# Patient Record
Sex: Male | Born: 1960 | Race: White | Hispanic: No | Marital: Single | Smoking: Former smoker
Health system: Southern US, Community
[De-identification: ages and names within clinical notes are randomized; demographics above are authoritative.]

## PROBLEM LIST (undated history)

## (undated) DIAGNOSIS — I209 Angina pectoris, unspecified: Secondary | ICD-10-CM

## (undated) DIAGNOSIS — F3289 Other specified depressive episodes: Secondary | ICD-10-CM

## (undated) DIAGNOSIS — M66329 Spontaneous rupture of flexor tendons, unspecified upper arm: Secondary | ICD-10-CM

## (undated) DIAGNOSIS — F1020 Alcohol dependence, uncomplicated: Secondary | ICD-10-CM

## (undated) DIAGNOSIS — K219 Gastro-esophageal reflux disease without esophagitis: Secondary | ICD-10-CM

## (undated) DIAGNOSIS — R519 Headache, unspecified: Secondary | ICD-10-CM

## (undated) DIAGNOSIS — J309 Allergic rhinitis, unspecified: Secondary | ICD-10-CM

## (undated) DIAGNOSIS — N179 Acute kidney failure, unspecified: Secondary | ICD-10-CM

## (undated) DIAGNOSIS — I2510 Atherosclerotic heart disease of native coronary artery without angina pectoris: Secondary | ICD-10-CM

## (undated) DIAGNOSIS — K7010 Alcoholic hepatitis without ascites: Secondary | ICD-10-CM

## (undated) DIAGNOSIS — D649 Anemia, unspecified: Secondary | ICD-10-CM

## (undated) DIAGNOSIS — Z9861 Coronary angioplasty status: Secondary | ICD-10-CM

## (undated) DIAGNOSIS — I10 Essential (primary) hypertension: Secondary | ICD-10-CM

## (undated) DIAGNOSIS — E785 Hyperlipidemia, unspecified: Secondary | ICD-10-CM

## (undated) DIAGNOSIS — M1990 Unspecified osteoarthritis, unspecified site: Secondary | ICD-10-CM

## (undated) DIAGNOSIS — I5032 Chronic diastolic (congestive) heart failure: Secondary | ICD-10-CM

## (undated) DIAGNOSIS — F411 Generalized anxiety disorder: Secondary | ICD-10-CM

## (undated) DIAGNOSIS — F329 Major depressive disorder, single episode, unspecified: Secondary | ICD-10-CM

## (undated) DIAGNOSIS — R7302 Impaired glucose tolerance (oral): Secondary | ICD-10-CM

## (undated) DIAGNOSIS — D126 Benign neoplasm of colon, unspecified: Secondary | ICD-10-CM

## (undated) HISTORY — DX: Benign neoplasm of colon, unspecified: D12.6

## (undated) HISTORY — DX: Essential (primary) hypertension: I10

## (undated) HISTORY — DX: Spontaneous rupture of flexor tendons, unspecified upper arm: M66.329

## (undated) HISTORY — DX: Major depressive disorder, single episode, unspecified: F32.9

## (undated) HISTORY — DX: Atherosclerotic heart disease of native coronary artery without angina pectoris: I25.10

## (undated) HISTORY — PX: MANDIBLE FRACTURE SURGERY: SHX706

## (undated) HISTORY — DX: Impaired glucose tolerance (oral): R73.02

## (undated) HISTORY — DX: Hyperlipidemia, unspecified: E78.5

## (undated) HISTORY — DX: Coronary angioplasty status: Z98.61

## (undated) HISTORY — DX: Allergic rhinitis, unspecified: J30.9

## (undated) HISTORY — DX: Other specified depressive episodes: F32.89

## (undated) HISTORY — DX: Unspecified osteoarthritis, unspecified site: M19.90

## (undated) HISTORY — DX: Generalized anxiety disorder: F41.1

## (undated) HISTORY — PX: CORONARY ANGIOPLASTY: SHX604

## (undated) HISTORY — PX: FOOT SURGERY: SHX648

## (undated) HISTORY — DX: Alcohol dependence, uncomplicated: F10.20

## (undated) HISTORY — DX: Gastro-esophageal reflux disease without esophagitis: K21.9

---
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
## 1998-10-31 ENCOUNTER — Ambulatory Visit (HOSPITAL_COMMUNITY): Admission: RE | Admit: 1998-10-31 | Discharge: 1998-10-31 | Payer: Self-pay | Admitting: Gastroenterology

## 1999-12-21 ENCOUNTER — Ambulatory Visit (HOSPITAL_COMMUNITY): Admission: RE | Admit: 1999-12-21 | Discharge: 1999-12-21 | Payer: Self-pay | Admitting: Internal Medicine

## 1999-12-21 ENCOUNTER — Encounter: Payer: Self-pay | Admitting: Internal Medicine

## 2004-08-24 ENCOUNTER — Ambulatory Visit: Payer: Self-pay | Admitting: Internal Medicine

## 2004-11-18 ENCOUNTER — Ambulatory Visit: Payer: Self-pay | Admitting: Internal Medicine

## 2004-11-26 ENCOUNTER — Ambulatory Visit: Payer: Self-pay | Admitting: Internal Medicine

## 2004-11-30 ENCOUNTER — Ambulatory Visit: Payer: Self-pay | Admitting: Internal Medicine

## 2006-04-11 ENCOUNTER — Ambulatory Visit: Payer: Self-pay | Admitting: Internal Medicine

## 2006-05-11 ENCOUNTER — Ambulatory Visit: Payer: Self-pay | Admitting: Gastroenterology

## 2006-05-26 ENCOUNTER — Ambulatory Visit: Payer: Self-pay | Admitting: Internal Medicine

## 2006-06-18 DIAGNOSIS — D126 Benign neoplasm of colon, unspecified: Secondary | ICD-10-CM

## 2006-06-18 HISTORY — DX: Benign neoplasm of colon, unspecified: D12.6

## 2006-06-20 ENCOUNTER — Encounter (INDEPENDENT_AMBULATORY_CARE_PROVIDER_SITE_OTHER): Payer: Self-pay | Admitting: Specialist

## 2006-06-20 ENCOUNTER — Ambulatory Visit: Payer: Self-pay | Admitting: Gastroenterology

## 2006-07-18 ENCOUNTER — Ambulatory Visit: Payer: Self-pay | Admitting: Internal Medicine

## 2006-07-18 LAB — CONVERTED CEMR LAB
ALT: 48 units/L — ABNORMAL HIGH (ref 0–40)
AST: 41 units/L — ABNORMAL HIGH (ref 0–37)
Albumin: 3.9 g/dL (ref 3.5–5.2)
Alkaline Phosphatase: 44 units/L (ref 39–117)
Bilirubin, Direct: 0.1 mg/dL (ref 0.0–0.3)
Chol/HDL Ratio, serum: 2.6
Cholesterol: 183 mg/dL (ref 0–200)
HDL: 70 mg/dL (ref >39.0)
LDL Cholesterol: 99 mg/dL (ref 0–99)
Total Bilirubin: 0.7 mg/dL (ref 0.3–1.2)
Total CK: 99 units/L (ref 7–195)
Total Protein: 7.2 g/dL (ref 6.0–8.3)
Triglyceride fasting, serum: 70 mg/dL (ref 0–149)
VLDL: 14 mg/dL (ref 0–40)

## 2007-02-17 ENCOUNTER — Ambulatory Visit: Payer: Self-pay | Admitting: Internal Medicine

## 2007-02-17 LAB — CONVERTED CEMR LAB
ALT: 60 units/L — ABNORMAL HIGH (ref 0–53)
AST: 45 units/L — ABNORMAL HIGH (ref 0–37)
Albumin: 4.3 g/dL (ref 3.5–5.2)
Alkaline Phosphatase: 40 units/L (ref 39–117)
BUN: 68 mg/dL — ABNORMAL HIGH (ref 6–23)
Basophils Absolute: 0 10*3/uL (ref 0.0–0.1)
Basophils Relative: 0.1 % (ref 0.0–1.0)
Bilirubin, Direct: 0.1 mg/dL (ref 0.0–0.3)
CO2: 27 meq/L (ref 19–32)
Calcium: 9.7 mg/dL (ref 8.4–10.5)
Chloride: 100 meq/L (ref 96–112)
Creatinine, Ser: 1.9 mg/dL — ABNORMAL HIGH (ref 0.4–1.5)
Eosinophils Absolute: 0.1 10*3/uL (ref 0.0–0.6)
Eosinophils Relative: 0.9 % (ref 0.0–5.0)
GFR calc Af Amer: 49 mL/min
GFR calc non Af Amer: 40 mL/min
Glucose, Bld: 106 mg/dL — ABNORMAL HIGH (ref 70–99)
H Pylori IgG: NEGATIVE
HCT: 33.8 % — ABNORMAL LOW (ref 39.0–52.0)
Hemoglobin: 12 g/dL — ABNORMAL LOW (ref 13.0–17.0)
Lymphocytes Relative: 14.5 % (ref 12.0–46.0)
MCHC: 35.4 g/dL (ref 30.0–36.0)
MCV: 98.5 fL (ref 78.0–100.0)
Monocytes Absolute: 0.7 10*3/uL (ref 0.2–0.7)
Monocytes Relative: 11.3 % — ABNORMAL HIGH (ref 3.0–11.0)
Neutro Abs: 4.6 10*3/uL (ref 1.4–7.7)
Neutrophils Relative %: 73.2 % (ref 43.0–77.0)
Platelets: 195 10*3/uL (ref 150–400)
Potassium: 4.7 meq/L (ref 3.5–5.1)
RBC: 3.43 M/uL — ABNORMAL LOW (ref 4.22–5.81)
RDW: 12.6 % (ref 11.5–14.6)
Sodium: 136 meq/L (ref 135–145)
Total Bilirubin: 0.8 mg/dL (ref 0.3–1.2)
Total Protein: 7.5 g/dL (ref 6.0–8.3)
WBC: 6.3 10*3/uL (ref 4.5–10.5)

## 2007-02-20 ENCOUNTER — Encounter: Payer: Self-pay | Admitting: Internal Medicine

## 2007-02-21 DIAGNOSIS — M545 Low back pain, unspecified: Secondary | ICD-10-CM | POA: Insufficient documentation

## 2007-02-21 DIAGNOSIS — J309 Allergic rhinitis, unspecified: Secondary | ICD-10-CM | POA: Insufficient documentation

## 2007-02-21 DIAGNOSIS — K219 Gastro-esophageal reflux disease without esophagitis: Secondary | ICD-10-CM | POA: Insufficient documentation

## 2007-02-21 DIAGNOSIS — K649 Unspecified hemorrhoids: Secondary | ICD-10-CM | POA: Insufficient documentation

## 2007-02-21 DIAGNOSIS — R519 Headache, unspecified: Secondary | ICD-10-CM | POA: Insufficient documentation

## 2007-02-21 DIAGNOSIS — Z860100 Personal history of colon polyps, unspecified: Secondary | ICD-10-CM | POA: Insufficient documentation

## 2007-02-21 DIAGNOSIS — M1990 Unspecified osteoarthritis, unspecified site: Secondary | ICD-10-CM | POA: Insufficient documentation

## 2007-02-21 DIAGNOSIS — R51 Headache: Secondary | ICD-10-CM

## 2007-02-21 DIAGNOSIS — F329 Major depressive disorder, single episode, unspecified: Secondary | ICD-10-CM | POA: Insufficient documentation

## 2007-02-21 DIAGNOSIS — F1020 Alcohol dependence, uncomplicated: Secondary | ICD-10-CM

## 2007-02-21 DIAGNOSIS — E785 Hyperlipidemia, unspecified: Secondary | ICD-10-CM

## 2007-02-21 DIAGNOSIS — I10 Essential (primary) hypertension: Secondary | ICD-10-CM

## 2007-11-16 ENCOUNTER — Ambulatory Visit: Payer: Self-pay | Admitting: Internal Medicine

## 2007-11-16 LAB — CONVERTED CEMR LAB
ALT: 50 units/L (ref 0–53)
AST: 37 units/L (ref 0–37)
Albumin: 4.2 g/dL (ref 3.5–5.2)
Alkaline Phosphatase: 48 units/L (ref 39–117)
BUN: 23 mg/dL (ref 6–23)
Basophils Absolute: 0 10*3/uL (ref 0.0–0.1)
Basophils Relative: 0.6 % (ref 0.0–1.0)
Bilirubin Urine: NEGATIVE
Bilirubin, Direct: 0.1 mg/dL (ref 0.0–0.3)
CO2: 33 meq/L — ABNORMAL HIGH (ref 19–32)
Calcium: 10 mg/dL (ref 8.4–10.5)
Chloride: 98 meq/L (ref 96–112)
Cholesterol: 220 mg/dL (ref 0–200)
Creatinine, Ser: 0.9 mg/dL (ref 0.4–1.5)
Direct LDL: 145.1 mg/dL
Eosinophils Absolute: 0.1 10*3/uL (ref 0.0–0.7)
Eosinophils Relative: 1.9 % (ref 0.0–5.0)
GFR calc Af Amer: 115 mL/min
GFR calc non Af Amer: 95 mL/min
Glucose, Bld: 104 mg/dL — ABNORMAL HIGH (ref 70–99)
HCT: 35.4 % — ABNORMAL LOW (ref 39.0–52.0)
HDL: 62.4 mg/dL (ref >39.0)
Hemoglobin, Urine: NEGATIVE
Hemoglobin: 12.4 g/dL — ABNORMAL LOW (ref 13.0–17.0)
Ketones, ur: NEGATIVE mg/dL
Leukocytes, UA: NEGATIVE
Lymphocytes Relative: 20.9 % (ref 12.0–46.0)
MCHC: 34.9 g/dL (ref 30.0–36.0)
MCV: 101.3 fL — ABNORMAL HIGH (ref 78.0–100.0)
Monocytes Absolute: 0.7 10*3/uL (ref 0.1–1.0)
Monocytes Relative: 10.3 % (ref 3.0–12.0)
Neutro Abs: 4.4 10*3/uL (ref 1.4–7.7)
Neutrophils Relative %: 66.3 % (ref 43.0–77.0)
Nitrite: NEGATIVE
PSA: 1.82 ng/mL (ref 0.10–4.00)
Platelets: 179 10*3/uL (ref 150–400)
Potassium: 4.2 meq/L (ref 3.5–5.1)
RBC: 3.5 M/uL — ABNORMAL LOW (ref 4.22–5.81)
RDW: 12.6 % (ref 11.5–14.6)
Sodium: 137 meq/L (ref 135–145)
Specific Gravity, Urine: 1.025 (ref 1.000–1.03)
TSH: 0.78 microintl units/mL (ref 0.35–5.50)
Total Bilirubin: 0.7 mg/dL (ref 0.3–1.2)
Total CHOL/HDL Ratio: 3.5
Total Protein, Urine: NEGATIVE mg/dL
Total Protein: 7.5 g/dL (ref 6.0–8.3)
Triglycerides: 81 mg/dL (ref 0–149)
Urine Glucose: NEGATIVE mg/dL
Urobilinogen, UA: 0.2 (ref 0.0–1.0)
VLDL: 16 mg/dL (ref 0–40)
WBC: 6.6 10*3/uL (ref 4.5–10.5)
pH: 5.5 (ref 5.0–8.0)

## 2008-12-23 ENCOUNTER — Telehealth: Payer: Self-pay | Admitting: Internal Medicine

## 2008-12-23 ENCOUNTER — Ambulatory Visit: Payer: Self-pay | Admitting: Internal Medicine

## 2008-12-24 LAB — CONVERTED CEMR LAB
ALT: 49 units/L (ref 0–53)
AST: 46 units/L — ABNORMAL HIGH (ref 0–37)
Albumin: 4.6 g/dL (ref 3.5–5.2)
Alkaline Phosphatase: 39 units/L (ref 39–117)
BUN: 37 mg/dL — ABNORMAL HIGH (ref 6–23)
Basophils Absolute: 0 10*3/uL (ref 0.0–0.1)
Basophils Relative: 0.1 % (ref 0.0–3.0)
Bilirubin Urine: NEGATIVE
Bilirubin, Direct: 0.1 mg/dL (ref 0.0–0.3)
CO2: 26 meq/L (ref 19–32)
Calcium: 9.6 mg/dL (ref 8.4–10.5)
Chloride: 109 meq/L (ref 96–112)
Cholesterol: 194 mg/dL (ref 0–200)
Creatinine, Ser: 1 mg/dL (ref 0.4–1.5)
Eosinophils Absolute: 0.1 10*3/uL (ref 0.0–0.7)
Eosinophils Relative: 1 % (ref 0.0–5.0)
GFR calc non Af Amer: 84.03 mL/min (ref >60)
Glucose, Bld: 100 mg/dL — ABNORMAL HIGH (ref 70–99)
HCT: 37.4 % — ABNORMAL LOW (ref 39.0–52.0)
HDL: 94.3 mg/dL (ref >39.00)
Hemoglobin, Urine: NEGATIVE
Hemoglobin: 13 g/dL (ref 13.0–17.0)
Ketones, ur: NEGATIVE mg/dL
LDL Cholesterol: 93 mg/dL (ref 0–99)
Leukocytes, UA: NEGATIVE
Lymphocytes Relative: 14.3 % (ref 12.0–46.0)
Lymphs Abs: 0.9 10*3/uL (ref 0.7–4.0)
MCHC: 34.8 g/dL (ref 30.0–36.0)
MCV: 101.9 fL — ABNORMAL HIGH (ref 78.0–100.0)
Monocytes Absolute: 0.5 10*3/uL (ref 0.1–1.0)
Monocytes Relative: 7.5 % (ref 3.0–12.0)
Neutro Abs: 4.8 10*3/uL (ref 1.4–7.7)
Neutrophils Relative %: 77.1 % — ABNORMAL HIGH (ref 43.0–77.0)
Nitrite: NEGATIVE
PSA: 2.43 ng/mL (ref 0.10–4.00)
Platelets: 151 10*3/uL (ref 150.0–400.0)
Potassium: 4.3 meq/L (ref 3.5–5.1)
RBC: 3.67 M/uL — ABNORMAL LOW (ref 4.22–5.81)
RDW: 12.7 % (ref 11.5–14.6)
Sodium: 141 meq/L (ref 135–145)
Specific Gravity, Urine: >=1.03 (ref 1.000–1.030)
TSH: 0.9 microintl units/mL (ref 0.35–5.50)
Total Bilirubin: 0.9 mg/dL (ref 0.3–1.2)
Total CHOL/HDL Ratio: 2
Total Protein: 8.1 g/dL (ref 6.0–8.3)
Triglycerides: 36 mg/dL (ref 0.0–149.0)
Urine Glucose: NEGATIVE mg/dL
Urobilinogen, UA: 0.2 (ref 0.0–1.0)
VLDL: 7.2 mg/dL (ref 0.0–40.0)
WBC: 6.3 10*3/uL (ref 4.5–10.5)
pH: 5.5 (ref 5.0–8.0)

## 2009-04-24 ENCOUNTER — Ambulatory Visit: Payer: Self-pay | Admitting: Internal Medicine

## 2009-04-24 DIAGNOSIS — J069 Acute upper respiratory infection, unspecified: Secondary | ICD-10-CM | POA: Insufficient documentation

## 2009-04-24 DIAGNOSIS — T783XXA Angioneurotic edema, initial encounter: Secondary | ICD-10-CM | POA: Insufficient documentation

## 2009-04-25 ENCOUNTER — Telehealth: Payer: Self-pay | Admitting: Internal Medicine

## 2009-10-30 ENCOUNTER — Ambulatory Visit: Payer: Self-pay | Admitting: Internal Medicine

## 2009-10-30 ENCOUNTER — Encounter (INDEPENDENT_AMBULATORY_CARE_PROVIDER_SITE_OTHER): Payer: Self-pay | Admitting: *Deleted

## 2009-10-30 DIAGNOSIS — K5289 Other specified noninfective gastroenteritis and colitis: Secondary | ICD-10-CM

## 2009-10-30 DIAGNOSIS — F411 Generalized anxiety disorder: Secondary | ICD-10-CM

## 2010-02-18 ENCOUNTER — Ambulatory Visit: Payer: Self-pay | Admitting: Internal Medicine

## 2010-02-19 LAB — CONVERTED CEMR LAB
ALT: 29 units/L (ref 0–53)
AST: 31 units/L (ref 0–37)
Albumin: 4.1 g/dL (ref 3.5–5.2)
Alkaline Phosphatase: 48 units/L (ref 39–117)
Ammonia: 29 umol/L (ref 11–35)
BUN: 21 mg/dL (ref 6–23)
Basophils Absolute: 0 10*3/uL (ref 0.0–0.1)
Basophils Relative: 0.5 % (ref 0.0–3.0)
Bilirubin Urine: NEGATIVE
Bilirubin, Direct: 0.1 mg/dL (ref 0.0–0.3)
CO2: 30 meq/L (ref 19–32)
Calcium: 9.8 mg/dL (ref 8.4–10.5)
Chloride: 94 meq/L — ABNORMAL LOW (ref 96–112)
Cholesterol: 201 mg/dL — ABNORMAL HIGH (ref 0–200)
Creatinine, Ser: 1.1 mg/dL (ref 0.4–1.5)
Direct LDL: 114.3 mg/dL
Eosinophils Absolute: 0.1 10*3/uL (ref 0.0–0.7)
Eosinophils Relative: 0.9 % (ref 0.0–5.0)
GFR calc non Af Amer: 79.06 mL/min (ref >60)
Glucose, Bld: 133 mg/dL — ABNORMAL HIGH (ref 70–99)
HCT: 40.8 % (ref 39.0–52.0)
HDL: 79.9 mg/dL (ref >39.00)
Hemoglobin, Urine: NEGATIVE
Hemoglobin: 14.3 g/dL (ref 13.0–17.0)
Hgb A1c MFr Bld: 5.9 % (ref 4.6–6.5)
Leukocytes, UA: NEGATIVE
Lymphocytes Relative: 16.9 % (ref 12.0–46.0)
Lymphs Abs: 1.1 10*3/uL (ref 0.7–4.0)
MCHC: 34.9 g/dL (ref 30.0–36.0)
MCV: 99.1 fL (ref 78.0–100.0)
Monocytes Absolute: 0.8 10*3/uL (ref 0.1–1.0)
Monocytes Relative: 11.9 % (ref 3.0–12.0)
Neutro Abs: 4.6 10*3/uL (ref 1.4–7.7)
Neutrophils Relative %: 69.8 % (ref 43.0–77.0)
Nitrite: NEGATIVE
PSA: 2.18 ng/mL (ref 0.10–4.00)
Platelets: 145 10*3/uL — ABNORMAL LOW (ref 150.0–400.0)
Potassium: 3.4 meq/L — ABNORMAL LOW (ref 3.5–5.1)
RBC: 4.12 M/uL — ABNORMAL LOW (ref 4.22–5.81)
RDW: 13.4 % (ref 11.5–14.6)
Sodium: 138 meq/L (ref 135–145)
Specific Gravity, Urine: 1.02 (ref 1.000–1.030)
TSH: 0.84 microintl units/mL (ref 0.35–5.50)
Total Bilirubin: 0.7 mg/dL (ref 0.3–1.2)
Total CHOL/HDL Ratio: 3
Total Protein, Urine: NEGATIVE mg/dL
Total Protein: 7.4 g/dL (ref 6.0–8.3)
Triglycerides: 76 mg/dL (ref 0.0–149.0)
Urine Glucose: NEGATIVE mg/dL
Urobilinogen, UA: 0.2 (ref 0.0–1.0)
VLDL: 15.2 mg/dL (ref 0.0–40.0)
WBC: 6.6 10*3/uL (ref 4.5–10.5)
pH: 6.5 (ref 5.0–8.0)

## 2010-06-01 ENCOUNTER — Ambulatory Visit: Payer: Self-pay | Admitting: Internal Medicine

## 2010-06-01 DIAGNOSIS — M66329 Spontaneous rupture of flexor tendons, unspecified upper arm: Secondary | ICD-10-CM | POA: Insufficient documentation

## 2010-06-03 ENCOUNTER — Telehealth: Payer: Self-pay | Admitting: Internal Medicine

## 2010-08-18 NOTE — Assessment & Plan Note (Signed)
Summary: per d/t---fu---stc   Vital Signs:  Patient profile:   50 year old male Height:      67 inches Weight:      178 pounds BMI:     27.98 O2 Sat:      94 % on Room air Temp:     98.7 degrees F oral Pulse rate:   115 / minute BP sitting:   148 / 82  (left arm) Cuff size:   regular  Vitals Entered By: Zella Ball Ewing CMA (AAMA) (February 18, 2010 3:30 PM)  O2 Flow:  Room air  CC: Followup/RE   CC:  Followup/RE.  History of Present Illness: overall doing well, no specific complaints,  Pt denies CP, sob, doe, wheezing, orthopnea, pnd, worsening LE edema, palps, dizziness or syncope Pt denies new neuro symptoms such as headache, facial or extremity weakness  No fever, wt loss, night sweats, loss of appetite or other constitutional symptoms  Overall good compliacne, good tolerability.  Problems Prior to Update: 1)  Anxiety  (ICD-300.00) 2)  Gastroenteritis, Acute  (ICD-558.9) 3)  Angioedema  (ICD-995.1) 4)  Uri  (ICD-465.9) 5)  Preventive Health Care  (ICD-V70.0) 6)  Preventive Health Care  (ICD-V70.0) 7)  Colonic Polyps, Hx of  (ICD-V12.72) 8)  Depression  (ICD-311) 9)  Osteoarthritis  (ICD-715.90) 10)  Low Back Pain  (ICD-724.2) 11)  Headache  (ICD-784.0) 12)  Allergic Rhinitis  (ICD-477.9) 13)  Hemorrhoids  (ICD-455.6) 14)  Dependence, Alcohol Nec/nos, Unspecified  (ICD-303.90) 15)  Gerd  (ICD-530.81) 16)  Hypertension  (ICD-401.9) 17)  Hyperlipidemia  (ICD-272.4)  Medications Prior to Update: 1)  Fluoxetine Hcl 40 Mg Caps (Fluoxetine Hcl) .Marland Kitchen.. 1 By Mouth Once Daily 2)  Losartan Potassium-Hctz 100-25 Mg Tabs (Losartan Potassium-Hctz) .Marland Kitchen.. 1 By Mouth Once Daily 3)  Omeprazole 20 Mg Cpdr (Omeprazole) .Marland Kitchen.. 1 By Mouth Once Daily 4)  Simvastatin 80 Mg Tabs (Simvastatin) .... Take 1 Tablet By Mouth Once A Day 5)  Adult Aspirin Ec Low Strength 81 Mg  Tbec (Aspirin) .Marland Kitchen.. 1po Qd 6)  Promethazine Hcl 25 Mg Tabs (Promethazine Hcl) .Marland Kitchen.. 1 By Mouth Q 6 Hrs As Needed Nausea 7)   Lomotil 2.5-0.025 Mg Tabs (Diphenoxylate-Atropine) .Marland Kitchen.. 1po As Needed Loose Stool  - Max 8 Tabs Per 24 Hours  Current Medications (verified): 1)  Fluoxetine Hcl 40 Mg Caps (Fluoxetine Hcl) .Marland Kitchen.. 1 By Mouth Once Daily 2)  Losartan Potassium-Hctz 100-25 Mg Tabs (Losartan Potassium-Hctz) .Marland Kitchen.. 1 By Mouth Once Daily 3)  Omeprazole 20 Mg Cpdr (Omeprazole) .Marland Kitchen.. 1 By Mouth Once Daily 4)  Simvastatin 40 Mg Tabs (Simvastatin) .Marland Kitchen.. 1po Once Daily 5)  Adult Aspirin Ec Low Strength 81 Mg  Tbec (Aspirin) .Marland Kitchen.. 1po Qd  Allergies (verified): 1)  ! 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         all otherwise negative per pt -  except occasional left earache occasional, and loose stools intermittent every month or so; BP at work has been < 140/90  Physical Exam  Head:  normocephalic and atraumatic.   Eyes:  vision grossly intact, pupils equal, and pupils round.   Ears:  R ear normal and L ear normal.   Nose:  no external deformity and no nasal discharge.   Mouth:  no gingival abnormalities and pharynx pink and moist.   Neck:  supple and no masses.   Lungs:  normal respiratory effort and normal breath sounds.   Heart:  normal rate and regular rhythm.   Abdomen:  soft, non-tender, and normal bowel sounds.   Msk:  no joint tenderness and no joint swelling.   Extremities:  no edema, no erythema  Neurologic:  cranial nerves II-XII intact and strength normal in all extremities.   Skin:  color normal and no rashes.   Psych:  memory intact for recent and remote and normally interactive.     Impression & Recommendations:  Problem # 1:  Preventive Health Care (ICD-V70.0)  Overall doing well, age appropriate education and counseling updated and referral for appropriate preventive services done unless declined, immunizations up to date or declined, diet counseling done if overweight, urged to quit smoking if smokes , most recent labs reviewed and current ordered if appropriate, ecg reviewed or declined (interpretation per ECG scanned in the EMR if done); information regarding Medicare Prevention requirements given if appropriate; speciality referrals updated as appropriate   Orders: TLB-BMP (Basic Metabolic Panel-BMET) (80048-METABOL) TLB-CBC Platelet - w/Differential (85025-CBCD) TLB-Hepatic/Liver Function Pnl (80076-HEPATIC) TLB-TSH (Thyroid Stimulating Hormone) (84443-TSH) TLB-Lipid Panel (80061-LIPID) TLB-PSA (Prostate Specific Antigen) (84153-PSA) TLB-Udip ONLY (81003-UDIP)  Problem # 2:  HYPERTENSION  (ICD-401.9)  His updated medication list for this problem includes:    Losartan Potassium-hctz 100-25 Mg Tabs (Losartan potassium-hctz) .Marland Kitchen... 1 by mouth once daily  BP today: 148/82 Prior BP: 152/90 (10/30/2009)  Labs Reviewed: K+: 4.3 (12/23/2008) Creat: : 1.0 (12/23/2008)   Chol: 194 (12/23/2008)   HDL: 94.30 (12/23/2008)   LDL: 93 (12/23/2008)   TG: 36.0 (12/23/2008) BP overall stable overall by hx and exam, ok to continue meds/tx as is   Complete Medication List: 1)  Fluoxetine Hcl 40 Mg Caps (Fluoxetine hcl) .Marland Kitchen.. 1 by mouth once daily 2)  Losartan Potassium-hctz 100-25 Mg Tabs (Losartan potassium-hctz) .Marland Kitchen.. 1 by mouth once daily 3)  Omeprazole 20 Mg Cpdr (Omeprazole) .Marland Kitchen.. 1 by mouth once daily 4)  Simvastatin 40 Mg Tabs (Simvastatin) .Marland Kitchen.. 1po once daily 5)  Adult Aspirin Ec Low Strength 81 Mg Tbec (Aspirin) .Marland Kitchen.. 1po qd  Other Orders: TLB-Ammonia (82140-AMON)  Patient Instructions: 1)  Continue all previous medications as before this visit , except decrease the simvastatin to 40 mg per day 2)  Please go to the Lab in the basement for your blood and/or urine tests today  3)  Please schedule a follow-up appointment in 1 year. 4)  Check your Blood Pressure regularly. If it  is above 140/90: you should make an appointment. Prescriptions: SIMVASTATIN 40 MG TABS (SIMVASTATIN) 1po once daily  #90 x 3   Entered and Authorized by:   Corwin Levins MD   Signed by:   Corwin Levins MD on 02/18/2010   Method used:   Print then Give to Patient   RxID:   [id number] SIMVASTATIN 80 MG TABS (SIMVASTATIN) Take 1 tablet by mouth once a day  #90 x 3   Entered and Authorized by:   Corwin Levins MD   Signed by:   Corwin Levins MD on 02/18/2010   Method used:   Print then Give to Patient   RxID:   [id number] OMEPRAZOLE 20 MG CPDR (OMEPRAZOLE) 1 by mouth once daily  #90 x 3   Entered and Authorized by:   Corwin Levins MD   Signed by:   Corwin Levins MD on 02/18/2010   Method used:   Print  then Give to Patient   RxID:   [id number] POTASSIUM-HCTZ 100-25 MG TABS (LOSARTAN POTASSIUM-HCTZ) 1 by mouth once daily  #90 x 3   Entered and Authorized by:   Corwin Levins MD   Signed by:   Corwin Levins MD on 02/18/2010   Method used:   Print then Give to Patient   RxID:   [id number] FLUOXETINE HCL 40 MG CAPS (FLUOXETINE HCL) 1 by mouth once daily  #90 x 3   Entered and Authorized by:   Corwin Levins MD   Signed by:   Corwin Levins MD on 02/18/2010   Method used:   Print then Give to Patient   RxID:   [id number]

## 2010-08-18 NOTE — Progress Notes (Signed)
Summary: Ortho referral  Phone Note Outgoing Call   Summary of Call: Dr Jonny Ruiz called pt to inform him of appt 06/04/10 @ 9am Dr Thomasena Edis PA ([facility]) , pt said his company sent him to see Dr Ardith Dark because its workman comp. Initial call taken by: Dagoberto Reef,  June 03, 2010 1:56 PM  Follow-up for Phone Call        noted Follow-up by: Corwin Levins MD,  June 03, 2010 2:57 PM

## 2010-08-18 NOTE — Assessment & Plan Note (Signed)
Summary: LIGHTHEADED/ DIARRHEA/ FATIGUE/NWS   Vital Signs:  Patient profile:   50 year old male Height:      67 inches Weight:      178.50 pounds BMI:     28.06 O2 Sat:      94 % on Room air Temp:     98.5 degrees F oral Pulse rate:   93 / minute BP sitting:   152 / 90  (left arm) Cuff size:   regular  Vitals Entered ByZella Ball Ewing (October 30, 2009 2:34 PM)  O2 Flow:  Room air  CC: light headed, diarrhea, fatigue for 3 days/RE   CC:  light headed, diarrhea, and fatigue for 3 days/RE.  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  /Pt denies CP, sob, doe, wheezing, orthopnea, pnd, worsening LE edema, palps, dizziness or syncope  Pt denies new neuro symptoms such as headache, facial or extremity weakness .  Not dizzy or orthostatic today  Problems Prior to Update: 1)  Anxiety  (ICD-300.00) 2)  Gastroenteritis, Acute  (ICD-558.9) 3)  Angioedema  (ICD-995.1) 4)  Uri  (ICD-465.9) 5)  Preventive Health Care  (ICD-V70.0) 6)  Preventive Health Care  (ICD-V70.0) 7)  Colonic Polyps, Hx of  (ICD-V12.72) 8)  Depression  (ICD-311) 9)  Osteoarthritis  (ICD-715.90) 10)  Low Back Pain  (ICD-724.2) 11)  Headache  (ICD-784.0) 12)  Allergic Rhinitis  (ICD-477.9) 13)  Hemorrhoids  (ICD-455.6) 14)  Dependence, Alcohol Nec/nos, Unspecified  (ICD-303.90) 15)  Gerd  (ICD-530.81) 16)  Hypertension  (ICD-401.9) 17)  Hyperlipidemia  (ICD-272.4)  Medications Prior to Update: 1)  Fluoxetine Hcl 20 Mg Caps  (Fluoxetine Hcl) .... Take 1 Capsule By Mouth Once A Day 2)  Losartan Potassium-Hctz 100-25 Mg Tabs (Losartan Potassium-Hctz) .Marland Kitchen.. 1 By Mouth Once Daily 3)  Omeprazole 20 Mg Cpdr (Omeprazole) .Marland Kitchen.. 1 By Mouth Once Daily 4)  Simvastatin 80 Mg Tabs (Simvastatin) .... Take 1 Tablet By Mouth Once A Day 5)  Adult Aspirin Ec Low Strength 81 Mg  Tbec (Aspirin) .Marland Kitchen.. 1po Qd 6)  Azithromycin 250 Mg Tabs (Azithromycin) .... 2po Qd For 1 Day, Then 1po Qd For 4days, Then Stop 7)  Prednisone 10 Mg Tabs (Prednisone) .... 3po Qd For 3days, Then 2po Qd For 3days, Then 1po Qd For 3days, Then Stop  Current Medications (verified): 1)  Fluoxetine Hcl 40 Mg Caps (Fluoxetine Hcl) .Marland Kitchen.. 1 By Mouth Once Daily 2)  Losartan Potassium-Hctz 100-25 Mg Tabs (Losartan Potassium-Hctz) .Marland Kitchen.. 1 By Mouth Once Daily 3)  Omeprazole 20 Mg Cpdr (Omeprazole) .Marland Kitchen.. 1 By Mouth Once Daily 4)  Simvastatin 80 Mg Tabs (Simvastatin) .... Take 1 Tablet By Mouth Once A Day 5)  Adult Aspirin Ec Low Strength 81 Mg  Tbec (Aspirin) .Marland Kitchen.. 1po Qd 6)  Promethazine Hcl 25 Mg Tabs (Promethazine Hcl) .Marland Kitchen.. 1 By Mouth Q 6 Hrs As Needed Nausea 7)  Lomotil 2.5-0.025 Mg Tabs (Diphenoxylate-Atropine) .Marland Kitchen.. 1po As Needed Loose Stool  - Max 8 Tabs Per  24 Hours  Allergies (verified): 1)  ! 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   Eyes:  vision grossly intact, pupils equal, and pupils round.   Ears:  bilat tm's mild red, sinus nontender Nose:  nasal dischargemucosal pallor and mucosal edema.   Mouth:  pharyngeal erythema and fair dentition.   Neck:  supple and no masses.   Lungs:  normal respiratory effort and normal breath sounds.   Heart:  normal rate and regular rhythm.   Abdomen:  soft and normal bowel sounds., diffuse mild tender without guarding or rebounds Msk:  no joint tenderness and no joint swelling.   Extremities:  no edema, no erythema  Skin:  color normal and no rashes.   Psych:  not depressed appearing and moderately anxious.     Impression & Recommendations:  Problem # 1:  GASTROENTERITIS, ACUTE (ICD-558.9) mild to mod, c/w  viral illness,  for antinausea med and lomotl as needed, push fluids, tylenol as needed   Problem # 2:  ANXIETY (ICD-300.00)  His updated medication list for this problem includes:    Fluoxetine Hcl 40 Mg Caps (Fluoxetine hcl) .Marland Kitchen... 1 by mouth once daily to increase the prozac as above, f/u next visit or 4 wks if not improved, sooner if wore  Problem # 3:  HYPERTENSION (ICD-401.9)  His updated medication list for this problem includes:    Losartan Potassium-hctz 100-25 Mg Tabs (Losartan potassium-hctz) .Marland Kitchen... 1 by mouth once daily  BP today: 152/90 Prior BP: 132/68 (04/24/2009)  Labs Reviewed: K+: 4.3 (12/23/2008) Creat: : 1.0 (12/23/2008)   Chol: 194 (12/23/2008)   HDL: 94.30 (12/23/2008)   LDL: 93 (12/23/2008)   TG: 36.0 (12/23/2008) mild elev today, likely situational, ok to follow, continue same treatment   Complete Medication List: 1)  Fluoxetine Hcl 40 Mg Caps (Fluoxetine hcl) .Marland Kitchen.. 1 by mouth once daily 2)  Losartan Potassium-hctz 100-25 Mg Tabs (Losartan potassium-hctz) .Marland Kitchen.. 1 by mouth once daily 3)  Omeprazole 20 Mg Cpdr (Omeprazole) .Marland Kitchen.. 1 by mouth once daily 4)  Simvastatin 80 Mg Tabs  (Simvastatin) .... Take 1 tablet by mouth once a day 5)  Adult Aspirin Ec Low Strength 81 Mg Tbec (Aspirin) .Marland Kitchen.. 1po qd 6)  Promethazine Hcl 25 Mg Tabs (Promethazine hcl) .Marland Kitchen.. 1 by mouth q 6 hrs as needed nausea 7)  Lomotil 2.5-0.025 Mg Tabs (Diphenoxylate-atropine) .Marland Kitchen.. 1po as needed loose stool  - max 8 tabs per 24 hours  Patient Instructions: 1)  Please take all new medications as prescribed 2)  Continue all previous medications as before this visit, except increase the fluoxetine to 40 mg per day  3)  all of your medications were sent to the pharmacy except for the lomotil 4)  drink lots of fluids 5)  Please schedule a follow-up appointment in 2 months with CPX labs Prescriptions: LOMOTIL 2.5-0.025 MG  TABS (DIPHENOXYLATE-ATROPINE) 1po as needed loose stool  - max 8 tabs per 24 hours  #50 x 0   Entered and Authorized by:   Corwin Levins MD   Signed by:   Corwin Levins MD on 10/30/2009   Method used:   Print then Give to Patient   RxID:   [id number] PROMETHAZINE HCL 25 MG TABS (PROMETHAZINE HCL) 1 by mouth q 6 hrs as needed nausea  #50 x 1   Entered and Authorized by:   Corwin Levins MD   Signed by:   Corwin Levins MD on 10/30/2009   Method used:   Electronically to        [facility] [address] (retail)       [phone]       [place], [place]  [address]       Ph: [phone]       Fax: [phone]   RxID:   [id number] FLUOXETINE HCL 40 MG CAPS (FLUOXETINE HCL) 1 by mouth once daily  #90 x 3   Entered and Authorized by:   Corwin Levins MD   Signed by:   Corwin Levins MD on 10/30/2009   Method used:   Electronically to        [facility] [address] (retail)       [phone]       [place], [place]  [address]       Ph: [phone]       Fax: [phone]   RxID:   [id number]

## 2010-08-18 NOTE — Letter (Signed)
Summary: Out of Work  [facility]  [address] [place], [place] [address]   Phone: [phone]  Fax: [phone]    October 30, 2009   Employee:  THANIEL COLUCCIO    To Whom It May Concern:   For Medical reasons, please excuse the above named employee from work for the following dates:  Start:   10/28/2009  End:   10/30/2009  If you need additional information, please feel free to contact our office.         Sincerely,    Dr. Oliver Barre

## 2010-08-18 NOTE — Letter (Signed)
Summary: Out of Work  [facility]  [address] [place], [place] [address]   Phone: [phone]  Fax: [phone]    June 01, 2010   Employee:  BYAN POPLASKI    To Whom It May Concern:   For Medical reasons, please excuse the above named employee from work for the following dates:  Start:   Jun 01, 2010  End:   Jun 10, 2010 -  to return to work Jun 11, 2010  If you need additional information, please feel free to contact our office.         Sincerely,    Corwin Levins MD

## 2010-08-18 NOTE — Assessment & Plan Note (Signed)
Summary: BLACK AND BLUE HURT ARM TURNING MATTRESS-STC   Vital Signs:  Patient profile:   50 year old male Height:      67 inches Weight:      183.25 pounds BMI:     28.80 O2 Sat:      95 % on Room air Temp:     98.2 degrees F oral Pulse rate:   77 / minute BP sitting:   164 / 92  (left arm) Cuff size:   regular  Vitals Entered By: Zella Ball Ewing CMA Duncan Dull) (June 01, 2010 2:38 PM)  O2 Flow:  Room air  CC: Right arm pain/RE   CC:  Right arm pain/RE.  History of Present Illness: here with acute onset pain to the right shoudler and bicep swelling, starting with a popping noise near the shoudler on turning over a matterss at home; then shortly afterwards with persistnet mild to mod ant upper arm pain with very large bruising.  Shoulder without significant pain on ROM now but c/o some weakness, and unable to go to work since his job involves lifitng constantly, and no light duty available.  Pt denies CP, worsening sob, doe, wheezing, orthopnea, pnd, worsening LE edema, palps, dizziness or syncope  Pt denies new neuro symptoms such as headache, facial or extremity weakness  No fever, wt loss, night sweats, loss of appetite or other constitutional symptoms  Denies worsening depressive symptoms, suicidal ideation, or panic.  Overall good complaicne with meds, and good tolerance.  Preventive Screening-Counseling & Management      Drug Use:  no.    Problems Prior to Update: 1)  Biceps Tendon Rupture, Right  (ICD-727.62) 2)  Anxiety  (ICD-300.00) 3)  Gastroenteritis, Acute  (ICD-558.9) 4)  Angioedema  (ICD-995.1) 5)  Uri  (ICD-465.9) 6)  Preventive Health Care  (ICD-V70.0) 7)  Preventive Health Care  (ICD-V70.0) 8)  Colonic Polyps, Hx of  (ICD-V12.72) 9)  Depression  (ICD-311) 10)  Osteoarthritis  (ICD-715.90) 11)  Low Back Pain  (ICD-724.2) 12)  Headache  (ICD-784.0) 13)  Allergic Rhinitis  (ICD-477.9) 14)  Hemorrhoids  (ICD-455.6) 15)  Dependence, Alcohol Nec/nos, Unspecified   (ICD-303.90) 16)  Gerd  (ICD-530.81) 17)  Hypertension  (ICD-401.9) 18)  Hyperlipidemia  (ICD-272.4)  Medications Prior to Update: 1)  Fluoxetine Hcl 40 Mg Caps (Fluoxetine Hcl) .Marland Kitchen.. 1 By Mouth Once Daily 2)  Losartan Potassium-Hctz 100-25 Mg Tabs (Losartan Potassium-Hctz) .Marland Kitchen.. 1 By Mouth Once Daily 3)  Omeprazole 20 Mg Cpdr (Omeprazole) .Marland Kitchen.. 1 By Mouth Once Daily 4)  Simvastatin 40 Mg Tabs (Simvastatin) .Marland Kitchen.. 1po Once Daily 5)  Adult Aspirin Ec Low Strength 81 Mg  Tbec (Aspirin) .Marland Kitchen.. 1po Qd  Current Medications (verified): 1)  Fluoxetine Hcl 40 Mg Caps (Fluoxetine Hcl) .Marland Kitchen.. 1 By Mouth Once Daily 2)  Losartan Potassium-Hctz 100-25 Mg Tabs (Losartan Potassium-Hctz) .Marland Kitchen.. 1 By Mouth Once Daily 3)  Omeprazole 20 Mg Cpdr (Omeprazole) .Marland Kitchen.. 1 By Mouth Once Daily 4)  Simvastatin 40 Mg Tabs (Simvastatin) .Marland Kitchen.. 1po Once Daily 5)  Adult Aspirin Ec Low Strength 81 Mg  Tbec (Aspirin) .Marland Kitchen.. 1po Qd  Allergies (verified): 1)  ! 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   Head:   normocephalic and atraumatic.   Eyes:  vision grossly intact, pupils equal, and pupils round.   Ears:  R ear normal and L ear normal.   Nose:  no external deformity and no nasal discharge.   Mouth:  no gingival abnormalities and pharynx pink and moist.   Neck:  supple and no masses.   Lungs:  normal respiratory effort and normal breath sounds.   Heart:  normal rate and regular rhythm.   Msk:  right shoudler nontender with FROM; some bicep insertion site tender but has unusual right bicep area swollen adn very large echymotic area covering nearly the whole medial arm from shoulder to axilla Extremities:  no edema, no erythema    Impression & Recommendations:  Problem # 1:  BICEPS TENDON RUPTURE, RIGHT (ICD-727.62) pain tolerable per pt;  for referral to ortho for furhter eval and tx, may need MRI arm but will hold for now;  no lifting at work - gave note for off work for 10 days Orders: Investment banker, operational Referral Pharmacist, hospital)  Problem # 2:  HYPERTENSION (ICD-401.9)  His updated medication list for this problem includes:    Losartan Potassium-hctz 100-25 Mg Tabs (Losartan potassium-hctz) .Marland Kitchen... 1 by mouth once daily  BP today: 164/92 Prior BP: 148/82 (02/18/2010)  Labs Reviewed: K+: 3.4 (02/18/2010) Creat: : 1.1 (02/18/2010)   Chol: 201 (02/18/2010)   HDL: 79.90 (02/18/2010)   LDL: 93 (12/23/2008)   TG: 76.0 (02/18/2010) mild elev today, likely situational, ok to follow, continue same treatment   Problem # 3:  ANXIETY (ICD-300.00)  His updated medication list for this problem includes:    Fluoxetine Hcl 40 Mg Caps (Fluoxetine hcl) .Marland Kitchen... 1 by mouth once daily stable overall by hx and exam, ok to continue meds/tx as is   Complete Medication List: 1)  Fluoxetine Hcl 40 Mg Caps (Fluoxetine hcl) .Marland Kitchen.. 1 by mouth once daily 2)  Losartan Potassium-hctz 100-25 Mg Tabs (Losartan potassium-hctz) .Marland Kitchen.. 1 by mouth once daily 3)  Omeprazole 20 Mg Cpdr (Omeprazole) .Marland Kitchen.. 1 by mouth once  daily 4)  Simvastatin 40 Mg Tabs (Simvastatin) .Marland Kitchen.. 1po once daily 5)  Adult Aspirin Ec Low Strength 81 Mg Tbec (Aspirin) .Marland Kitchen.. 1po qd  Patient Instructions: 1)  You will be contacted about the referral(s) to: orthopedic 2)  Continue all previous medications as before this visit 3)  you are given the note for off work for 10 days 4)  Please schedule a follow-up appointment in August 2012 with CPX and labs 5)  Remember you will probably need a new out of work note per orhtopedice if you are supposed to be out of work longer than 10 days   Orders Added: 1)  Orthopedic Surgeon Referral [Ortho Surgeon] 2)  Est. Patient Level IV [16109]

## 2010-12-04 NOTE — Assessment & Plan Note (Signed)
[facility]                           GASTROENTEROLOGY OFFICE NOTE   Richard Calhoun, Richard Calhoun                        MRN:          [id number]  DATE:05/11/2006                            DOB:          [birth year]    REFERRING PHYSICIAN:  Corwin Levins, MD   REASON FOR REFERRAL:  Hematochezia and rectal pain.   HISTORY OF PRESENT ILLNESS:  [name] is a 50 year old white male referred  from the courtesy of Dr. Oliver Barre.  I have previously evaluated him and he  underwent colonoscopy in July 2001 for rectal bleeding.  Internal and  external hemorrhoids were noted and his internal hemorrhoids were injected  for destruction.  He states he has done quite well with no colorectal  complaints up until about the past 2 to 3 weeks.  He states he was on  antibiotics which led to diarrhea and then he noted small amounts of rectal  bleeding with rectal pain associated with bowel movements.  His symptoms  have since then improved.  He has had no change in stool caliber, abdominal  pain or weight loss.  He does have problems with reflux but his symptoms are  well controlled on Nexium.  He notes no dysphagia or odynophagia.  There is  no family history of colon cancer, colon polyps or inflammatory bowel  disease.   PAST MEDICAL HISTORY:  1. Hypertension.  2. Hyperlipidemia.  3. Internal and external hemorrhoids.  4. Arthritis.  5. Allergies.  6. History of recurrent lower back pain.  7. GERD.  8. History of foot surgery.  9. History of jaw surgery.   ALLERGIES:  PENICILLIN.   MEDICATIONS:  Listed in the chart and they were reviewed.   SOCIAL HISTORY:  Per the handwritten form.   REVIEW OF SYSTEMS:  Per the handwritten form.   PHYSICAL EXAM:  No acute distress. Height 5 feet 7 inches.  Weight 182  pounds.  Blood pressure is 100/54.  Pulse 100 and regular.  HEENT:  Anicteric sclerae.  Oropharynx clear.  CHEST:  Clear to auscultation bilaterally.  CARDIAC:  Regular rate and rhythm without murmur.  ABDOMEN:  Soft, nontender, nondistended.  Normoactive bowel sounds.  No  palpable organomegaly, masses or hernias.  RECTAL EXAMINATION:  Reveals external hemorrhoids, minor anal canal  tenderness and Hemoccult negative brown stool in the vault.  NEUROLOGIC:  Alert and oriented x3.  Grossly nonfocal.   ASSESSMENT AND PLAN:  Rectal bleeding with associated rectal pain.  I  suspect he has recurrent hemorrhoidal symptoms.  External hemorrhoids noted  on exam.  Rule out colorectal neoplasms, proctitis and internal hemorrhoids.  Begin Analpram 2.5% cream to use b.i.d. as needed and begin rectal care  instructions.  Risks, benefits, and alternatives to colonoscopy with  possible biopsy, possible polypectomy, and possible destruction of internal  hemorrhoids discussed with the patient and he consents to proceed.  This  will be scheduled electively.     Venita Lick. Russella Dar, MD, [facility]  Electronically Signed    MTS/MedQ  DD: 05/17/2006  DT: 05/17/2006  Job #: [id number]  cc:   Corwin Levins, MD

## 2011-01-13 ENCOUNTER — Other Ambulatory Visit: Payer: Self-pay

## 2011-01-13 MED ORDER — FLUOXETINE HCL 40 MG PO CAPS: 40.0000 mg | ORAL_CAPSULE | Freq: Every day | ORAL | Status: DC

## 2011-03-31 ENCOUNTER — Telehealth: Payer: Self-pay

## 2011-03-31 DIAGNOSIS — Z1289 Encounter for screening for malignant neoplasm of other sites: Secondary | ICD-10-CM

## 2011-03-31 DIAGNOSIS — Z0000 Encounter for general adult medical examination without abnormal findings: Secondary | ICD-10-CM

## 2011-03-31 NOTE — Telephone Encounter (Signed)
Put order in for physical labs. 

## 2011-04-26 ENCOUNTER — Other Ambulatory Visit: Payer: Self-pay | Admitting: Internal Medicine

## 2011-05-16 ENCOUNTER — Encounter: Payer: Self-pay | Admitting: Internal Medicine

## 2011-05-16 DIAGNOSIS — Z0000 Encounter for general adult medical examination without abnormal findings: Secondary | ICD-10-CM | POA: Insufficient documentation

## 2011-05-18 ENCOUNTER — Other Ambulatory Visit: Payer: Self-pay | Admitting: Internal Medicine

## 2011-05-18 ENCOUNTER — Other Ambulatory Visit (INDEPENDENT_AMBULATORY_CARE_PROVIDER_SITE_OTHER): Payer: BC Managed Care – PPO

## 2011-05-18 ENCOUNTER — Encounter: Payer: Self-pay | Admitting: Internal Medicine

## 2011-05-18 ENCOUNTER — Ambulatory Visit (INDEPENDENT_AMBULATORY_CARE_PROVIDER_SITE_OTHER): Payer: BC Managed Care – PPO | Admitting: Internal Medicine

## 2011-05-18 VITALS — BP 160/82 | HR 96 | Temp 98.2°F | Ht 67.0 in | Wt 185.5 lb

## 2011-05-18 DIAGNOSIS — Z86010 Personal history of colonic polyps: Secondary | ICD-10-CM

## 2011-05-18 DIAGNOSIS — R7302 Impaired glucose tolerance (oral): Secondary | ICD-10-CM | POA: Insufficient documentation

## 2011-05-18 DIAGNOSIS — R7309 Other abnormal glucose: Secondary | ICD-10-CM

## 2011-05-18 DIAGNOSIS — J209 Acute bronchitis, unspecified: Secondary | ICD-10-CM | POA: Insufficient documentation

## 2011-05-18 DIAGNOSIS — Z0000 Encounter for general adult medical examination without abnormal findings: Secondary | ICD-10-CM

## 2011-05-18 DIAGNOSIS — Z23 Encounter for immunization: Secondary | ICD-10-CM

## 2011-05-18 LAB — HEPATIC FUNCTION PANEL
AST: 58 U/L — ABNORMAL HIGH (ref 0–37)
Albumin: 4.3 g/dL (ref 3.5–5.2)
Alkaline Phosphatase: 73 U/L (ref 39–117)
Bilirubin, Direct: 0.1 mg/dL (ref 0.0–0.3)
Total Protein: 8.2 g/dL (ref 6.0–8.3)

## 2011-05-18 LAB — CBC WITH DIFFERENTIAL/PLATELET
Basophils Absolute: 0 10*3/uL (ref 0.0–0.1)
Eosinophils Absolute: 0 10*3/uL (ref 0.0–0.7)
HCT: 43 % (ref 39.0–52.0)
Lymphocytes Relative: 16.6 % (ref 12.0–46.0)
Lymphs Abs: 1 10*3/uL (ref 0.7–4.0)
MCHC: 34.2 g/dL (ref 30.0–36.0)
Monocytes Relative: 10.5 % (ref 3.0–12.0)
Platelets: 153 10*3/uL (ref 150.0–400.0)
RDW: 13.2 % (ref 11.5–14.6)

## 2011-05-18 LAB — BASIC METABOLIC PANEL
CO2: 27 mEq/L (ref 19–32)
Chloride: 99 mEq/L (ref 96–112)
Glucose, Bld: 100 mg/dL — ABNORMAL HIGH (ref 70–99)
Potassium: 3.2 mEq/L — ABNORMAL LOW (ref 3.5–5.1)
Sodium: 137 mEq/L (ref 135–145)

## 2011-05-18 LAB — TSH: TSH: 1.29 u[IU]/mL (ref 0.35–5.50)

## 2011-05-18 LAB — URINALYSIS, ROUTINE W REFLEX MICROSCOPIC
Hgb urine dipstick: NEGATIVE
Ketones, ur: NEGATIVE
Leukocytes, UA: NEGATIVE
Specific Gravity, Urine: 1.025 (ref 1.000–1.030)
Urine Glucose: NEGATIVE
Urobilinogen, UA: 0.2 (ref 0.0–1.0)

## 2011-05-18 LAB — LDL CHOLESTEROL, DIRECT: Direct LDL: 154.4 mg/dL

## 2011-05-18 LAB — PSA: PSA: 2.12 ng/mL (ref 0.10–4.00)

## 2011-05-18 MED ORDER — SIMVASTATIN 40 MG PO TABS: 40.0000 mg | ORAL_TABLET | Freq: Every day | ORAL | Status: DC

## 2011-05-18 MED ORDER — AZITHROMYCIN 250 MG PO TABS: ORAL_TABLET | ORAL | Status: AC

## 2011-05-18 MED ORDER — LOSARTAN POTASSIUM-HCTZ 100-25 MG PO TABS: 1.0000 | ORAL_TABLET | Freq: Every day | ORAL | Status: DC

## 2011-05-18 MED ORDER — FLUOXETINE HCL 40 MG PO CAPS: 40.0000 mg | ORAL_CAPSULE | Freq: Every day | ORAL | Status: DC

## 2011-05-18 NOTE — Assessment & Plan Note (Signed)
asympt   - for a1c today  

## 2011-05-18 NOTE — Progress Notes (Signed)
Subjective:    Patient ID: Richard Calhoun, male    DOB: 06/10/1961, 50 y.o.   MRN: [id number]  HPI  Here for wellness and f/u;  Overall doing ok;  Pt denies CP, worsening SOB, DOE, wheezing, orthopnea, PND, worsening LE edema, palpitations, dizziness or syncope, though Here with acute onset mild to mod 2-3 days ST, HA, general weakness and malaise, with prod cough greenish sputum.  Pt denies neurological change such as new Headache, facial or extremity weakness.  Pt denies polydipsia, polyuria, or low sugar symptoms. Pt states overall good compliance with treatment and medications, good tolerability, and trying to follow lower cholesterol diet.  Pt denies worsening depressive symptoms, suicidal ideation or panic. No fever, wt loss, night sweats, loss of appetite, or other constitutional symptoms.  Pt states good ability with ADL's, low fall risk, home safety reviewed and adequate, no significant changes in hearing or vision, and occasionally active with exercise.  Still with significant etoh use/dependence. Here with acute onset mild to mod 2-3 days ST, HA, general weakness and malaise, with prod cough greenish sputum. Past Medical History  Diagnosis Date  . Impaired glucose tolerance 05/18/2011  . DEPENDENCE, ALCOHOL NEC/NOS, UNSPECIFIED 02/21/2007  . HYPERLIPIDEMIA 02/21/2007  . ANXIETY 10/30/2009  . DEPRESSION 02/21/2007  . HYPERTENSION 02/21/2007  . ALLERGIC RHINITIS 02/21/2007  . GERD 02/21/2007  . OSTEOARTHRITIS 02/21/2007  . BICEPS TENDON RUPTURE, RIGHT 06/01/2010  . COLONIC POLYPS, HX OF 02/21/2007   No past surgical history on file.  reports that he has been smoking.  He does not have any smokeless tobacco history on file. His alcohol and drug histories not on file. family history is not on file. Allergies  Allergen Reactions  . Penicillins    No current outpatient prescriptions on file prior to visit.    Review of Systems Review of Systems  Constitutional: Negative for diaphoresis, activity  change, appetite change and unexpected weight change.  HENT: Negative for hearing loss, ear pain, facial swelling, mouth sores and neck stiffness.   Eyes: Negative for pain, redness and visual disturbance.  Respiratory: Negative for shortness of breath and wheezing.   Cardiovascular: Negative for chest pain and palpitations.  Gastrointestinal: Negative for diarrhea, blood in stool, abdominal distention and rectal pain.  Genitourinary: Negative for hematuria, flank pain and decreased urine volume.  Musculoskeletal: Negative for myalgias and joint swelling.  Skin: Negative for color change and wound.  Neurological: Negative for syncope and numbness.  Hematological: Negative for adenopathy.  Psychiatric/Behavioral: Negative for hallucinations, self-injury, decreased concentration and agitation.     Objective:   Physical Exam BP 160/82  Pulse 96  Temp(Src) 98.2 F (36.8 C) (Oral)  Ht 5\' 7"  (1.702 m)  Wt 185 lb 8 oz (84.142 kg)  BMI 29.05 kg/m2  SpO2 93% Physical Exam  VS noted, mild ill appearing Constitutional: Pt is oriented to person, place, and time. Appears well-developed and well-nourished.  HENT:  Head: Normocephalic and atraumatic.  Right Ear: External ear normal.  Left Ear: External ear normal.  Nose: Nose normal.  Mouth/Throat: Oropharynx is clear and moist. Bilat tm's mild erythema.  Sinus nontender.  Pharynx mild erythema  Eyes: Conjunctivae and EOM are normal. Pupils are equal, round, and reactive to light.  Neck: Normal range of motion. Neck supple. No JVD present. No tracheal deviation present.  Cardiovascular: Normal rate, regular rhythm, normal heart sounds and intact distal pulses.   Pulmonary/Chest: Effort normal and breath sounds normal.  Abdominal: Soft. Bowel sounds are normal. There  is no tenderness.  Musculoskeletal: Normal range of motion. Exhibits no edema.  Lymphadenopathy:  Has no cervical adenopathy.  Neurological: Pt is alert and oriented to person,  place, and time. Pt has normal reflexes. No cranial nerve deficit.  Skin: Skin is warm and dry. No rash noted.  Psychiatric:  Has  normal mood and affect. Behavior is normal.     Assessment & Plan:

## 2011-05-18 NOTE — Assessment & Plan Note (Signed)
Mild to mod, for antibx course,  to f/u any worsening symptoms or concerns 

## 2011-05-18 NOTE — Patient Instructions (Signed)
You had the tetanus shot today You will be contacted regarding the referral for: colonoscopy Please go to LAB in the Basement for the blood and/or urine tests to be done today Please call the phone number [phone] (the PhoneTree System) for results of testing in 2-3 days;  When calling, simply dial the number, and when prompted enter the MRN number above (the Medical Record Number) and the # key, then the message should start. Take all new medications as prescribed Continue all other medications as before Please return in 1 year for your yearly visit, or sooner if needed, with Lab testing done 3-5 days before

## 2011-05-18 NOTE — Assessment & Plan Note (Signed)
Due for colonoscopy - will arrange

## 2011-05-18 NOTE — Assessment & Plan Note (Addendum)
Overall doing well, age appropriate education and counseling updated, referrals for preventative services and immunizations addressed, dietary and smoking counseling addressed, most recent labs and ECG reviewed.  I have personally reviewed and have noted: 1) the patient's medical and social history 2) The pt's use of alcohol, tobacco, and illicit drugs 3) The patient's current medications and supplements 4) Functional ability including ADL's, fall risk, home safety risk, hearing and visual impairment 5) Diet and physical activities 6) Evidence for depression or mood disorder 7) The patient's height, weight, and BMI have been recorded in the chart I have made referrals, and provided counseling and education based on review of the above Declines flu shot, all med refills done, ECG reviewed as per emr

## 2011-05-20 ENCOUNTER — Encounter: Payer: Self-pay | Admitting: Internal Medicine

## 2011-06-07 ENCOUNTER — Encounter: Payer: Self-pay | Admitting: Internal Medicine

## 2011-07-30 ENCOUNTER — Encounter: Payer: Self-pay | Admitting: Gastroenterology

## 2012-04-18 ENCOUNTER — Encounter: Payer: Self-pay | Admitting: Gastroenterology

## 2012-05-02 ENCOUNTER — Telehealth: Payer: Self-pay | Admitting: Internal Medicine

## 2012-05-02 NOTE — Telephone Encounter (Signed)
The patient called and has scheduled a cpe for January.  He is hoping to get his medicine refilled to last until this apt.

## 2012-05-02 NOTE — Telephone Encounter (Signed)
Called left message to call back 

## 2012-05-02 NOTE — Telephone Encounter (Signed)
Called the patient left message to call back 

## 2012-05-03 ENCOUNTER — Other Ambulatory Visit: Payer: Self-pay

## 2012-05-03 MED ORDER — LOSARTAN POTASSIUM-HCTZ 100-25 MG PO TABS: 1.0000 | ORAL_TABLET | Freq: Every day | ORAL | Status: DC

## 2012-05-03 MED ORDER — OMEPRAZOLE 20 MG PO CPDR: 20.0000 mg | DELAYED_RELEASE_CAPSULE | Freq: Every day | ORAL | Status: DC

## 2012-05-03 MED ORDER — FLUOXETINE HCL 40 MG PO CAPS: 40.0000 mg | ORAL_CAPSULE | Freq: Every day | ORAL | Status: DC

## 2012-05-03 MED ORDER — SIMVASTATIN 40 MG PO TABS: 40.0000 mg | ORAL_TABLET | Freq: Every day | ORAL | Status: DC

## 2012-05-04 NOTE — Telephone Encounter (Signed)
Patient has been notified and prescriptions needed sent in.

## 2012-08-01 ENCOUNTER — Encounter: Payer: BC Managed Care – PPO | Admitting: Internal Medicine

## 2012-08-03 ENCOUNTER — Encounter: Payer: BC Managed Care – PPO | Admitting: Internal Medicine

## 2012-08-29 ENCOUNTER — Ambulatory Visit (INDEPENDENT_AMBULATORY_CARE_PROVIDER_SITE_OTHER): Payer: BC Managed Care – PPO | Admitting: Internal Medicine

## 2012-08-29 ENCOUNTER — Encounter: Payer: Self-pay | Admitting: Internal Medicine

## 2012-08-29 ENCOUNTER — Other Ambulatory Visit (INDEPENDENT_AMBULATORY_CARE_PROVIDER_SITE_OTHER): Payer: BC Managed Care – PPO

## 2012-08-29 VITALS — BP 140/84 | HR 110 | Temp 97.9°F | Ht 67.0 in | Wt 193.5 lb

## 2012-08-29 DIAGNOSIS — I10 Essential (primary) hypertension: Secondary | ICD-10-CM

## 2012-08-29 DIAGNOSIS — R7302 Impaired glucose tolerance (oral): Secondary | ICD-10-CM

## 2012-08-29 DIAGNOSIS — Z0000 Encounter for general adult medical examination without abnormal findings: Secondary | ICD-10-CM

## 2012-08-29 DIAGNOSIS — K219 Gastro-esophageal reflux disease without esophagitis: Secondary | ICD-10-CM

## 2012-08-29 DIAGNOSIS — R7309 Other abnormal glucose: Secondary | ICD-10-CM

## 2012-08-29 DIAGNOSIS — R197 Diarrhea, unspecified: Secondary | ICD-10-CM

## 2012-08-29 LAB — BASIC METABOLIC PANEL
BUN: 22 mg/dL (ref 6–23)
Calcium: 9.9 mg/dL (ref 8.4–10.5)
Creatinine, Ser: 0.8 mg/dL (ref 0.4–1.5)
GFR: 111.98 mL/min (ref >60.00)
Glucose, Bld: 91 mg/dL (ref 70–99)

## 2012-08-29 LAB — CBC WITH DIFFERENTIAL/PLATELET
Basophils Relative: 0.4 % (ref 0.0–3.0)
Eosinophils Relative: 1.5 % (ref 0.0–5.0)
HCT: 41.5 % (ref 39.0–52.0)
Hemoglobin: 14.1 g/dL (ref 13.0–17.0)
Lymphs Abs: 1 10*3/uL (ref 0.7–4.0)
Monocytes Relative: 9.8 % (ref 3.0–12.0)
Platelets: 160 10*3/uL (ref 150.0–400.0)
RBC: 4.19 Mil/uL — ABNORMAL LOW (ref 4.22–5.81)
WBC: 4.9 10*3/uL (ref 4.5–10.5)

## 2012-08-29 LAB — HEPATIC FUNCTION PANEL
AST: 35 U/L (ref 0–37)
Albumin: 4.1 g/dL (ref 3.5–5.2)
Total Bilirubin: 0.7 mg/dL (ref 0.3–1.2)

## 2012-08-29 LAB — URINALYSIS, ROUTINE W REFLEX MICROSCOPIC
Ketones, ur: NEGATIVE
Leukocytes, UA: NEGATIVE
Nitrite: NEGATIVE
Specific Gravity, Urine: 1.02 (ref 1.000–1.030)
Total Protein, Urine: NEGATIVE
pH: 6 (ref 5.0–8.0)

## 2012-08-29 LAB — HEMOGLOBIN A1C: Hgb A1c MFr Bld: 6 % (ref 4.6–6.5)

## 2012-08-29 LAB — LDL CHOLESTEROL, DIRECT: Direct LDL: 113.4 mg/dL

## 2012-08-29 LAB — TSH: TSH: 0.59 u[IU]/mL (ref 0.35–5.50)

## 2012-08-29 LAB — LIPID PANEL
Cholesterol: 207 mg/dL — ABNORMAL HIGH (ref 0–200)
HDL: 67.7 mg/dL (ref >39.00)
Triglycerides: 162 mg/dL — ABNORMAL HIGH (ref 0.0–149.0)
VLDL: 32.4 mg/dL (ref 0.0–40.0)

## 2012-08-29 LAB — PSA: PSA: 1.74 ng/mL (ref 0.10–4.00)

## 2012-08-29 MED ORDER — ASPIRIN 81 MG PO TBEC: 81.0000 mg | DELAYED_RELEASE_TABLET | Freq: Every day | ORAL | Status: DC

## 2012-08-29 MED ORDER — SIMVASTATIN 40 MG PO TABS: 40.0000 mg | ORAL_TABLET | Freq: Every day | ORAL | Status: DC

## 2012-08-29 MED ORDER — PANTOPRAZOLE SODIUM 40 MG PO TBEC: 40.0000 mg | DELAYED_RELEASE_TABLET | Freq: Every day | ORAL | Status: DC

## 2012-08-29 MED ORDER — FLUOXETINE HCL 40 MG PO CAPS: 40.0000 mg | ORAL_CAPSULE | Freq: Every day | ORAL | Status: DC

## 2012-08-29 MED ORDER — LOSARTAN POTASSIUM-HCTZ 100-25 MG PO TABS: 1.0000 | ORAL_TABLET | Freq: Every day | ORAL | Status: DC

## 2012-08-29 NOTE — Progress Notes (Signed)
Subjective:     Patient ID: Richard Calhoun, male   DOB: 1960/08/07, 52 y.o.   MRN: [id number]  HPI   Here for wellness and f/u;  Overall doing ok;  Pt denies CP, worsening SOB, DOE, wheezing, orthopnea, PND, worsening LE edema, palpitations, dizziness or syncope.  Pt denies neurological change such as new headache, facial or extremity weakness.  Pt denies polydipsia, polyuria, or low sugar symptoms. Pt states overall good compliance with treatment and medications, good tolerability, and has been trying to follow lower cholesterol diet.  Pt denies worsening depressive symptoms, suicidal ideation or panic. No fever, night sweats, wt loss, loss of appetite, or other constitutional symptoms.  Pt states good ability with ADL's, has low fall risk, home safety reviewed and adequate, no other significant changes in hearing or vision, and only occasionally active with exercise. Lost job in December, finances some tight, more stress recent, still drinking daily.  Has had 2 mo ongoing diarrhea with worsening reflux symptoms as well.  Denies worsening abd pain, dysphagia, n/v, or blood. Past Medical History  Diagnosis Date  . Impaired glucose tolerance 05/18/2011  . DEPENDENCE, ALCOHOL NEC/NOS, UNSPECIFIED 02/21/2007  . HYPERLIPIDEMIA 02/21/2007  . ANXIETY 10/30/2009  . DEPRESSION 02/21/2007  . HYPERTENSION 02/21/2007  . ALLERGIC RHINITIS 02/21/2007  . GERD 02/21/2007  . OSTEOARTHRITIS 02/21/2007  . BICEPS TENDON RUPTURE, RIGHT 06/01/2010  . COLONIC POLYPS, HX OF 02/21/2007   No past surgical history on file.  reports that he has been smoking.  He does not have any smokeless tobacco history on file. His alcohol and drug histories are not on file. family history is not on file. Allergies  Allergen Reactions  . Penicillins    No current outpatient prescriptions on file prior to visit.   No current facility-administered medications on file prior to visit.   Review of Systems Constitutional: Negative for diaphoresis,  activity change, appetite change or unexpected weight change.  HENT: Negative for hearing loss, ear pain, facial swelling, mouth sores and neck stiffness.   Eyes: Negative for pain, redness and visual disturbance.  Respiratory: Negative for shortness of breath and wheezing.   Cardiovascular: Negative for chest pain and palpitations.  Gastrointestinal: Negative for diarrhea, blood in stool, abdominal distention or other pain Genitourinary: Negative for hematuria, flank pain or change in urine volume.  Musculoskeletal: Negative for myalgias and joint swelling.  Skin: Negative for color change and wound.  Neurological: Negative for syncope and numbness. other than noted Hematological: Negative for adenopathy.  Psychiatric/Behavioral: Negative for hallucinations, self-injury, decreased concentration and agitation.      Objective:   Physical Exam BP 140/84  Pulse 110  Temp(Src) 97.9 F (36.6 C) (Oral)  Ht 5\' 7"  (1.702 m)  Wt 193 lb 8 oz (87.771 kg)  BMI 30.3 kg/m2  SpO2 93% VS noted, not ill appearing  Constitutional: Pt is oriented to person, place, and time. Appears obese, smells of alcohol, somewhat disheveled.  Head: Normocephalic and atraumatic.  Right Ear: External ear normal.  Left Ear: External ear normal.  Nose: Nose normal.  Mouth/Throat: Oropharynx is clear and moist.  Eyes: Conjunctivae and EOM are normal. Pupils are equal, round, and reactive to light.  Neck: Normal range of motion. Neck supple. No JVD present. No tracheal deviation present.  Cardiovascular: Normal rate, regular rhythm, normal heart sounds and intact distal pulses.   Pulmonary/Chest: Effort normal and breath sounds normal.  Abdominal: Soft. Bowel sounds are normal. There is no tenderness. No HSM  Musculoskeletal: Normal  range of motion. Exhibits no edema.  Lymphadenopathy:  Has no cervical adenopathy.  Neurological: Pt is alert and oriented to person, place, and time. Pt has normal reflexes. No cranial  nerve deficit.  Skin: Skin is warm and dry. No rash noted.  Psychiatric:  Has  normal mood and affect. Behavior is normal. except 1-2+ nervous    Assessment:         Plan:

## 2012-08-29 NOTE — Assessment & Plan Note (Addendum)
ECG reviewed as per emr, BP controlled at home per pt, encouraged to stop ETOH,  to f/u any worsening symptoms or concerns BP Readings from Last 3 Encounters:  08/29/12 140/84  05/18/11 160/82  06/01/10 164/92

## 2012-08-29 NOTE — Patient Instructions (Addendum)
Your EKG was OK today Please take all new medication as prescribed - the generic for protonix for the reflux Please also take Aspirin at 81 mg per day (to reduce risk of stroke and heart disease) Please continue all other medications as before, and refills have been done if requested (prozac) You will be contacted regarding the referral for: Gastroenterology (for reflux, diarrhea and screening colonoscopy Please stop smoking Please stop drinking alcohol Please continue your efforts at being more active, low cholesterol diet, and weight control. You are otherwise up to date with prevention measures today. Please go to the LAB in the Basement (turn left off the elevator) for the tests to be done today You will be contacted by phone if any changes need to be made immediately.  Otherwise, you will receive a letter about your results with an explanation. Please remember to sign up for My Chart if you have not done so, as this will be important to you in the future with finding out test results, communicating by private email, and scheduling acute appointments online when needed. Please return in 6 months, or sooner if needed

## 2012-09-01 NOTE — Assessment & Plan Note (Signed)
For protonix asd,  to f/u any worsening symptoms or concerns  

## 2012-09-01 NOTE — Assessment & Plan Note (Signed)
Unclear etiology, for GI referral per pt reqeust

## 2012-09-01 NOTE — Assessment & Plan Note (Signed)
stable overall by history and exam, recent data reviewed with pt, and pt to continue medical treatment as before,  to f/u any worsening symptoms or concerns Lab Results  Component Value Date   HGBA1C 6.0 08/29/2012

## 2012-09-01 NOTE — Assessment & Plan Note (Signed)

## 2012-09-12 ENCOUNTER — Encounter: Payer: Self-pay | Admitting: Gastroenterology

## 2012-09-27 ENCOUNTER — Encounter: Payer: Self-pay | Admitting: Gastroenterology

## 2012-09-27 ENCOUNTER — Ambulatory Visit (INDEPENDENT_AMBULATORY_CARE_PROVIDER_SITE_OTHER): Payer: BC Managed Care – PPO | Admitting: Gastroenterology

## 2012-09-27 VITALS — BP 100/70 | HR 76 | Ht 69.0 in | Wt 196.4 lb

## 2012-09-27 DIAGNOSIS — Z86010 Personal history of colonic polyps: Secondary | ICD-10-CM

## 2012-09-27 MED ORDER — PEG-KCL-NACL-NASULF-NA ASC-C 100 G PO SOLR: 1.0000 | Freq: Once | ORAL | Status: DC

## 2012-09-27 NOTE — Patient Instructions (Addendum)
You have been given a separate informational sheet regarding your tobacco use, the importance of quitting and local resources to help you quit.  You have been scheduled for a colonoscopy with propofol. Please follow written instructions given to you at your visit today.  Please pick up your prep kit at the pharmacy within the next 1-3 days. If you use inhalers (even only as needed), please bring them with you on the day of your procedure.  Thank you for choosing me and [facility].  Venita Lick. Pleas Koch., MD., [facility]

## 2012-09-27 NOTE — Progress Notes (Signed)
History of Present Illness: This is a 52 year old male that I have evaluated in the past. He has a history of adenomatous colon polyps diagnosed in December 2007 and a history of hemorrhoids which was treated with injection sclerosis. He was recommended to undergo a five-year surveillance colonoscopy in December 2012 but he did not return. He relates a change in bowel habits that began about 2 months ago with urgent, loose postprandial bowel movements. His symptoms have improved substantially to the point where he is only having an occasional loose bowel movement after meals about 2 or 3 times each week. Does not relate any recent antibiotic usage, diet or medication changes that coincide with the bowel habit change. He notes occasional scant amounts of rectal bleeding over the past few years this has not changed recently. His reflux symptoms were not well controlled on Prilosec and pantoprazole was started about 2 weeks ago which has since improved control of his reflux symptoms. He previously underwent upper endoscopy by Dr. Carman Ching in 2000 which revealed a hiatal hernia and no other abnormalities. Denies weight loss, abdominal pain, constipation, change in stool caliber, melena, nausea, vomiting, dysphagia, chest pain.  Review of Systems: Pertinent positive and negative review of systems were noted in the above HPI section. All other review of systems were otherwise negative.  Current Medications, Allergies, Past Medical History, Past Surgical History, Family History and Social History were reviewed in [employer] record.  Physical Exam: General: Well developed , well nourished, no acute distress Head: Normocephalic and atraumatic Eyes:  sclerae anicteric, EOMI Ears: Normal auditory acuity Mouth: No deformity or lesions Neck: Supple, no masses or thyromegaly Lungs: Clear throughout to auscultation Heart: Regular rate and rhythm; no murmurs, rubs or bruits Abdomen: Soft,  non tender and non distended. No masses, hepatosplenomegaly or hernias noted. Normal Bowel sounds Rectal: Deferred to colonoscopy Musculoskeletal: Symmetrical with no gross deformities  Skin: No lesions on visible extremities Pulses:  Normal pulses noted Extremities: No clubbing, cyanosis, edema or deformities noted Neurological: Alert oriented x 4, grossly nonfocal Cervical Nodes:  No significant cervical adenopathy Inguinal Nodes: No significant inguinal adenopathy Psychological:  Alert and cooperative. Normal mood and affect  Assessment and Recommendations:  1. Change in bowel habits with looser stools. Symptoms are resolving-etiology unclear. Rule out colorectal neoplasms. Schedule colonoscopy. The risks, benefits, and alternatives to colonoscopy with possible biopsy and possible polypectomy were discussed with the patient and they consent to proceed.   2. Intermittent scant hematochezia likely secondary to hemorrhoids. Symptoms substantially improved following injection sclerosis treatment 2007.   3. Personal history of adenomatous colon polyps. Colonoscopy as above.   4. GERD. Continue standard antireflux measures and pantoprazole 40 mg daily

## 2012-09-29 ENCOUNTER — Encounter: Payer: Self-pay | Admitting: Gastroenterology

## 2012-10-23 ENCOUNTER — Ambulatory Visit (INDEPENDENT_AMBULATORY_CARE_PROVIDER_SITE_OTHER): Payer: BC Managed Care – PPO | Admitting: Internal Medicine

## 2012-10-23 ENCOUNTER — Encounter: Payer: Self-pay | Admitting: Internal Medicine

## 2012-10-23 VITALS — BP 154/90 | HR 96 | Temp 98.1°F | Ht 67.0 in | Wt 193.5 lb

## 2012-10-23 DIAGNOSIS — L089 Local infection of the skin and subcutaneous tissue, unspecified: Secondary | ICD-10-CM

## 2012-10-23 DIAGNOSIS — F1020 Alcohol dependence, uncomplicated: Secondary | ICD-10-CM

## 2012-10-23 DIAGNOSIS — R7302 Impaired glucose tolerance (oral): Secondary | ICD-10-CM

## 2012-10-23 DIAGNOSIS — I10 Essential (primary) hypertension: Secondary | ICD-10-CM

## 2012-10-23 DIAGNOSIS — R7309 Other abnormal glucose: Secondary | ICD-10-CM

## 2012-10-23 DIAGNOSIS — L723 Sebaceous cyst: Secondary | ICD-10-CM

## 2012-10-23 MED ORDER — DOXYCYCLINE HYCLATE 100 MG PO TABS: 100.0000 mg | ORAL_TABLET | Freq: Two times a day (BID) | ORAL | Status: DC

## 2012-10-23 NOTE — Assessment & Plan Note (Signed)
stable overall by history and exam, recent data reviewed with pt, and pt to continue medical treatment as before,  to f/u any worsening symptoms or concerns Lab Results  Component Value Date   HGBA1C 6.0 08/29/2012

## 2012-10-23 NOTE — Assessment & Plan Note (Signed)
elev BP today likely situational though could be etoh related, declines change today in antiBP med

## 2012-10-23 NOTE — Progress Notes (Signed)
Subjective:    Patient ID: Richard Calhoun, male    DOB: [birth year], 52 y.o.   MRN: [id number]  HPI  Here with acute onset 3-4 days swollen but minimally tender area to the right occiput area, without fever, worsening redness or drainage.  No prior hx of same. No trauma, falls, still drinks daily, tends to minimize the amount.   Pt denies fever, wt loss, night sweats, loss of appetite, or other constitutional symptoms  Pt denies chest pain, increased sob or doe, wheezing, orthopnea, PND, increased LE swelling, palpitations, dizziness or syncope. Past Medical History  Diagnosis Date  . Impaired glucose tolerance   . DEPENDENCE, ALCOHOL NEC/NOS, UNSPECIFIED   . HYPERLIPIDEMIA   . ANXIETY   . DEPRESSION   . HYPERTENSION   . ALLERGIC RHINITIS   . GERD   . OSTEOARTHRITIS   . BICEPS TENDON RUPTURE, RIGHT   . Tubular adenoma of colon 06/2006   Past Surgical History  Procedure Laterality Date  . Foot surgery    . Mandible fracture surgery      reports that he has been smoking.  He has never used smokeless tobacco. He reports that  drinks alcohol. He reports that he does not use illicit drugs. family history includes Kidney disease in his sister and Stroke in his mother. Allergies  Allergen Reactions  . Penicillins    Current Outpatient Prescriptions on File Prior to Visit  Medication Sig Dispense Refill  . aspirin 81 MG EC tablet Take 1 tablet (81 mg total) by mouth daily. Swallow whole.  30 tablet  12  . FLUoxetine (PROZAC) 40 MG capsule Take 1 capsule (40 mg total) by mouth daily.  90 capsule  3  . losartan-hydrochlorothiazide (HYZAAR) 100-25 MG per tablet Take 1 tablet by mouth daily.  90 tablet  3  . pantoprazole (PROTONIX) 40 MG tablet Take 1 tablet (40 mg total) by mouth daily.  90 tablet  3  . peg 3350 powder (MOVIPREP) 100 G SOLR Take 1 kit (100 g total) by mouth once.  1 kit  0  . simvastatin (ZOCOR) 40 MG tablet Take 1 tablet (40 mg total) by mouth at bedtime.  90 tablet  3    No current facility-administered medications on file prior to visit.   Review of Systems  Constitutional: Negative for unexpected weight change, or unusual diaphoresis  HENT: Negative for tinnitus.   Eyes: Negative for photophobia and visual disturbance.  Respiratory: Negative for choking and stridor.   Gastrointestinal: Negative for vomiting and blood in stool.  Genitourinary: Negative for hematuria and decreased urine volume.  Musculoskeletal: Negative for acute joint swelling Skin: Negative for color change and wound.  Neurological: Negative for tremors and numbness other than noted  Psychiatric/Behavioral: Negative for decreased concentration or  hyperactivity.       Objective:   Physical Exam BP 154/90  Pulse 96  Temp(Src) 98.1 F (36.7 C) (Oral)  Ht 5\' 7"  (1.702 m)  Wt 193 lb 8 oz (87.771 kg)  BMI 30.3 kg/m2  SpO2 94% VS noted,  Constitutional: Pt appears well-developed and well-nourished.  HENT: Head: NCAT.  Right Ear: External ear normal.  Left Ear: External ear normal.  Eyes: Conjunctivae and EOM are normal. Pupils are equal, round, and reactive to light.  Neck: Normal range of motion. Neck supple.  Cardiovascular: Normal rate and regular rhythm.   Pulmonary/Chest: Effort normal and breath sounds normal.  Abd:  Soft, NT, non-distended, + BS Neurological: Pt is alert. Not confused  Skin: Skin is warm. No erythema. right occiput approx 1-2 cm post right ear with 1 cm area raised swelling, mild tender/red  - ? seb cyst Psychiatric: Pt behavior is normal. Thought content normal.   Assessment & Plan:

## 2012-10-23 NOTE — Patient Instructions (Signed)
Please take all new medication as prescribed - the antibiotic Please continue all other medications as before, and refills have been done if requested. If the area becomes worse in the next few days such as more red/swelling/tender, you will need general surgury consultation

## 2012-10-23 NOTE — Assessment & Plan Note (Signed)
Not clear this is actually infected as has minimal tender/erythema but will try antibx, and if no change can watch, but if worse pain/red/swelling in the next 1-2 days will need gen surgury referral

## 2012-10-23 NOTE — Assessment & Plan Note (Signed)
Encouraged to abstain

## 2012-10-31 ENCOUNTER — Encounter: Payer: BC Managed Care – PPO | Admitting: Gastroenterology

## 2013-07-24 ENCOUNTER — Telehealth: Payer: Self-pay

## 2013-08-11 ENCOUNTER — Ambulatory Visit: Payer: BC Managed Care – PPO

## 2013-08-16 ENCOUNTER — Ambulatory Visit: Payer: No Typology Code available for payment source | Attending: Internal Medicine | Admitting: Internal Medicine

## 2013-08-16 VITALS — BP 185/104 | HR 109 | Temp 98.6°F | Resp 20 | Ht 67.0 in | Wt 212.0 lb

## 2013-08-16 DIAGNOSIS — I10 Essential (primary) hypertension: Secondary | ICD-10-CM | POA: Insufficient documentation

## 2013-08-16 MED ORDER — PANTOPRAZOLE SODIUM 40 MG PO TBEC: 40.0000 mg | DELAYED_RELEASE_TABLET | Freq: Every day | ORAL | Status: DC

## 2013-08-16 MED ORDER — LOSARTAN POTASSIUM-HCTZ 100-25 MG PO TABS: 1.0000 | ORAL_TABLET | Freq: Every day | ORAL | Status: DC

## 2013-08-16 MED ORDER — CLONIDINE HCL 0.1 MG PO TABS
0.1000 mg | ORAL_TABLET | Freq: Once | ORAL | Status: AC
  Administered 2013-08-16: 0.1 mg via ORAL

## 2013-08-16 MED ORDER — DOXYCYCLINE HYCLATE 100 MG PO TABS: 100.0000 mg | ORAL_TABLET | Freq: Two times a day (BID) | ORAL | Status: DC

## 2013-08-16 MED ORDER — SIMVASTATIN 40 MG PO TABS: 40.0000 mg | ORAL_TABLET | Freq: Every day | ORAL | Status: DC

## 2013-08-16 NOTE — Progress Notes (Signed)
Pt here to establish care for uncontrolled HTn Not taking meds x 3 mnths due to job loss BP- 185/104 gave Clonidine 0.1 mg C/o intermit light head

## 2013-08-16 NOTE — Patient Instructions (Signed)
Otitis Media, Adult Otitis media is redness, soreness, and swelling (inflammation) of the middle ear. Otitis media may be caused by allergies or, most commonly, by infection. Often it occurs as a complication of the common cold. SIGNS AND SYMPTOMS Symptoms of otitis media may include:  Earache.  Fever.  Ringing in your ear.  Headache.  Leakage of fluid from the ear. DIAGNOSIS To diagnose otitis media, your health care provider will examine your ear with an otoscope. This is an instrument that allows your health care provider to see into your ear in order to examine your eardrum. Your health care provider also will ask you questions about your symptoms. TREATMENT  Typically, otitis media resolves on its own within 3 5 days. Your health care provider may prescribe medicine to ease your symptoms of pain. If otitis media does not resolve within 5 days or is recurrent, your health care provider may prescribe antibiotic medicines if he or she suspects that a bacterial infection is the cause. HOME CARE INSTRUCTIONS   Take your medicine as directed until it is gone, even if you feel better after the first few days.  Only take over-the-counter or prescription medicines for pain, discomfort, or fever as directed by your health care provider.  Follow up with your health care provider as directed. SEEK MEDICAL CARE IF:  You have otitis media only in one ear or bleeding from your nose or both.  You notice a lump on your neck.  You are not getting better in 3 5 days.  You feel worse instead of better. SEEK IMMEDIATE MEDICAL CARE IF:   You have pain that is not controlled with medicine.  You have swelling, redness, or pain around your ear or stiffness in your neck.  You notice that part of your face is paralyzed.  You notice that the bone behind your ear (mastoid) is tender when you touch it. MAKE SURE YOU:   Understand these instructions.  Will watch your condition.  Will get help  right away if you are not doing well or get worse. Document Released: 04/09/2004 Document Revised: 04/25/2013 Document Reviewed: 01/30/2013 [employer] Patient Information 2014 [employer].  

## 2013-08-17 ENCOUNTER — Ambulatory Visit: Payer: No Typology Code available for payment source | Attending: Internal Medicine

## 2013-08-17 LAB — CBC WITH DIFFERENTIAL/PLATELET
BASOS ABS: 0 10*3/uL (ref 0.0–0.1)
BASOS PCT: 0 % (ref 0–1)
EOS PCT: 2 % (ref 0–5)
Eosinophils Absolute: 0.1 10*3/uL (ref 0.0–0.7)
HCT: 40.5 % (ref 39.0–52.0)
Hemoglobin: 14.2 g/dL (ref 13.0–17.0)
LYMPHS PCT: 16 % (ref 12–46)
Lymphs Abs: 1.1 10*3/uL (ref 0.7–4.0)
MCH: 32.6 pg (ref 26.0–34.0)
MCHC: 35.1 g/dL (ref 30.0–36.0)
MCV: 92.9 fL (ref 78.0–100.0)
Monocytes Absolute: 0.6 10*3/uL (ref 0.1–1.0)
Monocytes Relative: 8 % (ref 3–12)
NEUTROS ABS: 5.1 10*3/uL (ref 1.7–7.7)
Neutrophils Relative %: 74 % (ref 43–77)
PLATELETS: 156 10*3/uL (ref 150–400)
RBC: 4.36 MIL/uL (ref 4.22–5.81)
RDW: 14.2 % (ref 11.5–15.5)
WBC: 6.9 10*3/uL (ref 4.0–10.5)

## 2013-08-17 LAB — BASIC METABOLIC PANEL
BUN: 17 mg/dL (ref 6–23)
CO2: 25 mEq/L (ref 19–32)
CREATININE: 0.82 mg/dL (ref 0.50–1.35)
Calcium: 9.2 mg/dL (ref 8.4–10.5)
Chloride: 103 mEq/L (ref 96–112)
Glucose, Bld: 91 mg/dL (ref 70–99)
POTASSIUM: 3.9 meq/L (ref 3.5–5.3)
Sodium: 138 mEq/L (ref 135–145)

## 2013-08-17 LAB — LDL CHOLESTEROL, DIRECT: Direct LDL: 165 mg/dL — ABNORMAL HIGH

## 2013-08-20 NOTE — Progress Notes (Signed)
Patient ID: Richard Calhoun, male   DOB: August 25, 1960, 53 y.o.   MRN: [id number]  CC: Followup  HPI: Patient is 53 year old male who comes to clinic for regular followup. Needs refill on Prozac. He denies any specific concerns today, no fevers and chills, no abdominal or urinary concerns. Patient also reports running out of his blood pressure medicines and cholesterol medicines and has not taken any medicine in the long time. He knows his blood pressure is high at been checking it regularly and says the numbers are usually higher than 170/100. He denies headaches, no visual changes, no chest pain or shortness of breath. Allergies  Allergen Reactions  . Penicillins    Past Medical History  Diagnosis Date  . Impaired glucose tolerance   . DEPENDENCE, ALCOHOL NEC/NOS, UNSPECIFIED   . HYPERLIPIDEMIA   . ANXIETY   . DEPRESSION   . HYPERTENSION   . ALLERGIC RHINITIS   . GERD   . OSTEOARTHRITIS   . BICEPS TENDON RUPTURE, RIGHT   . Tubular adenoma of colon 06/2006   Current Outpatient Prescriptions on File Prior to Visit  Medication Sig Dispense Refill  . aspirin 81 MG EC tablet Take 1 tablet (81 mg total) by mouth daily. Swallow whole.  30 tablet  12  . doxycycline (VIBRA-TABS) 100 MG tablet Take 1 tablet (100 mg total) by mouth 2 (two) times daily.  20 tablet  0  . FLUoxetine (PROZAC) 40 MG capsule Take 1 capsule (40 mg total) by mouth daily.  90 capsule  3  . peg 3350 powder (MOVIPREP) 100 G SOLR Take 1 kit (100 g total) by mouth once.  1 kit  0   No current facility-administered medications on file prior to visit.   Family History  Problem Relation Age of Onset  . Kidney disease Sister   . Stroke Mother    History   Social History  . Marital Status: Single    Spouse Name: N/A    Number of Children: N/A  . Years of Education: N/A   Occupational History  . unemployed    Social History Main Topics  . Smoking status: Current Every Day Smoker  . Smokeless tobacco: Never Used  .  Alcohol Use: Yes  . Drug Use: No  . Sexual Activity: Not on file   Other Topics Concern  . Not on file   Social History Narrative  . No narrative on file    Review of Systems  Constitutional: Negative for fever, chills, diaphoresis, activity change, appetite change and fatigue.  HENT: Negative for ear pain, nosebleeds, congestion, facial swelling, rhinorrhea, neck pain, neck stiffness and ear discharge.   Eyes: Negative for pain, discharge, redness, itching and visual disturbance.  Respiratory: Negative for cough, choking, chest tightness, shortness of breath, wheezing and stridor.   Cardiovascular: Negative for chest pain, palpitations and leg swelling.  Gastrointestinal: Negative for abdominal distention.  Genitourinary: Negative for dysuria, urgency, frequency, hematuria, flank pain, decreased urine volume, difficulty urinating and dyspareunia.  Musculoskeletal: Negative for back pain, joint swelling, arthralgias and gait problem.  Neurological: Negative for dizziness, tremors, seizures, syncope, facial asymmetry, speech difficulty, weakness, light-headedness, numbness and headaches.  Hematological: Negative for adenopathy. Does not bruise/bleed easily.  Psychiatric/Behavioral: Negative for hallucinations, behavioral problems, confusion, dysphoric mood, decreased concentration and agitation.    Objective:   Filed Vitals:   08/16/13 1714  BP: 185/104  Pulse: 109  Temp: 98.6 F (37 C)  Resp: 20    Physical Exam  Constitutional: Appears  well-developed and well-nourished. No distress.  CVS: RRR, S1/S2 +, no murmurs, no gallops, no carotid bruit.  Pulmonary: Effort and breath sounds normal, no stridor, rhonchi, wheezes, rales.  Abdominal: Soft. BS +,  no distension, tenderness, rebound or guarding.  Musculoskeletal: Normal range of motion. No edema and no tenderness.  Lymphadenopathy: No lymphadenopathy noted, cervical, inguinal. Neuro: Alert. Normal reflexes, muscle tone  coordination. No cranial nerve deficit. Skin: Skin is warm and dry. No rash noted. Not diaphoretic. No erythema. No pallor.  Psychiatric: Normal mood and affect. Behavior, judgment, thought content normal.   Lab Results  Component Value Date   WBC 6.9 08/16/2013   HGB 14.2 08/16/2013   HCT 40.5 08/16/2013   MCV 92.9 08/16/2013   PLT 156 08/16/2013   Lab Results  Component Value Date   CREATININE 0.82 08/16/2013   BUN 17 08/16/2013   NA 138 08/16/2013   K 3.9 08/16/2013   CL 103 08/16/2013   CO2 25 08/16/2013    Lab Results  Component Value Date   HGBA1C 6.0 08/29/2012   Lipid Panel     Component Value Date/Time   CHOL 207* 08/29/2012 1520   TRIG 162.0* 08/29/2012 1520   HDL 67.70 08/29/2012 1520   CHOLHDL 3 08/29/2012 1520   VLDL 32.4 08/29/2012 1520   LDLCALC 93 12/23/2008 1536       Assessment and plan:   Patient Active Problem List   Diagnosis Date Noted  . HYPERTENSION - uncontrolled and secondary to medical noncompliance. We discussed this in detail. I will provide refills on medications. I have advised patient to check his blood pressure regularly and to call us back if the numbers are higher than 140/90 so we can readjust medical regimen. I advised patient to come on Monday, 08/20/2013 for blood pressure check.  02/21/2007  .  hyperlipidemia  - continue statins, we'll provide prescription, check lipid panel  02/21/2007

## 2013-09-12 ENCOUNTER — Ambulatory Visit: Payer: No Typology Code available for payment source | Attending: Internal Medicine

## 2014-08-28 ENCOUNTER — Ambulatory Visit: Payer: Self-pay | Attending: Internal Medicine | Admitting: Internal Medicine

## 2014-08-28 ENCOUNTER — Encounter: Payer: Self-pay | Admitting: Internal Medicine

## 2014-08-28 VITALS — BP 144/94 | HR 105 | Temp 98.4°F | Resp 14 | Ht 67.0 in | Wt 201.0 lb

## 2014-08-28 DIAGNOSIS — Z79899 Other long term (current) drug therapy: Secondary | ICD-10-CM | POA: Insufficient documentation

## 2014-08-28 DIAGNOSIS — E785 Hyperlipidemia, unspecified: Secondary | ICD-10-CM

## 2014-08-28 DIAGNOSIS — L304 Erythema intertrigo: Secondary | ICD-10-CM

## 2014-08-28 DIAGNOSIS — J449 Chronic obstructive pulmonary disease, unspecified: Secondary | ICD-10-CM

## 2014-08-28 DIAGNOSIS — Z23 Encounter for immunization: Secondary | ICD-10-CM

## 2014-08-28 DIAGNOSIS — F329 Major depressive disorder, single episode, unspecified: Secondary | ICD-10-CM | POA: Insufficient documentation

## 2014-08-28 DIAGNOSIS — K219 Gastro-esophageal reflux disease without esophagitis: Secondary | ICD-10-CM | POA: Insufficient documentation

## 2014-08-28 DIAGNOSIS — Z720 Tobacco use: Secondary | ICD-10-CM

## 2014-08-28 DIAGNOSIS — F17200 Nicotine dependence, unspecified, uncomplicated: Secondary | ICD-10-CM

## 2014-08-28 DIAGNOSIS — I10 Essential (primary) hypertension: Secondary | ICD-10-CM

## 2014-08-28 MED ORDER — FLUTICASONE-SALMETEROL 100-50 MCG/DOSE IN AEPB: 1.0000 | INHALATION_SPRAY | Freq: Two times a day (BID) | RESPIRATORY_TRACT | Status: DC

## 2014-08-28 MED ORDER — LOSARTAN POTASSIUM-HCTZ 100-25 MG PO TABS: 1.0000 | ORAL_TABLET | Freq: Every day | ORAL | Status: DC

## 2014-08-28 MED ORDER — SIMVASTATIN 40 MG PO TABS: 40.0000 mg | ORAL_TABLET | Freq: Every day | ORAL | Status: DC

## 2014-08-28 MED ORDER — KETOCONAZOLE 2 % EX CREA: 1.0000 "application " | TOPICAL_CREAM | Freq: Two times a day (BID) | CUTANEOUS | Status: DC

## 2014-08-28 NOTE — Progress Notes (Signed)
Patient ID: Richard Calhoun, male   DOB: 02-01-1961, 54 y.o.   MRN: [id number]  CC: rash  HPI: Richard Calhoun is a 54 y.o. male here today for a follow up visit.  Patient has past medical history of HTN, HLD, and depression.  He presents today with complaints of a rash in his bilateral axilla and bilateral groin.  He has had the rash for 2 weeks. He has tried lotions, neosporin, and jock itch cream but has been unable to get rid of the rash. It has been extremely pruritic and red. He reports that he has scratched so much that his skin is raw in his groin.  He continues to take his BP and cholesterol medication daily without complications. He currently smokes 2 ppd.  He notes that he had some swelling of his face to a blood pressure medication and believes that was similar to the medication he is on now. He denies any concerns with the current BP medication.  Patient has No headache, No chest pain, No abdominal pain - No Nausea, No new weakness tingling or numbness, No Cough - SOB.  Allergies  Allergen Reactions  . Penicillins    Past Medical History  Diagnosis Date  . Impaired glucose tolerance   . DEPENDENCE, ALCOHOL NEC/NOS, UNSPECIFIED   . HYPERLIPIDEMIA   . ANXIETY   . DEPRESSION   . HYPERTENSION   . ALLERGIC RHINITIS   . GERD   . OSTEOARTHRITIS   . BICEPS TENDON RUPTURE, RIGHT   . Tubular adenoma of colon 06/2006   Current Outpatient Prescriptions on File Prior to Visit  Medication Sig Dispense Refill  . losartan-hydrochlorothiazide (HYZAAR) 100-25 MG per tablet Take 1 tablet by mouth daily. 90 tablet 3  . pantoprazole (PROTONIX) 40 MG tablet Take 1 tablet (40 mg total) by mouth daily. 90 tablet 3  . simvastatin (ZOCOR) 40 MG tablet Take 1 tablet (40 mg total) by mouth at bedtime. 90 tablet 3  . aspirin 81 MG EC tablet Take 1 tablet (81 mg total) by mouth daily. Swallow whole. (Patient not taking: Reported on 08/28/2014) 30 tablet 12  . doxycycline (VIBRA-TABS) 100 MG tablet Take 1  tablet (100 mg total) by mouth 2 (two) times daily. (Patient not taking: Reported on 08/28/2014) 20 tablet 0  . doxycycline (VIBRA-TABS) 100 MG tablet Take 1 tablet (100 mg total) by mouth 2 (two) times daily. (Patient not taking: Reported on 08/28/2014) 20 tablet 0  . FLUoxetine (PROZAC) 40 MG capsule Take 1 capsule (40 mg total) by mouth daily. 90 capsule 3  . peg 3350 powder (MOVIPREP) 100 G SOLR Take 1 kit (100 g total) by mouth once. (Patient not taking: Reported on 08/28/2014) 1 kit 0   No current facility-administered medications on file prior to visit.   Family History  Problem Relation Age of Onset  . Kidney disease Sister   . Stroke Mother    History   Social History  . Marital Status: Single    Spouse Name: N/A  . Number of Children: N/A  . Years of Education: N/A   Occupational History  . unemployed    Social History Main Topics  . Smoking status: Current Every Day Smoker  . Smokeless tobacco: Never Used  . Alcohol Use: Yes  . Drug Use: No  . Sexual Activity: Not on file   Other Topics Concern  . Not on file   Social History Narrative    Review of Systems: See HPI  Objective:  Filed Vitals:   08/28/14 1502  BP: 144/94  Pulse: 105  Temp: 98.4 F (36.9 C)  Resp: 14    Physical Exam  Constitutional: He is oriented to person, place, and time.  Cardiovascular: Normal rate, regular rhythm and normal heart sounds.   Pulmonary/Chest: Effort normal and breath sounds normal.  Abdominal: Soft.  Genitourinary: Testes normal. Cremasteric reflex is present. Right testis shows no tenderness. Left testis shows no tenderness. Penile erythema present.     Intertrigo   Musculoskeletal: He exhibits no edema or tenderness.  Lymphadenopathy: No inguinal adenopathy noted on the right or left side.  Neurological: He is alert and oriented to person, place, and time.  Skin: Rash noted.  Psychiatric: He has a normal mood and affect.     Lab Results  Component Value  Date   WBC 6.9 08/16/2013   HGB 14.2 08/16/2013   HCT 40.5 08/16/2013   MCV 92.9 08/16/2013   PLT 156 08/16/2013   Lab Results  Component Value Date   CREATININE 0.82 08/16/2013   BUN 17 08/16/2013   NA 138 08/16/2013   K 3.9 08/16/2013   CL 103 08/16/2013   CO2 25 08/16/2013    Lab Results  Component Value Date   HGBA1C 6.0 08/29/2012   Lipid Panel     Component Value Date/Time   CHOL 207* 08/29/2012 1520   TRIG 162.0* 08/29/2012 1520   TRIG 70 07/18/2006 1301   HDL 67.70 08/29/2012 1520   CHOLHDL 3 08/29/2012 1520   CHOLHDL 2.6 CALC 07/18/2006 1301   VLDL 32.4 08/29/2012 1520   LDLCALC 93 12/23/2008 1536       Assessment and plan:   Richard Calhoun was seen today for establish care.  Diagnoses and all orders for this visit:  Essential hypertension Orders: -     Refill losartan-hydrochlorothiazide (HYZAAR) 100-25 MG per tablet; Take 1 tablet by mouth daily. Will need to monitor pressure more before determining control   Chronic obstructive pulmonary disease, unspecified COPD, unspecified chronic bronchitis type Orders: -     Refill Fluticasone-Salmeterol (ADVAIR) 100-50 MCG/DOSE AEPB; Inhale 1 puff into the lungs 2 (two) times daily. Smoking status address.  HLD (hyperlipidemia) Orders: -     Refill simvastatin (ZOCOR) 40 MG tablet; Take 1 tablet (40 mg total) by mouth at bedtime.  Intertrigo Orders: -     Begin ketoconazole (NIZORAL) 2 % cream; Apply 1 application topically 2 (two) times daily. In groin and bilateral underarms Addressed ways to prevent infection  Smoker Smoking cessation discussed for 3 minutes, patient is not willing to quit at this time. Will continue to assess on each visit. Discussed increased risk for diseases such as cancer, heart disease, and stroke.   Need for prophylactic vaccination against Streptococcus pneumoniae (pneumococcus) -     Pneumococcal polysaccharide vaccine 23-valent greater than or equal to 2yo subcutaneous/IM Patient  will need influenza, out of stock today.  F/u in 3 months for HTN.       Richard Manning, NP-C [facility] [phone] 08/28/2014, 3:31 PM

## 2014-08-28 NOTE — Patient Instructions (Signed)
Chronic Obstructive Pulmonary Disease °Chronic obstructive pulmonary disease (COPD) is a common lung condition in which airflow from the lungs is limited. COPD is a general term that can be used to describe many different lung problems that limit airflow, including both chronic bronchitis and emphysema.  If you have COPD, your lung function will probably never return to normal, but there are measures you can take to improve lung function and make yourself feel better.  °CAUSES  °· Smoking (common).   °· Exposure to secondhand smoke.   °· Genetic problems. °· Chronic inflammatory lung diseases or recurrent infections. °SYMPTOMS  °· Shortness of breath, especially with physical activity.   °· Deep, persistent (chronic) cough with a large amount of thick mucus.   °· Wheezing.   °· Rapid breaths (tachypnea).   °· Gray or bluish discoloration (cyanosis) of the skin, especially in fingers, toes, or lips.   °· Fatigue.   °· Weight loss.   °· Frequent infections or episodes when breathing symptoms become much worse (exacerbations).   °· Chest tightness. °DIAGNOSIS  °Your health care provider will take a medical history and perform a physical examination to make the initial diagnosis.  Additional tests for COPD may include:  °· Lung (pulmonary) function tests. °· Chest X-ray. °· CT scan. °· Blood tests. °TREATMENT  °Treatment available to help you feel better when you have COPD includes:  °· Inhaler and nebulizer medicines. These help manage the symptoms of COPD and make your breathing more comfortable. °· Supplemental oxygen. Supplemental oxygen is only helpful if you have a low oxygen level in your blood.   °· Exercise and physical activity. These are beneficial for nearly all people with COPD. Some people may also benefit from a pulmonary rehabilitation program. °HOME CARE INSTRUCTIONS  °· Take all medicines (inhaled or pills) as directed by your health care provider. °· Avoid over-the-counter medicines or cough syrups  that dry up your airway (such as antihistamines) and slow down the elimination of secretions unless instructed otherwise by your health care provider.   °· If you are a smoker, the most important thing that you can do is stop smoking. Continuing to smoke will cause further lung damage and breathing trouble. Ask your health care provider for help with quitting smoking. He or she can direct you to community resources or hospitals that provide support. °· Avoid exposure to irritants such as smoke, chemicals, and fumes that aggravate your breathing. °· Use oxygen therapy and pulmonary rehabilitation if directed by your health care provider. If you require home oxygen therapy, ask your health care provider whether you should purchase a pulse oximeter to measure your oxygen level at home.   °· Avoid contact with individuals who have a contagious illness. °· Avoid extreme temperature and humidity changes. °· Eat healthy foods. Eating smaller, more frequent meals and resting before meals may help you maintain your strength. °· Stay active, but balance activity with periods of rest. Exercise and physical activity will help you maintain your ability to do things you want to do. °· Preventing infection and hospitalization is very important when you have COPD. Make sure to receive all the vaccines your health care provider recommends, especially the pneumococcal and influenza vaccines. Ask your health care provider whether you need a pneumonia vaccine. °· Learn and use relaxation techniques to manage stress. °· Learn and use controlled breathing techniques as directed by your health care provider. Controlled breathing techniques include:   °· Pursed lip breathing. Start by breathing in (inhaling) through your nose for 1 second. Then, purse your lips as if you were   going to whistle and breathe out (exhale) through the pursed lips for 2 seconds.   °· Diaphragmatic breathing. Start by putting one hand on your abdomen just above  your waist. Inhale slowly through your nose. The hand on your abdomen should move out. Then purse your lips and exhale slowly. You should be able to feel the hand on your abdomen moving in as you exhale.   °· Learn and use controlled coughing to clear mucus from your lungs. Controlled coughing is a series of short, progressive coughs. The steps of controlled coughing are:   °· Lean your head slightly forward.   °· Breathe in deeply using diaphragmatic breathing.   °· Try to hold your breath for 3 seconds.   °· Keep your mouth slightly open while coughing twice.   °· Spit any mucus out into a tissue.   °· Rest and repeat the steps once or twice as needed. °SEEK MEDICAL CARE IF:  °· You are coughing up more mucus than usual.   °· There is a change in the color or thickness of your mucus.   °· Your breathing is more labored than usual.   °· Your breathing is faster than usual.   °SEEK IMMEDIATE MEDICAL CARE IF:  °· You have shortness of breath while you are resting.   °· You have shortness of breath that prevents you from: °· Being able to talk.   °· Performing your usual physical activities.   °· You have chest pain lasting longer than 5 minutes.   °· Your skin color is more cyanotic than usual. °· You measure low oxygen saturations for longer than 5 minutes with a pulse oximeter. °MAKE SURE YOU:  °· Understand these instructions. °· Will watch your condition. °· Will get help right away if you are not doing well or get worse. °Document Released: 04/14/2005 Document Revised: 11/19/2013 Document Reviewed: 03/01/2013 °Exit[employer]® Patient Information ©2015 [employer]. This information is not intended to replace advice given to you by your health care provider. Make sure you discuss any questions you have with your health care provider. °Smoking Cessation °Quitting smoking is important to your health and has many advantages. However, it is not always easy to quit since nicotine is a very addictive drug. Oftentimes,  people try 3 times or more before being able to quit. This document explains the best ways for you to prepare to quit smoking. Quitting takes hard work and a lot of effort, but you can do it. °ADVANTAGES OF QUITTING SMOKING °· You will live longer, feel better, and live better. °· Your body will feel the impact of quitting smoking almost immediately. °¨ Within 20 minutes, blood pressure decreases. Your pulse returns to its normal level. °¨ After 8 hours, carbon monoxide levels in the blood return to normal. Your oxygen level increases. °¨ After 24 hours, the chance of having a heart attack starts to decrease. Your breath, hair, and body stop smelling like smoke. °¨ After 48 hours, damaged nerve endings begin to recover. Your sense of taste and smell improve. °¨ After 72 hours, the body is virtually free of nicotine. Your bronchial tubes relax and breathing becomes easier. °¨ After 2 to 12 weeks, lungs can hold more air. Exercise becomes easier and circulation improves. °· The risk of having a heart attack, stroke, cancer, or lung disease is greatly reduced. °¨ After 1 year, the risk of coronary heart disease is cut in half. °¨ After 5 years, the risk of stroke falls to the same as a nonsmoker. °¨ After 10 years, the risk of lung cancer is   cut in half and the risk of other cancers decreases significantly. °¨ After 15 years, the risk of coronary heart disease drops, usually to the level of a nonsmoker. °· If you are pregnant, quitting smoking will improve your chances of having a healthy baby. °· The people you live with, especially any children, will be healthier. °· You will have extra money to spend on things other than cigarettes. °QUESTIONS TO THINK ABOUT BEFORE ATTEMPTING TO QUIT °You may want to talk about your answers with your health care provider. °· Why do you want to quit? °· If you tried to quit in the past, what helped and what did not? °· What will be the most difficult situations for you after you  quit? How will you plan to handle them? °· Who can help you through the tough times? Your family? Friends? A health care provider? °· What pleasures do you get from smoking? What ways can you still get pleasure if you quit? °Here are some questions to ask your health care provider: °· How can you help me to be successful at quitting? °· What medicine do you think would be best for me and how should I take it? °· What should I do if I need more help? °· What is smoking withdrawal like? How can I get information on withdrawal? °GET READY °· Set a quit date. °· Change your environment by getting rid of all cigarettes, ashtrays, matches, and lighters in your home, car, or work. Do not let people smoke in your home. °· Review your past attempts to quit. Think about what worked and what did not. °GET SUPPORT AND ENCOURAGEMENT °You have a better chance of being successful if you have help. You can get support in many ways. °· Tell your family, friends, and coworkers that you are going to quit and need their support. Ask them not to smoke around you. °· Get individual, group, or telephone counseling and support. Programs are available at local hospitals and health centers. Call your local health department for information about programs in your area. °· Spiritual beliefs and practices may help some smokers quit. °· Download a "quit meter" on your computer to keep track of quit statistics, such as how long you have gone without smoking, cigarettes not smoked, and money saved. °· Get a self-help book about quitting smoking and staying off tobacco. °LEARN NEW SKILLS AND BEHAVIORS °· Distract yourself from urges to smoke. Talk to someone, go for a walk, or occupy your time with a task. °· Change your normal routine. Take a different route to work. Drink tea instead of coffee. Eat breakfast in a different place. °· Reduce your stress. Take a hot bath, exercise, or read a book. °· Plan something enjoyable to do every day. Reward  yourself for not smoking. °· Explore interactive web-based programs that specialize in helping you quit. °GET MEDICINE AND USE IT CORRECTLY °Medicines can help you stop smoking and decrease the urge to smoke. Combining medicine with the above behavioral methods and support can greatly increase your chances of successfully quitting smoking. °· Nicotine replacement therapy helps deliver nicotine to your body without the negative effects and risks of smoking. Nicotine replacement therapy includes nicotine gum, lozenges, inhalers, nasal sprays, and skin patches. Some may be available over-the-counter and others require a prescription. °· Antidepressant medicine helps people abstain from smoking, but how this works is unknown. This medicine is available by prescription. °· Nicotinic receptor partial agonist medicine simulates the effect of   nicotine in your brain. This medicine is available by prescription. °Ask your health care provider for advice about which medicines to use and how to use them based on your health history. Your health care provider will tell you what side effects to look out for if you choose to be on a medicine or therapy. Carefully read the information on the package. Do not use any other product containing nicotine while using a nicotine replacement product.  °RELAPSE OR DIFFICULT SITUATIONS °Most relapses occur within the first 3 months after quitting. Do not be discouraged if you start smoking again. Remember, most people try several times before finally quitting. You may have symptoms of withdrawal because your body is used to nicotine. You may crave cigarettes, be irritable, feel very hungry, cough often, get headaches, or have difficulty concentrating. The withdrawal symptoms are only temporary. They are strongest when you first quit, but they will go away within 10-14 days. °To reduce the chances of relapse, try to: °· Avoid drinking alcohol. Drinking lowers your chances of successfully  quitting. °· Reduce the amount of caffeine you consume. Once you quit smoking, the amount of caffeine in your body increases and can give you symptoms, such as a rapid heartbeat, sweating, and anxiety. °· Avoid smokers because they can make you want to smoke. °· Do not let weight gain distract you. Many smokers will gain weight when they quit, usually less than 10 pounds. Eat a healthy diet and stay active. You can always lose the weight gained after you quit. °· Find ways to improve your mood other than smoking. °FOR MORE INFORMATION  °www.smokefree.gov  °Document Released: 06/29/2001 Document Revised: 11/19/2013 Document Reviewed: 10/14/2011 °[employer]® Patient Information ©2015 [employer]. This information is not intended to replace advice given to you by your health care provider. Make sure you discuss any questions you have with your health care provider. ° °

## 2014-08-28 NOTE — Progress Notes (Signed)
F/U establish care. Pt has a history of HTN and hyperlipidemia. Pt is c/o a rash under his left armpit and on b/l groin.

## 2014-09-05 ENCOUNTER — Telehealth: Payer: Self-pay | Admitting: Internal Medicine

## 2014-09-05 ENCOUNTER — Telehealth: Payer: Self-pay | Admitting: *Deleted

## 2014-09-05 MED ORDER — FLUCONAZOLE 150 MG PO TABS: 150.0000 mg | ORAL_TABLET | Freq: Every day | ORAL | Status: DC

## 2014-09-05 NOTE — Telephone Encounter (Signed)
Patient called stating that the medication he was prescribed for his rash is not helping. Patient stated that he would like something else to treat. Please f/u with pt.

## 2014-09-05 NOTE — Telephone Encounter (Signed)
May send diflucan 150 mg PO tablets to daily for 4 days. 4 tablets with no refills

## 2014-09-05 NOTE — Telephone Encounter (Signed)
Pt stated rash getting worst itching and red. Pt will like to get stronger ointment or antibiotic

## 2014-09-05 NOTE — Addendum Note (Signed)
Addended by: Betti Cruz on: 09/05/2014 02:39 PM   Modules accepted: Orders

## 2014-09-05 NOTE — Telephone Encounter (Signed)
Pt aware, Rx send to [facility]

## 2014-09-11 ENCOUNTER — Telehealth: Payer: Self-pay | Admitting: General Practice

## 2014-09-11 NOTE — Telephone Encounter (Signed)
Pt advised to schedule f/u appointment with PCP

## 2014-09-11 NOTE — Telephone Encounter (Signed)
Patient was in to establish care on 2/10. Patient was seen for rash at that appt. And was prescribed cream to treat the rash. Patient states that the rash has not improved, since using the cream. Patient is requesting to speak to nurse and requesting a different cream. Please assist.

## 2014-09-12 ENCOUNTER — Encounter: Payer: Self-pay | Admitting: Internal Medicine

## 2014-09-12 ENCOUNTER — Ambulatory Visit: Payer: Self-pay | Attending: Internal Medicine | Admitting: Internal Medicine

## 2014-09-12 VITALS — BP 146/99 | HR 98 | Temp 98.3°F | Resp 18 | Ht 67.0 in | Wt 197.0 lb

## 2014-09-12 DIAGNOSIS — L304 Erythema intertrigo: Secondary | ICD-10-CM | POA: Insufficient documentation

## 2014-09-12 MED ORDER — NYSTATIN 100000 UNIT/GM EX CREA: 1.0000 "application " | TOPICAL_CREAM | Freq: Two times a day (BID) | CUTANEOUS | Status: DC

## 2014-09-12 MED ORDER — TRIAMCINOLONE ACETONIDE 0.025 % EX CREA: 1.0000 "application " | TOPICAL_CREAM | Freq: Two times a day (BID) | CUTANEOUS | Status: DC

## 2014-09-12 NOTE — Progress Notes (Signed)
   Subjective:    Patient ID: Richard Calhoun, male    DOB: 09-16-1960, 54 y.o.   MRN: [id number]  Rash This is a recurrent problem. The current episode started 1 to 4 weeks ago. The affected locations include the groin and genitalia. The rash is characterized by burning, redness, itchiness and blistering. He was exposed to nothing. Pertinent negatives include no fatigue, fever or joint pain. Past treatments include anti-itch cream (ketoconazole). The treatment provided no relief.      Review of Systems  Constitutional: Negative for fever and fatigue.  Musculoskeletal: Negative for joint pain.  Skin: Positive for rash.  All other systems reviewed and are negative.      Objective:   Physical Exam  Cardiovascular: Normal rate, normal heart sounds and intact distal pulses.   Pulmonary/Chest: Effort normal and breath sounds normal.  Skin: Rash (severe intertrigo in groin, scrotom, penis) noted.       Assessment & Plan:  Richard Calhoun was seen today for follow-up and rash.  Diagnoses and all orders for this visit:  Intertrigo Orders: -     nystatin cream (MYCOSTATIN); Apply 1 application topically 2 (two) times daily. -     triamcinolone (KENALOG) 0.025 % cream; Apply 1 application topically 2 (two) times daily. -     HIV antibody (with reflex)  Return if symptoms worsen or fail to improve.  Richard Manning, NP 10/01/2014 6:11 PM

## 2014-09-12 NOTE — Progress Notes (Signed)
Pt here with continued rash unrelieved with prescribed cream/powders States left arm pit is worse than R Slight itching noted with minimal pain No oozing noted States itching worsens at night Declined smoking cessation

## 2014-09-12 NOTE — Patient Instructions (Signed)
Intertrigo Intertrigo is a skin condition that occurs in between folds of skin in places on the body that rub together a lot and do not get much ventilation. It is caused by heat, moisture, friction, sweat retention, and lack of air circulation, which produces red, irritated patches and, sometimes, scaling or drainage. People who have diabetes, who are obese, or who have treatment with antibiotics are at increased risk for intertrigo. The most common sites for intertrigo to occur include:  The groin.  The breasts.  The armpits.  Folds of abdominal skin.  Webbed spaces between the fingers or toes. Intertrigo may be aggravated by:  Sweat.  Feces.  Yeast or bacteria that are present near skin folds.  Urine.  Vaginal discharge. HOME CARE INSTRUCTIONS  The following steps can be taken to reduce friction and keep the affected area cool and dry:  Expose skin folds to the air.  Keep deep skin folds separated with cotton or linen cloth. Avoid tight fitting clothing that could cause chafing.  Wear open-toed shoes or sandals to help reduce moisture between the toes.  Apply absorbent powders to affected areas as directed by your caregiver.  Apply over-the-counter barrier pastes, such as zinc oxide, as directed by your caregiver.  If you develop a fungal infection in the affected area, your caregiver may have you use antifungal creams. SEEK MEDICAL CARE IF:   The rash is not improving after 1 week of treatment.  The rash is getting worse (more red, more swollen, more painful, or spreading).  You have a fever or chills. MAKE SURE YOU:   Understand these instructions.  Will watch your condition.  Will get help right away if you are not doing well or get worse. Document Released: 07/05/2005 Document Revised: 09/27/2011 Document Reviewed: 12/18/2009 Exit[employer] Patient Information 2015 [employer]. This information is not intended to replace advice given to you by your health  care provider. Make sure you discuss any questions you have with your health care provider.  

## 2014-09-13 LAB — HIV ANTIBODY (ROUTINE TESTING W REFLEX): HIV: NONREACTIVE

## 2014-09-16 ENCOUNTER — Telehealth: Payer: Self-pay | Admitting: *Deleted

## 2014-09-16 NOTE — Telephone Encounter (Signed)
Left voice message to return call 

## 2014-09-16 NOTE — Telephone Encounter (Signed)
-----   Message from Lance Bosch, NP sent at 09/15/2014  7:11 PM EST ----- Negative for HIV

## 2014-09-17 ENCOUNTER — Telehealth: Payer: Self-pay | Admitting: *Deleted

## 2014-09-17 NOTE — Telephone Encounter (Signed)
Pt call back for lab results, results given

## 2014-09-23 ENCOUNTER — Other Ambulatory Visit: Payer: Self-pay | Admitting: Internal Medicine

## 2014-09-23 ENCOUNTER — Telehealth: Payer: Self-pay | Admitting: Internal Medicine

## 2014-09-23 MED ORDER — PANTOPRAZOLE SODIUM 40 MG PO TBEC: 40.0000 mg | DELAYED_RELEASE_TABLET | Freq: Every day | ORAL | Status: DC

## 2014-09-23 NOTE — Telephone Encounter (Signed)
I spoke with the pt and advised that the losartan and zocor he has refills available at the pharmacy for these. I advised I sent in the protonix today. He will come by tomorrow to get medications.  Bing, CMA

## 2014-09-23 NOTE — Telephone Encounter (Signed)
Pt is requesting a refill on the following. Please follow up with pt.   losartan-hydrochlorothiazide (HYZAAR) 100-25 MG per tablet simvastatin (ZOCOR) 40 MG tablet pantoprazole (PROTONIX) 40 MG tablet

## 2014-10-01 ENCOUNTER — Telehealth: Payer: Self-pay | Admitting: Internal Medicine

## 2014-10-01 NOTE — Telephone Encounter (Signed)
Called patient to see if the new cream has helped with his intertrigo.  Patient reports that the cream has helped some and it is no longer itchy. He notes that he continues to have redness and moisture in his groin.  I have explained to patient that if he is no better in one week of continued cream use then he can call Nurse Rachel Bo and I will give him further directions.

## 2014-10-09 ENCOUNTER — Ambulatory Visit: Payer: Self-pay

## 2015-07-24 MED FILL — ?PANTOPRAZOLE SOD DR 40MG: 40 MG | 30 days supply | Qty: 30 | Fill #8

## 2015-07-24 MED FILL — LOSARTAN-HCTZ 100-25 MG TAB: 100-25 | 30 days supply | Qty: 30 | Fill #8

## 2015-07-24 MED FILL — SIMVASTATIN 40 MG TABLET: 40 | 30 days supply | Qty: 30 | Fill #8

## 2015-08-25 MED FILL — LOSARTAN-HCTZ 100-25 MG TAB: 100-25 | 30 days supply | Qty: 30 | Fill #9

## 2015-08-25 MED FILL — SIMVASTATIN 40 MG TABLET: 40 | 30 days supply | Qty: 30 | Fill #9

## 2015-08-25 MED FILL — ?PANTOPRAZOLE SOD DR 40MG: 40 MG | 30 days supply | Qty: 30 | Fill #9

## 2015-09-15 ENCOUNTER — Telehealth: Payer: Self-pay | Admitting: Internal Medicine

## 2015-09-15 NOTE — Telephone Encounter (Signed)
Pt. Called stating that he came in a year ago for a rash and he stated he has the exact same rash. Pt. Would like to know is he can get an Rx for the  Rash. Please f/u with pt.

## 2015-09-19 ENCOUNTER — Telehealth: Payer: Self-pay

## 2015-09-19 NOTE — Telephone Encounter (Signed)
Patient called needing cream for rash. Please follow up

## 2015-09-19 NOTE — Telephone Encounter (Signed)
Returned patient phone call Patient requesting a cream for his rash Patient has not been seen in a year Are you willing to refill triamcinolone cream or does he need an appointment  So you can see the rash

## 2015-09-22 ENCOUNTER — Telehealth: Payer: Self-pay

## 2015-09-22 NOTE — Telephone Encounter (Signed)
Returned patient phone call Patient not available Message left on voice mail to return our call 

## 2015-09-22 NOTE — Telephone Encounter (Signed)
Pt. Returned call. Please f/u with pt. °

## 2015-09-22 NOTE — Telephone Encounter (Signed)
Returned patient phone call Patient is aware he will need to schedule and appointment in [facility] to get his cream refilled

## 2015-09-22 NOTE — Telephone Encounter (Signed)
Needs a appointment

## 2015-09-25 ENCOUNTER — Other Ambulatory Visit: Payer: Self-pay | Admitting: Internal Medicine

## 2015-09-26 ENCOUNTER — Telehealth: Payer: Self-pay

## 2015-09-26 DIAGNOSIS — I10 Essential (primary) hypertension: Secondary | ICD-10-CM

## 2015-09-26 MED ORDER — LOSARTAN POTASSIUM-HCTZ 100-25 MG PO TABS: 1.0000 | ORAL_TABLET | Freq: Every day | ORAL | Status: DC

## 2015-09-26 MED FILL — LOSARTAN-HCTZ 100-25 MG TAB: 100-25 | 30 days supply | Qty: 30 | Fill #0

## 2015-09-26 NOTE — Telephone Encounter (Signed)
Pt. Called requesting a med refill on losartan-hydrochlorothiazide (HYZAAR) 100-25 MG per tablet. Please f/u with pt.

## 2015-09-26 NOTE — Telephone Encounter (Signed)
Returned patient phone call Patient is requesting a refill on his blood pressure medication A one month supply sent to the pharmacy

## 2015-09-29 ENCOUNTER — Telehealth: Payer: Self-pay

## 2015-09-29 DIAGNOSIS — E785 Hyperlipidemia, unspecified: Secondary | ICD-10-CM

## 2015-09-29 MED ORDER — SIMVASTATIN 40 MG PO TABS: 40.0000 mg | ORAL_TABLET | Freq: Every day | ORAL | Status: DC

## 2015-09-29 MED ORDER — PANTOPRAZOLE SODIUM 40 MG PO TBEC: 40.0000 mg | DELAYED_RELEASE_TABLET | Freq: Every day | ORAL | Status: DC

## 2015-09-29 MED FILL — SIMVASTATIN 40 MG TABLET: 40 | 30 days supply | Qty: 30 | Fill #0

## 2015-09-29 MED FILL — ?PANTOPRAZOLE SOD DR 40MG: 40 MG | 30 days supply | Qty: 30 | Fill #0

## 2015-09-29 NOTE — Telephone Encounter (Signed)
Pt. Called requesting a med refill on the following medications:  simvastatin (ZOCOR) 40 MG tablet pantoprazole (PROTONIX) 40 MG tablet  Please f/u with pt.

## 2015-09-29 NOTE — Telephone Encounter (Signed)
Returned patient phone call Patient requesting a refill on his zocor and protonix rx sent to the pharmacy here

## 2015-10-09 ENCOUNTER — Encounter: Payer: Self-pay | Admitting: Internal Medicine

## 2015-10-09 ENCOUNTER — Ambulatory Visit: Payer: Self-pay | Attending: Internal Medicine | Admitting: Internal Medicine

## 2015-10-09 ENCOUNTER — Encounter: Payer: Self-pay | Admitting: Clinical

## 2015-10-09 VITALS — BP 147/82 | HR 98 | Temp 98.7°F | Resp 16 | Ht 67.0 in | Wt 200.8 lb

## 2015-10-09 DIAGNOSIS — L304 Erythema intertrigo: Secondary | ICD-10-CM | POA: Insufficient documentation

## 2015-10-09 DIAGNOSIS — Z760 Encounter for issue of repeat prescription: Secondary | ICD-10-CM | POA: Insufficient documentation

## 2015-10-09 DIAGNOSIS — R21 Rash and other nonspecific skin eruption: Secondary | ICD-10-CM | POA: Insufficient documentation

## 2015-10-09 DIAGNOSIS — F17210 Nicotine dependence, cigarettes, uncomplicated: Secondary | ICD-10-CM | POA: Insufficient documentation

## 2015-10-09 DIAGNOSIS — E785 Hyperlipidemia, unspecified: Secondary | ICD-10-CM | POA: Insufficient documentation

## 2015-10-09 DIAGNOSIS — Z7982 Long term (current) use of aspirin: Secondary | ICD-10-CM | POA: Insufficient documentation

## 2015-10-09 DIAGNOSIS — Z0000 Encounter for general adult medical examination without abnormal findings: Secondary | ICD-10-CM

## 2015-10-09 DIAGNOSIS — I10 Essential (primary) hypertension: Secondary | ICD-10-CM | POA: Insufficient documentation

## 2015-10-09 DIAGNOSIS — Z79899 Other long term (current) drug therapy: Secondary | ICD-10-CM | POA: Insufficient documentation

## 2015-10-09 DIAGNOSIS — F17200 Nicotine dependence, unspecified, uncomplicated: Secondary | ICD-10-CM

## 2015-10-09 DIAGNOSIS — K219 Gastro-esophageal reflux disease without esophagitis: Secondary | ICD-10-CM | POA: Insufficient documentation

## 2015-10-09 LAB — LIPID PANEL
Cholesterol: 205 mg/dL — ABNORMAL HIGH (ref 125–200)
HDL: 65 mg/dL (ref >=40)
LDL Cholesterol: 85 mg/dL (ref <130)
Total CHOL/HDL Ratio: 3.2 Ratio (ref <=5.0)
Triglycerides: 277 mg/dL — ABNORMAL HIGH (ref <150)
VLDL: 55 mg/dL — ABNORMAL HIGH (ref <30)

## 2015-10-09 LAB — BASIC METABOLIC PANEL
BUN: 26 mg/dL — ABNORMAL HIGH (ref 7–25)
CALCIUM: 9.9 mg/dL (ref 8.6–10.3)
CHLORIDE: 104 mmol/L (ref 98–110)
CO2: 22 mmol/L (ref 20–31)
Creat: 1.11 mg/dL (ref 0.70–1.33)
GLUCOSE: 113 mg/dL — AB (ref 65–99)
POTASSIUM: 3.4 mmol/L — AB (ref 3.5–5.3)
SODIUM: 140 mmol/L (ref 135–146)

## 2015-10-09 MED ORDER — TRIAMCINOLONE ACETONIDE 0.025 % EX CREA: 1.0000 "application " | TOPICAL_CREAM | Freq: Two times a day (BID) | CUTANEOUS | Status: DC

## 2015-10-09 MED ORDER — LOSARTAN POTASSIUM-HCTZ 100-25 MG PO TABS: 1.0000 | ORAL_TABLET | Freq: Every day | ORAL | Status: DC

## 2015-10-09 MED ORDER — PANTOPRAZOLE SODIUM 40 MG PO TBEC: 40.0000 mg | DELAYED_RELEASE_TABLET | Freq: Every day | ORAL | Status: DC

## 2015-10-09 MED ORDER — SIMVASTATIN 40 MG PO TABS: 40.0000 mg | ORAL_TABLET | Freq: Every day | ORAL | Status: DC

## 2015-10-09 MED ORDER — NYSTATIN 100000 UNIT/GM EX CREA: 1.0000 "application " | TOPICAL_CREAM | Freq: Two times a day (BID) | CUTANEOUS | Status: DC

## 2015-10-09 MED FILL — LOSARTAN-HCTZ 100-25 MG TAB: 100-25 | 30 days supply | Qty: 30 | Fill #0

## 2015-10-09 MED FILL — SIMVASTATIN 40 MG TABLET: 40 | 30 days supply | Qty: 30 | Fill #0

## 2015-10-09 MED FILL — NYSTATIN 100,000 UNIT/GM CR: 100000 | 14 days supply | Qty: 30 | Fill #0

## 2015-10-09 MED FILL — PANTOPRAZOLE SOD DR 40 MG T: 40 | 30 days supply | Qty: 30 | Fill #0

## 2015-10-09 MED FILL — TRIAMCINOLONE 0.025% CREAM: 0.025 | 15 days supply | Qty: 30 | Fill #0

## 2015-10-09 NOTE — Progress Notes (Signed)
Patient ID: Richard Calhoun, male   DOB: 05/17/1961, 55 y.o.   MRN: [id number] Subjective:  Richard Calhoun is a 55 y.o. male with hypertension, GERD, HLD, and tobacco use. Patient reports that he has taken his blood pressure medication today but he smoked a cigarette before he walked in the building. He has not been seen here in over one year.He denies any complaints of chest pain, headaches, edema, palpitations. He smokes 1 ppd with no plans to quit now. Patient states that he has recently seen the same rash in his groin from last year and would like a refill of his nystatin cream. He has no other complaints today.  Current Outpatient Prescriptions  Medication Sig Dispense Refill  . aspirin 81 MG EC tablet Take 1 tablet (81 mg total) by mouth daily. Swallow whole. (Patient not taking: Reported on 08/28/2014) 30 tablet 12  . losartan-hydrochlorothiazide (HYZAAR) 100-25 MG tablet Take 1 tablet by mouth daily. 30 tablet 5  . nystatin cream (MYCOSTATIN) Apply 1 application topically 2 (two) times daily. 30 g 0  . pantoprazole (PROTONIX) 40 MG tablet Take 1 tablet (40 mg total) by mouth daily. 30 tablet 5  . simvastatin (ZOCOR) 40 MG tablet Take 1 tablet (40 mg total) by mouth at bedtime. 30 tablet 5  . triamcinolone (KENALOG) 0.025 % cream Apply 1 application topically 2 (two) times daily. 30 g 0   No current facility-administered medications for this visit.  Review of Systems: Other than what is stated in HPI, all other systems are negative.   Objective:  There were no vitals taken for this visit.  Appearance alert, well appearing, and in no distress, oriented to person, place, and time and overweight. General exam BP noted to be well controlled today in office, S1, S2 normal, no gallop, no murmur, chest clear, no JVD, no HSM, no edema.  Lab review: orders written for new lab studies as appropriate; see orders.   Assessment:   Richard Calhoun was seen today for medication refill and hypertension.  Diagnoses  and all orders for this visit:  Essential hypertension -     losartan-hydrochlorothiazide (HYZAAR) 100-25 MG tablet; Take 1 tablet by mouth daily. -     Basic Metabolic Panel Patient blood pressure is stable and may continue on current medication.  Education on diet, exercise, and modifiable risk factors discussed. Will obtain appropriate labs as needed. Will follow up in 3-6 months.  Smoking cessation encouraged  HLD (hyperlipidemia) -     simvastatin (ZOCOR) 40 MG tablet; Take 1 tablet (40 mg total) by mouth at bedtime. -     Lipid panel Education provided on proper lifestyle changes in order to lower cholesterol. Patient advised to maintain healthy weight and to keep total fat intake at 25-35% of total calories and carbohydrates 50-60% of total daily calories. Explained how high cholesterol places patient at risk for heart disease. Patient placed on appropriate medication and repeat labs in 6 months   Gastroesophageal reflux disease, esophagitis presence not specified -     pantoprazole (PROTONIX) 40 MG tablet; Take 1 tablet (40 mg total) by mouth daily. Discussed diet and weight with patient relating to acid reflux.  Went over things that may exacerbate acid reflux such as tomatoes, spicy foods, coffee, carbonated beverages, chocolates, etc.  Advised patient to avoid laying down at least two hours after meals and sleep with HOB elevated.  Stable, meds refilled  Intertrigo -     nystatin cream (MYCOSTATIN); Apply 1 application topically  2 (two) times daily. Stay clean and dry.   Tobacco use disorder Smoking cessation discussed for 3 minutes, patient is not willing to quit at this time. Will continue to assess on each visit. Discussed increased risk for diseases such as cancer, heart disease, and stroke.   Healthcare maintenance -     Flu Vaccine QUAD 36+ mos PF IM (Fluarix & Fluzone Quad PF)  Return in about 3 months (around 01/09/2016) for Hypertension.  Lance Bosch,  NP 10/09/2015 5:25 PM

## 2015-10-09 NOTE — Progress Notes (Signed)
Patient's here for medication refills, and f/up HTN.  Patient states having a rash on scrotum  Patient reports not taking meds this morning

## 2015-10-09 NOTE — Progress Notes (Signed)
Depression screen [facility] 2/9 10/09/2015 10/09/2015 08/28/2014  Decreased Interest 1 1 0  Down, Depressed, Hopeless 1 0 0  PHQ - 2 Score 2 1 0  Altered sleeping 2 - -  Tired, decreased energy 1 - -  Change in appetite 0 - -  Feeling bad or failure about yourself  0 - -  Trouble concentrating 0 - -  Moving slowly or fidgety/restless 0 - -  Suicidal thoughts 0 - -  PHQ-9 Score 5 - -    GAD 7 : Generalized Anxiety Score 10/09/2015  Nervous, Anxious, on Edge 0  Control/stop worrying 0  Worry too much - different things 0  Trouble relaxing 0  Restless 0  Easily annoyed or irritable 0  Afraid - awful might happen 0  Total GAD 7 Score 0

## 2015-10-13 ENCOUNTER — Other Ambulatory Visit: Payer: Self-pay | Admitting: Internal Medicine

## 2015-10-13 DIAGNOSIS — E781 Pure hyperglyceridemia: Secondary | ICD-10-CM

## 2015-10-13 MED ORDER — OMEGA-3-ACID ETHYL ESTERS 1 G PO CAPS: 2.0000 g | ORAL_CAPSULE | Freq: Two times a day (BID) | ORAL | Status: DC

## 2015-10-14 ENCOUNTER — Telehealth: Payer: Self-pay

## 2015-10-14 NOTE — Telephone Encounter (Signed)
-----   Message from Lance Bosch, NP sent at 10/13/2015  8:49 PM EDT ----- I have added Lovaza to the patients medications to help lower his triglycerides. Please go over things that increase cholesterol levels such as breads pasta, rice, butters, fried foods, etc.Excess alcohol may also increase triglycerides

## 2015-10-14 NOTE — Telephone Encounter (Signed)
Spoke with patient and he is aware of his lab results 

## 2015-12-01 MED FILL — LOSARTAN-HCTZ 100-25 MG TAB: 100-25 | 30 days supply | Qty: 30 | Fill #1

## 2015-12-01 MED FILL — PANTOPRAZOLE SOD DR 40 MG T: 40 | 30 days supply | Qty: 30 | Fill #1

## 2015-12-01 MED FILL — SIMVASTATIN 40 MG TABLET: 40 | 30 days supply | Qty: 30 | Fill #1

## 2015-12-31 MED FILL — LOSARTAN-HCTZ 100-25 MG TAB: 100-25 | 30 days supply | Qty: 30 | Fill #2

## 2015-12-31 MED FILL — SIMVASTATIN 40 MG TABLET: 40 | 30 days supply | Qty: 30 | Fill #2

## 2015-12-31 MED FILL — ?PANTOPRAZOLE SOD DR 40MG: 40 MG | 30 days supply | Qty: 30 | Fill #2

## 2016-01-23 MED FILL — SIMVASTATIN 40 MG TABLET: 40 | 30 days supply | Qty: 30 | Fill #3

## 2016-01-23 MED FILL — ?PANTOPRAZOLE SOD DR 40MG: 40 MG | 30 days supply | Qty: 30 | Fill #3

## 2016-01-23 MED FILL — LOSARTAN-HCTZ 100-25 MG TAB: 100-25 | 30 days supply | Qty: 30 | Fill #3

## 2016-02-25 MED FILL — SIMVASTATIN 40 MG TABLET: 40 | 30 days supply | Qty: 30 | Fill #4

## 2016-02-25 MED FILL — ?PANTOPRAZOLE SOD DR 40MG: 40 MG | 30 days supply | Qty: 30 | Fill #4

## 2016-02-25 MED FILL — LOSARTAN-HCTZ 100-25 MG TAB: 100-25 | 30 days supply | Qty: 30 | Fill #4

## 2016-03-29 MED FILL — SIMVASTATIN 40 MG TABLET: 40 | 30 days supply | Qty: 30 | Fill #5

## 2016-03-29 MED FILL — ?LOSARTAN-HCTZ 100-25 MG TA: 100MG-25MG | 30 days supply | Qty: 30 | Fill #5

## 2016-03-29 MED FILL — ?PANTOPRAZOLE SOD DR 40MG: 40 MG | 30 days supply | Qty: 30 | Fill #5

## 2016-04-30 ENCOUNTER — Other Ambulatory Visit: Payer: Self-pay | Admitting: Internal Medicine

## 2016-04-30 DIAGNOSIS — K219 Gastro-esophageal reflux disease without esophagitis: Secondary | ICD-10-CM

## 2016-04-30 DIAGNOSIS — E785 Hyperlipidemia, unspecified: Secondary | ICD-10-CM

## 2016-05-05 ENCOUNTER — Other Ambulatory Visit: Payer: Self-pay | Admitting: Internal Medicine

## 2016-05-05 DIAGNOSIS — I10 Essential (primary) hypertension: Secondary | ICD-10-CM

## 2016-05-05 MED FILL — LOSARTAN-HCTZ 100-25 MG TAB: 100-25 | 30 days supply | Qty: 30 | Fill #0

## 2016-05-11 MED FILL — SIMVASTATIN 40 MG TABLET: 40 | 30 days supply | Qty: 30 | Fill #0

## 2016-05-11 MED FILL — PANTOPRAZOLE SOD DR 40 MG T: 40 | 30 days supply | Qty: 30 | Fill #0

## 2016-06-28 ENCOUNTER — Other Ambulatory Visit: Payer: Self-pay | Admitting: Internal Medicine

## 2016-06-28 DIAGNOSIS — E785 Hyperlipidemia, unspecified: Secondary | ICD-10-CM

## 2016-06-28 DIAGNOSIS — K219 Gastro-esophageal reflux disease without esophagitis: Secondary | ICD-10-CM

## 2016-06-28 DIAGNOSIS — I10 Essential (primary) hypertension: Secondary | ICD-10-CM

## 2016-07-02 ENCOUNTER — Telehealth: Payer: Self-pay | Admitting: Internal Medicine

## 2016-07-02 ENCOUNTER — Other Ambulatory Visit: Payer: Self-pay | Admitting: Internal Medicine

## 2016-07-02 DIAGNOSIS — I10 Essential (primary) hypertension: Secondary | ICD-10-CM

## 2016-07-02 DIAGNOSIS — E785 Hyperlipidemia, unspecified: Secondary | ICD-10-CM

## 2016-07-02 DIAGNOSIS — K219 Gastro-esophageal reflux disease without esophagitis: Secondary | ICD-10-CM

## 2016-07-02 NOTE — Telephone Encounter (Signed)
Patient called the office to request medication refills for simvastatin (ZOCOR) 40 MG tablet, pantoprazole (PROTONIX) 40 MG tablet and losartan-hydrochlorothiazide (HYZAAR) 100-25 MG tablet. Pt is aware that he has been here in a while but he stated that he has been going out of town every other month to help his father who is sick. He tried to schedule an appt but there aren't any appt available. Please call rx to our pharmacy.  Thank you.

## 2016-07-05 ENCOUNTER — Other Ambulatory Visit: Payer: Self-pay | Admitting: Internal Medicine

## 2016-07-05 MED FILL — LOSARTAN-HCTZ 100-25 MG TAB: 100-25 | 30 days supply | Qty: 30 | Fill #0

## 2016-07-05 MED FILL — ?PANTOPRAZOLE SOD DR 40MG: 40 MG | 30 days supply | Qty: 30 | Fill #0

## 2016-07-05 MED FILL — SIMVASTATIN 40 MG TABLET: 40 | 30 days supply | Qty: 30 | Fill #0

## 2016-07-05 NOTE — Telephone Encounter (Signed)
I called patient to inform him that he was scheduled an appointment this Thursday Pt does not want to come in due to not having insurance and due to being in the process of collecting documents to apply for financial assistance  Pt was informed that no payment would be due up front at the visit and the office visit bill could potentially be back tracked and covered if approved for 100% financial assistance   Pt is still uneasy about seeing a doctor without coverage due to being in collections from other hospital bills  Pt is asking for a 30 day supply of BP medications to last him until he can apply for financial assistance and then schedule an OV

## 2016-07-05 NOTE — Telephone Encounter (Signed)
30 days medications have been approved.

## 2016-07-05 NOTE — Telephone Encounter (Signed)
Please schedule this patient for appointment on Thursday at 3 pm with me

## 2016-07-08 ENCOUNTER — Ambulatory Visit: Payer: Self-pay | Admitting: Family Medicine

## 2016-08-18 ENCOUNTER — Encounter: Payer: Self-pay | Admitting: Family Medicine

## 2016-08-18 ENCOUNTER — Other Ambulatory Visit: Payer: Self-pay | Admitting: Internal Medicine

## 2016-08-18 ENCOUNTER — Ambulatory Visit: Payer: Self-pay | Attending: Family Medicine | Admitting: Family Medicine

## 2016-08-18 VITALS — BP 107/70 | HR 119 | Temp 98.1°F | Resp 18 | Ht 67.0 in | Wt 196.4 lb

## 2016-08-18 DIAGNOSIS — R0602 Shortness of breath: Secondary | ICD-10-CM | POA: Insufficient documentation

## 2016-08-18 DIAGNOSIS — Z79899 Other long term (current) drug therapy: Secondary | ICD-10-CM | POA: Insufficient documentation

## 2016-08-18 DIAGNOSIS — K439 Ventral hernia without obstruction or gangrene: Secondary | ICD-10-CM | POA: Insufficient documentation

## 2016-08-18 DIAGNOSIS — R05 Cough: Secondary | ICD-10-CM

## 2016-08-18 DIAGNOSIS — F17200 Nicotine dependence, unspecified, uncomplicated: Secondary | ICD-10-CM | POA: Insufficient documentation

## 2016-08-18 DIAGNOSIS — Z8639 Personal history of other endocrine, nutritional and metabolic disease: Secondary | ICD-10-CM

## 2016-08-18 DIAGNOSIS — I10 Essential (primary) hypertension: Secondary | ICD-10-CM | POA: Insufficient documentation

## 2016-08-18 DIAGNOSIS — Z7982 Long term (current) use of aspirin: Secondary | ICD-10-CM | POA: Insufficient documentation

## 2016-08-18 DIAGNOSIS — R053 Chronic cough: Secondary | ICD-10-CM

## 2016-08-18 DIAGNOSIS — E785 Hyperlipidemia, unspecified: Secondary | ICD-10-CM

## 2016-08-18 DIAGNOSIS — J069 Acute upper respiratory infection, unspecified: Secondary | ICD-10-CM | POA: Insufficient documentation

## 2016-08-18 DIAGNOSIS — K219 Gastro-esophageal reflux disease without esophagitis: Secondary | ICD-10-CM | POA: Insufficient documentation

## 2016-08-18 LAB — CBC WITH DIFFERENTIAL/PLATELET
BASOS ABS: 0 {cells}/uL (ref 0–200)
Basophils Relative: 0 %
EOS ABS: 59 {cells}/uL (ref 15–500)
Eosinophils Relative: 1 %
HEMATOCRIT: 36.6 % — AB (ref 38.5–50.0)
HEMOGLOBIN: 13 g/dL — AB (ref 13.2–17.1)
Lymphocytes Relative: 11 %
Lymphs Abs: 649 cells/uL — ABNORMAL LOW (ref 850–3900)
MCH: 35.4 pg — AB (ref 27.0–33.0)
MCHC: 35.5 g/dL (ref 32.0–36.0)
MCV: 99.7 fL (ref 80.0–100.0)
MONO ABS: 590 {cells}/uL (ref 200–950)
MONOS PCT: 10 %
MPV: 10.6 fL (ref 7.5–12.5)
NEUTROS ABS: 4602 {cells}/uL (ref 1500–7800)
Neutrophils Relative %: 78 %
PLATELETS: 144 10*3/uL (ref 140–400)
RBC: 3.67 MIL/uL — ABNORMAL LOW (ref 4.20–5.80)
RDW: 13 % (ref 11.0–15.0)
WBC: 5.9 10*3/uL (ref 3.8–10.8)

## 2016-08-18 MED ORDER — PANTOPRAZOLE SODIUM 40 MG PO TBEC: 40.0000 mg | DELAYED_RELEASE_TABLET | Freq: Every day | ORAL | 1 refills | Status: DC

## 2016-08-18 MED ORDER — LOSARTAN POTASSIUM-HCTZ 100-25 MG PO TABS: 1.0000 | ORAL_TABLET | Freq: Every day | ORAL | 2 refills | Status: DC

## 2016-08-18 MED ORDER — GUAIFENESIN-DM 100-10 MG/5ML PO SYRP: 10.0000 mL | ORAL_SOLUTION | ORAL | 0 refills | Status: DC | PRN

## 2016-08-18 MED ORDER — BENZONATATE 100 MG PO CAPS: 100.0000 mg | ORAL_CAPSULE | Freq: Two times a day (BID) | ORAL | 0 refills | Status: DC | PRN

## 2016-08-18 MED ORDER — ALBUTEROL SULFATE HFA 108 (90 BASE) MCG/ACT IN AERS: 2.0000 | INHALATION_SPRAY | Freq: Four times a day (QID) | RESPIRATORY_TRACT | 0 refills | Status: DC | PRN

## 2016-08-18 MED FILL — BENZONATATE 100 MG CAPSULE: 100 | 15 days supply | Qty: 30 | Fill #0

## 2016-08-18 MED FILL — LOSARTAN-HCTZ 100-25 MG TAB: 100-25 | 30 days supply | Qty: 30 | Fill #0

## 2016-08-18 MED FILL — ?PANTOPRAZOLE SOD DR 40MG: 40 MG | 30 days supply | Qty: 30 | Fill #0

## 2016-08-18 MED FILL — ROBAFEN-DM SYRUP: 100-10 | 6 days supply | Qty: 236 | Fill #0

## 2016-08-18 NOTE — Progress Notes (Signed)
Subjective:  Patient ID: Richard Calhoun, male    DOB: 1961/05/05  Age: 56 y.o. MRN: [id number]  CC: Establish Care and Cough   HPI Richard Calhoun presents for c/o congested cough for 2 weeks. Associated symptoms include rhinorrhea, sore throat.  He states "I feel like I have been gasping for air the last couple days". He denies any history of allergies. He is a everyday smoker and reports a 40 year smoking history. He reports being around his girlfriend who has similar symptoms who was recently diagnosed with a respiratory illness. He is also requesting medication refills.    Outpatient Medications Prior to Visit  Medication Sig Dispense Refill  . omega-3 acid ethyl esters (LOVAZA) 1 g capsule Take 2 capsules (2 g total) by mouth 2 (two) times daily. For cholesterol 120 capsule 3  . losartan-hydrochlorothiazide (HYZAAR) 100-25 MG tablet TAKE 1 TABLET BY MOUTH DAILY. 30 tablet 0  . aspirin 81 MG EC tablet Take 1 tablet (81 mg total) by mouth daily. Swallow whole. (Patient not taking: Reported on 08/28/2014) 30 tablet 12  . nystatin cream (MYCOSTATIN) Apply 1 application topically 2 (two) times daily. (Patient not taking: Reported on 08/18/2016) 30 g 0  . triamcinolone (KENALOG) 0.025 % cream Apply 1 application topically 2 (two) times daily. 30 g 0  . pantoprazole (PROTONIX) 40 MG tablet TAKE 1 TABLET BY MOUTH DAILY. 30 tablet 0  . simvastatin (ZOCOR) 40 MG tablet TAKE 1 TABLET BY MOUTH AT BEDTIME. 30 tablet 0   No facility-administered medications prior to visit.     ROS Review of Systems  Constitutional: Negative.   HENT: Positive for congestion, rhinorrhea and sore throat.   Eyes: Negative.   Respiratory: Positive for shortness of breath.   Cardiovascular: Negative.   Gastrointestinal:       Reports history of hernia.   Objective:  BP 107/70 (BP Location: Left Arm, Patient Position: Sitting, Cuff Size: Normal)   Pulse (!) 119   Temp 98.1 F (36.7 C) (Oral)   Resp 18   Ht 5\' 7"   (1.702 m)   Wt 196 lb 6.4 oz (89.1 kg)   SpO2 (!) 88%   BMI 30.76 kg/m   BP/Weight 08/18/2016 10/09/2015 [id number]  Systolic BP [id number]  Diastolic BP 70 82 99  Wt. (Lbs) 196.4 200.8 197  BMI 30.76 31.44 30.85     Physical Exam  Constitutional: He appears well-developed and well-nourished.  HENT:  Head: Normocephalic.  Right Ear: External ear normal.  Left Ear: External ear normal.  Nose: Rhinorrhea present.  Mouth/Throat: Oropharynx is clear and moist. No oropharyngeal exudate.  Cardiovascular: Normal rate, regular rhythm, normal heart sounds and intact distal pulses.   Pulmonary/Chest: Effort normal and breath sounds normal.  congested cough  Abdominal: Soft. Bowel sounds are normal. He exhibits mass (ventral hernia ).  Nursing note and vitals reviewed.   Assessment & Plan:   Problem List Items Addressed This Visit      Digestive   GERD   Relevant Medications   pantoprazole (PROTONIX) 40 MG tablet    Other Visit Diagnoses    Upper respiratory tract infection, unspecified type    -  Primary   Relevant Medications   guaiFENesin-dextromethorphan (ROBITUSSIN DM) 100-10 MG/5ML syrup   Other Relevant Orders   CBC with Differential (Completed)   DG Chest 2 View   Respiratory virus panel (Completed)   Essential hypertension       Relevant Medications   losartan-hydrochlorothiazide (HYZAAR)  100-25 MG tablet   Other Relevant Orders   BASIC METABOLIC PANEL WITH GFR (Completed)   Microalbumin/Creatinine Ratio, Urine (Completed)   Current smoker       Chronic cough       Relevant Medications   benzonatate (TESSALON) 100 MG capsule   guaiFENesin-dextromethorphan (ROBITUSSIN DM) 100-10 MG/5ML syrup   Shortness of breath       Relevant Medications   albuterol (PROVENTIL HFA;VENTOLIN HFA) 108 (90 Base) MCG/ACT inhaler   Hernia of abdominal wall       Relevant Orders   Ambulatory referral to General Surgery   History of hyperlipidemia       Relevant Orders   Lipid  Panel (Completed)      Meds ordered this encounter  Medications  . benzonatate (TESSALON) 100 MG capsule    Sig: Take 1 capsule (100 mg total) by mouth 2 (two) times daily as needed for cough (Severe cough).    Dispense:  30 capsule    Refill:  0    Order Specific Question:   Supervising Provider    Answer:   Tresa Garter [id number]  . albuterol (PROVENTIL HFA;VENTOLIN HFA) 108 (90 Base) MCG/ACT inhaler    Sig: Inhale 2 puffs into the lungs every 6 (six) hours as needed for wheezing or shortness of breath.    Dispense:  1 Inhaler    Refill:  0    Order Specific Question:   Supervising Provider    Answer:   Tresa Garter [id number]  . guaiFENesin-dextromethorphan (ROBITUSSIN DM) 100-10 MG/5ML syrup    Sig: Take 10 mLs by mouth every 4 (four) hours as needed for cough.    Dispense:  236 mL    Refill:  0    Order Specific Question:   Supervising Provider    Answer:   Tresa Garter [id number]  . losartan-hydrochlorothiazide (HYZAAR) 100-25 MG tablet    Sig: Take 1 tablet by mouth daily.    Dispense:  30 tablet    Refill:  2    Order Specific Question:   Supervising Provider    Answer:   Tresa Garter [id number]  . pantoprazole (PROTONIX) 40 MG tablet    Sig: Take 1 tablet (40 mg total) by mouth daily.    Dispense:  30 tablet    Refill:  1    Order Specific Question:   Supervising Provider    Answer:   Tresa Garter [id number]      Alfonse Spruce FNP

## 2016-08-18 NOTE — Progress Notes (Signed)
Patient is here for establish care  Patient complains cough for two weeks now  Patient complains  Sore chest & stomach due to the cough  Patient notice he been gasping for air for couple days ago  Patient been taking TheraFlu pill

## 2016-08-18 NOTE — Patient Instructions (Signed)
Upper Respiratory Infection, Adult Most upper respiratory infections (URIs) are caused by a virus. A URI affects the nose, throat, and upper air passages. The most common type of URI is often called "the common cold." Follow these instructions at home:  Take medicines only as told by your doctor.  Gargle warm saltwater or take cough drops to comfort your throat as told by your doctor.  Use a warm mist humidifier or inhale steam from a shower to increase air moisture. This may make it easier to breathe.  Drink enough fluid to keep your pee (urine) clear or pale yellow.  Eat soups and other clear broths.  Have a healthy diet.  Rest as needed.  Go back to work when your fever is gone or your doctor says it is okay.  You may need to stay home longer to avoid giving your URI to others.  You can also wear a face mask and wash your hands often to prevent spread of the virus.  Use your inhaler more if you have asthma.  Do not use any tobacco products, including cigarettes, chewing tobacco, or electronic cigarettes. If you need help quitting, ask your doctor. Contact a doctor if:  You are getting worse, not better.  Your symptoms are not helped by medicine.  You have chills.  You are getting more short of breath.  You have brown or red mucus.  You have yellow or brown discharge from your nose.  You have pain in your face, especially when you bend forward.  You have a fever.  You have puffy (swollen) neck glands.  You have pain while swallowing.  You have white areas in the back of your throat. Get help right away if:  You have very bad or constant:  Headache.  Ear pain.  Pain in your forehead, behind your eyes, and over your cheekbones (sinus pain).  Chest pain.  You have long-lasting (chronic) lung disease and any of the following:  Wheezing.  Long-lasting cough.  Coughing up blood.  A change in your usual mucus.  You have a stiff neck.  You have  changes in your:  Vision.  Hearing.  Thinking.  Mood. This information is not intended to replace advice given to you by your health care provider. Make sure you discuss any questions you have with your health care provider. Document Released: 12/22/2007 Document Revised: 03/07/2016 Document Reviewed: 10/10/2013 Elsevier Interactive Patient Education  2017 [employer]  

## 2016-08-19 LAB — MICROALBUMIN / CREATININE URINE RATIO
CREATININE, URINE: 588 mg/dL — AB (ref 20–370)
MICROALB UR: 5 mg/dL
Microalb Creat Ratio: 9 mcg/mg creat (ref <30)

## 2016-08-19 LAB — LIPID PANEL
Cholesterol: 173 mg/dL (ref <200)
HDL: 60 mg/dL (ref >40)
LDL CALC: 90 mg/dL (ref <100)
Total CHOL/HDL Ratio: 2.9 Ratio (ref <5.0)
Triglycerides: 116 mg/dL (ref <150)
VLDL: 23 mg/dL (ref <30)

## 2016-08-19 LAB — RESPIRATORY VIRUS PANEL
ADENOVIRUS B: NOT DETECTED
Influenza A H1: NOT DETECTED
Influenza A H3: NOT DETECTED
Influenza A: NOT DETECTED
Influenza B: NOT DETECTED
METAPNEUMOVIRUS: NOT DETECTED
PARAINFLUENZA 2 A: NOT DETECTED
Parainfluenza 1: NOT DETECTED
Parainfluenza 3: NOT DETECTED
RESPIRATORY SYNCYTIAL VIRUS A: NOT DETECTED
RHINOVIRUS: NOT DETECTED
Respiratory Syncytial Virus B: NOT DETECTED

## 2016-08-19 LAB — BASIC METABOLIC PANEL WITH GFR
BUN: 17 mg/dL (ref 7–25)
CALCIUM: 8.8 mg/dL (ref 8.6–10.3)
CO2: 22 mmol/L (ref 20–31)
CREATININE: 0.82 mg/dL (ref 0.70–1.33)
Chloride: 100 mmol/L (ref 98–110)
GFR, Est African American: >89 mL/min (ref >=60)
GFR, Est Non African American: >89 mL/min (ref >=60)
Glucose, Bld: 109 mg/dL — ABNORMAL HIGH (ref 65–99)
Potassium: 3.6 mmol/L (ref 3.5–5.3)
SODIUM: 139 mmol/L (ref 135–146)

## 2016-08-19 MED FILL — SIMVASTATIN 40 MG TABLET: 40 | 30 days supply | Qty: 30 | Fill #0

## 2016-08-24 ENCOUNTER — Telehealth: Payer: Self-pay | Admitting: *Deleted

## 2016-08-24 NOTE — Telephone Encounter (Signed)
Patient verified DOB Patient is aware of kidney function being normal along with his other labs. Patient is also aware of Respiratory Virus Panel is negative. Patient is also aware of simvastatin being refilled and BP has been well controlled with this medication. Patient expressed his understanding and had no further questions at this time.

## 2016-08-24 NOTE — Telephone Encounter (Signed)
-----   Message from Alfonse Spruce, Percival sent at 08/23/2016 12:42 PM EST ----- -Microalbumin/creatinine ratio level was normal. This tests for protein in your urine that can indicate early signs of kidney damage.  -Respiratory virus panel was negative. -Labs normal. - Recommend quit smoking. -Lipid well controlled on zocor prescription has been refilled.

## 2016-09-29 MED FILL — ?PANTOPRAZOLE SOD DR 40MG: 40 MG | 30 days supply | Qty: 30 | Fill #1

## 2016-09-29 MED FILL — SIMVASTATIN 40 MG TABLET: 40 | 30 days supply | Qty: 30 | Fill #1

## 2016-09-29 MED FILL — LOSARTAN-HCTZ 100-25 MG TAB: 100-25 | 30 days supply | Qty: 30 | Fill #1

## 2016-10-11 ENCOUNTER — Ambulatory Visit: Payer: Self-pay | Attending: Family Medicine

## 2016-11-02 ENCOUNTER — Other Ambulatory Visit: Payer: Self-pay | Admitting: Family Medicine

## 2016-11-02 DIAGNOSIS — K219 Gastro-esophageal reflux disease without esophagitis: Secondary | ICD-10-CM

## 2016-11-02 MED ORDER — PANTOPRAZOLE SODIUM 40 MG PO TBEC: 40.0000 mg | DELAYED_RELEASE_TABLET | Freq: Every day | ORAL | 0 refills | Status: DC

## 2016-11-02 MED FILL — PANTOPRAZOLE SOD DR 40 MG T: 40 | 40 days supply | Qty: 40 | Fill #0

## 2016-11-02 MED FILL — LOSARTAN-HCTZ 100-25 MG TAB: 100-25 | 30 days supply | Qty: 30 | Fill #2

## 2016-11-02 MED FILL — SIMVASTATIN 40 MG TABLET: 40 | 30 days supply | Qty: 30 | Fill #2

## 2016-11-02 NOTE — Telephone Encounter (Signed)
Done and sent to [facility] pharmacy for refill.

## 2016-11-02 NOTE — Telephone Encounter (Signed)
CMA call tol et patient his RX is ready sent to the pharmacy & is ready to pick up  Patient did not answer but left a detailed message stating that is ready to pick up & if have any additional questions just to call back

## 2016-11-02 NOTE — Telephone Encounter (Signed)
Pt called reqesting a refill on his pantoprazole. No appts available until May, would like meds to last until appt is available. Please f/u. Thank you.

## 2016-12-06 ENCOUNTER — Telehealth: Payer: Self-pay | Admitting: Family Medicine

## 2016-12-06 DIAGNOSIS — K219 Gastro-esophageal reflux disease without esophagitis: Secondary | ICD-10-CM

## 2016-12-06 DIAGNOSIS — I10 Essential (primary) hypertension: Secondary | ICD-10-CM

## 2016-12-06 DIAGNOSIS — E785 Hyperlipidemia, unspecified: Secondary | ICD-10-CM

## 2016-12-06 NOTE — Telephone Encounter (Signed)
Patient called the office asking for one month supply for pantoprazole (PROTONIX) 40 MG tablet, simvastatin (ZOCOR) 40 MG tablet, and losartan-hydrochlorothiazide (HYZAAR) 100-25 MG tablet. Please send them to our pharmacy.   Thank you.

## 2016-12-07 MED ORDER — SIMVASTATIN 40 MG PO TABS: 40.0000 mg | ORAL_TABLET | Freq: Every day | ORAL | 0 refills | Status: DC

## 2016-12-07 MED ORDER — LOSARTAN POTASSIUM-HCTZ 100-25 MG PO TABS: 1.0000 | ORAL_TABLET | Freq: Every day | ORAL | 0 refills | Status: DC

## 2016-12-07 MED ORDER — PANTOPRAZOLE SODIUM 40 MG PO TBEC: 40.0000 mg | DELAYED_RELEASE_TABLET | Freq: Every day | ORAL | 0 refills | Status: DC

## 2016-12-07 MED FILL — LOSARTAN-HCTZ 100-25 MG TAB: 100-25 | 30 days supply | Qty: 30 | Fill #0

## 2016-12-07 MED FILL — SIMVASTATIN 40 MG TABLET: 40 | 30 days supply | Qty: 30 | Fill #0

## 2016-12-07 MED FILL — PANTOPRAZOLE SOD DR 40 MG T: 40 | 30 days supply | Qty: 30 | Fill #0

## 2016-12-07 NOTE — Telephone Encounter (Signed)
Requested medications refilled to last until next appt in June.

## 2016-12-23 ENCOUNTER — Ambulatory Visit: Payer: Self-pay | Attending: Family Medicine | Admitting: Family Medicine

## 2016-12-23 ENCOUNTER — Encounter: Payer: Self-pay | Admitting: Family Medicine

## 2016-12-23 VITALS — BP 117/72 | HR 115 | Temp 98.2°F | Resp 18 | Ht 67.0 in | Wt 202.4 lb

## 2016-12-23 DIAGNOSIS — R000 Tachycardia, unspecified: Secondary | ICD-10-CM

## 2016-12-23 DIAGNOSIS — R21 Rash and other nonspecific skin eruption: Secondary | ICD-10-CM | POA: Insufficient documentation

## 2016-12-23 DIAGNOSIS — B356 Tinea cruris: Secondary | ICD-10-CM

## 2016-12-23 DIAGNOSIS — H81313 Aural vertigo, bilateral: Secondary | ICD-10-CM

## 2016-12-23 DIAGNOSIS — Z87898 Personal history of other specified conditions: Secondary | ICD-10-CM

## 2016-12-23 DIAGNOSIS — H9209 Otalgia, unspecified ear: Secondary | ICD-10-CM | POA: Insufficient documentation

## 2016-12-23 DIAGNOSIS — M26622 Arthralgia of left temporomandibular joint: Secondary | ICD-10-CM

## 2016-12-23 DIAGNOSIS — E784 Other hyperlipidemia: Secondary | ICD-10-CM

## 2016-12-23 DIAGNOSIS — Z7982 Long term (current) use of aspirin: Secondary | ICD-10-CM | POA: Insufficient documentation

## 2016-12-23 DIAGNOSIS — K029 Dental caries, unspecified: Secondary | ICD-10-CM

## 2016-12-23 DIAGNOSIS — R42 Dizziness and giddiness: Secondary | ICD-10-CM | POA: Insufficient documentation

## 2016-12-23 DIAGNOSIS — I10 Essential (primary) hypertension: Secondary | ICD-10-CM

## 2016-12-23 DIAGNOSIS — R6884 Jaw pain: Secondary | ICD-10-CM | POA: Insufficient documentation

## 2016-12-23 LAB — POCT GLYCOSYLATED HEMOGLOBIN (HGB A1C): Hemoglobin A1C: 5.6

## 2016-12-23 LAB — GLUCOSE, POCT (MANUAL RESULT ENTRY): POC Glucose: 146 mg/dl — AB (ref 70–99)

## 2016-12-23 MED ORDER — MECLIZINE HCL 25 MG PO TABS: 25.0000 mg | ORAL_TABLET | Freq: Two times a day (BID) | ORAL | 0 refills | Status: DC | PRN

## 2016-12-23 MED ORDER — BENZOCAINE 10 % MT GEL: 1.0000 "application " | OROMUCOSAL | 0 refills | Status: DC | PRN

## 2016-12-23 MED ORDER — LOSARTAN POTASSIUM-HCTZ 100-25 MG PO TABS: 0.5000 | ORAL_TABLET | Freq: Every day | ORAL | 0 refills | Status: DC

## 2016-12-23 MED ORDER — KETOCONAZOLE 2 % EX CREA: 1.0000 "application " | TOPICAL_CREAM | Freq: Every day | CUTANEOUS | 1 refills | Status: DC

## 2016-12-23 MED ORDER — CARVEDILOL 3.125 MG PO TABS: 3.1250 mg | ORAL_TABLET | Freq: Every day | ORAL | 2 refills | Status: DC

## 2016-12-23 MED ORDER — IBUPROFEN 600 MG PO TABS: 600.0000 mg | ORAL_TABLET | Freq: Three times a day (TID) | ORAL | 0 refills | Status: DC | PRN

## 2016-12-23 MED FILL — ?CARVEDILOL 3.125 MG TABLET: 3.125 | 30 days supply | Qty: 30 | Fill #0

## 2016-12-23 MED FILL — IBUPROFEN 600 MG TABLET: 600 | 10 days supply | Qty: 30 | Fill #0

## 2016-12-23 MED FILL — KETOCONAZOLE 2% CREAM: 2 | 15 days supply | Qty: 15 | Fill #0

## 2016-12-23 MED FILL — TRAVEL SICKNESS 25 MG TAB C: 25 | 15 days supply | Qty: 30 | Fill #0

## 2016-12-23 NOTE — Patient Instructions (Signed)
Jock Itch Jock itch is an infection of the skin in the groin area. It is caused by a type of germ (fungus). The infection causes a rash and itching in the groin and upper thigh. It is common in people who play sports. Being in places with hot weather and wearing tight or wet clothes can increase the chance of getting jock itch. The rash usually goes away in 2-3 weeks with treatment. Follow these instructions at home:  Take medicines only as told by your doctor. Apply skin creams or ointments exactly as told.  Wear loose-fitting clothing. ? Men should wear cotton boxer shorts. ? Women should wear cotton underwear.  Change your underwear every day to keep your groin dry.  Avoid hot baths.  Dry your groin area well after you take a bath or shower. ? Use a separate towel to dry your groin area. This will help to prevent a spreading of the infection to other areas of your body.  Do not scratch the affected area.  Do not share towels with other people. Contact a doctor if:  Your rash does not improve or it gets worse after 2 weeks of treatment.  Your rash is spreading.  Your rash comes back after treatment is finished.  You have a fever.  You have redness, swelling, or pain in the area around your rash.  You have fluid, blood, or pus coming from your rash.  Your have your rash for more than 4 weeks. This information is not intended to replace advice given to you by your health care provider. Make sure you discuss any questions you have with your health care provider. Document Released: 09/29/2009 Document Revised: 12/11/2015 Document Reviewed: 04/16/2014 Elsevier Interactive Patient Education  2018 [employer].  Dizziness Dizziness is a common problem. It makes you feel unsteady or light-headed. You may feel like you are about to pass out (faint). Dizziness can lead to getting hurt if you stumble or fall. Dizziness can be caused by many things, including:  Medicines.  Not  having enough water in your body (dehydration).  Illness.  Follow these instructions at home: Eating and drinking  Drink enough fluid to keep your pee (urine) clear or pale yellow. This helps to keep you from getting dehydrated. Try to drink more clear fluids, such as water.  Do not drink alcohol.  Limit how much caffeine you drink or eat, if your doctor tells you to do that.  Limit how much salt (sodium) you drink or eat, if your doctor tells you to do that. Activity  Avoid making quick movements. ? When you stand up from sitting in a chair, steady yourself until you feel okay. ? In the morning, first sit up on the side of the bed. When you feel okay, stand slowly while you hold onto something. Do this until you know that your balance is fine.  If you need to stand in one place for a long time, move your legs often. Tighten and relax the muscles in your legs while you are standing.  Do not drive or use heavy machinery if you feel dizzy.  Avoid bending down if you feel dizzy. Place items in your home so you can reach them easily without leaning over. Lifestyle  Do not use any products that contain nicotine or tobacco, such as cigarettes and e-cigarettes. If you need help quitting, ask your doctor.  Try to lower your stress level. You can do this by using methods such as yoga or meditation.  Talk with your doctor if you need help. General instructions  Watch your dizziness for any changes.  Take over-the-counter and prescription medicines only as told by your doctor. Talk with your doctor if you think that you are dizzy because of a medicine that you are taking.  Tell a friend or a family member that you are feeling dizzy. If he or she notices any changes in your behavior, have this person call your doctor.  Keep all follow-up visits as told by your doctor. This is important. Contact a doctor if:  Your dizziness does not go away.  Your dizziness or light-headedness gets  worse.  You feel sick to your stomach (nauseous).  You have trouble hearing.  You have new symptoms.  You are unsteady on your feet.  You feel like the room is spinning. Get help right away if:  You throw up (vomit) or have watery poop (diarrhea), and you cannot eat or drink anything.  You have trouble: ? Talking. ? Walking. ? Swallowing. ? Using your arms, hands, or legs.  You feel generally weak.  You are not thinking clearly, or you have trouble forming sentences. A friend or family member may notice this.  You have: ? Chest pain. ? Pain in your belly (abdomen). ? Shortness of breath. ? Sweating.  Your vision changes.  You are bleeding.  You have a very bad headache.  You have neck pain or a stiff neck.  You have a fever. These symptoms may be an emergency. Do not wait to see if the symptoms will go away. Get medical help right away. Call your local emergency services (911 in the [place] Do not drive yourself to the hospital. Summary  Dizziness makes you feel unsteady or light-headed. You may feel like you are about to pass out (faint).  Drink enough fluid to keep your pee (urine) clear or pale yellow. Do not drink alcohol.  Avoid making quick movements if you feel dizzy.  Watch your dizziness for any changes. This information is not intended to replace advice given to you by your health care provider. Make sure you discuss any questions you have with your health care provider. Document Released: 06/24/2011 Document Revised: 07/22/2016 Document Reviewed: 07/22/2016 Elsevier Interactive Patient Education  2017 [employer].

## 2016-12-23 NOTE — Progress Notes (Signed)
Patient is here for left ear pain & rashes between his legs

## 2016-12-23 NOTE — Progress Notes (Signed)
Subjective:  Patient ID: Richard Calhoun, male    DOB: 06/23/1961  Age: 56 y.o. MRN: [id number]  CC: Otalgia   HPI Richard Calhoun presents for complaints of left sided anterior jaw and ear pain for 1 week and have persisted. Associated symptoms include dizziness, onset was Friday and lasted for two days.  He denies any fevers, trauma, ear drainage, sinus pressure, lightheadedness, or near syncope.       Rash: Patient complains of rash involving the genitalia and groin. Rash started 1 week ago. Appearance of rash at onset: Color of lesion(s): erythema, Texture of lesion(s): flat. Rash has not changed over time Initial distribution: genitalia and groin.  Discomfort associated with rash: is pruritic and irritation.  Associated symptoms: none.  Patient has not had recent evaluation of rash.  Patient has not had contacts with similar rash. Patient has not identified precipitant. Patient has not had new exposures (soaps, lotions, laundry detergents, foods, medications).                                                         Outpatient Medications Prior to Visit  Medication Sig Dispense Refill  . albuterol (PROVENTIL HFA;VENTOLIN HFA) 108 (90 Base) MCG/ACT inhaler Inhale 2 puffs into the lungs every 6 (six) hours as needed for wheezing or shortness of breath. 1 Inhaler 0  . aspirin 81 MG EC tablet Take 1 tablet (81 mg total) by mouth daily. Swallow whole. (Patient not taking: Reported on 08/28/2014) 30 tablet 12  . benzonatate (TESSALON) 100 MG capsule Take 1 capsule (100 mg total) by mouth 2 (two) times daily as needed for cough (Severe cough). 30 capsule 0  . guaiFENesin-dextromethorphan (ROBITUSSIN DM) 100-10 MG/5ML syrup Take 10 mLs by mouth every 4 (four) hours as needed for cough. 236 mL 0  . omega-3 acid ethyl esters (LOVAZA) 1 g capsule Take 2 capsules (2 g total) by mouth 2 (two) times daily. For cholesterol 120 capsule 3  . pantoprazole (PROTONIX) 40 MG tablet Take 1 tablet (40 mg total) by mouth  daily. 30 tablet 0  . simvastatin (ZOCOR) 40 MG tablet Take 1 tablet (40 mg total) by mouth at bedtime. 30 tablet 0  . losartan-hydrochlorothiazide (HYZAAR) 100-25 MG tablet Take 1 tablet by mouth daily. 30 tablet 0  . nystatin cream (MYCOSTATIN) Apply 1 application topically 2 (two) times daily. (Patient not taking: Reported on 08/18/2016) 30 g 0  . triamcinolone (KENALOG) 0.025 % cream Apply 1 application topically 2 (two) times daily. 30 g 0   No facility-administered medications prior to visit.     ROS Review of Systems  Constitutional: Negative.   HENT: Positive for ear pain.   Eyes: Negative.   Respiratory: Negative.   Cardiovascular: Negative.   Gastrointestinal: Negative.   Skin: Negative.   Neurological: Positive for dizziness.   Objective:  BP 117/72 (BP Location: Left Arm, Patient Position: Sitting, Cuff Size: Normal)   Pulse (!) 115   Temp 98.2 F (36.8 C) (Oral)   Resp 18   Ht 5\' 7"  (1.702 m)   Wt 202 lb 6.4 oz (91.8 kg)   SpO2 96%   BMI 31.70 kg/m   BP/Weight 12/23/2016 08/18/2016 0/93/2671  Systolic BP 245 809 983  Diastolic BP 72 70 82  Wt. (Lbs) 202.4 196.4 200.8  BMI 31.7 30.76 31.44    Physical Exam  Constitutional: He is oriented to person, place, and time. He appears well-developed and well-nourished.  HENT:  Head: Normocephalic and atraumatic.  Right Ear: External ear normal.  Left Ear: External ear normal.  Nose: Nose normal.  Mouth/Throat: Oropharynx is clear and moist.  Eyes: Conjunctivae are normal. Pupils are equal, round, and reactive to light.  Neck: Normal range of motion. Neck supple. No JVD present.  Cardiovascular: Normal rate, regular rhythm, normal heart sounds and intact distal pulses.   Pulmonary/Chest: Effort normal and breath sounds normal.  Abdominal: Soft. Bowel sounds are normal.  Lymphadenopathy:    He has no cervical adenopathy.  Neurological: He is alert and oriented to person, place, and time.  Skin: Skin is warm and  dry. Rash (bilateral groin; erythema present; pruritic) noted.  Psychiatric: He has a normal mood and affect.  Nursing note and vitals reviewed.  Assessment & Plan:   Problem List Items Addressed This Visit    None    Visit Diagnoses    Arthralgia of left temporomandibular joint    -  Primary   Relevant Medications   ibuprofen (ADVIL,MOTRIN) 600 MG tablet   Other Relevant Orders   Ambulatory referral to ENT   Vertigo, aural, bilateral       Relevant Medications   meclizine (ANTIVERT) 25 MG tablet   Other Relevant Orders   Ambulatory referral to ENT   Tinea cruris       Relevant Medications   ketoconazole (NIZORAL) 2 % cream   History of impaired glucose tolerance       Relevant Orders   Glucose (CBG) (Completed)   HgB A1c (Completed)   Dental caries       Relevant Medications   ketoconazole (NIZORAL) 2 % cream   benzocaine (ORAJEL) 10 % mucosal gel   Other Relevant Orders   Ambulatory referral to Dentistry   Tachycardia       Relevant Medications   carvedilol (COREG) 3.125 MG tablet   Essential hypertension       HR found to be elevated in office even after repeat check.   He is asymptomatic.    Coreg ordered for better HR control.   Follow up with PCP in 2 weeks.   Relevant Medications   carvedilol (COREG) 3.125 MG tablet   losartan-hydrochlorothiazide (HYZAAR) 100-25 MG tablet      Meds ordered this encounter  Medications  . ketoconazole (NIZORAL) 2 % cream    Sig: Apply 1 application topically daily. Apply to affected areas.    Dispense:  15 g    Refill:  1    Order Specific Question:   Supervising Provider    Answer:   Tresa Garter [id number]  . ibuprofen (ADVIL,MOTRIN) 600 MG tablet    Sig: Take 1 tablet (600 mg total) by mouth every 8 (eight) hours as needed for moderate pain (Take with food).    Dispense:  30 tablet    Refill:  0    Order Specific Question:   Supervising Provider    Answer:   Tresa Garter [id number]  . meclizine  (ANTIVERT) 25 MG tablet    Sig: Take 1 tablet (25 mg total) by mouth 2 (two) times daily as needed for dizziness.    Dispense:  30 tablet    Refill:  0    Order Specific Question:   Supervising Provider    Answer:   Tresa Garter [id number]  .  benzocaine (ORAJEL) 10 % mucosal gel    Sig: Use as directed 1 application in the mouth or throat as needed for mouth pain.    Dispense:  5.3 g    Refill:  0    Order Specific Question:   Supervising Provider    Answer:   Tresa Garter [id number]  . carvedilol (COREG) 3.125 MG tablet    Sig: Take 1 tablet (3.125 mg total) by mouth daily.    Dispense:  30 tablet    Refill:  2    Order Specific Question:   Supervising Provider    Answer:   Tresa Garter [id number]  . losartan-hydrochlorothiazide (HYZAAR) 100-25 MG tablet    Sig: Take 0.5 tablets by mouth daily.    Dispense:  30 tablet    Refill:  0    Order Specific Question:   Supervising Provider    Answer:   Tresa Garter [id number]    Follow-up: Return in about 2 weeks (around 01/06/2017), or if symptoms worsen or fail to improve, for Elevated HR.   Alfonse Spruce FNP

## 2016-12-30 ENCOUNTER — Other Ambulatory Visit: Payer: Self-pay | Admitting: Family Medicine

## 2016-12-30 ENCOUNTER — Other Ambulatory Visit: Payer: Self-pay | Admitting: Pharmacist

## 2016-12-30 ENCOUNTER — Telehealth: Payer: Self-pay | Admitting: Family Medicine

## 2016-12-30 DIAGNOSIS — B356 Tinea cruris: Secondary | ICD-10-CM

## 2016-12-30 MED ORDER — TERBINAFINE HCL 250 MG PO TABS: 250.0000 mg | ORAL_TABLET | Freq: Every day | ORAL | 0 refills | Status: DC

## 2016-12-30 MED FILL — TERBINAFINE HCL 250 MG TAB: 250 | 7 days supply | Qty: 14 | Fill #0

## 2016-12-30 NOTE — Telephone Encounter (Signed)
Patient requested terbinafine be sent to [facility] pharmacy - medication sent here.

## 2016-12-30 NOTE — Telephone Encounter (Signed)
CMA call regarding medication refill is ready  sent to pharmacy   Patient did not answer but left a detailed message & if have any questions just call back

## 2016-12-30 NOTE — Telephone Encounter (Signed)
Prescription sent for terbinafine to the pharmacy on file. If still no symptom improvement after course of therapy is complete, recommend scheduling follow up visit.

## 2016-12-30 NOTE — Telephone Encounter (Signed)
Pt called stating that the cream that he was prescribed is not working and would like to know if there is anything else that may be prescribed to help. Please f/u with pt.

## 2017-01-05 ENCOUNTER — Encounter: Payer: Self-pay | Admitting: Family Medicine

## 2017-01-11 ENCOUNTER — Telehealth: Payer: Self-pay | Admitting: Family Medicine

## 2017-01-11 NOTE — Telephone Encounter (Signed)
terbinafine (LAMISIL) 250 MG tablet   ketoconazole (NIZORAL) 2 % cream

## 2017-01-16 HISTORY — PX: TRANSTHORACIC ECHOCARDIOGRAM: SHX275

## 2017-01-17 ENCOUNTER — Other Ambulatory Visit: Payer: Self-pay

## 2017-01-17 ENCOUNTER — Ambulatory Visit: Payer: Self-pay | Attending: Family Medicine | Admitting: Family Medicine

## 2017-01-17 VITALS — BP 110/74 | HR 130 | Temp 98.3°F | Resp 18 | Ht 67.0 in | Wt 201.6 lb

## 2017-01-17 DIAGNOSIS — L304 Erythema intertrigo: Secondary | ICD-10-CM

## 2017-01-17 DIAGNOSIS — R000 Tachycardia, unspecified: Secondary | ICD-10-CM

## 2017-01-17 DIAGNOSIS — Z79899 Other long term (current) drug therapy: Secondary | ICD-10-CM | POA: Insufficient documentation

## 2017-01-17 DIAGNOSIS — R55 Syncope and collapse: Secondary | ICD-10-CM | POA: Insufficient documentation

## 2017-01-17 DIAGNOSIS — K029 Dental caries, unspecified: Secondary | ICD-10-CM

## 2017-01-17 DIAGNOSIS — Z87898 Personal history of other specified conditions: Secondary | ICD-10-CM

## 2017-01-17 DIAGNOSIS — Z7982 Long term (current) use of aspirin: Secondary | ICD-10-CM | POA: Insufficient documentation

## 2017-01-17 DIAGNOSIS — Z760 Encounter for issue of repeat prescription: Secondary | ICD-10-CM

## 2017-01-17 DIAGNOSIS — R21 Rash and other nonspecific skin eruption: Secondary | ICD-10-CM | POA: Insufficient documentation

## 2017-01-17 MED ORDER — PANTOPRAZOLE SODIUM 40 MG PO TBEC: 40.0000 mg | DELAYED_RELEASE_TABLET | Freq: Every day | ORAL | 0 refills | Status: DC

## 2017-01-17 MED ORDER — FLUCONAZOLE 150 MG PO TABS: 150.0000 mg | ORAL_TABLET | Freq: Once | ORAL | 0 refills | Status: AC

## 2017-01-17 MED ORDER — TRIAMCINOLONE ACETONIDE 0.025 % EX OINT: 1.0000 "application " | TOPICAL_OINTMENT | Freq: Two times a day (BID) | CUTANEOUS | 0 refills | Status: DC

## 2017-01-17 MED ORDER — BENZOCAINE 10 % MT GEL: 1.0000 "application " | OROMUCOSAL | 0 refills | Status: DC | PRN

## 2017-01-17 MED ORDER — ACETAMINOPHEN 500 MG PO TABS: 1000.0000 mg | ORAL_TABLET | Freq: Four times a day (QID) | ORAL | 0 refills | Status: DC | PRN

## 2017-01-17 MED ORDER — METOPROLOL TARTRATE 25 MG PO TABS: 12.5000 mg | ORAL_TABLET | Freq: Two times a day (BID) | ORAL | 1 refills | Status: DC

## 2017-01-17 MED ORDER — SIMVASTATIN 40 MG PO TABS: 40.0000 mg | ORAL_TABLET | Freq: Every day | ORAL | 0 refills | Status: DC

## 2017-01-17 MED ORDER — KETOCONAZOLE 2 % EX CREA: 1.0000 "application " | TOPICAL_CREAM | Freq: Every day | CUTANEOUS | 1 refills | Status: DC

## 2017-01-17 MED ORDER — LOSARTAN POTASSIUM-HCTZ 50-12.5 MG PO TABS: 1.0000 | ORAL_TABLET | Freq: Every day | ORAL | 0 refills | Status: DC

## 2017-01-17 MED FILL — KETOCONAZOLE 2% CREAM: 2 | 15 days supply | Qty: 15 | Fill #0

## 2017-01-17 MED FILL — TRIAMCINOLONE 0.025% OINT: 0.025 | 15 days supply | Qty: 30 | Fill #0

## 2017-01-17 MED FILL — FLUCONAZOLE 150 MG TABLET: 150 | 1 days supply | Qty: 1 | Fill #0

## 2017-01-17 NOTE — Progress Notes (Signed)
Subjective:  Patient ID: Richard Calhoun, male    DOB: 08/07/1960  Age: 56 y.o. MRN: [id number]  CC: Follow-up   HPI Richard Calhoun presents for complains of rash involving the genitalia and groin. Rash started several  weeks ago. Appearance of rash at onset: Color of lesion(s): erythema, Texture of lesion(s): flat. Rash has not changed over time Initial distribution: genitalia and groin.  Discomfort associated with rash: is pruritic and irritation. Reports symptoms are not relieved with current medications prescribed. Patient complains of syncope, which occurred 1 months ago.Patient describes the episode as syncopal episode occurred with moving a microwave in his kitchen. Patient also has associated symptoms of  general feeling of lightheadedness. The patient denies headache, nausea and visual aura. Elevated HR in office despite beta blocker use. Patient denies chest pain, chest pressure/discomfort, claudication, dyspnea, lower extremity edema, orthopnea and palpitations.     Outpatient Medications Prior to Visit  Medication Sig Dispense Refill  . albuterol (PROVENTIL HFA;VENTOLIN HFA) 108 (90 Base) MCG/ACT inhaler Inhale 2 puffs into the lungs every 6 (six) hours as needed for wheezing or shortness of breath. 1 Inhaler 0  . aspirin 81 MG EC tablet Take 1 tablet (81 mg total) by mouth daily. Swallow whole. (Patient not taking: Reported on 08/28/2014) 30 tablet 12  . benzonatate (TESSALON) 100 MG capsule Take 1 capsule (100 mg total) by mouth 2 (two) times daily as needed for cough (Severe cough). 30 capsule 0  . guaiFENesin-dextromethorphan (ROBITUSSIN DM) 100-10 MG/5ML syrup Take 10 mLs by mouth every 4 (four) hours as needed for cough. 236 mL 0  . ibuprofen (ADVIL,MOTRIN) 600 MG tablet Take 1 tablet (600 mg total) by mouth every 8 (eight) hours as needed for moderate pain (Take with food). 30 tablet 0  . meclizine (ANTIVERT) 25 MG tablet Take 1 tablet (25 mg total) by mouth 2 (two) times daily as  needed for dizziness. 30 tablet 0  . omega-3 acid ethyl esters (LOVAZA) 1 g capsule Take 2 capsules (2 g total) by mouth 2 (two) times daily. For cholesterol 120 capsule 3  . terbinafine (LAMISIL) 250 MG tablet Take 1 tablet (250 mg total) by mouth daily. 14 tablet 0  . benzocaine (ORAJEL) 10 % mucosal gel Use as directed 1 application in the mouth or throat as needed for mouth pain. 5.3 g 0  . carvedilol (COREG) 3.125 MG tablet Take 1 tablet (3.125 mg total) by mouth daily. 30 tablet 2  . ketoconazole (NIZORAL) 2 % cream Apply 1 application topically daily. Apply to affected areas. 15 g 1  . losartan-hydrochlorothiazide (HYZAAR) 100-25 MG tablet Take 0.5 tablets by mouth daily. 30 tablet 0  . pantoprazole (PROTONIX) 40 MG tablet Take 1 tablet (40 mg total) by mouth daily. 30 tablet 0  . simvastatin (ZOCOR) 40 MG tablet Take 1 tablet (40 mg total) by mouth at bedtime. 30 tablet 0   No facility-administered medications prior to visit.     ROS Review of Systems  Constitutional: Negative.   HENT: Positive for dental problem.   Respiratory: Negative.   Cardiovascular: Negative.   Gastrointestinal: Negative.   Skin: Positive for rash.  Neurological: Positive for syncope.   Objective:  BP 110/74 (BP Location: Left Arm, Patient Position: Sitting, Cuff Size: Normal)   Pulse (!) 130 Comment: EKG  Temp 98.3 F (36.8 C) (Oral)   Resp 18   Ht _0  (1.702 m)   Wt 201 lb 9.6 oz (91.4 kg)   SpO2  96%   BMI 31.58 kg/m   BP/Weight 01/17/2017 12/23/2016 1/66/0630  Systolic BP 160 109 323  Diastolic BP 74 72 70  Wt. (Lbs) 201.6 202.4 196.4  BMI 31.58 31.7 30.76    Physical Exam  Constitutional: He is oriented to person, place, and time. He appears well-developed and well-nourished.  HENT:  Head: Normocephalic and atraumatic.  Right Ear: External ear normal.  Left Ear: External ear normal.  Nose: Nose normal.  Mouth/Throat: Oropharynx is clear and moist. Dental caries present.  Eyes:  Conjunctivae are normal. Pupils are equal, round, and reactive to light.  Neck: No JVD present.  Cardiovascular: Normal rate, regular rhythm, normal heart sounds and intact distal pulses.   Pulmonary/Chest: Effort normal and breath sounds normal.  Abdominal: Soft. Bowel sounds are normal.  Musculoskeletal: Normal range of motion.  Neurological: He is alert and oriented to person, place, and time.  Skin: Skin is warm and dry. Rash (bilateral groin; erythema present; pruritic) noted.  Nursing note and vitals reviewed.  Assessment & Plan:   Problem List Items Addressed This Visit    None    Visit Diagnoses    Intertrigo    -  Primary   Relevant Medications   ketoconazole (NIZORAL) 2 % cream   triamcinolone (KENALOG) 0.025 % ointment   Other Relevant Orders   Ambulatory referral to Dermatology   History of syncope       Relevant Orders   CMP14+EGFR   CBC with Differential   EKG 12-Lead   ECHOCARDIOGRAM COMPLETE   Ambulatory referral to Cardiology   Tachycardia       Relevant Orders   CMP14+EGFR   CBC with Differential   EKG 12-Lead   ECHOCARDIOGRAM COMPLETE   Ambulatory referral to Cardiology   TSH   Medication refill       Relevant Medications   simvastatin (ZOCOR) 40 MG tablet   pantoprazole (PROTONIX) 40 MG tablet   losartan-hydrochlorothiazide (HYZAAR) 50-12.5 MG tablet   metoprolol tartrate (LOPRESSOR) 25 MG tablet   Dental caries       Relevant Medications   ketoconazole (NIZORAL) 2 % cream   benzocaine (ORAJEL) 10 % mucosal gel   acetaminophen (TYLENOL) 500 MG tablet   Other Relevant Orders   Ambulatory referral to Dentistry      Meds ordered this encounter  Medications  . ketoconazole (NIZORAL) 2 % cream    Sig: Apply 1 application topically daily. Apply to affected areas.    Dispense:  15 g    Refill:  1    Order Specific Question:   Supervising Provider    Answer:   Tresa Garter [id number]  . fluconazole (DIFLUCAN) 150 MG tablet    Sig: Take  1 tablet (150 mg total) by mouth once.    Dispense:  1 tablet    Refill:  0    Order Specific Question:   Supervising Provider    Answer:   Tresa Garter [id number]  . triamcinolone (KENALOG) 0.025 % ointment    Sig: Apply 1 application topically 2 (two) times daily. Apply to affected areas.    Dispense:  30 g    Refill:  0    Order Specific Question:   Supervising Provider    Answer:   Tresa Garter [id number]  . simvastatin (ZOCOR) 40 MG tablet    Sig: Take 1 tablet (40 mg total) by mouth at bedtime.    Dispense:  90 tablet    Refill:  0  Order Specific Question:   Supervising Provider    Answer:   Tresa Garter [id number]  . pantoprazole (PROTONIX) 40 MG tablet    Sig: Take 1 tablet (40 mg total) by mouth daily.    Dispense:  90 tablet    Refill:  0    Order Specific Question:   Supervising Provider    Answer:   Tresa Garter [id number]  . losartan-hydrochlorothiazide (HYZAAR) 50-12.5 MG tablet    Sig: Take 1 tablet by mouth daily.    Dispense:  90 tablet    Refill:  0    Order Specific Question:   Supervising Provider    Answer:   Tresa Garter [id number]  . metoprolol tartrate (LOPRESSOR) 25 MG tablet    Sig: Take 0.5 tablets (12.5 mg total) by mouth 2 (two) times daily.    Dispense:  30 tablet    Refill:  1    Order Specific Question:   Supervising Provider    Answer:   Tresa Garter [id number]  . benzocaine (ORAJEL) 10 % mucosal gel    Sig: Use as directed 1 application in the mouth or throat as needed for mouth pain.    Dispense:  5.3 g    Refill:  0    Order Specific Question:   Supervising Provider    Answer:   Tresa Garter [id number]  . acetaminophen (TYLENOL) 500 MG tablet    Sig: Take 2 tablets (1,000 mg total) by mouth every 6 (six) hours as needed for moderate pain.    Dispense:  30 tablet    Refill:  0    Order Specific Question:   Supervising Provider    Answer:   Tresa Garter [id number]      Follow-up: Return in about 2 months (around 03/20/2017) for HTN.   Alfonse Spruce FNP

## 2017-01-17 NOTE — Progress Notes (Signed)
Patient is here for rash   Patient needs dental referral

## 2017-01-18 MED FILL — ?PANTOPRAZOLE SOD DR 40MG: 40 MG | 30 days supply | Qty: 30 | Fill #0

## 2017-01-18 MED FILL — SIMVASTATIN 40 MG TABLET: 40 | 30 days supply | Qty: 30 | Fill #0

## 2017-01-18 MED FILL — LOSARTAN-HCTZ 50-12.5 MG TA: 50-12.5 | 30 days supply | Qty: 30 | Fill #0

## 2017-01-18 MED FILL — ?METOPROLOL 25 MG TABLET: 25 | 30 days supply | Qty: 30 | Fill #0

## 2017-01-20 NOTE — Telephone Encounter (Signed)
CMA call regarding echo appt   Patient did not answer but left a detailed message with the info of his echo appt @ [facility] on 02/01/17 @ 3:00 pm

## 2017-02-01 ENCOUNTER — Ambulatory Visit (HOSPITAL_COMMUNITY)
Admission: RE | Admit: 2017-02-01 | Discharge: 2017-02-01 | Disposition: A | Discharge status: Home / Self Care | Payer: Self-pay | Source: Ambulatory Visit | Attending: Family Medicine | Admitting: Family Medicine

## 2017-02-01 DIAGNOSIS — Z87898 Personal history of other specified conditions: Secondary | ICD-10-CM

## 2017-02-01 DIAGNOSIS — I080 Rheumatic disorders of both mitral and aortic valves: Secondary | ICD-10-CM | POA: Insufficient documentation

## 2017-02-01 DIAGNOSIS — I5030 Unspecified diastolic (congestive) heart failure: Secondary | ICD-10-CM | POA: Insufficient documentation

## 2017-02-01 DIAGNOSIS — R000 Tachycardia, unspecified: Secondary | ICD-10-CM

## 2017-02-01 DIAGNOSIS — I0989 Other specified rheumatic heart diseases: Secondary | ICD-10-CM | POA: Insufficient documentation

## 2017-02-01 NOTE — Progress Notes (Signed)
  Echocardiogram 2D Echocardiogram has been performed.  Rosbel Buckner T Derral Colucci 02/01/2017, 3:44 PM

## 2017-02-07 ENCOUNTER — Telehealth: Payer: Self-pay | Admitting: Family Medicine

## 2017-02-07 NOTE — Telephone Encounter (Signed)
Pt called to request the CT result , please give him a call, please follow up

## 2017-02-08 NOTE — Telephone Encounter (Signed)
Pt returning your call, please call him back in the morning

## 2017-02-08 NOTE — Telephone Encounter (Signed)
Pt called to request the CT result , please give him a call, please follow up

## 2017-02-08 NOTE — Telephone Encounter (Signed)
CMA call regarding echo results  Patient did not answer but left a VM stating the reason of the call & to call back

## 2017-02-08 NOTE — Telephone Encounter (Signed)
-----   Message from Alfonse Spruce, [place] sent at 02/08/2017  1:36 PM EDT ----- -Echocardiogram which looks at your heart structure and function showed normal pumping ability of the heart. Left side of heart wall is thickened. This can occur with uncontrolled hypertension overtime. Continue to take your medication for blood pressure. No fluid around the heart, no narrowing of the arteries of the heart.

## 2017-02-09 NOTE — Telephone Encounter (Signed)
CMA call regarding echo results   Patient verify DOB   Patient was aware and understood    

## 2017-02-25 MED FILL — LOSARTAN-HCTZ 50-12.5 MG TA: 50-12.5 | 30 days supply | Qty: 30 | Fill #1

## 2017-02-25 MED FILL — ?PANTOPRAZOLE SOD DR 40MG: 40 MG | 30 days supply | Qty: 30 | Fill #1

## 2017-02-25 MED FILL — METOPROLOL TARTRATE 25 MG T: 25 | 30 days supply | Qty: 30 | Fill #1

## 2017-02-25 MED FILL — SIMVASTATIN 40 MG TABLET: 40 | 30 days supply | Qty: 30 | Fill #1

## 2017-03-07 ENCOUNTER — Ambulatory Visit: Payer: Self-pay | Admitting: Cardiovascular Disease

## 2017-03-15 ENCOUNTER — Ambulatory Visit: Payer: Self-pay | Admitting: Internal Medicine

## 2017-03-19 DIAGNOSIS — Z9861 Coronary angioplasty status: Secondary | ICD-10-CM

## 2017-03-19 DIAGNOSIS — I2510 Atherosclerotic heart disease of native coronary artery without angina pectoris: Secondary | ICD-10-CM

## 2017-03-19 HISTORY — DX: Atherosclerotic heart disease of native coronary artery without angina pectoris: I25.10

## 2017-03-19 HISTORY — DX: Coronary angioplasty status: Z98.61

## 2017-03-25 ENCOUNTER — Ambulatory Visit: Payer: Self-pay | Attending: Family Medicine | Admitting: Family Medicine

## 2017-03-25 ENCOUNTER — Encounter: Payer: Self-pay | Admitting: Internal Medicine

## 2017-03-25 ENCOUNTER — Encounter: Payer: Self-pay | Admitting: Family Medicine

## 2017-03-25 ENCOUNTER — Ambulatory Visit (INDEPENDENT_AMBULATORY_CARE_PROVIDER_SITE_OTHER): Payer: PRIVATE HEALTH INSURANCE | Admitting: Internal Medicine

## 2017-03-25 VITALS — BP 138/78 | HR 85 | Temp 98.8°F | Resp 18 | Ht 67.0 in | Wt 205.2 lb

## 2017-03-25 VITALS — BP 138/70 | HR 85 | Ht 67.0 in | Wt 205.0 lb

## 2017-03-25 DIAGNOSIS — R0609 Other forms of dyspnea: Secondary | ICD-10-CM | POA: Insufficient documentation

## 2017-03-25 DIAGNOSIS — I10 Essential (primary) hypertension: Secondary | ICD-10-CM | POA: Insufficient documentation

## 2017-03-25 DIAGNOSIS — Z760 Encounter for issue of repeat prescription: Secondary | ICD-10-CM | POA: Insufficient documentation

## 2017-03-25 DIAGNOSIS — E784 Other hyperlipidemia: Secondary | ICD-10-CM

## 2017-03-25 DIAGNOSIS — Z79899 Other long term (current) drug therapy: Secondary | ICD-10-CM | POA: Insufficient documentation

## 2017-03-25 DIAGNOSIS — K439 Ventral hernia without obstruction or gangrene: Secondary | ICD-10-CM | POA: Insufficient documentation

## 2017-03-25 DIAGNOSIS — F1029 Alcohol dependence with unspecified alcohol-induced disorder: Secondary | ICD-10-CM

## 2017-03-25 DIAGNOSIS — F17200 Nicotine dependence, unspecified, uncomplicated: Secondary | ICD-10-CM | POA: Insufficient documentation

## 2017-03-25 DIAGNOSIS — R000 Tachycardia, unspecified: Secondary | ICD-10-CM

## 2017-03-25 DIAGNOSIS — I119 Hypertensive heart disease without heart failure: Secondary | ICD-10-CM

## 2017-03-25 DIAGNOSIS — E7849 Other hyperlipidemia: Secondary | ICD-10-CM

## 2017-03-25 MED ORDER — LOSARTAN POTASSIUM 50 MG PO TABS: 50.0000 mg | ORAL_TABLET | Freq: Every day | ORAL | 2 refills | Status: DC

## 2017-03-25 MED ORDER — PANTOPRAZOLE SODIUM 40 MG PO TBEC: 40.0000 mg | DELAYED_RELEASE_TABLET | Freq: Every day | ORAL | 3 refills | Status: DC

## 2017-03-25 MED ORDER — SIMVASTATIN 40 MG PO TABS: 40.0000 mg | ORAL_TABLET | Freq: Every day | ORAL | 2 refills | Status: DC

## 2017-03-25 MED ORDER — METOPROLOL TARTRATE 25 MG PO TABS: 25.0000 mg | ORAL_TABLET | Freq: Two times a day (BID) | ORAL | 2 refills | Status: DC

## 2017-03-25 MED FILL — SIMVASTATIN 40 MG TABLET: 40 | 30 days supply | Qty: 30 | Fill #0

## 2017-03-25 MED FILL — ?METOPROLOL 25 MG TABLET: 25 | 30 days supply | Qty: 60 | Fill #0

## 2017-03-25 MED FILL — ?PANTOPRAZOLE SOD DR 40MG: 40 MG | 30 days supply | Qty: 30 | Fill #0

## 2017-03-25 MED FILL — LOSARTAN POTASSIUM 50 MG TA: 50 | 30 days supply | Qty: 30 | Fill #0

## 2017-03-25 NOTE — Progress Notes (Signed)
Patient is here for f/up HTN   Patient has eaten for today   Patient stated that he has taking 1/2 of the lopressor med

## 2017-03-25 NOTE — Progress Notes (Signed)
OFFICE CONSULT NOTE  Chief Complaint:  Tachycardia, syncope  Primary Care Physician: Alfonse Spruce, FNP  HPI:  Richard Calhoun is a 56 y.o. male who is being seen today for the evaluation of tachycardia at the request of Richard Calhoun, F*. [name] presents today for evaluation of tachycardia and syncope. He has a past medical history significant for chronic pain, dyslipidemia, hypertension and an 80-pack-year smoking history, currently he is quit but uses baby. He also has significant alcohol dependence drinking at least a fifth of rum daily. He has not worked in some time. Recently he's been having some tachycardia and passed out. He felt his heart racing at times which would happen spontaneously and he would become more short of breath and then dizzy. He said the dizziness progressed the point where he was either presyncopal and one time actually did pass out. He had a recent echo which showed normal systolic function and moderate LVH with diastolic dysfunction. He apparently was started on metoprolol tartrate 12.5 mg twice a day. He said since starting this medicine his heart rate is much lower and he feels almost no palpitations anymore. In general he feels much better. Blood pressure is also better controlled today 138/70. Heart rate is 85. He denies any chest pain. There is no significant history of heart disease in the family although his mother did die with a stroke at advanced age. He does have a sister who had kidney transplant who lives in [place].   PMHx:  Past Medical History:  Diagnosis Date  . ALLERGIC RHINITIS   . ANXIETY   . BICEPS TENDON RUPTURE, RIGHT   . DEPENDENCE, ALCOHOL NEC/NOS, UNSPECIFIED   . DEPRESSION   . GERD   . HYPERLIPIDEMIA   . HYPERTENSION   . Impaired glucose tolerance   . OSTEOARTHRITIS   . Tubular adenoma of colon 06/2006    Past Surgical History:  Procedure Laterality Date  . FOOT SURGERY    . MANDIBLE FRACTURE SURGERY      FAMHx:    Family History  Problem Relation Age of Onset  . Kidney disease Sister   . Stroke Mother   . Stroke Father     SOCHx:   reports that he has been smoking.  He has never used smokeless tobacco. He reports that he drinks alcohol. He reports that he does not use drugs.  ALLERGIES:  Allergies  Allergen Reactions  . Penicillins     ROS: Pertinent items noted in HPI and remainder of comprehensive ROS otherwise negative.  HOME MEDS: Current Outpatient Prescriptions on File Prior to Visit  Medication Sig Dispense Refill  . ibuprofen (ADVIL,MOTRIN) 600 MG tablet Take 1 tablet (600 mg total) by mouth every 8 (eight) hours as needed for moderate pain (Take with food). 30 tablet 0  . ketoconazole (NIZORAL) 2 % cream Apply 1 application topically daily. Apply to affected areas. 15 g 1  . losartan-hydrochlorothiazide (HYZAAR) 50-12.5 MG tablet Take 1 tablet by mouth daily. 90 tablet 0  . metoprolol tartrate (LOPRESSOR) 25 MG tablet Take 0.5 tablets (12.5 mg total) by mouth 2 (two) times daily. 30 tablet 1  . pantoprazole (PROTONIX) 40 MG tablet Take 1 tablet (40 mg total) by mouth daily. 90 tablet 0  . simvastatin (ZOCOR) 40 MG tablet Take 1 tablet (40 mg total) by mouth at bedtime. 90 tablet 0  . triamcinolone (KENALOG) 0.025 % ointment Apply 1 application topically 2 (two) times daily. Apply to affected areas. 30 g  0   No current facility-administered medications on file prior to visit.     LABS/IMAGING: No results found for this or any previous visit (from the past 48 hour(s)). No results found.  LIPID PANEL:    Component Value Date/Time   CHOL 173 08/18/2016 1625   TRIG 116 08/18/2016 1625   TRIG 70 07/18/2006 1301   HDL 60 08/18/2016 1625   CHOLHDL 2.9 08/18/2016 1625   VLDL 23 08/18/2016 1625   LDLCALC 90 08/18/2016 1625   LDLDIRECT 165 (H) 08/16/2013 1732    WEIGHTS: Wt Readings from Last 3 Encounters:  03/25/17 205 lb (93 kg)  01/17/17 201 lb 9.6 oz (91.4 kg)   12/23/16 202 lb 6.4 oz (91.8 kg)    VITALS: BP 138/70   Pulse 85   Ht 5\' 7"  (1.702 m)   Wt 205 lb (93 kg)   BMI 32.11 kg/m   EXAM: General appearance: alert and no distress Neck: no carotid bruit, no JVD and thyroid not enlarged, symmetric, no tenderness/mass/nodules Lungs: clear to auscultation bilaterally Heart: regular rate and rhythm Abdomen: soft, non-tender; bowel sounds normal; no masses,  no organomegaly Extremities: extremities normal, atraumatic, no cyanosis or edema Pulses: 2+ and symmetric Skin: Skin color, texture, turgor normal. No rashes or lesions Neurologic: Grossly normal Psych: Pleasant  EKG: Normal sinus rhythm at 85 - personally reviewed  ASSESSMENT: 1. Paroxysmal tachycardia 2. Hypertensive heart disease 3. Hypertension 4. Dyslipidemia 5. Alcohol dependence 6. Former smoker  PLAN: 1.   Richard Calhoun has described paroxysmal tachycardia. It's possible this could be a rhythm like A. fib. He does say that his pre-much resolved and there is no evidence of A. fib on his EKG today. I did advise he continue on his beta blocker. This will help both his blood pressure and hypertensive heart disease. He is also on an ARB/diuretic. I would be careful about the diuretic given his heavy alcohol use. In addition I be very concerned about using low-dose aspirin as a preventative measure because of his alcohol use. Counseled him on reducing his alcohol use. He is a former smoker reports a wheezing and shortness of breath and may have an element of chronic bronchitis or COPD. Consider formal pulmonary function testing if he has more complaints of shortness of breath. Otherwise some heavy see him back on an as-needed basis.  Thanks for the consultation.  Pixie Casino, MD, [place]  Attending Cardiologist  Direct Dial: [phone]  Fax: [phone]  Website:  [email or web address] Tarl Cephas 03/25/2017, 1:18 PM

## 2017-03-25 NOTE — Progress Notes (Deleted)
   Subjective:  Patient ID: Richard Calhoun, male    DOB: Jun 11, 1961  Age: 56 y.o. MRN: [id number]  CC: No chief complaint on file.   HPI DARRIEN BELTER presents for ***   D/c diuretic PFT  SOB   Referral   Not     Outpatient Medications Prior to Visit  Medication Sig Dispense Refill  . ibuprofen (ADVIL,MOTRIN) 600 MG tablet Take 1 tablet (600 mg total) by mouth every 8 (eight) hours as needed for moderate pain (Take with food). 30 tablet 0  . ketoconazole (NIZORAL) 2 % cream Apply 1 application topically daily. Apply to affected areas. 15 g 1  . losartan-hydrochlorothiazide (HYZAAR) 50-12.5 MG tablet Take 1 tablet by mouth daily. 90 tablet 0  . metoprolol tartrate (LOPRESSOR) 25 MG tablet Take 0.5 tablets (12.5 mg total) by mouth 2 (two) times daily. 30 tablet 1  . pantoprazole (PROTONIX) 40 MG tablet Take 1 tablet (40 mg total) by mouth daily. 90 tablet 0  . simvastatin (ZOCOR) 40 MG tablet Take 1 tablet (40 mg total) by mouth at bedtime. 90 tablet 0  . triamcinolone (KENALOG) 0.025 % ointment Apply 1 application topically 2 (two) times daily. Apply to affected areas. 30 g 0   No facility-administered medications prior to visit.     ROS Review of Systems      Objective:  BP 138/78 (BP Location: Left Arm, Patient Position: Sitting, Cuff Size: Normal)   Pulse 85   Temp 98.8 F (37.1 C) (Oral)   Resp 18   Ht 5\' 7"  (1.702 m)   Wt 205 lb 3.2 oz (93.1 kg)   SpO2 96%   BMI 32.14 kg/m   BP/Weight 03/25/2017 0/03/3234 11/23/3218  Systolic BP 254 270 623  Diastolic BP 70 78 74  Wt. (Lbs) 205 205.2 201.6  BMI 32.11 32.14 31.58     Physical Exam   Assessment & Plan:   Problem List Items Addressed This Visit      Cardiovascular and Mediastinum   Essential hypertension - Primary      No orders of the defined types were placed in this encounter.   Follow-up: No Follow-up on file.   Alfonse Spruce FNP

## 2017-03-25 NOTE — Patient Instructions (Signed)
DASH Eating Plan DASH stands for "Dietary Approaches to Stop Hypertension." The DASH eating plan is a healthy eating plan that has been shown to reduce high blood pressure (hypertension). It may also reduce your risk for type 2 diabetes, heart disease, and stroke. The DASH eating plan may also help with weight loss. What are tips for following this plan? General guidelines  Avoid eating more than 2,300 mg (milligrams) of salt (sodium) a day. If you have hypertension, you may need to reduce your sodium intake to 1,500 mg a day.  Limit alcohol intake to no more than 1 drink a day for nonpregnant women and 2 drinks a day for men. One drink equals 12 oz of beer, 5 oz of wine, or 1 oz of hard liquor.  Work with your health care provider to maintain a healthy body weight or to lose weight. Ask what an ideal weight is for you.  Get at least 30 minutes of exercise that causes your heart to beat faster (aerobic exercise) most days of the week. Activities may include walking, swimming, or biking.  Work with your health care provider or diet and nutrition specialist (dietitian) to adjust your eating plan to your individual calorie needs. Reading food labels  Check food labels for the amount of sodium per serving. Choose foods with less than 5 percent of the Daily Value of sodium. Generally, foods with less than 300 mg of sodium per serving fit into this eating plan.  To find whole grains, look for the word "whole" as the first word in the ingredient list. Shopping  Buy products labeled as "low-sodium" or "no salt added."  Buy fresh foods. Avoid canned foods and premade or frozen meals. Cooking  Avoid adding salt when cooking. Use salt-free seasonings or herbs instead of table salt or sea salt. Check with your health care provider or pharmacist before using salt substitutes.  Do not fry foods. Cook foods using healthy methods such as baking, boiling, grilling, and broiling instead.  Cook with  heart-healthy oils, such as olive, canola, soybean, or sunflower oil. Meal planning   Eat a balanced diet that includes: ? 5 or more servings of fruits and vegetables each day. At each meal, try to fill half of your plate with fruits and vegetables. ? Up to 6-8 servings of whole grains each day. ? Less than 6 oz of lean meat, poultry, or fish each day. A 3-oz serving of meat is about the same size as a deck of cards. One egg equals 1 oz. ? 2 servings of low-fat dairy each day. ? A serving of nuts, seeds, or beans 5 times each week. ? Heart-healthy fats. Healthy fats called Omega-3 fatty acids are found in foods such as flaxseeds and coldwater fish, like sardines, salmon, and mackerel.  Limit how much you eat of the following: ? Canned or prepackaged foods. ? Food that is high in trans fat, such as fried foods. ? Food that is high in saturated fat, such as fatty meat. ? Sweets, desserts, sugary drinks, and other foods with added sugar. ? Full-fat dairy products.  Do not salt foods before eating.  Try to eat at least 2 vegetarian meals each week.  Eat more home-cooked food and less restaurant, buffet, and fast food.  When eating at a restaurant, ask that your food be prepared with less salt or no salt, if possible. What foods are recommended? The items listed may not be a complete list. Talk with your dietitian about what   dietary choices are best for you. Grains Whole-grain or whole-wheat bread. Whole-grain or whole-wheat pasta. Brown rice. Oatmeal. Quinoa. Bulgur. Whole-grain and low-sodium cereals. Pita bread. Low-fat, low-sodium crackers. Whole-wheat flour tortillas. Vegetables Fresh or frozen vegetables (raw, steamed, roasted, or grilled). Low-sodium or reduced-sodium tomato and vegetable juice. Low-sodium or reduced-sodium tomato sauce and tomato paste. Low-sodium or reduced-sodium canned vegetables. Fruits All fresh, dried, or frozen fruit. Canned fruit in natural juice (without  added sugar). Meat and other protein foods Skinless chicken or turkey. Ground chicken or turkey. Pork with fat trimmed off. Fish and seafood. Egg whites. Dried beans, peas, or lentils. Unsalted nuts, nut butters, and seeds. Unsalted canned beans. Lean cuts of beef with fat trimmed off. Low-sodium, lean deli meat. Dairy Low-fat (1%) or fat-free (skim) milk. Fat-free, low-fat, or reduced-fat cheeses. Nonfat, low-sodium ricotta or cottage cheese. Low-fat or nonfat yogurt. Low-fat, low-sodium cheese. Fats and oils Soft margarine without trans fats. Vegetable oil. Low-fat, reduced-fat, or light mayonnaise and salad dressings (reduced-sodium). Canola, safflower, olive, soybean, and sunflower oils. Avocado. Seasoning and other foods Herbs. Spices. Seasoning mixes without salt. Unsalted popcorn and pretzels. Fat-free sweets. What foods are not recommended? The items listed may not be a complete list. Talk with your dietitian about what dietary choices are best for you. Grains Baked goods made with fat, such as croissants, muffins, or some breads. Dry pasta or rice meal packs. Vegetables Creamed or fried vegetables. Vegetables in a cheese sauce. Regular canned vegetables (not low-sodium or reduced-sodium). Regular canned tomato sauce and paste (not low-sodium or reduced-sodium). Regular tomato and vegetable juice (not low-sodium or reduced-sodium). Pickles. Olives. Fruits Canned fruit in a light or heavy syrup. Fried fruit. Fruit in cream or butter sauce. Meat and other protein foods Fatty cuts of meat. Ribs. Fried meat. Bacon. Sausage. Bologna and other processed lunch meats. Salami. Fatback. Hotdogs. Bratwurst. Salted nuts and seeds. Canned beans with added salt. Canned or smoked fish. Whole eggs or egg yolks. Chicken or turkey with skin. Dairy Whole or 2% milk, cream, and half-and-half. Whole or full-fat cream cheese. Whole-fat or sweetened yogurt. Full-fat cheese. Nondairy creamers. Whipped toppings.  Processed cheese and cheese spreads. Fats and oils Butter. Stick margarine. Lard. Shortening. Ghee. Bacon fat. Tropical oils, such as coconut, palm kernel, or palm oil. Seasoning and other foods Salted popcorn and pretzels. Onion salt, garlic salt, seasoned salt, table salt, and sea salt. Worcestershire sauce. Tartar sauce. Barbecue sauce. Teriyaki sauce. Soy sauce, including reduced-sodium. Steak sauce. Canned and packaged gravies. Fish sauce. Oyster sauce. Cocktail sauce. Horseradish that you find on the shelf. Ketchup. Mustard. Meat flavorings and tenderizers. Bouillon cubes. Hot sauce and Tabasco sauce. Premade or packaged marinades. Premade or packaged taco seasonings. Relishes. Regular salad dressings. Where to find more information:  National Heart, Lung, and Blood Institute: www.nhlbi.nih.gov  American Heart Association: www.heart.org Summary  The DASH eating plan is a healthy eating plan that has been shown to reduce high blood pressure (hypertension). It may also reduce your risk for type 2 diabetes, heart disease, and stroke.  With the DASH eating plan, you should limit salt (sodium) intake to 2,300 mg a day. If you have hypertension, you may need to reduce your sodium intake to 1,500 mg a day.  When on the DASH eating plan, aim to eat more fresh fruits and vegetables, whole grains, lean proteins, low-fat dairy, and heart-healthy fats.  Work with your health care provider or diet and nutrition specialist (dietitian) to adjust your eating plan to your individual   calorie needs. This information is not intended to replace advice given to you by your health care provider. Make sure you discuss any questions you have with your health care provider. Document Released: 06/24/2011 Document Revised: 06/28/2016 Document Reviewed: 06/28/2016 Elsevier Interactive Patient Education  2017 [employer]  

## 2017-03-25 NOTE — Patient Instructions (Signed)
Your physician recommends that you schedule a follow-up appointment as needed  

## 2017-03-27 NOTE — Progress Notes (Signed)
Subjective:  Patient ID: Richard Calhoun, male    DOB: 1961-05-21  Age: 56 y.o. MRN: [id number]  CC: Hypertension   HPI Richard Calhoun presents for history of HTN. Patient denies chest pain, chest pressure/discomfort, claudication, dyspnea, lower extremity edema, orthopnea and palpitations. He reports following up with cardiologist referral today. He is adherent with metoprolol use. He does report history of SOB worsened with activity. He is a current smoker. He denies any fevers, chills, or hemoptysis.    Outpatient Medications Prior to Visit  Medication Sig Dispense Refill  . ibuprofen (ADVIL,MOTRIN) 600 MG tablet Take 1 tablet (600 mg total) by mouth every 8 (eight) hours as needed for moderate pain (Take with food). 30 tablet 0  . ketoconazole (NIZORAL) 2 % cream Apply 1 application topically daily. Apply to affected areas. 15 g 1  . triamcinolone (KENALOG) 0.025 % ointment Apply 1 application topically 2 (two) times daily. Apply to affected areas. 30 g 0  . losartan-hydrochlorothiazide (HYZAAR) 50-12.5 MG tablet Take 1 tablet by mouth daily. 90 tablet 0  . metoprolol tartrate (LOPRESSOR) 25 MG tablet Take 0.5 tablets (12.5 mg total) by mouth 2 (two) times daily. 30 tablet 1  . pantoprazole (PROTONIX) 40 MG tablet Take 1 tablet (40 mg total) by mouth daily. 90 tablet 0  . simvastatin (ZOCOR) 40 MG tablet Take 1 tablet (40 mg total) by mouth at bedtime. 90 tablet 0   No facility-administered medications prior to visit.     ROS Review of Systems  Constitutional: Negative.   HENT: Negative.   Eyes: Negative.   Respiratory: Positive for shortness of breath (DOE).   Cardiovascular: Negative.   Gastrointestinal: Negative.   Skin: Negative.   Neurological: Negative.    Objective:  BP 138/78 (BP Location: Left Arm, Patient Position: Sitting, Cuff Size: Normal)   Pulse 85   Temp 98.8 F (37.1 C) (Oral)   Resp 18   Ht 5\' 7"  (1.702 m)   Wt 205 lb 3.2 oz (93.1 kg)   SpO2 96%   BMI  32.14 kg/m   BP/Weight 03/25/2017 03/24/7590 12/19/8464  Systolic BP 599 357 017  Diastolic BP 70 78 74  Wt. (Lbs) 205 205.2 201.6  BMI 32.11 32.14 31.58    Physical Exam  Constitutional: He is oriented to person, place, and time. He appears well-developed and well-nourished.  HENT:  Head: Normocephalic and atraumatic.  Right Ear: External ear normal.  Left Ear: External ear normal.  Nose: Nose normal.  Mouth/Throat: Oropharynx is clear and moist.  Eyes: Pupils are equal, round, and reactive to light. Conjunctivae are normal.  Neck: Normal range of motion. Neck supple. No JVD present.  Cardiovascular: Normal rate, regular rhythm, normal heart sounds and intact distal pulses.   Pulmonary/Chest: Effort normal and breath sounds normal. No respiratory distress. He has no wheezes.  Abdominal: Soft. Bowel sounds are normal. There is no tenderness. A hernia is present. Hernia confirmed positive in the ventral area.    Musculoskeletal: Normal range of motion.  Lymphadenopathy:    He has no cervical adenopathy.  Neurological: He is alert and oriented to person, place, and time.  Skin: Skin is warm and dry.  Nursing note and vitals reviewed.  Assessment & Plan:   Problem List Items Addressed This Visit      Cardiovascular and Mediastinum   Essential hypertension - Primary   Relevant Medications   metoprolol tartrate (LOPRESSOR) 25 MG tablet   losartan (COZAAR) 50 MG tablet  simvastatin (ZOCOR) 40 MG tablet    Other Visit Diagnoses    DOE (dyspnea on exertion)       Relevant Orders   DG Chest 2 View   Medication refill       Per cardiology recommendations prinzide d/c. Losartan ordered.    Schedule BP recheck in 2 weeks with clinic RN.   If BP is greater than 90/60 (MAP 65 or greater) but not less than 130/80 may increase dose of losartan to 100 mg QD and recheck in another 2 weeks with clinic RN.    Relevant Medications   metoprolol tartrate (LOPRESSOR) 25 MG tablet    pantoprazole (PROTONIX) 40 MG tablet   simvastatin (ZOCOR) 40 MG tablet   Current every day smoker       Relevant Orders   DG Chest 2 View   Ventral hernia without obstruction or gangrene           Patient declines general surgery referral at this time. He is asymptomatic. Follow up as needed.      Meds ordered this encounter  Medications  . metoprolol tartrate (LOPRESSOR) 25 MG tablet    Sig: Take 1 tablet (25 mg total) by mouth 2 (two) times daily.    Dispense:  60 tablet    Refill:  2    Order Specific Question:   Supervising Provider    Answer:   Tresa Garter [id number]  . losartan (COZAAR) 50 MG tablet    Sig: Take 1 tablet (50 mg total) by mouth daily.    Dispense:  30 tablet    Refill:  2    Order Specific Question:   Supervising Provider    Answer:   Tresa Garter [id number]  . pantoprazole (PROTONIX) 40 MG tablet    Sig: Take 1 tablet (40 mg total) by mouth daily.    Dispense:  30 tablet    Refill:  3    Order Specific Question:   Supervising Provider    Answer:   Tresa Garter [id number]  . simvastatin (ZOCOR) 40 MG tablet    Sig: Take 1 tablet (40 mg total) by mouth at bedtime.    Dispense:  30 tablet    Refill:  2    Order Specific Question:   Supervising Provider    Answer:   Tresa Garter [id number]     Follow-up: Return in about 1 week (around 04/01/2017) for BP check with [facility].   Alfonse Spruce FNP

## 2017-04-14 ENCOUNTER — Ambulatory Visit: Payer: Self-pay | Attending: Family Medicine | Admitting: Family Medicine

## 2017-04-14 ENCOUNTER — Ambulatory Visit: Payer: Self-pay | Admitting: Internal Medicine

## 2017-04-14 ENCOUNTER — Other Ambulatory Visit: Payer: Self-pay

## 2017-04-14 ENCOUNTER — Other Ambulatory Visit: Payer: Self-pay | Admitting: Family Medicine

## 2017-04-14 ENCOUNTER — Inpatient Hospital Stay (HOSPITAL_COMMUNITY)
Admission: EM | Admit: 2017-04-14 | Discharge: 2017-04-21 | DRG: 247 | Disposition: A | Discharge status: Home / Self Care | Payer: Self-pay | Attending: Internal Medicine | Admitting: Internal Medicine

## 2017-04-14 ENCOUNTER — Encounter (HOSPITAL_COMMUNITY): Payer: Self-pay | Admitting: Emergency Medicine

## 2017-04-14 ENCOUNTER — Emergency Department (HOSPITAL_COMMUNITY): Payer: Self-pay

## 2017-04-14 VITALS — BP 136/80 | HR 80

## 2017-04-14 DIAGNOSIS — R42 Dizziness and giddiness: Secondary | ICD-10-CM | POA: Insufficient documentation

## 2017-04-14 DIAGNOSIS — Z791 Long term (current) use of non-steroidal anti-inflammatories (NSAID): Secondary | ICD-10-CM | POA: Insufficient documentation

## 2017-04-14 DIAGNOSIS — I2510 Atherosclerotic heart disease of native coronary artery without angina pectoris: Secondary | ICD-10-CM

## 2017-04-14 DIAGNOSIS — Z599 Problem related to housing and economic circumstances, unspecified: Secondary | ICD-10-CM

## 2017-04-14 DIAGNOSIS — E669 Obesity, unspecified: Secondary | ICD-10-CM | POA: Diagnosis present

## 2017-04-14 DIAGNOSIS — Z6831 Body mass index (BMI) 31.0-31.9, adult: Secondary | ICD-10-CM

## 2017-04-14 DIAGNOSIS — R079 Chest pain, unspecified: Secondary | ICD-10-CM | POA: Diagnosis present

## 2017-04-14 DIAGNOSIS — Z781 Physical restraint status: Secondary | ICD-10-CM

## 2017-04-14 DIAGNOSIS — R002 Palpitations: Secondary | ICD-10-CM | POA: Insufficient documentation

## 2017-04-14 DIAGNOSIS — R9431 Abnormal electrocardiogram [ECG] [EKG]: Secondary | ICD-10-CM | POA: Insufficient documentation

## 2017-04-14 DIAGNOSIS — Z79899 Other long term (current) drug therapy: Secondary | ICD-10-CM

## 2017-04-14 DIAGNOSIS — E785 Hyperlipidemia, unspecified: Secondary | ICD-10-CM | POA: Insufficient documentation

## 2017-04-14 DIAGNOSIS — Z720 Tobacco use: Secondary | ICD-10-CM

## 2017-04-14 DIAGNOSIS — M7989 Other specified soft tissue disorders: Secondary | ICD-10-CM | POA: Diagnosis not present

## 2017-04-14 DIAGNOSIS — I479 Paroxysmal tachycardia, unspecified: Secondary | ICD-10-CM | POA: Diagnosis present

## 2017-04-14 DIAGNOSIS — R402253 Coma scale, best verbal response, oriented, at hospital admission: Secondary | ICD-10-CM | POA: Diagnosis present

## 2017-04-14 DIAGNOSIS — J449 Chronic obstructive pulmonary disease, unspecified: Secondary | ICD-10-CM | POA: Diagnosis present

## 2017-04-14 DIAGNOSIS — M79642 Pain in left hand: Secondary | ICD-10-CM

## 2017-04-14 DIAGNOSIS — E784 Other hyperlipidemia: Secondary | ICD-10-CM

## 2017-04-14 DIAGNOSIS — F1029 Alcohol dependence with unspecified alcohol-induced disorder: Secondary | ICD-10-CM

## 2017-04-14 DIAGNOSIS — I208 Other forms of angina pectoris: Secondary | ICD-10-CM

## 2017-04-14 DIAGNOSIS — F10231 Alcohol dependence with withdrawal delirium: Secondary | ICD-10-CM | POA: Diagnosis not present

## 2017-04-14 DIAGNOSIS — I25110 Atherosclerotic heart disease of native coronary artery with unstable angina pectoris: Principal | ICD-10-CM | POA: Diagnosis present

## 2017-04-14 DIAGNOSIS — F10931 Alcohol use, unspecified with withdrawal delirium: Secondary | ICD-10-CM

## 2017-04-14 DIAGNOSIS — K219 Gastro-esophageal reflux disease without esophagitis: Secondary | ICD-10-CM | POA: Diagnosis present

## 2017-04-14 DIAGNOSIS — Z955 Presence of coronary angioplasty implant and graft: Secondary | ICD-10-CM

## 2017-04-14 DIAGNOSIS — R52 Pain, unspecified: Secondary | ICD-10-CM

## 2017-04-14 DIAGNOSIS — Z87898 Personal history of other specified conditions: Secondary | ICD-10-CM

## 2017-04-14 DIAGNOSIS — F17210 Nicotine dependence, cigarettes, uncomplicated: Secondary | ICD-10-CM | POA: Diagnosis present

## 2017-04-14 DIAGNOSIS — F419 Anxiety disorder, unspecified: Secondary | ICD-10-CM | POA: Diagnosis present

## 2017-04-14 DIAGNOSIS — R402363 Coma scale, best motor response, obeys commands, at hospital admission: Secondary | ICD-10-CM | POA: Diagnosis present

## 2017-04-14 DIAGNOSIS — R402143 Coma scale, eyes open, spontaneous, at hospital admission: Secondary | ICD-10-CM | POA: Diagnosis present

## 2017-04-14 DIAGNOSIS — F1020 Alcohol dependence, uncomplicated: Secondary | ICD-10-CM | POA: Diagnosis present

## 2017-04-14 DIAGNOSIS — E876 Hypokalemia: Secondary | ICD-10-CM

## 2017-04-14 DIAGNOSIS — E872 Acidosis: Secondary | ICD-10-CM | POA: Diagnosis not present

## 2017-04-14 DIAGNOSIS — I10 Essential (primary) hypertension: Secondary | ICD-10-CM | POA: Insufficient documentation

## 2017-04-14 DIAGNOSIS — I119 Hypertensive heart disease without heart failure: Secondary | ICD-10-CM | POA: Diagnosis present

## 2017-04-14 DIAGNOSIS — D531 Other megaloblastic anemias, not elsewhere classified: Secondary | ICD-10-CM | POA: Diagnosis present

## 2017-04-14 DIAGNOSIS — R600 Localized edema: Secondary | ICD-10-CM

## 2017-04-14 LAB — CBC
HCT: 37 % — ABNORMAL LOW (ref 39.0–52.0)
HEMATOCRIT: 33.7 % — AB (ref 39.0–52.0)
HEMOGLOBIN: 11.7 g/dL — AB (ref 13.0–17.0)
Hemoglobin: 12.5 g/dL — ABNORMAL LOW (ref 13.0–17.0)
MCH: 35.9 pg — ABNORMAL HIGH (ref 26.0–34.0)
MCH: 36.7 pg — ABNORMAL HIGH (ref 26.0–34.0)
MCHC: 33.8 g/dL (ref 30.0–36.0)
MCHC: 34.7 g/dL (ref 30.0–36.0)
MCV: 106.3 fL — ABNORMAL HIGH (ref 78.0–100.0)
MCV: 105.6 fL — AB (ref 78.0–100.0)
PLATELETS: UNDETERMINED 10*3/uL (ref 150–400)
Platelets: UNDETERMINED 10*3/uL (ref 150–400)
RBC: 3.48 MIL/uL — ABNORMAL LOW (ref 4.22–5.81)
RBC: 3.19 MIL/uL — ABNORMAL LOW (ref 4.22–5.81)
RDW: 13.8 % (ref 11.5–15.5)
RDW: 13.6 % (ref 11.5–15.5)
WBC: 5.3 10*3/uL (ref 4.0–10.5)
WBC: 5.3 10*3/uL (ref 4.0–10.5)

## 2017-04-14 LAB — BASIC METABOLIC PANEL
Anion gap: 8 (ref 5–15)
BUN: 19 mg/dL (ref 6–20)
CALCIUM: 9.5 mg/dL (ref 8.9–10.3)
CO2: 23 mmol/L (ref 22–32)
CREATININE: 0.85 mg/dL (ref 0.61–1.24)
Chloride: 111 mmol/L (ref 101–111)
GFR calc Af Amer: >60 mL/min (ref >60)
GLUCOSE: 103 mg/dL — AB (ref 65–99)
Potassium: 3.4 mmol/L — ABNORMAL LOW (ref 3.5–5.1)
SODIUM: 142 mmol/L (ref 135–145)

## 2017-04-14 LAB — I-STAT CHEM 8, ED
BUN: 20 mg/dL (ref 6–20)
CALCIUM ION: 1.19 mmol/L (ref 1.15–1.40)
CHLORIDE: 111 mmol/L (ref 101–111)
Creatinine, Ser: 0.7 mg/dL (ref 0.61–1.24)
GLUCOSE: 88 mg/dL (ref 65–99)
HCT: 34 % — ABNORMAL LOW (ref 39.0–52.0)
HEMOGLOBIN: 11.6 g/dL — AB (ref 13.0–17.0)
Potassium: 3.4 mmol/L — ABNORMAL LOW (ref 3.5–5.1)
SODIUM: 144 mmol/L (ref 135–145)
TCO2: 20 mmol/L — AB (ref 22–32)

## 2017-04-14 LAB — I-STAT TROPONIN, ED: TROPONIN I, POC: 0 ng/mL (ref 0.00–0.08)

## 2017-04-14 MED ORDER — FOLIC ACID 1 MG PO TABS
1.0000 mg | ORAL_TABLET | Freq: Every day | ORAL | Status: DC
  Administered 2017-04-15: 1 mg via ORAL
  Filled 2017-04-14: qty 1

## 2017-04-14 MED ORDER — ASPIRIN 81 MG PO CHEW
81.0000 mg | CHEWABLE_TABLET | Freq: Every day | ORAL | Status: DC
Start: 2017-04-14 — End: 2017-04-14
  Administered 2017-04-14: 81 mg via ORAL
  Filled 2017-04-14: qty 1

## 2017-04-14 MED ORDER — PANTOPRAZOLE SODIUM 40 MG PO TBEC
40.0000 mg | DELAYED_RELEASE_TABLET | Freq: Every day | ORAL | Status: DC
  Administered 2017-04-15: 40 mg via ORAL
  Filled 2017-04-14: qty 1

## 2017-04-14 MED ORDER — ASPIRIN 81 MG PO CHEW
81.0000 mg | CHEWABLE_TABLET | ORAL | Status: AC
  Administered 2017-04-15: 81 mg via ORAL
  Filled 2017-04-14: qty 1

## 2017-04-14 MED ORDER — VITAMIN C 500 MG PO TABS
500.0000 mg | ORAL_TABLET | Freq: Every day | ORAL | Status: DC
  Administered 2017-04-15 – 2017-04-21 (×6): 500 mg via ORAL
  Filled 2017-04-14 (×7): qty 1

## 2017-04-14 MED ORDER — SODIUM CHLORIDE 0.9% FLUSH: 3.0000 mL | INTRAVENOUS | Status: DC | PRN

## 2017-04-14 MED ORDER — ASPIRIN EC 325 MG PO TBEC
325.0000 mg | DELAYED_RELEASE_TABLET | Freq: Every day | ORAL | Status: DC
  Administered 2017-04-15 – 2017-04-19 (×4): 325 mg via ORAL
  Filled 2017-04-14 (×5): qty 1

## 2017-04-14 MED ORDER — ACETAMINOPHEN 325 MG PO TABS: 650.0000 mg | ORAL_TABLET | ORAL | Status: DC | PRN

## 2017-04-14 MED ORDER — SODIUM CHLORIDE 0.9 % IV SOLN: 250.0000 mL | INTRAVENOUS | Status: DC | PRN

## 2017-04-14 MED ORDER — ONDANSETRON HCL 4 MG/2ML IJ SOLN
4.0000 mg | Freq: Four times a day (QID) | INTRAMUSCULAR | Status: DC | PRN
  Administered 2017-04-21: 4 mg via INTRAVENOUS
  Filled 2017-04-14: qty 2

## 2017-04-14 MED ORDER — LOSARTAN POTASSIUM 50 MG PO TABS
50.0000 mg | ORAL_TABLET | Freq: Every day | ORAL | Status: DC
  Administered 2017-04-15 – 2017-04-19 (×4): 50 mg via ORAL
  Filled 2017-04-14 (×5): qty 1

## 2017-04-14 MED ORDER — SODIUM CHLORIDE 0.9% FLUSH
3.0000 mL | Freq: Two times a day (BID) | INTRAVENOUS | Status: DC
  Administered 2017-04-15 (×2): 3 mL via INTRAVENOUS

## 2017-04-14 MED ORDER — MORPHINE SULFATE (PF) 4 MG/ML IV SOLN
2.0000 mg | INTRAVENOUS | Status: DC | PRN
  Administered 2017-04-15 – 2017-04-17 (×4): 2 mg via INTRAVENOUS
  Filled 2017-04-14 (×4): qty 1

## 2017-04-14 MED ORDER — ASPIRIN 81 MG PO CHEW
324.0000 mg | CHEWABLE_TABLET | Freq: Once | ORAL | Status: AC
  Administered 2017-04-14: 324 mg via ORAL

## 2017-04-14 MED ORDER — METOPROLOL TARTRATE 25 MG PO TABS
25.0000 mg | ORAL_TABLET | Freq: Two times a day (BID) | ORAL | Status: DC
  Administered 2017-04-14 – 2017-04-15 (×2): 25 mg via ORAL
  Filled 2017-04-14 (×2): qty 1

## 2017-04-14 MED ORDER — LORAZEPAM 2 MG/ML IJ SOLN
1.0000 mg | Freq: Four times a day (QID) | INTRAMUSCULAR | Status: DC | PRN
  Administered 2017-04-15: 1 mg via INTRAVENOUS
  Filled 2017-04-14: qty 1

## 2017-04-14 MED ORDER — LORAZEPAM 1 MG PO TABS: 1.0000 mg | ORAL_TABLET | Freq: Four times a day (QID) | ORAL | Status: DC | PRN

## 2017-04-14 MED ORDER — LORAZEPAM 2 MG/ML IJ SOLN: 0.0000 mg | Freq: Two times a day (BID) | INTRAMUSCULAR | Status: DC

## 2017-04-14 MED ORDER — SIMVASTATIN 40 MG PO TABS
40.0000 mg | ORAL_TABLET | Freq: Every day | ORAL | Status: DC
  Administered 2017-04-15: 40 mg via ORAL
  Filled 2017-04-14: qty 1

## 2017-04-14 MED ORDER — METOPROLOL TARTRATE 25 MG PO TABS: 25.0000 mg | ORAL_TABLET | Freq: Two times a day (BID) | ORAL | Status: DC

## 2017-04-14 MED ORDER — ADULT MULTIVITAMIN W/MINERALS CH
1.0000 | ORAL_TABLET | Freq: Every day | ORAL | Status: DC
  Administered 2017-04-15: 1 via ORAL
  Filled 2017-04-14: qty 1

## 2017-04-14 MED ORDER — VITAMIN B-1 100 MG PO TABS
100.0000 mg | ORAL_TABLET | Freq: Every day | ORAL | Status: DC
  Administered 2017-04-15: 100 mg via ORAL
  Filled 2017-04-14: qty 1

## 2017-04-14 MED ORDER — POTASSIUM CHLORIDE CRYS ER 20 MEQ PO TBCR
40.0000 meq | EXTENDED_RELEASE_TABLET | Freq: Two times a day (BID) | ORAL | Status: AC
  Administered 2017-04-14 – 2017-04-15 (×2): 40 meq via ORAL
  Filled 2017-04-14 (×2): qty 2

## 2017-04-14 MED ORDER — LORAZEPAM 2 MG/ML IJ SOLN
0.0000 mg | Freq: Four times a day (QID) | INTRAMUSCULAR | Status: DC
  Administered 2017-04-14 – 2017-04-15 (×2): 2 mg via INTRAVENOUS
  Filled 2017-04-14 (×2): qty 1

## 2017-04-14 MED ORDER — THIAMINE HCL 100 MG/ML IJ SOLN: 100.0000 mg | Freq: Every day | INTRAMUSCULAR | Status: DC

## 2017-04-14 MED ORDER — SODIUM CHLORIDE 0.9 % WEIGHT BASED INFUSION
1.0000 mL/kg/h | INTRAVENOUS | Status: DC
  Administered 2017-04-15: 1 mL/kg/h via INTRAVENOUS

## 2017-04-14 MED ORDER — GI COCKTAIL ~~LOC~~: 30.0000 mL | Freq: Four times a day (QID) | ORAL | Status: DC | PRN

## 2017-04-14 MED ORDER — ENOXAPARIN SODIUM 40 MG/0.4ML ~~LOC~~ SOLN
40.0000 mg | Freq: Every day | SUBCUTANEOUS | Status: DC
  Administered 2017-04-16 – 2017-04-21 (×5): 40 mg via SUBCUTANEOUS
  Filled 2017-04-14 (×5): qty 0.4

## 2017-04-14 MED ORDER — SODIUM CHLORIDE 0.9 % WEIGHT BASED INFUSION
3.0000 mL/kg/h | INTRAVENOUS | Status: AC
  Administered 2017-04-15: 3 mL/kg/h via INTRAVENOUS

## 2017-04-14 NOTE — H&P (Signed)
History and Physical    CARTEL MAUSS VOZ:[id number] DOB: 1960-10-11 DOA: 04/14/2017  PCP: Alfonse Spruce, FNP   Patient coming from: PCP office  Chief Complaint: Exertional Chest Pain starting on Saturday  HPI: Richard Calhoun is a 56 y.o. male with medical history significant of HTN, HLD, Significant EtOH Abuse, Paroxysmal Tachycardia. GERD, Hx of Tobacco Abuse and other comorbids who presented to [facility] from his PCP office with a CC of Chest Pain with Exertion that started on Saturday. Patient states he has had chronic dizziness which was improved after he was placed on a Beta Blocker. He states he was dizzy all day Saturday and developed Left sided CP after he exerted himself taking out the garbage. States the CP was a dull 2/10 in intensity with no radiation but he felt some numbness on his left shoulder the next day. Patient states CP lasted about 5 minutes and went away after resting. Patient denied any N/V but stated he thinks he may have had some Diaphoresis and was SOB after. Patient also states that the CP is intermittent with Exertion and did not have any today when walked to the bus stop from his place to go to his PCP appointment. When asked about the CP the patient currently does not have any and states it had felt like a toothache. [facility] was called to admit for Chest Pain with Exertion and Cardiology was consulted by the EDP and they will see the patient.  Of note patient had a recent evaluation by Cardiology Dr. Debara Pickett and underwent an outpatient ECHOCardiogram.   ED Course: Labwork was obtained and patient was given full dose ASA in his PCP's office.  Review of Systems: As per HPI otherwise 10 point review of systems negative. Denied any Dysuria, Leg swelling, Vision difficulties.   Past Medical History:  Diagnosis Date  . ALLERGIC RHINITIS   . ANXIETY   . BICEPS TENDON RUPTURE, RIGHT   . DEPENDENCE, ALCOHOL NEC/NOS, UNSPECIFIED   . DEPRESSION   . GERD   . HYPERLIPIDEMIA     . HYPERTENSION   . Impaired glucose tolerance   . OSTEOARTHRITIS   . Tubular adenoma of colon 06/2006   Past Surgical History:  Procedure Laterality Date  . FOOT SURGERY    . MANDIBLE FRACTURE SURGERY     SOCIAL HISTORY  reports that he has been smoking.  He has never used smokeless tobacco. He reports that he drinks alcohol. He reports that he does not use drugs. Drinks significantly  Allergies  Allergen Reactions  . Penicillins Other (See Comments)    Childhood reaction Has patient had a PCN reaction causing immediate rash, facial/tongue/throat swelling, SOB or lightheadedness with hypotension: Unknown Has patient had a PCN reaction causing severe rash involving mucus membranes or skin necrosis: Unknown Has patient had a PCN reaction that required hospitalization: No Has patient had a PCN reaction occurring within the last 10 years: No If all of the above answers are "NO", then may proceed with Cephalosporin use.    Family History  Problem Relation Age of Onset  . Kidney disease Sister   . Stroke Mother   . Stroke Father    Prior to Admission medications   Medication Sig Start Date End Date Taking? Authorizing Provider  ibuprofen (ADVIL,MOTRIN) 200 MG tablet Take 800 mg by mouth every 6 (six) hours as needed for mild pain.   Yes [provider]  losartan (COZAAR) 50 MG tablet Take 1 tablet (50 mg total) by  mouth daily. 03/25/17  Yes Hairston, Maylon Peppers, FNP  metoprolol tartrate (LOPRESSOR) 25 MG tablet Take 1 tablet (25 mg total) by mouth 2 (two) times daily. 03/25/17  Yes Hairston, Maylon Peppers, FNP  multivitamin-iron-minerals-folic acid (CENTRUM) chewable tablet Chew 1 tablet by mouth daily.   Yes [provider]  pantoprazole (PROTONIX) 40 MG tablet Take 1 tablet (40 mg total) by mouth daily. 03/25/17  Yes Hairston, Maylon Peppers, FNP  simvastatin (ZOCOR) 40 MG tablet Take 1 tablet (40 mg total) by mouth at bedtime. 03/25/17  Yes Hairston, Toy Baker R, FNP  vitamin C  (ASCORBIC ACID) 500 MG tablet Take 500 mg by mouth daily.   Yes [provider]  ibuprofen (ADVIL,MOTRIN) 600 MG tablet Take 1 tablet (600 mg total) by mouth every 8 (eight) hours as needed for moderate pain (Take with food). Patient not taking: Reported on 04/14/2017 12/23/16   Alfonse Spruce, FNP  ketoconazole (NIZORAL) 2 % cream Apply 1 application topically daily. Apply to affected areas. Patient not taking: Reported on 04/14/2017 01/17/17   Alfonse Spruce, FNP  triamcinolone (KENALOG) 0.025 % ointment Apply 1 application topically 2 (two) times daily. Apply to affected areas. Patient not taking: Reported on 04/14/2017 01/17/17   Alfonse Spruce, FNP   Physical Exam: Vitals:   04/14/17 1601 04/14/17 1630 04/14/17 1645 04/14/17 1815  BP: (!) 173/92 (!) 177/91 (!) 172/92 (!) 183/87  Pulse: 81 81 78 83  Resp: (!) 22 15 16 16   Temp:      TempSrc:      SpO2: 97% 96% 95% 95%  Weight:      Height:       Constitutional: WN/WD Caucasian Male in NAD and appears calm and comfortable Eyes: Lids and conjunctivae normal, sclerae anicteric  ENMT: External Ears, Nose appear normal. Grossly normal hearing. Mucous membranes are moist.  Neck: Appears normal, supple, no cervical masses, normal ROM, no appreciable thyromegaly, no JVD Respiratory: Diminished to auscultation bilaterally with some mild rales. Normal respiratory effort and patient is not tachypenic. No accessory muscle use.  Cardiovascular: RRR, no murmurs / rubs / gallops. S1 and S2 auscultated. Mild 1+ LE extremity edema. 2+ pedal pulses.  Abdomen: Soft, non-tender, non-distended. No masses palpated. No appreciable hepatosplenomegaly. Bowel sounds positive.  GU: Deferred. Musculoskeletal: No clubbing / cyanosis of digits/nails. No joint deformity upper and lower extremities. Good ROM, no contractures.  Skin: No rashes, lesions, ulcers on a limited skin eval. No induration; Warm and dry.  Neurologic: CN 2-12 grossly intact  with no focal deficits. Sensation intact in all 4 Extremities. Romberg sign cerebellar reflexes not assessed.  Psychiatric: Normal judgment and insight. Alert and oriented x 3. Normal mood and appropriate affect.   Labs on Admission: I have personally reviewed following labs and imaging studies  CBC:  Recent Labs Lab 04/14/17 1300 04/14/17 1657  WBC 5.3  --   HGB 12.5* 11.6*  HCT 37.0* 34.0*  MCV 106.3*  --   PLT PLATELET CLUMPS NOTED ON SMEAR, UNABLE TO ESTIMATE  --    Basic Metabolic Panel:  Recent Labs Lab 04/14/17 1300 04/14/17 1657  NA 142 144  K 3.4* 3.4*  CL 111 111  CO2 23  --   GLUCOSE 103* 88  BUN 19 20  CREATININE 0.85 0.70  CALCIUM 9.5  --    GFR: Estimated Creatinine Clearance: 108.1 mL/min (by C-G formula based on SCr of 0.7 mg/dL). Liver Function Tests: No results for input(s): AST, ALT, ALKPHOS, BILITOT,  PROT, ALBUMIN in the last 168 hours. No results for input(s): LIPASE, AMYLASE in the last 168 hours. No results for input(s): AMMONIA in the last 168 hours. Coagulation Profile: No results for input(s): INR, PROTIME in the last 168 hours. Cardiac Enzymes: No results for input(s): CKTOTAL, CKMB, CKMBINDEX, TROPONINI in the last 168 hours. BNP (last 3 results) No results for input(s): PROBNP in the last 8760 hours. HbA1C: No results for input(s): HGBA1C in the last 72 hours. CBG: No results for input(s): GLUCAP in the last 168 hours. Lipid Profile: No results for input(s): CHOL, HDL, LDLCALC, TRIG, CHOLHDL, LDLDIRECT in the last 72 hours. Thyroid Function Tests: No results for input(s): TSH, T4TOTAL, FREET4, T3FREE, THYROIDAB in the last 72 hours. Anemia Panel: No results for input(s): VITAMINB12, FOLATE, FERRITIN, TIBC, IRON, RETICCTPCT in the last 72 hours. Urine analysis:    Component Value Date/Time   COLORURINE LT. YELLOW 08/29/2012 1520   APPEARANCEUR CLEAR 08/29/2012 1520   LABSPEC 1.020 08/29/2012 1520   PHURINE 6.0 08/29/2012 1520    GLUCOSEU NEGATIVE 08/29/2012 1520   HGBUR NEGATIVE 08/29/2012 1520   BILIRUBINUR NEGATIVE 08/29/2012 1520   KETONESUR NEGATIVE 08/29/2012 1520   UROBILINOGEN 0.2 08/29/2012 1520   NITRITE NEGATIVE 08/29/2012 1520   LEUKOCYTESUR NEGATIVE 08/29/2012 1520   Sepsis Labs: !!!!!!!!!!!!!!!!!!!!!!!!!!!!!!!!!!!!!!!!!!!! @LABRCNTIP (procalcitonin:4,lacticidven:4) )No results found for this or any previous visit (from the past 240 hour(s)).   Radiological Exams on Admission: Dg Chest 2 View  Result Date: 04/14/2017 CLINICAL DATA:  Left-sided chest pain and dizziness 5 days with left arm pain and tachycardia. EXAM: CHEST  2 VIEW COMPARISON:  None. FINDINGS: Lungs are adequately inflated and otherwise clear. Cardiomediastinal silhouette is within normal. Bones and soft tissues are normal. IMPRESSION: No active cardiopulmonary disease. Electronically Signed   By: Marin Olp M.D.   On: 04/14/2017 13:36    EKG: Independently reviewed. Showed a Sinus Rhythm with a rate of 75 with no evidence of ST Elevation on my interpretation.   Assessment/Plan Active Problems:   Hyperlipidemia   EtOH dependence ([place])   Essential hypertension   GERD   Hypertensive heart disease without heart failure   Chest pain on exertion   Hypokalemia   Tobacco abuse  Chest Pain with Exertion r/o ACS -Place in Obs Telemetry -Cardiology Consulted by EDP and will defer for further workup with Stress Testing to Cards -CXR showed No active cardiopulmonary disease -Cycle Troponins -Check Mag, Phos, TSH, Lipid Panel -ASA 325 mg po Daily -C/w BB, ARB and Statin -EKG showed NSR with no evidence of ST Elevation; Repeat EKG in AM   -Recent ECHO so will not repeat -Follow Cardiology Recc's and make NPO after midnight  Alcoholism/Alcohol Abuse -Patient Drinks significantly and has been for several years -CIWA with Lorazepam -C/w MVI, Folic Acid, and Thiamine -Check CMP in AM to evaluate LFTs  Hypokalemia -Mild at  3.4 -Replete -Continue to Monitor and Replete as Necessary -Repeat CMP in AM   HTN -C/w Losartan and Metoprolol  HLD -Check Lipid Panel -C/w Simvastatin 40 mg po qHS  GERD -C/w Pantoprazole 40 mg po Daily  Tobacco Abuse/Smoker -Smoking Cessation Counseling Given -Hold off Nicotine Patch until CP fully evaluated  DVT prophylaxis: Enoxaparin 40 mg sq q24h Code Status: FULL CODE Family Communication: No family present at bedside Disposition Plan: Suspect Home when Stable Consults called: Cardiology Dr. Meda Coffee called by EDP Admission status: Obs Telemetry   Severity of Illness: The appropriate patient status for this patient is OBSERVATION. Observation status is  judged to be reasonable and necessary in order to provide the required intensity of service to ensure the patient's safety. The patient's presenting symptoms, physical exam findings, and initial radiographic and laboratory data in the context of their medical condition is felt to place them at decreased risk for further clinical deterioration. Furthermore, it is anticipated that the patient will be medically stable for discharge from the hospital within 2 midnights of admission. The following factors support the patient status of observation.   " The patient's presenting symptoms include Chest Pain with Exertion. " The physical exam findings include mild LE edema " The initial radiographic and laboratory data are Reassuring.  Kerney Elbe, D.O. [facility] Pager [phone]  If 7PM-7AM, please contact night-coverage [email or web address] Password [facility]  04/14/2017, 6:45 PM

## 2017-04-14 NOTE — Progress Notes (Signed)
Subjective:  Patient ID: Richard Calhoun, male    DOB: 1961/05/02  Age: 56 y.o. MRN: [id number]  CC: Blood Pressure Check   HPI Richard Calhoun presents for blood pressure check with clinic RN. PMH includes HTN, HLD, and paroxysmal tachycardia. During visit he reports episode of chest pain over the weekend. He reports when he took out the garbage symptoms became worse. Symptoms of CP started around 8:30 am Saturday morning and persisted until Monday. CP is described as left sided,. CP he reports 2 to 4/10 at the worst. He reports radiation to the left arm that began on Sunday. Associated symptoms included dizziness, palpitations, he also states "gasping for air for a couple days". He currently denies any symptoms of chest pain or shortness of breath today.  EKG was performed and showed significant changes from last EKG. He was given aspirin. It was recommend he be referred to the Emergency Department for further evaluation. Patient reports he took the bus to office appointment and walked to clinic. He reports no one available to transport via private vehicle. When first responders arrived he did report some dizziness.   Outpatient Medications Prior to Visit  Medication Sig Dispense Refill  . ibuprofen (ADVIL,MOTRIN) 600 MG tablet Take 1 tablet (600 mg total) by mouth every 8 (eight) hours as needed for moderate pain (Take with food). 30 tablet 0  . losartan (COZAAR) 50 MG tablet Take 1 tablet (50 mg total) by mouth daily. 30 tablet 2  . metoprolol tartrate (LOPRESSOR) 25 MG tablet Take 1 tablet (25 mg total) by mouth 2 (two) times daily. 60 tablet 2  . pantoprazole (PROTONIX) 40 MG tablet Take 1 tablet (40 mg total) by mouth daily. 30 tablet 3  . simvastatin (ZOCOR) 40 MG tablet Take 1 tablet (40 mg total) by mouth at bedtime. 30 tablet 2  . ketoconazole (NIZORAL) 2 % cream Apply 1 application topically daily. Apply to affected areas. (Patient not taking: Reported on 04/14/2017) 15 g 1  . triamcinolone  (KENALOG) 0.025 % ointment Apply 1 application topically 2 (two) times daily. Apply to affected areas. (Patient not taking: Reported on 04/14/2017) 30 g 0   No facility-administered medications prior to visit.     ROS Review of Systems  Constitutional: Negative.   Eyes: Negative.   Respiratory: Negative.   Cardiovascular: Negative.        History chest pain/palpiations  Gastrointestinal: Negative.   Musculoskeletal: Negative.   Skin: Negative.   Neurological:       History of dizziness    Objective:  BP 136/80   Pulse 80   SpO2 96%   BP/Weight 04/14/2017 10/17/6604 3/0/1601  Systolic BP 093 235 573  Diastolic BP 80 70 78  Wt. (Lbs) - 205 205.2  BMI - 32.11 32.14    Physical Exam  Constitutional: He appears well-developed and well-nourished. No distress.  HENT:  Head: Normocephalic and atraumatic.  Eyes: Pupils are equal, round, and reactive to light. Conjunctivae are normal.  Neck: Normal range of motion. Neck supple. No JVD present.  Cardiovascular: Normal rate, normal heart sounds and intact distal pulses.  A regularly irregular rhythm present.  Pulmonary/Chest: Effort normal and breath sounds normal. No respiratory distress. He has no wheezes.  Abdominal: Soft. Bowel sounds are normal. There is no tenderness.  Skin: Skin is warm and dry. He is not diaphoretic.  Psychiatric: His mood appears anxious.  Nursing note and vitals reviewed.  Assessment & Plan:   1. History of  chest pain -Based on EKG findings and present symptoms recommend patient be referred to Emergency Department for closer follow up. -EKG 12 Lead - aspirin chewable tablet 324 mg; Chew 4 tablets (324 mg total) by mouth once.  2. Abnormal ECG -Based on EKG findings and present symptoms recommend patient be referred to Emergency Department for closer follow up. -EKG 12 Lead - aspirin chewable tablet 324 mg; Chew 4 tablets (324 mg total) by mouth once.   Meds ordered this encounter  Medications  .  aspirin chewable tablet 324 mg    Follow-up: Return for Go to the Emergency Department.   Alfonse Spruce FNP

## 2017-04-14 NOTE — ED Triage Notes (Signed)
[facility], is cp free now but was sent here for further eval. Pt hypertensive with EMS, takes bp meds, nad.

## 2017-04-14 NOTE — ED Provider Notes (Signed)
[facility] DEPT Provider Note   CSN: [id number] Arrival date & time: 04/14/17  1253     History   Chief Complaint Chief Complaint  Patient presents with  . Chest Pain    HPI Richard Calhoun is a 56 y.o. male.  The history is provided by the patient. No language interpreter was used.  Chest Pain   This is a new problem. The current episode started more than 2 days ago. The problem has been gradually worsening. The pain is at a severity of 5/10. The pain is moderate. The quality of the pain is described as brief. The pain does not radiate. Associated symptoms include exertional chest pressure and shortness of breath. He has tried nothing for the symptoms. The treatment provided moderate relief. There are no known risk factors.  His family medical history is significant for stroke.  Procedure history is negative for cardiac catheterization.  Pt reports he began having chest pain on Saturday.  Pt reports pain when walking.  Pain stops when he rest.  Pt reports he has had on and off until yesterday.  Pt saw his primary today who advised him to come to ED for evaluation   Past Medical History:  Diagnosis Date  . ALLERGIC RHINITIS   . ANXIETY   . BICEPS TENDON RUPTURE, RIGHT   . DEPENDENCE, ALCOHOL NEC/NOS, UNSPECIFIED   . DEPRESSION   . GERD   . HYPERLIPIDEMIA   . HYPERTENSION   . Impaired glucose tolerance   . OSTEOARTHRITIS   . Tubular adenoma of colon 06/2006    Patient Active Problem List   Diagnosis Date Noted  . Hypertensive heart disease without heart failure 03/25/2017  . Tachycardia 03/25/2017  . Infected sebaceous cyst of skin 10/23/2012  . Diarrhea 08/29/2012  . Impaired glucose tolerance 05/18/2011  . Preventative health care 05/16/2011  . BICEPS TENDON RUPTURE, RIGHT 06/01/2010  . ANXIETY 10/30/2009  . ANGIOEDEMA 04/24/2009  . Hyperlipidemia 02/21/2007  . EtOH dependence ([place]) 02/21/2007  . DEPRESSION 02/21/2007  . Essential hypertension 02/21/2007  .  HEMORRHOIDS 02/21/2007  . ALLERGIC RHINITIS 02/21/2007  . GERD 02/21/2007  . OSTEOARTHRITIS 02/21/2007  . LOW BACK PAIN 02/21/2007  . Headache(784.0) 02/21/2007  . COLONIC POLYPS, HX OF 02/21/2007    Past Surgical History:  Procedure Laterality Date  . FOOT SURGERY    . MANDIBLE FRACTURE SURGERY         Home Medications    Prior to Admission medications   Medication Sig Start Date End Date Taking? Authorizing Provider  ibuprofen (ADVIL,MOTRIN) 600 MG tablet Take 1 tablet (600 mg total) by mouth every 8 (eight) hours as needed for moderate pain (Take with food). 12/23/16   Alfonse Spruce, FNP  ketoconazole (NIZORAL) 2 % cream Apply 1 application topically daily. Apply to affected areas. Patient not taking: Reported on 04/14/2017 01/17/17   Alfonse Spruce, FNP  losartan (COZAAR) 50 MG tablet Take 1 tablet (50 mg total) by mouth daily. 03/25/17   Alfonse Spruce, FNP  metoprolol tartrate (LOPRESSOR) 25 MG tablet Take 1 tablet (25 mg total) by mouth 2 (two) times daily. 03/25/17   Alfonse Spruce, FNP  pantoprazole (PROTONIX) 40 MG tablet Take 1 tablet (40 mg total) by mouth daily. 03/25/17   Alfonse Spruce, FNP  simvastatin (ZOCOR) 40 MG tablet Take 1 tablet (40 mg total) by mouth at bedtime. 03/25/17   Alfonse Spruce, FNP  triamcinolone (KENALOG) 0.025 % ointment Apply 1 application topically 2 (two)  times daily. Apply to affected areas. Patient not taking: Reported on 04/14/2017 01/17/17   Alfonse Spruce, FNP    Family History Family History  Problem Relation Age of Onset  . Kidney disease Sister   . Stroke Mother   . Stroke Father     Social History Social History  Substance Use Topics  . Smoking status: Current Every Day Smoker  . Smokeless tobacco: Never Used     Comment: states he is smoking 1 pack 1/2 now that he is unemployed   . Alcohol use 0.0 oz/week     Allergies   Penicillins   Review of Systems Review of Systems  Respiratory:  Positive for shortness of breath.   Cardiovascular: Positive for chest pain.  All other systems reviewed and are negative.    Physical Exam Updated Vital Signs BP (!) 173/92 (BP Location: Left Arm)   Pulse 81   Temp 98.4 F (36.9 C) (Oral)   Resp (!) 22   Ht 5\' 7"  (1.702 m)   Wt 90.7 kg (200 lb)   SpO2 97%   BMI 31.32 kg/m   Physical Exam  Constitutional: He appears well-developed and well-nourished.  HENT:  Head: Normocephalic and atraumatic.  Eyes: Conjunctivae are normal.  Neck: Neck supple.  Cardiovascular: Normal rate and regular rhythm.   No murmur heard. Pulmonary/Chest: Effort normal and breath sounds normal. No respiratory distress.  Abdominal: Soft. There is no tenderness.  Musculoskeletal: He exhibits no edema.  Neurological: He is alert.  Skin: Skin is warm and dry.  Psychiatric: He has a normal mood and affect.  Nursing note and vitals reviewed.    ED Treatments / Results  Labs (all labs ordered are listed, but only abnormal results are displayed) Labs Reviewed  BASIC METABOLIC PANEL - Abnormal; Notable for the following:       Result Value   Potassium 3.4 (*)    Glucose, Bld 103 (*)    All other components within normal limits  CBC - Abnormal; Notable for the following:    RBC 3.48 (*)    Hemoglobin 12.5 (*)    HCT 37.0 (*)    MCV 106.3 (*)    MCH 35.9 (*)    All other components within normal limits  I-STAT TROPONIN, ED  I-STAT CHEM 8, ED    EKG  EKG Interpretation None       Radiology Dg Chest 2 View  Result Date: 04/14/2017 CLINICAL DATA:  Left-sided chest pain and dizziness 5 days with left arm pain and tachycardia. EXAM: CHEST  2 VIEW COMPARISON:  None. FINDINGS: Lungs are adequately inflated and otherwise clear. Cardiomediastinal silhouette is within normal. Bones and soft tissues are normal. IMPRESSION: No active cardiopulmonary disease. Electronically Signed   By: Marin Olp M.D.   On: 04/14/2017 13:36     Procedures Procedures (including critical care time)  Medications Ordered in ED Medications - No data to display   Initial Impression / Assessment and Plan / ED Course  I have reviewed the triage vital signs and the nursing notes.  Pertinent labs & imaging results that were available during my care of the patient were reviewed by me and considered in my medical decision making (see chart for details).  Clinical Course as of Apr 14 1626  Thu Apr 14, 2017  1626 ED EKG within 10 minutes [LS]    Clinical Course User Index [LS] Fransico Meadow, [place]    Troponin negative,  I suspect pt is having  exertional angina.  I reviewed with Cardiology Dr. Meda Coffee  who advised they will see and consult  Request hospitalist to admit.   I spoke to Dr. Alfredia Ferguson who will admit.   Final Clinical Impressions(s) / ED Diagnoses   Final diagnoses:  Chest pain, unspecified type    New Prescriptions New Prescriptions   No medications on file     Sidney Ace 04/14/17 1808    Macarthur Critchley, MD 04/20/17 0009

## 2017-04-14 NOTE — Consult Note (Signed)
Cardiology Consultation:   Patient ID: Richard Calhoun; [id number]; Sep 06, 1960   Admit date: 04/14/2017 Date of Consult: 04/14/2017  Primary Care Provider: Alfonse Spruce, FNP Primary Cardiologist: Dr. Debara Pickett Primary Electrophysiologist:  None   Patient Profile:   Richard Calhoun is a 56 y.o. male with a hx of HTN, HLD, EtOH abuse and tobacco abuse who is being seen today for the evaluation of chest pain at the request of Dr. Alfredia Ferguson.  History of Present Illness:   [name] is a obese 56 year old Caucasian male with past medical history of hypertension, hyperlipidemia, EtOH abuse who drink 1/5 bottle of rum per day, and tobacco abuse. He was recently seen by Dr. Debara Pickett for evaluation of paroxysmal tachycardia, symptoms improved after addition of beta blocker. He had an echocardiogram obtained on 02/01/2017 which showed EF 60-65%, grade 1 DD, mild AI.   Starting last Saturday, patient started having chest pain. The first time he had chest pain was taking garbage out, it relieved with rest. He had recurrent chest pain the next day and also Monday as well. He describes this symptom as a left-sided dull pain. It only occurs with exertion and relieved by rest. He denies any other exacerbating factors such as palpation, deep inspiration and body rotation.He also did endorse some degree of exertional shortness of breath as well. He previously had dizziness in the past, this has been improved while on the beta blocker. He denies any significant family history of CAD although both parents had a stroke. He has been smoking since high school. On initial arrival in the ED, EKG showed sinus rhythm, poor R wave progression in the anterior leads.  Troponin was negative. Chest x-ray negative as well.   Past Medical History:  Diagnosis Date  . ALLERGIC RHINITIS   . ANXIETY   . BICEPS TENDON RUPTURE, RIGHT   . DEPENDENCE, ALCOHOL NEC/NOS, UNSPECIFIED   . DEPRESSION   . GERD   . HYPERLIPIDEMIA   .  HYPERTENSION   . Impaired glucose tolerance   . OSTEOARTHRITIS   . Tubular adenoma of colon 06/2006    Past Surgical History:  Procedure Laterality Date  . FOOT SURGERY    . MANDIBLE FRACTURE SURGERY       Home Medications:  Prior to Admission medications   Medication Sig Start Date End Date Taking? Authorizing Provider  ibuprofen (ADVIL,MOTRIN) 200 MG tablet Take 800 mg by mouth every 6 (six) hours as needed for mild pain.   Yes [provider]  losartan (COZAAR) 50 MG tablet Take 1 tablet (50 mg total) by mouth daily. 03/25/17  Yes Hairston, Maylon Peppers, FNP  metoprolol tartrate (LOPRESSOR) 25 MG tablet Take 1 tablet (25 mg total) by mouth 2 (two) times daily. 03/25/17  Yes Hairston, Maylon Peppers, FNP  multivitamin-iron-minerals-folic acid (CENTRUM) chewable tablet Chew 1 tablet by mouth daily.   Yes [provider]  pantoprazole (PROTONIX) 40 MG tablet Take 1 tablet (40 mg total) by mouth daily. 03/25/17  Yes Hairston, Maylon Peppers, FNP  simvastatin (ZOCOR) 40 MG tablet Take 1 tablet (40 mg total) by mouth at bedtime. 03/25/17  Yes Hairston, Toy Baker R, FNP  vitamin C (ASCORBIC ACID) 500 MG tablet Take 500 mg by mouth daily.   Yes [provider]  ibuprofen (ADVIL,MOTRIN) 600 MG tablet Take 1 tablet (600 mg total) by mouth every 8 (eight) hours as needed for moderate pain (Take with food). Patient not taking: Reported on 04/14/2017 12/23/16   Alfonse Spruce,  FNP  ketoconazole (NIZORAL) 2 % cream Apply 1 application topically daily. Apply to affected areas. Patient not taking: Reported on 04/14/2017 01/17/17   Alfonse Spruce, FNP  triamcinolone (KENALOG) 0.025 % ointment Apply 1 application topically 2 (two) times daily. Apply to affected areas. Patient not taking: Reported on 04/14/2017 01/17/17   Alfonse Spruce, FNP    Inpatient Medications: Scheduled Meds: . potassium chloride  40 mEq Oral BID   Continuous Infusions:  PRN Meds:   Allergies:      Allergies  Allergen Reactions  . Penicillins Other (See Comments)    Childhood reaction Has patient had a PCN reaction causing immediate rash, facial/tongue/throat swelling, SOB or lightheadedness with hypotension: Unknown Has patient had a PCN reaction causing severe rash involving mucus membranes or skin necrosis: Unknown Has patient had a PCN reaction that required hospitalization: No Has patient had a PCN reaction occurring within the last 10 years: No If all of the above answers are "NO", then may proceed with Cephalosporin use.     Social History:   Social History   Social History  . Marital status: Single    Spouse name: N/A  . Number of children: N/A  . Years of education: N/A   Occupational History  . unemployed    Social History Main Topics  . Smoking status: Current Every Day Smoker  . Smokeless tobacco: Never Used     Comment: states he is smoking 1 pack 1/2 now that he is unemployed   . Alcohol use 0.0 oz/week  . Drug use: No  . Sexual activity: Not on file   Other Topics Concern  . Not on file   Social History Narrative  . No narrative on file    Family History:    Family History  Problem Relation Age of Onset  . Kidney disease Sister   . Stroke Mother   . Stroke Father      ROS:  Please see the history of present illness.  ROS  All other ROS reviewed and negative.     Physical Exam/Data:   Vitals:   04/14/17 1630 04/14/17 1645 04/14/17 1815 04/14/17 1830  BP: (!) 177/91 (!) 172/92 (!) 183/87 (!) 193/86  Pulse: 81 78 83 96  Resp: 15 16 16 17   Temp:      TempSrc:      SpO2: 96% 95% 95% 97%  Weight:      Height:       No intake or output data in the 24 hours ending 04/14/17 1958 Filed Weights   04/14/17 1253  Weight: 200 lb (90.7 kg)   Body mass index is 31.32 kg/m.  General:  Well nourished, well developed, in no acute distress HEENT: normal Lymph: no adenopathy Neck: no JVD Endocrine:  No thryomegaly Vascular: No carotid  bruits; FA pulses 2+ bilaterally without bruits  Cardiac:  normal S1, S2; RRR; no murmur  Lungs:  clear to auscultation bilaterally, no wheezing, rhonchi or rales  Abd: soft, nontender, no hepatomegaly  Ext: no edema Musculoskeletal:  No deformities, BUE and BLE strength normal and equal Skin: warm and dry  Neuro:  CNs 2-12 intact, no focal abnormalities noted Psych:  Normal affect   EKG:  The EKG was personally reviewed and demonstrates:  Sinus rhythm, poor R wave progression in anterior leads, no significant ST-T wave changes Telemetry:  Telemetry was personally reviewed and demonstrates:  Normal sinus rhythm with occasional paroxysmal atrial tachycardia. No obvious sign of atrial fibrillation  Relevant CV Studies:  Echo 02/02/2016 LV EF: 60% -   65%  Study Conclusions  - Left ventricle: The cavity size was normal. Wall thickness was   increased in a pattern of moderate LVH. Systolic function was   normal. The estimated ejection fraction was in the range of 60%   to 65%. Wall motion was normal; there were no regional wall   motion abnormalities. Doppler parameters are consistent with   abnormal left ventricular relaxation (grade 1 diastolic   dysfunction). There was no evidence of elevated ventricular   filling pressure by Doppler parameters. - Aortic valve: Probably trileaflet; mildly thickened leaflets.   Transvalvular velocity was within the normal range. There was no   stenosis. There was mild regurgitation. - Ascending aorta: The ascending aorta was normal in size. - Mitral valve: There was trivial regurgitation. - Right atrium: The atrium was normal in size. - Pulmonic valve: There was trivial regurgitation. - Pulmonary arteries: Systolic pressure was within the normal   range. - Inferior vena cava: The vessel was normal in size. - Pericardium, extracardiac: There was no pericardial effusion.\\   Laboratory Data:  Chemistry  Recent Labs Lab 04/14/17 1300  04/14/17 1657  NA 142 144  K 3.4* 3.4*  CL 111 111  CO2 23  --   GLUCOSE 103* 88  BUN 19 20  CREATININE 0.85 0.70  CALCIUM 9.5  --   GFRNONAA >60  --   GFRAA >60  --   ANIONGAP 8  --     No results for input(s): PROT, ALBUMIN, AST, ALT, ALKPHOS, BILITOT in the last 168 hours. Hematology  Recent Labs Lab 04/14/17 1300 04/14/17 1657  WBC 5.3  --   RBC 3.48*  --   HGB 12.5* 11.6*  HCT 37.0* 34.0*  MCV 106.3*  --   MCH 35.9*  --   MCHC 33.8  --   RDW 13.8  --   PLT PLATELET CLUMPS NOTED ON SMEAR, UNABLE TO ESTIMATE  --    Cardiac EnzymesNo results for input(s): TROPONINI in the last 168 hours.   Recent Labs Lab 04/14/17 1313  TROPIPOC 0.00    BNPNo results for input(s): BNP, PROBNP in the last 168 hours.  DDimer No results for input(s): DDIMER in the last 168 hours.  Radiology/Studies:  Dg Chest 2 View  Result Date: 04/14/2017 CLINICAL DATA:  Left-sided chest pain and dizziness 5 days with left arm pain and tachycardia. EXAM: CHEST  2 VIEW COMPARISON:  None. FINDINGS: Lungs are adequately inflated and otherwise clear. Cardiomediastinal silhouette is within normal. Bones and soft tissues are normal. IMPRESSION: No active cardiopulmonary disease. Electronically Signed   By: Marin Olp M.D.   On: 04/14/2017 13:36    Assessment and Plan:   1. Exertional chest pain: symptom concerning for class III angina, only occurs with exertion and relieved by rest. Cardiac risk factors include hypertension and hyperlipidemia. Will discuss ischemic workup was M.D., however I would favor cardiac catheterization given the exertional nature.  2. Hypertension: blood pressure elevated, restart home blood pressure medication, uptitrate metoprolol to 50 mg twice a day  3. Hyperlipidemia: Currently on Zocor 40 mg daily.  4. EtOH abuse: Drink 1/5 of bottle of rum per day  5. Tobacco abuse: discussed tobacco cessation.   For questions or updates, please contact [place] Please  consult [email or web address] for contact info under Cardiology/STEMI.   Hilbert Corrigan, [place]  04/14/2017 7:58 PM   The patient was seen and examined, and  I agree with the history, physical exam, assessment and plan as documented above, with modifications as noted below. I have also personally reviewed all relevant documentation, old records, labs, and both radiographic and cardiovascular studies. I have also independently interpreted old and new ECG's.  56 yr old male with aforementioned history recently evaluated for paroxysmal tachycardia by Dr. Debara Pickett on 03/25/17.  He was started on metoprolol 12.5 mg BID by his PCP and symptoms resolved. He reports having a HR of 150 bpm but I find no ECG tracings demonstrating this. He has also been experiencing episodic dizziness for the past month which also resolved with metoprolol.  This past Saturday, he walked 20 yards to take out the garbage and developed significant precordial chest pain associated with dizziness and shortness of breath. He sat down and after several minutes, the symptoms resolved. On Sunday, he was walking up the stairs and he had similar symptom, this time with radiation to the left shoulder and arm. Symptoms resolved with rest. He again had exertional symptoms on Monday and then yesterday. He describes near syncope on one occasion. He said he used to walk 5-6 miles daily until he lost his job so these symptoms were very concerning. He walked 2-3 blocks to see his PCP and was sent to the ED for ECG abnormalities.  ECG on 03/25/17 which I personally interpreted demonstrates normal sinus rhythm with no ischemic ST-T abnormalities.  ECG today shows sinus rhythm with R wave transition in V4 (rather than V3 as seen on 9/7).  He presently denies chest pain and shortness of breath.  He has smoked 1.5 packs of cigarettes per day since high school and quit a year ago. He now vapes.  He has been markedly hypertensive in the ED with SBP's in the  190 mmHg range.  Troponin is normal. Chest xray is normal. Recent echocardiogram reviewed above and demonstrative of hypertensive heart disease with normal LV systolic function and regional wall motion. K mildly low at 3.4  Physical exam notable for diffuse faint end-expiratory wheezes. Does not carry a formal diagnosis of COPD.  Recommendations: Given the progressive nature of his symptoms, I am concerned that this is indicative of accelerating angina (CCS class III). I will arrange for coronary angiography. Risks and benefits of cardiac catheterization have been discussed with the patient.  These include bleeding, infection, kidney damage, stroke, heart attack, death.  The patient understands these risks and is willing to proceed.  I will increase metoprolol to 25 mg BID (he told me he was taking 12.5 mg BID) and start ASA 81 mg daily until angiography is performed. He is already on losartan and simvastatin, which I would continue. Further medication management to be determined after cath.   Kate Sable, MD, [facility]  04/14/2017 8:33 PM

## 2017-04-14 NOTE — Progress Notes (Signed)
Pt arrived to [facility]. Pt alert and oriented and arrives in good spirits. Last OV  03/25/17 with PCP. His blood pressure at the office visit was 138/78. Medication reviewed.    He states he has dizziness and palpitations  intermittent since Friday. Noticeable  SOB on Saturday. Today he doesn't feel bad even after walking to the bus stop.    Manual blood pressure reading: 140/ 70 and 136/80   Plan-  Pt to f/u with PCP today.

## 2017-04-14 NOTE — Patient Instructions (Addendum)
Go to the Emergency Department for further evaluation.

## 2017-04-15 ENCOUNTER — Encounter (HOSPITAL_COMMUNITY): Payer: Self-pay | Admitting: Interventional Cardiology

## 2017-04-15 ENCOUNTER — Other Ambulatory Visit: Payer: Self-pay | Admitting: *Deleted

## 2017-04-15 ENCOUNTER — Inpatient Hospital Stay (HOSPITAL_COMMUNITY)
Admission: EM | Disposition: A | Discharge status: Home / Self Care | Payer: Self-pay | Source: Home / Self Care | Attending: Emergency Medicine

## 2017-04-15 DIAGNOSIS — R079 Chest pain, unspecified: Secondary | ICD-10-CM | POA: Diagnosis present

## 2017-04-15 DIAGNOSIS — I25110 Atherosclerotic heart disease of native coronary artery with unstable angina pectoris: Principal | ICD-10-CM

## 2017-04-15 DIAGNOSIS — I2510 Atherosclerotic heart disease of native coronary artery without angina pectoris: Secondary | ICD-10-CM

## 2017-04-15 DIAGNOSIS — F10239 Alcohol dependence with withdrawal, unspecified: Secondary | ICD-10-CM

## 2017-04-15 HISTORY — PX: INTRAVASCULAR PRESSURE WIRE/FFR STUDY: CATH118243

## 2017-04-15 HISTORY — PX: LEFT HEART CATH AND CORONARY ANGIOGRAPHY: CATH118249

## 2017-04-15 LAB — CBC
HEMATOCRIT: 35.1 % — AB (ref 39.0–52.0)
Hemoglobin: 11.8 g/dL — ABNORMAL LOW (ref 13.0–17.0)
MCH: 35.9 pg — ABNORMAL HIGH (ref 26.0–34.0)
MCHC: 33.6 g/dL (ref 30.0–36.0)
MCV: 106.7 fL — AB (ref 78.0–100.0)
Platelets: UNDETERMINED 10*3/uL (ref 150–400)
RBC: 3.29 MIL/uL — AB (ref 4.22–5.81)
RDW: 13.2 % (ref 11.5–15.5)
WBC: 5.4 10*3/uL (ref 4.0–10.5)

## 2017-04-15 LAB — HEMOGLOBIN A1C
Hgb A1c MFr Bld: 5.9 % — ABNORMAL HIGH (ref 4.8–5.6)
MEAN PLASMA GLUCOSE: 122.63 mg/dL

## 2017-04-15 LAB — BASIC METABOLIC PANEL
ANION GAP: 9 (ref 5–15)
BUN: 17 mg/dL (ref 6–20)
CHLORIDE: 107 mmol/L (ref 101–111)
CO2: 22 mmol/L (ref 22–32)
Calcium: 9.1 mg/dL (ref 8.9–10.3)
Creatinine, Ser: 0.78 mg/dL (ref 0.61–1.24)
GFR calc Af Amer: >60 mL/min (ref >60)
GFR calc non Af Amer: >60 mL/min (ref >60)
GLUCOSE: 91 mg/dL (ref 65–99)
POTASSIUM: 3.4 mmol/L — AB (ref 3.5–5.1)
Sodium: 138 mmol/L (ref 135–145)

## 2017-04-15 LAB — CREATININE, SERUM
Creatinine, Ser: 0.9 mg/dL (ref 0.61–1.24)
GFR calc non Af Amer: >60 mL/min (ref >60)

## 2017-04-15 LAB — POCT ACTIVATED CLOTTING TIME: ACTIVATED CLOTTING TIME: 290 s

## 2017-04-15 LAB — TSH: TSH: 3.117 u[IU]/mL (ref 0.350–4.500)

## 2017-04-15 LAB — PROTIME-INR
INR: 1.16
Prothrombin Time: 14.7 seconds (ref 11.4–15.2)

## 2017-04-15 LAB — TROPONIN I
Troponin I: <0.03 ng/mL (ref <0.03)
Troponin I: <0.03 ng/mL (ref <0.03)

## 2017-04-15 LAB — LIPID PANEL
CHOL/HDL RATIO: 3 ratio
CHOLESTEROL: 189 mg/dL (ref 0–200)
HDL: 62 mg/dL (ref >40)
LDL Cholesterol: 99 mg/dL (ref 0–99)
Triglycerides: 141 mg/dL (ref <150)
VLDL: 28 mg/dL (ref 0–40)

## 2017-04-15 LAB — MAGNESIUM: Magnesium: 0.9 mg/dL — CL (ref 1.7–2.4)

## 2017-04-15 LAB — HIV ANTIBODY (ROUTINE TESTING W REFLEX): HIV Screen 4th Generation wRfx: NONREACTIVE

## 2017-04-15 LAB — PHOSPHORUS: Phosphorus: 2.7 mg/dL (ref 2.5–4.6)

## 2017-04-15 SURGERY — LEFT HEART CATH AND CORONARY ANGIOGRAPHY
Anesthesia: LOCAL

## 2017-04-15 MED ORDER — MAGNESIUM CHLORIDE 64 MG PO TBEC
2.0000 | DELAYED_RELEASE_TABLET | Freq: Two times a day (BID) | ORAL | Status: DC
  Administered 2017-04-15: 128 mg via ORAL
  Filled 2017-04-15 (×2): qty 2

## 2017-04-15 MED ORDER — SODIUM CHLORIDE 0.9% FLUSH
3.0000 mL | Freq: Two times a day (BID) | INTRAVENOUS | Status: DC
  Administered 2017-04-15 – 2017-04-20 (×7): 3 mL via INTRAVENOUS

## 2017-04-15 MED ORDER — HEPARIN (PORCINE) IN NACL 2-0.9 UNIT/ML-% IJ SOLN
INTRAMUSCULAR | Status: AC
  Filled 2017-04-15: qty 1000

## 2017-04-15 MED ORDER — IOPAMIDOL (ISOVUE-370) INJECTION 76%
INTRAVENOUS | Status: AC
  Filled 2017-04-15: qty 50

## 2017-04-15 MED ORDER — LIDOCAINE HCL 2 % IJ SOLN
INTRAMUSCULAR | Status: AC
  Filled 2017-04-15: qty 10

## 2017-04-15 MED ORDER — LORAZEPAM 1 MG PO TABS
1.0000 mg | ORAL_TABLET | Freq: Four times a day (QID) | ORAL | Status: DC | PRN
Start: 2017-04-15 — End: 2017-04-16
  Administered 2017-04-15 (×2): 1 mg via ORAL
  Filled 2017-04-15 (×2): qty 1

## 2017-04-15 MED ORDER — NITROGLYCERIN 1 MG/10 ML FOR IR/CATH LAB
INTRA_ARTERIAL | Status: DC | PRN
  Administered 2017-04-15: 200 ug via INTRACORONARY

## 2017-04-15 MED ORDER — MIDAZOLAM HCL 2 MG/2ML IJ SOLN
INTRAMUSCULAR | Status: DC | PRN
  Administered 2017-04-15 (×2): 1 mg via INTRAVENOUS

## 2017-04-15 MED ORDER — ASPIRIN 81 MG PO CHEW: 81.0000 mg | CHEWABLE_TABLET | Freq: Every day | ORAL | Status: DC

## 2017-04-15 MED ORDER — HEPARIN (PORCINE) IN NACL 2-0.9 UNIT/ML-% IJ SOLN
INTRAMUSCULAR | Status: AC | PRN
  Administered 2017-04-15: 1000 mL via INTRA_ARTERIAL

## 2017-04-15 MED ORDER — ATORVASTATIN CALCIUM 80 MG PO TABS
80.0000 mg | ORAL_TABLET | Freq: Every day | ORAL | Status: DC
  Administered 2017-04-15 – 2017-04-20 (×5): 80 mg via ORAL
  Filled 2017-04-15 (×6): qty 1

## 2017-04-15 MED ORDER — MAGNESIUM CHLORIDE 64 MG PO TBEC: 2.0000 | DELAYED_RELEASE_TABLET | Freq: Two times a day (BID) | ORAL | Status: DC

## 2017-04-15 MED ORDER — NITROGLYCERIN 1 MG/10 ML FOR IR/CATH LAB
INTRA_ARTERIAL | Status: AC
  Filled 2017-04-15: qty 10

## 2017-04-15 MED ORDER — VERAPAMIL HCL 2.5 MG/ML IV SOLN
INTRAVENOUS | Status: AC
  Filled 2017-04-15: qty 2

## 2017-04-15 MED ORDER — ADENOSINE 12 MG/4ML IV SOLN
INTRAVENOUS | Status: AC
  Filled 2017-04-15: qty 16

## 2017-04-15 MED ORDER — ISOSORBIDE MONONITRATE ER 30 MG PO TB24
30.0000 mg | ORAL_TABLET | Freq: Every day | ORAL | Status: DC
  Administered 2017-04-15 – 2017-04-21 (×6): 30 mg via ORAL
  Filled 2017-04-15 (×7): qty 1

## 2017-04-15 MED ORDER — SODIUM CHLORIDE 0.9% FLUSH: 3.0000 mL | INTRAVENOUS | Status: DC | PRN

## 2017-04-15 MED ORDER — IOPAMIDOL (ISOVUE-370) INJECTION 76%
INTRAVENOUS | Status: AC
  Filled 2017-04-15: qty 100

## 2017-04-15 MED ORDER — MIDAZOLAM HCL 2 MG/2ML IJ SOLN
INTRAMUSCULAR | Status: AC
  Filled 2017-04-15: qty 2

## 2017-04-15 MED ORDER — LIDOCAINE HCL (PF) 1 % IJ SOLN
INTRAMUSCULAR | Status: DC | PRN
  Administered 2017-04-15: 2 mL via INTRADERMAL

## 2017-04-15 MED ORDER — ADENOSINE (DIAGNOSTIC) 140MCG/KG/MIN
INTRAVENOUS | Status: DC | PRN
  Administered 2017-04-15: 140 ug/kg/min via INTRAVENOUS

## 2017-04-15 MED ORDER — LABETALOL HCL 5 MG/ML IV SOLN
INTRAVENOUS | Status: DC | PRN
  Administered 2017-04-15: 10 mg via INTRAVENOUS

## 2017-04-15 MED ORDER — SODIUM CHLORIDE 0.9 % IV SOLN: 250.0000 mL | INTRAVENOUS | Status: DC | PRN

## 2017-04-15 MED ORDER — LABETALOL HCL 5 MG/ML IV SOLN
INTRAVENOUS | Status: AC
  Filled 2017-04-15: qty 4

## 2017-04-15 MED ORDER — HEPARIN SODIUM (PORCINE) 1000 UNIT/ML IJ SOLN
INTRAMUSCULAR | Status: AC
  Filled 2017-04-15: qty 1

## 2017-04-15 MED ORDER — HEPARIN (PORCINE) IN NACL 2-0.9 UNIT/ML-% IJ SOLN
INTRAMUSCULAR | Status: DC | PRN
Start: 2017-04-15 — End: 2017-04-15
  Administered 2017-04-15: 10 mL via INTRA_ARTERIAL

## 2017-04-15 MED ORDER — FENTANYL CITRATE (PF) 100 MCG/2ML IJ SOLN
INTRAMUSCULAR | Status: AC
  Filled 2017-04-15: qty 2

## 2017-04-15 MED ORDER — HEPARIN SODIUM (PORCINE) 1000 UNIT/ML IJ SOLN
INTRAMUSCULAR | Status: DC | PRN
  Administered 2017-04-15 (×2): 5000 [IU] via INTRAVENOUS

## 2017-04-15 MED ORDER — SODIUM CHLORIDE 0.9 % IV SOLN
INTRAVENOUS | Status: AC
  Administered 2017-04-15: 18:00:00 via INTRAVENOUS

## 2017-04-15 MED ORDER — FENTANYL CITRATE (PF) 100 MCG/2ML IJ SOLN
INTRAMUSCULAR | Status: DC | PRN
  Administered 2017-04-15: 50 ug via INTRAVENOUS

## 2017-04-15 MED ORDER — METOPROLOL TARTRATE 50 MG PO TABS
50.0000 mg | ORAL_TABLET | Freq: Two times a day (BID) | ORAL | Status: DC
  Administered 2017-04-15: 50 mg via ORAL
  Filled 2017-04-15 (×2): qty 1

## 2017-04-15 MED ORDER — LORAZEPAM 2 MG/ML IJ SOLN
2.0000 mg | INTRAMUSCULAR | Status: AC
  Administered 2017-04-15: 2 mg via INTRAVENOUS
  Filled 2017-04-15: qty 1

## 2017-04-15 MED ORDER — MAGNESIUM SULFATE 2 GM/50ML IV SOLN
2.0000 g | Freq: Once | INTRAVENOUS | Status: AC
  Administered 2017-04-15: 2 g via INTRAVENOUS
  Filled 2017-04-15: qty 50

## 2017-04-15 SURGICAL SUPPLY — 15 items
CATH EXPO 5F FL3.5 (CATHETERS) ×2 IMPLANT
CATH INFINITI JR4 5F (CATHETERS) ×2 IMPLANT
CATH VISTA GUIDE 6FR XBLAD3.5 (CATHETERS) ×2 IMPLANT
COVER PRB 48X5XTLSCP FOLD TPE (BAG) ×1 IMPLANT
COVER PROBE 5X48 (BAG) ×1
DEVICE RAD COMP TR BAND LRG (VASCULAR PRODUCTS) ×2 IMPLANT
GLIDESHEATH SLEND A-KIT 6F 22G (SHEATH) ×2 IMPLANT
GUIDEWIRE INQWIRE 1.5J.035X260 (WIRE) ×1 IMPLANT
GUIDEWIRE PRESSURE COMET II (WIRE) ×2 IMPLANT
INQWIRE 1.5J .035X260CM (WIRE) ×2
KIT ESSENTIALS PG (KITS) ×2 IMPLANT
KIT HEART LEFT (KITS) ×2 IMPLANT
PACK CARDIAC CATHETERIZATION (CUSTOM PROCEDURE TRAY) ×2 IMPLANT
TRANSDUCER W/STOPCOCK (MISCELLANEOUS) ×2 IMPLANT
TUBING CIL FLEX 10 FLL-RA (TUBING) ×2 IMPLANT

## 2017-04-15 NOTE — Consult Note (Signed)
[address]       [place] [address]             [phone]        Richard Calhoun [name] #[id number] Date of Birth: 08/08/1960  Referring: Linard Millers Primary Care: Alfonse Spruce, FNP  Chief Complaint:    Chief Complaint  Patient presents with  . Chest Pain   History of Present Illness:       [name] is a 56 yo obese white male with known history of Alcoholism, HTN, Hyperlipidemia, and Nicotine abuse.  He is a heavy drink with use of .5 to full bottle of rum daily.  He used to smoke 1.5 to 2 ppd, but currently using a vapor cigarette which he is unsure if it contains nicotine.  He states that he is complaint of his current medications.  Of note he is a poor historian.  He states that he presented to his family doctor for a regular follow up appointment.  EKG was obtained during that visit which showed the patient to be tachycardic.  He has suffered some syncopal episodes in the recent past as well.  He states they gave him a medication which made his symptoms better and he was referred to Cardiology for evaluation.  He was seen by Dr. Debara Pickett at which time EKG was normal.  He was instructed to continue his BB and ARB/Diuretic.  However there was concern about his heavy alcohol use and diuretic therapy.  He was instructed to return as needed.  He again states he was being seen by his family doctor for a blood pressure check.  EKG was again obtained.  Per notes documented there were noticeable EKG changes and EMS was contact for transportation to the ED.  On arrival cardiac enzymes were negative.  EKG was stable.  Patient was chest pain free on arrival to ED.  Upon questioning he admitted to experiencing chest pain over the weekend prior to admission.  The episodes occurred with exertion and resolved with rest.  He did have some mild shortness of breath during these episodes.  He was admitted and Cardiology consult was obtained.  It was felt that  with his angina cardiac catheterization would be indicated.  This was done and showed preserved EF with significant 2 vessel disease.  Coronary bypass consult was requested.  Currently the patient remains chest pain free.  He states that he can not stay in the hospital for 1-2 weeks and ultimately would like to go home.  He is unsure if he wants to proceed with surgery and is concerned regarding the cost as he has no insurance.  The patient is not active, he does walk places as he does not have regular transportation.  He has a noticeable resting tremor which he states he has had for a while.  He is very fidgety during evaluation.  Current Activity/ Functional Status: Patient is independent with mobility/ambulation, transfers, ADL's, IADL's.   Zubrod Score: At the time of surgery this patient's most appropriate activity status/level should be described as: []     0    Normal activity, no symptoms []     1    Restricted in physical strenuous activity but ambulatory, able to do out light work [x]     2    Ambulatory and capable of self care, unable to do work activities, up and about  more than 50%  Of the time                            []     3    Only limited self care, in bed greater than 50% of waking hours []     4    Completely disabled, no self care, confined to bed or chair []     5    Moribund  Past Medical History:  Diagnosis Date  . ALLERGIC RHINITIS   . ANXIETY   . BICEPS TENDON RUPTURE, RIGHT   . DEPENDENCE, ALCOHOL NEC/NOS, UNSPECIFIED   . DEPRESSION   . GERD   . HYPERLIPIDEMIA   . HYPERTENSION   . Impaired glucose tolerance   . OSTEOARTHRITIS   . Tubular adenoma of colon 06/2006    Past Surgical History:  Procedure Laterality Date  . FOOT SURGERY    . MANDIBLE FRACTURE SURGERY      History  Smoking Status  . Current Every Day Smoker  Smokeless Tobacco  . Never Used    Comment: states he is smoking 1 pack 1/2 now that he is unemployed     History    Alcohol Use  . 0.0 oz/week    Social History   Social History  . Marital status: Single    Spouse name: N/A  . Number of children: N/A  . Years of education: N/A   Occupational History  . unemployed    Social History Main Topics  . Smoking status: Current Every Day Smoker  . Smokeless tobacco: Never Used     Comment: states he is smoking 1 pack 1/2 now that he is unemployed   . Alcohol use 0.0 oz/week  . Drug use: No  . Sexual activity: Not on file   Other Topics Concern  . Not on file   Social History Narrative  . No narrative on file    Allergies  Allergen Reactions  . Penicillins Other (See Comments)    Childhood reaction Has patient had a PCN reaction causing immediate rash, facial/tongue/throat swelling, SOB or lightheadedness with hypotension: Unknown Has patient had a PCN reaction causing severe rash involving mucus membranes or skin necrosis: Unknown Has patient had a PCN reaction that required hospitalization: No Has patient had a PCN reaction occurring within the last 10 years: No If all of the above answers are "NO", then may proceed with Cephalosporin use.     Current Facility-Administered Medications  Medication Dose Route Frequency Provider Last Rate Last Dose  . 0.9 %  sodium chloride infusion   Intravenous Continuous Belva Crome, MD      . 0.9 %  sodium chloride infusion  250 mL Intravenous PRN Belva Crome, MD      . acetaminophen (TYLENOL) tablet 650 mg  650 mg Oral Q4H PRN Raiford Noble Latif, DO      . aspirin chewable tablet 81 mg  81 mg Oral Daily Belva Crome, MD      . aspirin EC tablet 325 mg  325 mg Oral Daily Raiford Noble [place], [place]   325 mg at 04/15/17 8657  . atorvastatin (LIPITOR) tablet 80 mg  80 mg Oral q1800 Belva Crome, MD      . enoxaparin (LOVENOX) injection 40 mg  40 mg Subcutaneous Daily Sheikh, Omair Latif, DO      . folic acid (FOLVITE) tablet 1 mg  1 mg Oral Daily Mondamin, Georgina Quint [place],  DO   1 mg at 04/15/17 0933   . isosorbide mononitrate (IMDUR) 24 hr tablet 30 mg  30 mg Oral Daily Belva Crome, MD      . LORazepam (ATIVAN) tablet 1 mg  1 mg Oral Q6H PRN Arrien, Jimmy Picket, MD   1 mg at 04/15/17 1642  . losartan (COZAAR) tablet 50 mg  50 mg Oral Daily Raiford Noble [place], DO   50 mg at 04/15/17 0932  . magnesium chloride (SLOW-MAG) 64 MG SR tablet 128 mg  2 tablet Oral BID Opyd, Ilene Qua, MD      . metoprolol tartrate (LOPRESSOR) tablet 50 mg  50 mg Oral BID Belva Crome, MD      . morphine 4 MG/ML injection 2 mg  2 mg Intravenous Q2H PRN Raiford Noble Latif, DO      . multivitamin with minerals tablet 1 tablet  1 tablet Oral Daily Raiford Noble [place], DO   1 tablet at 04/15/17 0931  . ondansetron (ZOFRAN) injection 4 mg  4 mg Intravenous Q6H PRN Sheikh, Omair Latif, DO      . pantoprazole (PROTONIX) EC tablet 40 mg  40 mg Oral Daily Raiford Noble [place], DO   40 mg at 04/15/17 0934  . sodium chloride flush (NS) 0.9 % injection 3 mL  3 mL Intravenous Q12H Belva Crome, MD      . sodium chloride flush (NS) 0.9 % injection 3 mL  3 mL Intravenous PRN Belva Crome, MD      . thiamine (VITAMIN B-1) tablet 100 mg  100 mg Oral Daily Raiford Noble [place], DO   100 mg at 04/15/17 7425   Or  . thiamine (B-1) injection 100 mg  100 mg Intravenous Daily Sheikh, Georgina Quint Latif, DO      . vitamin C (ASCORBIC ACID) tablet 500 mg  500 mg Oral Daily Raiford Noble [place], DO   500 mg at 04/15/17 9563    Prescriptions Prior to Admission  Medication Sig Dispense Refill Last Dose  . ibuprofen (ADVIL,MOTRIN) 200 MG tablet Take 800 mg by mouth every 6 (six) hours as needed for mild pain.   prn  . losartan (COZAAR) 50 MG tablet Take 1 tablet (50 mg total) by mouth daily. 30 tablet 2 04/14/2017 at Unknown time  . metoprolol tartrate (LOPRESSOR) 25 MG tablet Take 1 tablet (25 mg total) by mouth 2 (two) times daily. 60 tablet 2 04/14/2017 at 730am  . multivitamin-iron-minerals-folic acid (CENTRUM) chewable tablet Chew 1  tablet by mouth daily.   04/14/2017 at Unknown time  . pantoprazole (PROTONIX) 40 MG tablet Take 1 tablet (40 mg total) by mouth daily. 30 tablet 3 04/14/2017 at Unknown time  . simvastatin (ZOCOR) 40 MG tablet Take 1 tablet (40 mg total) by mouth at bedtime. 30 tablet 2 04/13/2017 at Unknown time  . vitamin C (ASCORBIC ACID) 500 MG tablet Take 500 mg by mouth daily.   04/14/2017 at Unknown time  . ibuprofen (ADVIL,MOTRIN) 600 MG tablet Take 1 tablet (600 mg total) by mouth every 8 (eight) hours as needed for moderate pain (Take with food). (Patient not taking: Reported on 04/14/2017) 30 tablet 0 Not Taking at Unknown time  . ketoconazole (NIZORAL) 2 % cream Apply 1 application topically daily. Apply to affected areas. (Patient not taking: Reported on 04/14/2017) 15 g 1 Not Taking at Unknown time  . triamcinolone (KENALOG) 0.025 % ointment Apply 1 application topically 2 (two) times daily. Apply to affected areas. (  Patient not taking: Reported on 04/14/2017) 30 g 0 Not Taking at Unknown time    Family History  Problem Relation Age of Onset  . Kidney disease Sister   . Stroke Mother   . Stroke Father    Review of Systems:  Pertinent items are noted in HPI.     Cardiac Review of Systems: Y or N  Chest Pain [ Y, resolved more than a week ago  ]  Resting SOB [ N  ] Exertional SOB  [Y  ]  Orthopnea [  ]   Pedal Edema [   ]    Palpitations [ Y ] Syncope  [Y  ]   Presyncope [   ]  General Review of Systems: [Y] = yes [  ]=no Constitional: recent weight change [  ]; anorexia [  ]; fatigue [  ]; nausea [ N ]; night sweats [  ]; fever [  ]; or chills [  ]                                                               Dental: poor dentition[N  ]; Last Dentist visit:   Eye : blurred vision [  ]; diplopia [   ]; vision changes [  ];  Amaurosis fugax[  ]; Resp: cough [  ];  Wheezing[Y at times  ];  hemoptysis[  ]; shortness of breath[  ]; paroxysmal nocturnal dyspnea[  ]; dyspnea on exertion[  ]; or orthopnea[   ];  GI:  gallstones[  ], vomiting[  ];  dysphagia[  ]; melena[  ];  hematochezia [  ]; heartburn[  ];   Hx of  Colonoscopy[  ]; GU: kidney stones [  ]; hematuria[  ];   dysuria [  ];  nocturia[  ];  history of     obstruction [  ]; urinary frequency [  ]             Skin: rash, swelling[  ];, hair loss[  ];  peripheral edema[  ];  or itching[  ]; Musculosketetal: myalgias[  ];  joint swelling[  ];  joint erythema[  ];  joint pain[  ];  back pain[  ];  Heme/Lymph: bruising[  ];  bleeding[  ];  anemia[  ];  Neuro: TIA[  ];  headaches[  ];  stroke[  ];  vertigo[  ];  seizures[  ];   paresthesias[ Y ];  difficulty walking[Y  ];  Resting Tremor  Psych:depression[  ]; anxiety[ Y ];  Endocrine: diabetes[  ];  thyroid dysfunction[  ];  Immunizations: Flu [  ]; Pneumococcal[  ];  Other: Alcoholism, .5-1 bottle of rum daily  Physical Exam: BP (!) 169/106   Pulse 94   Temp 98.7 F (37.1 C) (Oral)   Resp 16   Ht 5\' 7"  (1.702 m)   Wt 205 lb 12.8 oz (93.4 kg)   SpO2 96%   BMI 32.23 kg/m   General appearance: alert, cooperative and no distress Head: Normocephalic, without obvious abnormality, atraumatic Neck: no adenopathy, no carotid bruit, no JVD, supple, symmetrical, trachea midline and thyroid not enlarged, symmetric, no tenderness/mass/nodules Resp: clear to auscultation bilaterally Cardio: regular rate and rhythm GI: soft, non-tender; bowel sounds normal; no masses,  no organomegaly  Extremities: extremities normal, atraumatic, no cyanosis or edema and mild spider veins noted Neurologic: Grossly normal, resting tremor  Diagnostic Studies & Laboratory data:     Recent Radiology Findings:   Dg Chest 2 View  Result Date: 04/14/2017 CLINICAL DATA:  Left-sided chest pain and dizziness 5 days with left arm pain and tachycardia. EXAM: CHEST  2 VIEW COMPARISON:  None. FINDINGS: Lungs are adequately inflated and otherwise clear. Cardiomediastinal silhouette is within normal. Bones and soft  tissues are normal. IMPRESSION: No active cardiopulmonary disease. Electronically Signed   By: Marin Olp M.D.   On: 04/14/2017 13:36     I have independently reviewed the above radiologic studies.  Recent Lab Findings: Lab Results  Component Value Date   WBC 5.4 04/15/2017   HGB 11.8 (L) 04/15/2017   HCT 35.1 (L) 04/15/2017   PLT PLATELET CLUMPS NOTED ON SMEAR, UNABLE TO ESTIMATE 04/15/2017   GLUCOSE 91 04/15/2017   CHOL 189 04/15/2017   TRIG 141 04/15/2017   HDL 62 04/15/2017   LDLDIRECT 165 (H) 08/16/2013   LDLCALC 99 04/15/2017   ALT 35 08/29/2012   AST 35 08/29/2012   NA 138 04/15/2017   K 3.4 (L) 04/15/2017   CL 107 04/15/2017   CREATININE 0.78 04/15/2017   BUN 17 04/15/2017   CO2 22 04/15/2017   TSH 3.117 04/15/2017   INR 1.16 04/15/2017   HGBA1C 5.9 (H) 04/15/2017   Belva Crome, MD (Primary)    Procedures   INTRAVASCULAR PRESSURE WIRE/FFR STUDY  LEFT HEART CATH AND CORONARY ANGIOGRAPHY  Conclusion    Severe 2 vessel coronary disease with calcific total occlusion of the mid right coronary over a long segment.  Severe calcified coronary disease involving the proximal to mid LAD with 60-80% stenosis depending upon review and FFR positive at 0.74 beyond the second diagonal.  Luminal irregularities with less than 50% stenosis in the circumflex.  Normal left ventricular systolic function. EF greater than 55%. Mildly elevated LVEDP, 18 mmHg.  RECOMMENDATIONS:   Very difficult clinical situation. I believe the patient is alcohol dependent. He has no insurance. He is reluctant to have any care because of expense. According to his girlfriend he may not be reliable with chronic medication compliance.  HEART TEAM APPROACH; discuss CABG versus LAD atherectomy followed by stenting with medical therapy for right coronary. Chronic medical therapy without revascularization is probably not a good option due to the large region at risk should the LAD  close.  Aggressive blood pressure control and lipid management.  CIWA    Assessment / Plan:      1. CAD- 2V disease, question of CABG vs. PCI 2. HTN- uncontrolled, currently in the 160s on Cozaar at home 3. Tachycardia- on Lopressor 4. Hyperlipidemia- on Zocor 5. COPD-significant nicotine abuse, PFTS have been ordered 6. Alcoholism- heavy daily use, patient will likely withdrawal, currently on CIWA protocol..  7. Dispo- overall patient seems reluctant to stay in the hospital and is unsure of proceeding with bypass surgery.  He will be a high risk candidate if he is felt to be a candidate for surgery.  Will defer to Cardiology on discharging patient prior to proceeding with decided treatment plan...  Dr. Servando Snare is aware of patient and will follow up with further recomendations  I  spent 40 minutes counseling the patient face to face and 50% or more the  time was spent in counseling and coordination of care. The total time spent in the appointment was 55 minutes.  Junie Panning Barrett PA-C 04/15/2017 5:33 PM   Between last pm when [employer] PA saw the patient and my exam today , the patient is more confused on sedation to prevent severe agitation from  possible drug and or alcohol withdrawal. Currently he is not candidate for CABG. Consider angioplasty in near term. Please re consult after patient is mentally clear as an out patient and not drinking.    I have seen and examined Rudolpho Sevin and agree with the above assessment  and plan.  Grace Isaac MD Beeper [phone] Office [phone] 04/16/2017 1:09 PM

## 2017-04-15 NOTE — Progress Notes (Signed)
Progress Note  Patient Name: Richard Calhoun Date of Encounter: 04/15/2017  Primary Cardiologist: Bronson Ing  Subjective   Soundly asleep but awakened without difficulty. No chest discomfort during the night. Referred by Dr. Renaldo Reel for cath because of class III quality angina. Risk factors include hypertension, tobacco abuse, hyperlipidemia, and family history of vascular disease.  Inpatient Medications    Scheduled Meds: . aspirin EC  325 mg Oral Daily  . enoxaparin (LOVENOX) injection  40 mg Subcutaneous Daily  . folic acid  1 mg Oral Daily  . LORazepam  0-4 mg Intravenous Q6H   Followed by  . [START ON 04/17/2017] LORazepam  0-4 mg Intravenous Q12H  . losartan  50 mg Oral Daily  . magnesium chloride  2 tablet Oral BID  . metoprolol tartrate  25 mg Oral BID  . multivitamin with minerals  1 tablet Oral Daily  . pantoprazole  40 mg Oral Daily  . potassium chloride  40 mEq Oral BID  . simvastatin  40 mg Oral QHS  . sodium chloride flush  3 mL Intravenous Q12H  . thiamine  100 mg Oral Daily   Or  . thiamine  100 mg Intravenous Daily  . vitamin C  500 mg Oral Daily   Continuous Infusions: . sodium chloride    . sodium chloride 1 mL/kg/hr (04/15/17 0711)   PRN Meds: sodium chloride, acetaminophen, gi cocktail, LORazepam **OR** LORazepam, morphine injection, ondansetron (ZOFRAN) IV, sodium chloride flush   Vital Signs    Vitals:   04/15/17 0353 04/15/17 0423 04/15/17 0520 04/15/17 0600  BP: (!) 160/81 (!) 150/89 (!) 170/95 (!) 175/93  Pulse:   71 89  Resp:      Temp:   98 F (36.7 C)   TempSrc:   Oral   SpO2:   97%   Weight:      Height:        Intake/Output Summary (Last 24 hours) at 04/15/17 0835 Last data filed at 04/15/17 0439  Gross per 24 hour  Intake               50 ml  Output                0 ml  Net               50 ml   Filed Weights   04/14/17 1253 04/14/17 2300  Weight: 200 lb (90.7 kg) 205 lb 12.8 oz (93.4 kg)    Telemetry    Normal sinus  rhythm - Personally Reviewed  ECG    Poor R-wave progression V1 through V4. Sinus rhythm. No acute abnormality. - Personally Reviewed  Physical Exam  Snoring loudly upon entering the room. Obese. GEN: No acute distress.   Neck: No JVD Cardiac: RRR, no murmurs, rubs, or gallops.  Respiratory: Clear to auscultation bilaterally. GI: Soft, nontender, non-distended  MS: No edema; No deformity. Neuro:  Nonfocal  Psych: Normal affect   Labs    Chemistry Recent Labs Lab 04/14/17 1300 04/14/17 1657 04/14/17 2305 04/15/17 0554  NA 142 144  --  138  K 3.4* 3.4*  --  3.4*  CL 111 111  --  107  CO2 23  --   --  22  GLUCOSE 103* 88  --  91  BUN 19 20  --  17  CREATININE 0.85 0.70 0.90 0.78  CALCIUM 9.5  --   --  9.1  GFRNONAA >60  --  >60 >60  GFRAA >60  --  >60 >60  ANIONGAP 8  --   --  9     Hematology Recent Labs Lab 04/14/17 1300 04/14/17 1657 04/14/17 2305 04/15/17 0554  WBC 5.3  --  5.3 5.4  RBC 3.48*  --  3.19* 3.29*  HGB 12.5* 11.6* 11.7* 11.8*  HCT 37.0* 34.0* 33.7* 35.1*  MCV 106.3*  --  105.6* 106.7*  MCH 35.9*  --  36.7* 35.9*  MCHC 33.8  --  34.7 33.6  RDW 13.8  --  13.6 13.2  PLT PLATELET CLUMPS NOTED ON SMEAR, UNABLE TO ESTIMATE  --  PLATELET CLUMPS NOTED ON SMEAR, UNABLE TO ESTIMATE PENDING    Cardiac Enzymes Recent Labs Lab 04/14/17 2305 04/15/17 0234 04/15/17 0540  TROPONINI <0.03 <0.03 <0.03    Recent Labs Lab 04/14/17 1313  TROPIPOC 0.00     BNPNo results for input(s): BNP, PROBNP in the last 168 hours.   DDimer No results for input(s): DDIMER in the last 168 hours.   Radiology    Dg Chest 2 View  Result Date: 04/14/2017 CLINICAL DATA:  Left-sided chest pain and dizziness 5 days with left arm pain and tachycardia. EXAM: CHEST  2 VIEW COMPARISON:  None. FINDINGS: Lungs are adequately inflated and otherwise clear. Cardiomediastinal silhouette is within normal. Bones and soft tissues are normal. IMPRESSION: No active cardiopulmonary  disease. Electronically Signed   By: Marin Olp M.D.   On: 04/14/2017 13:36    Cardiac Studies   Normal LV function by echo July 2018.  Patient Profile     56 y.o. male with 5 day history of exertional chest discomfort consistent with angina pectoris. Risk factors include hyperlipidemia, male gender, hypertension, tobacco use, and family history of vascular disease. Cath is planned later today.  Assessment & Plan     1. Angina pectoris, class III in patient with multiple risk factors. Procedure rediscussed. Scheduled for late this afternoon and therefore having clear liquid breakfast.  For questions or updates, please contact [place] Please consult [email or web address] for contact info under Cardiology/STEMI.      Signed, Sinclair Grooms, MD  04/15/2017, 8:35 AM

## 2017-04-15 NOTE — Plan of Care (Signed)
Problem: Safety: Goal: Ability to remain free from injury will improve Outcome: Progressing Patient has remained free from injury. Bed alarm maintained. Call light within reach. Urinal at bedside.   Problem: Physical Regulation: Goal: Ability to maintain clinical measurements within normal limits will improve Outcome: Progressing Blood pressure trending down. CIWA assessed, additional Ativan done received last night. Cardiac cath today. Critical Magnesium resulted, [facility] paged and order received to transfuse 2g of Mag.

## 2017-04-15 NOTE — Progress Notes (Signed)
Pt back from cath lab around 1625, pt restless and having tremors. Pt given ativan 1mg  po. Pt sitting up on side of bed at times, pt been educated on keeping R arm/wrist elevated at or above heart on pillows provided. Pt keeps moving arm/wrist some, compliant some. Pt reminded of bleeding risk, states understanding. Releasing air from TR band slowly to keep pt from bleeding.

## 2017-04-15 NOTE — Progress Notes (Signed)
PROGRESS NOTE    Richard Calhoun  XTG:[id number] DOB: 1961/01/05 DOA: 04/14/2017 PCP: Alfonse Spruce, FNP    Brief Narrative:  56 year old male who presented with chest pain. Patient does have a significant past medical history for hypertension, dyslipidemia, alcohol abuse, and GERD. About 2 weeks ago he was placed on a new antihypertensive agent, metoprolol, with improvement of his dizziness. 48 hours ago he developed chest pain, precordial, dual in nature, associated with diaphoresis, palpitations and dyspnea, occurred while walking and improved by sitting. He normally does not suffer from exertional chest pain. He was seen by his primary care physician who send him to the hospital for further cardiac evaluation. Recently he was found to have a heart rate 155, while symptomatic, he was advised to continue taking metoprolol. On initial physical examination blood pressure 173/92, heart 81, respiratory 22, oxygen saturation 95%, moist mucous membranes, lungs with diminished breath sounds bilaterally, mild rales, heart S1-S2 present rhythmic, no gallops, rubs or murmurs, abdomen soft nontender, no lower extremity edema. Sodium 138, potassium 3.4, bicarbonate 22, chloride 107, white count 5.3, hemoglobin 11.7, hematocrit 33.7, platelets clumped. INR 1.1 Chest x-ray, hypoinflated, no infiltrates, effusions or pneumothorax.EKG normal sinus rhythm, no ST elevations, no ST depressions, no significant T wave abnormalities, positive poor R-wave progression on the precordial leads.  Patient admitted to hospital working diagnosis of chest pain, rule out unstable angina/ acute coronary syndrome  Assessment & Plan:   Active Problems:   Hyperlipidemia   EtOH dependence ([place])   Essential hypertension   GERD   Hypertensive heart disease without heart failure   Chest pain   Hypokalemia   Tobacco abuse   1. Chest pain rule out unstable angina/ acute coronary syndrome. Patient with no further chest pain or  palpitations, will continue telemetry monitoring, questionable tachycardia/ supraventricular tachycardia triggering symptoms. Will continue metoprolol, scheduled for cardiac catheterization and coronary angiography today. Continue aspirin.   2. Hypertension. Will continue blood pressure control with losartan and metoprolol  3. Dyslipidemia. Continue statin  4. History of alcohol abuse. No signs of active withdrawal, will continue with lorazepam as needed.    DVT prophylaxis: scd  Code Status: full Family Communication: no family at the bedside Disposition Plan: home   Consultants:   cardiology  Procedures:     Antimicrobials:       Subjective: Patient currently chest pain free, no nausea or vomiting, no dyspnea, no palpitations.   Objective: Vitals:   04/15/17 0423 04/15/17 0520 04/15/17 0600 04/15/17 1152  BP: (!) 150/89 (!) 170/95 (!) 175/93 (!) 177/99  Pulse:  71 89 79  Resp:    20  Temp:  98 F (36.7 C)  98.1 F (36.7 C)  TempSrc:  Oral    SpO2:  97%  99%  Weight:      Height:        Intake/Output Summary (Last 24 hours) at 04/15/17 1249 Last data filed at 04/15/17 0940  Gross per 24 hour  Intake              410 ml  Output                0 ml  Net              410 ml   Filed Weights   04/14/17 1253 04/14/17 2300  Weight: 90.7 kg (200 lb) 93.4 kg (205 lb 12.8 oz)    Examination:  General: Not in pain or dyspnea Neurology: Awake and alert,  non focal  E ENT: no pallor, no icterus, oral mucosa moist Cardiovascular: No JVD. S1-S2 present, rhythmic, no gallops, rubs, or murmurs. No lower extremity edema. Pulmonary: vesicular breath sounds bilaterally, adequate air movement, no wheezing, rhonchi or rales. Gastrointestinal. Abdomen fprotuberant, no organomegaly, non tender, no rebound or guarding Skin. No rashes Musculoskeletal: no joint deformities     Data Reviewed: I have personally reviewed following labs and imaging studies  CBC:  Recent  Labs Lab 04/14/17 1300 04/14/17 1657 04/14/17 2305 04/15/17 0554  WBC 5.3  --  5.3 5.4  HGB 12.5* 11.6* 11.7* 11.8*  HCT 37.0* 34.0* 33.7* 35.1*  MCV 106.3*  --  105.6* 106.7*  PLT PLATELET CLUMPS NOTED ON SMEAR, UNABLE TO ESTIMATE  --  PLATELET CLUMPS NOTED ON SMEAR, UNABLE TO ESTIMATE PLATELET CLUMPS NOTED ON SMEAR, UNABLE TO ESTIMATE   Basic Metabolic Panel:  Recent Labs Lab 04/14/17 1300 04/14/17 1657 04/14/17 2305 04/15/17 0234 04/15/17 0554  NA 142 144  --   --  138  K 3.4* 3.4*  --   --  3.4*  CL 111 111  --   --  107  CO2 23  --   --   --  22  GLUCOSE 103* 88  --   --  91  BUN 19 20  --   --  17  CREATININE 0.85 0.70 0.90  --  0.78  CALCIUM 9.5  --   --   --  9.1  MG  --   --   --  0.9*  --   PHOS  --   --   --  2.7  --    GFR: Estimated Creatinine Clearance: 109.6 mL/min (by C-G formula based on SCr of 0.78 mg/dL). Liver Function Tests: No results for input(s): AST, ALT, ALKPHOS, BILITOT, PROT, ALBUMIN in the last 168 hours. No results for input(s): LIPASE, AMYLASE in the last 168 hours. No results for input(s): AMMONIA in the last 168 hours. Coagulation Profile:  Recent Labs Lab 04/15/17 0234  INR 1.16   Cardiac Enzymes:  Recent Labs Lab 04/14/17 2305 04/15/17 0234 04/15/17 0540  TROPONINI <0.03 <0.03 <0.03   BNP (last 3 results) No results for input(s): PROBNP in the last 8760 hours. HbA1C:  Recent Labs  04/15/17 0234  HGBA1C 5.9*   CBG: No results for input(s): GLUCAP in the last 168 hours. Lipid Profile:  Recent Labs  04/15/17 0234  CHOL 189  HDL 62  LDLCALC 99  TRIG 141  CHOLHDL 3.0   Thyroid Function Tests:  Recent Labs  04/15/17 0234  TSH 3.117   Anemia Panel: No results for input(s): VITAMINB12, FOLATE, FERRITIN, TIBC, IRON, RETICCTPCT in the last 72 hours.    Radiology Studies: I have reviewed all of the imaging during this hospital visit personally     Scheduled Meds: . aspirin EC  325 mg Oral Daily  .  enoxaparin (LOVENOX) injection  40 mg Subcutaneous Daily  . folic acid  1 mg Oral Daily  . losartan  50 mg Oral Daily  . magnesium chloride  2 tablet Oral BID  . metoprolol tartrate  25 mg Oral BID  . multivitamin with minerals  1 tablet Oral Daily  . pantoprazole  40 mg Oral Daily  . simvastatin  40 mg Oral QHS  . sodium chloride flush  3 mL Intravenous Q12H  . thiamine  100 mg Oral Daily   Or  . thiamine  100 mg Intravenous Daily  . vitamin  C  500 mg Oral Daily   Continuous Infusions: . sodium chloride    . sodium chloride 1 mL/kg/hr (04/15/17 0711)     LOS: 0 days        Tawni Millers, MD [facility] Pager [phone]

## 2017-04-15 NOTE — Interval H&P Note (Signed)
Cath Lab Visit (complete for each Cath Lab visit)  Clinical Evaluation Leading to the Procedure:   ACS: Yes.    Non-ACS:    Anginal Classification: CCS III  Anti-ischemic medical therapy: Minimal Therapy (1 class of medications)  Non-Invasive Test Results: No non-invasive testing performed  Prior CABG: No previous CABG      History and Physical Interval Note:  04/15/2017 2:49 PM  Richard Calhoun  has presented today for surgery, with the diagnosis of unstable angina  The various methods of treatment have been discussed with the patient and family. After consideration of risks, benefits and other options for treatment, the patient has consented to  Procedure(s): LEFT HEART CATH AND CORONARY ANGIOGRAPHY (N/A) as a surgical intervention .  The patient's history has been reviewed, patient examined, no change in status, stable for surgery.  I have reviewed the patient's chart and labs.  Questions were answered to the patient's satisfaction.     Belva Crome III

## 2017-04-15 NOTE — H&P (View-Only) (Signed)
Progress Note  Patient Name: Richard Calhoun Date of Encounter: 04/15/2017  Primary Cardiologist: Bronson Ing  Subjective   Soundly asleep but awakened without difficulty. No chest discomfort during the night. Referred by Dr. Renaldo Reel for cath because of class III quality angina. Risk factors include hypertension, tobacco abuse, hyperlipidemia, and family history of vascular disease.  Inpatient Medications    Scheduled Meds: . aspirin EC  325 mg Oral Daily  . enoxaparin (LOVENOX) injection  40 mg Subcutaneous Daily  . folic acid  1 mg Oral Daily  . LORazepam  0-4 mg Intravenous Q6H   Followed by  . [START ON 04/17/2017] LORazepam  0-4 mg Intravenous Q12H  . losartan  50 mg Oral Daily  . magnesium chloride  2 tablet Oral BID  . metoprolol tartrate  25 mg Oral BID  . multivitamin with minerals  1 tablet Oral Daily  . pantoprazole  40 mg Oral Daily  . potassium chloride  40 mEq Oral BID  . simvastatin  40 mg Oral QHS  . sodium chloride flush  3 mL Intravenous Q12H  . thiamine  100 mg Oral Daily   Or  . thiamine  100 mg Intravenous Daily  . vitamin C  500 mg Oral Daily   Continuous Infusions: . sodium chloride    . sodium chloride 1 mL/kg/hr (04/15/17 0711)   PRN Meds: sodium chloride, acetaminophen, gi cocktail, LORazepam **OR** LORazepam, morphine injection, ondansetron (ZOFRAN) IV, sodium chloride flush   Vital Signs    Vitals:   04/15/17 0353 04/15/17 0423 04/15/17 0520 04/15/17 0600  BP: (!) 160/81 (!) 150/89 (!) 170/95 (!) 175/93  Pulse:   71 89  Resp:      Temp:   98 F (36.7 C)   TempSrc:   Oral   SpO2:   97%   Weight:      Height:        Intake/Output Summary (Last 24 hours) at 04/15/17 0835 Last data filed at 04/15/17 0439  Gross per 24 hour  Intake               50 ml  Output                0 ml  Net               50 ml   Filed Weights   04/14/17 1253 04/14/17 2300  Weight: 200 lb (90.7 kg) 205 lb 12.8 oz (93.4 kg)    Telemetry    Normal sinus  rhythm - Personally Reviewed  ECG    Poor R-wave progression V1 through V4. Sinus rhythm. No acute abnormality. - Personally Reviewed  Physical Exam  Snoring loudly upon entering the room. Obese. GEN: No acute distress.   Neck: No JVD Cardiac: RRR, no murmurs, rubs, or gallops.  Respiratory: Clear to auscultation bilaterally. GI: Soft, nontender, non-distended  MS: No edema; No deformity. Neuro:  Nonfocal  Psych: Normal affect   Labs    Chemistry Recent Labs Lab 04/14/17 1300 04/14/17 1657 04/14/17 2305 04/15/17 0554  NA 142 144  --  138  K 3.4* 3.4*  --  3.4*  CL 111 111  --  107  CO2 23  --   --  22  GLUCOSE 103* 88  --  91  BUN 19 20  --  17  CREATININE 0.85 0.70 0.90 0.78  CALCIUM 9.5  --   --  9.1  GFRNONAA >60  --  >60 >60  GFRAA >60  --  >60 >60  ANIONGAP 8  --   --  9     Hematology Recent Labs Lab 04/14/17 1300 04/14/17 1657 04/14/17 2305 04/15/17 0554  WBC 5.3  --  5.3 5.4  RBC 3.48*  --  3.19* 3.29*  HGB 12.5* 11.6* 11.7* 11.8*  HCT 37.0* 34.0* 33.7* 35.1*  MCV 106.3*  --  105.6* 106.7*  MCH 35.9*  --  36.7* 35.9*  MCHC 33.8  --  34.7 33.6  RDW 13.8  --  13.6 13.2  PLT PLATELET CLUMPS NOTED ON SMEAR, UNABLE TO ESTIMATE  --  PLATELET CLUMPS NOTED ON SMEAR, UNABLE TO ESTIMATE PENDING    Cardiac Enzymes Recent Labs Lab 04/14/17 2305 04/15/17 0234 04/15/17 0540  TROPONINI <0.03 <0.03 <0.03    Recent Labs Lab 04/14/17 1313  TROPIPOC 0.00     BNPNo results for input(s): BNP, PROBNP in the last 168 hours.   DDimer No results for input(s): DDIMER in the last 168 hours.   Radiology    Dg Chest 2 View  Result Date: 04/14/2017 CLINICAL DATA:  Left-sided chest pain and dizziness 5 days with left arm pain and tachycardia. EXAM: CHEST  2 VIEW COMPARISON:  None. FINDINGS: Lungs are adequately inflated and otherwise clear. Cardiomediastinal silhouette is within normal. Bones and soft tissues are normal. IMPRESSION: No active cardiopulmonary  disease. Electronically Signed   By: Marin Olp M.D.   On: 04/14/2017 13:36    Cardiac Studies   Normal LV function by echo July 2018.  Patient Profile     56 y.o. male with 5 day history of exertional chest discomfort consistent with angina pectoris. Risk factors include hyperlipidemia, male gender, hypertension, tobacco use, and family history of vascular disease. Cath is planned later today.  Assessment & Plan     1. Angina pectoris, class III in patient with multiple risk factors. Procedure rediscussed. Scheduled for late this afternoon and therefore having clear liquid breakfast.  For questions or updates, please contact [place] Please consult [email or web address] for contact info under Cardiology/STEMI.      Signed, Sinclair Grooms, MD  04/15/2017, 8:35 AM

## 2017-04-16 ENCOUNTER — Other Ambulatory Visit (HOSPITAL_COMMUNITY): Payer: Self-pay

## 2017-04-16 DIAGNOSIS — R079 Chest pain, unspecified: Secondary | ICD-10-CM

## 2017-04-16 DIAGNOSIS — F10931 Alcohol use, unspecified with withdrawal delirium: Secondary | ICD-10-CM

## 2017-04-16 DIAGNOSIS — F10231 Alcohol dependence with withdrawal delirium: Secondary | ICD-10-CM

## 2017-04-16 LAB — COMPREHENSIVE METABOLIC PANEL
ALBUMIN: 3.5 g/dL (ref 3.5–5.0)
ALT: 35 U/L (ref 17–63)
AST: 46 U/L — AB (ref 15–41)
Alkaline Phosphatase: 88 U/L (ref 38–126)
Anion gap: 9 (ref 5–15)
BILIRUBIN TOTAL: 0.9 mg/dL (ref 0.3–1.2)
BUN: 14 mg/dL (ref 6–20)
CHLORIDE: 106 mmol/L (ref 101–111)
CO2: 21 mmol/L — ABNORMAL LOW (ref 22–32)
Calcium: 8.7 mg/dL — ABNORMAL LOW (ref 8.9–10.3)
Creatinine, Ser: 0.72 mg/dL (ref 0.61–1.24)
GFR calc Af Amer: >60 mL/min (ref >60)
GFR calc non Af Amer: >60 mL/min (ref >60)
GLUCOSE: 115 mg/dL — AB (ref 65–99)
POTASSIUM: 3.5 mmol/L (ref 3.5–5.1)
Sodium: 136 mmol/L (ref 135–145)
TOTAL PROTEIN: 6.7 g/dL (ref 6.5–8.1)

## 2017-04-16 LAB — GLUCOSE, CAPILLARY
GLUCOSE-CAPILLARY: 122 mg/dL — AB (ref 65–99)
GLUCOSE-CAPILLARY: 106 mg/dL — AB (ref 65–99)
Glucose-Capillary: 116 mg/dL — ABNORMAL HIGH (ref 65–99)
Glucose-Capillary: 122 mg/dL — ABNORMAL HIGH (ref 65–99)
Glucose-Capillary: 118 mg/dL — ABNORMAL HIGH (ref 65–99)

## 2017-04-16 LAB — MRSA PCR SCREENING: MRSA by PCR: NEGATIVE

## 2017-04-16 LAB — MAGNESIUM: Magnesium: 1.2 mg/dL — ABNORMAL LOW (ref 1.7–2.4)

## 2017-04-16 MED ORDER — LORAZEPAM 2 MG/ML IJ SOLN
1.0000 mg | INTRAMUSCULAR | Status: DC | PRN
  Administered 2017-04-16 – 2017-04-17 (×5): 2 mg via INTRAVENOUS
  Filled 2017-04-16 (×6): qty 1

## 2017-04-16 MED ORDER — MIDAZOLAM HCL 2 MG/2ML IJ SOLN
1.0000 mg | INTRAMUSCULAR | Status: DC | PRN
  Administered 2017-04-16: 2 mg via INTRAVENOUS
  Filled 2017-04-16: qty 2

## 2017-04-16 MED ORDER — DEXMEDETOMIDINE HCL 200 MCG/2ML IV SOLN: 0.2000 ug/kg/h | INTRAVENOUS | Status: DC

## 2017-04-16 MED ORDER — INSULIN ASPART 100 UNIT/ML ~~LOC~~ SOLN
2.0000 [IU] | SUBCUTANEOUS | Status: DC
  Administered 2017-04-16 – 2017-04-18 (×5): 2 [IU] via SUBCUTANEOUS
  Administered 2017-04-18: 4 [IU] via SUBCUTANEOUS
  Administered 2017-04-18 – 2017-04-19 (×3): 2 [IU] via SUBCUTANEOUS
  Administered 2017-04-19: 4 [IU] via SUBCUTANEOUS

## 2017-04-16 MED ORDER — MAGNESIUM SULFATE 2 GM/50ML IV SOLN
2.0000 g | Freq: Once | INTRAVENOUS | Status: AC
  Administered 2017-04-16: 2 g via INTRAVENOUS
  Filled 2017-04-16: qty 50

## 2017-04-16 MED ORDER — DEXMEDETOMIDINE HCL IN NACL 400 MCG/100ML IV SOLN
0.4000 ug/kg/h | INTRAVENOUS | Status: DC
  Administered 2017-04-16: 0.2 ug/kg/h via INTRAVENOUS
  Administered 2017-04-16: 0.6 ug/kg/h via INTRAVENOUS
  Administered 2017-04-16: 0.4 ug/kg/h via INTRAVENOUS
  Administered 2017-04-17: 1 ug/kg/h via INTRAVENOUS
  Administered 2017-04-17: 0.6 ug/kg/h via INTRAVENOUS
  Filled 2017-04-16 (×4): qty 100

## 2017-04-16 MED ORDER — PANTOPRAZOLE SODIUM 40 MG IV SOLR
40.0000 mg | INTRAVENOUS | Status: DC
  Administered 2017-04-16 – 2017-04-17 (×2): 40 mg via INTRAVENOUS
  Filled 2017-04-16 (×3): qty 40

## 2017-04-16 MED ORDER — POTASSIUM CHLORIDE 10 MEQ/50ML IV SOLN
10.0000 meq | INTRAVENOUS | Status: DC
  Filled 2017-04-16: qty 50

## 2017-04-16 MED ORDER — LORAZEPAM 2 MG/ML IJ SOLN
2.0000 mg | INTRAMUSCULAR | Status: DC | PRN
  Administered 2017-04-16: 2 mg via INTRAVENOUS
  Administered 2017-04-16 (×2): 3 mg via INTRAVENOUS
  Filled 2017-04-16: qty 1
  Filled 2017-04-16 (×2): qty 2
  Filled 2017-04-16: qty 1

## 2017-04-16 MED ORDER — HALOPERIDOL LACTATE 5 MG/ML IJ SOLN
5.0000 mg | Freq: Once | INTRAMUSCULAR | Status: AC
  Administered 2017-04-16: 5 mg via INTRAVENOUS
  Filled 2017-04-16: qty 1

## 2017-04-16 MED ORDER — SODIUM PHOSPHATES 45 MMOLE/15ML IV SOLN
10.0000 mmol | Freq: Once | INTRAVENOUS | Status: AC
  Administered 2017-04-16: 10 mmol via INTRAVENOUS
  Filled 2017-04-16: qty 3.33

## 2017-04-16 MED ORDER — LORAZEPAM 2 MG/ML IJ SOLN: 4.0000 mg | Freq: Once | INTRAMUSCULAR | Status: DC

## 2017-04-16 MED ORDER — POTASSIUM CHLORIDE 10 MEQ/100ML IV SOLN
10.0000 meq | INTRAVENOUS | Status: AC
  Administered 2017-04-16 (×2): 10 meq via INTRAVENOUS
  Filled 2017-04-16 (×2): qty 100

## 2017-04-16 MED ORDER — THIAMINE HCL 100 MG/ML IJ SOLN
Freq: Once | INTRAVENOUS | Status: AC
  Administered 2017-04-16: 06:00:00 via INTRAVENOUS
  Filled 2017-04-16: qty 1000

## 2017-04-16 MED ORDER — LORAZEPAM 2 MG/ML IJ SOLN
2.0000 mg | Freq: Once | INTRAMUSCULAR | Status: AC
  Administered 2017-04-16: 2 mg via INTRAVENOUS
  Filled 2017-04-16: qty 1

## 2017-04-16 MED ORDER — METOPROLOL TARTRATE 5 MG/5ML IV SOLN
5.0000 mg | Freq: Four times a day (QID) | INTRAVENOUS | Status: DC
  Administered 2017-04-16 – 2017-04-18 (×8): 5 mg via INTRAVENOUS
  Filled 2017-04-16 (×9): qty 5

## 2017-04-16 MED ORDER — LORAZEPAM 2 MG/ML IJ SOLN
2.0000 mg | Freq: Once | INTRAMUSCULAR | Status: AC
  Administered 2017-04-16: 2 mg via INTRAMUSCULAR

## 2017-04-16 NOTE — Progress Notes (Signed)
[facility] Progress Note Patient Name: Richard Calhoun DOB: [birth year] MRN: [id number]   Date of Service  04/16/2017  HPI/Events of Note  Agitation as precedex is being tapered  eICU Interventions  Ativan per CIWA protocol     Intervention Category Evaluation Type: Other  Richard Calhoun 04/16/2017, 5:23 PM

## 2017-04-16 NOTE — Progress Notes (Signed)
Rudolpho Sevin sedated with Precedex and going through EtOH withdrawal. Aloma Boch discuss coronary options when no longer withdrawing from alcohol.  Cambren Helm Curt Bears, MD 04/16/2017 1:39 PM

## 2017-04-16 NOTE — Progress Notes (Signed)
Patient was transferred to intensive care unit due to severe withdrawal syndrome, patient has been placed on a Precedex infusion. Will continue to follow patient once he is back on the medical ward.

## 2017-04-16 NOTE — Progress Notes (Signed)
At beginning of shift patient oriented to situation, place time and person. Some noted tremors and anxiety. Throughout shift patient became increasingly confused, restless and agitated. 1mg  PO Ativan given at 2240, no relief noted. CIWA scored at 0000, 17. [facility] p[facility] and CIWA protocol reordered. 2 mg IVP Ativan given at 0013. At [place], patient attempting to get out of bed, confused to place/situation and became aggressive. CIWA rescored at 49. Order received from [facility] covering to give 3 mg IVP Ativan early. Patient refusing all treatment. With assistance from security, patient assisted back in bed. Patient allowed Ativan to be given. Order received for additional 2 mg of Ativan IVP if symptoms continue.

## 2017-04-16 NOTE — Progress Notes (Signed)
Patient to be transferred to [facility] ICU, report called to accepting RN. 2nd IV established, Left FA 20g. IV fluids transported with patient.

## 2017-04-16 NOTE — Consult Note (Addendum)
Name: Richard Calhoun MRN: [id number] DOB: May 20, 1961    ADMISSION DATE:  04/14/2017 CONSULTATION DATE: 04/16/17 REFERRING MD : Cathlean Sauer MD  CHIEF COMPLAINT:  Agitated h/o ETOH abuse  BRIEF PATIENT DESCRIPTION: 56 yr old male with PMHx of HTN, CAD, HLD, recently admitted for unstable angina found to have severe 2 vessel disease on 04/15/17 Cardiac cath and was being considered for CABG vs PCI.  His CIWA protocol was held during the day. This evening agitated and increasingly altered HTN Tachycardic and diaphoretic despite 13 mg Ativan, and Haldol. Acute ETOH withdrawal.  SIGNIFICANT EVENTS  Agitated, altered mental status not responding to prn meds  STUDIES:  S/p cardiac cath- 2 vessel disease- 04/15/17   HISTORY OF PRESENT ILLNESS: 56 yr old male with PMHx of HTN, CAD, HLD, recently admitted for unstable angina found to have severe 2 vessel disease on Cardiac cath and was being considered for CABG vs PCI.  His CIWA protocol was held during the day. This evening agitated and increasingly altered HTN Tachycardic and diaphoretic. Nurse sitting at bedaside patient in 4 point restraints.  Despite 13 mg Ativan, and Haldol pt continues to be combative, verbally and physically abusive. Diaphoretic and hypertensive. Acute ETOH withdrawal.  PAST MEDICAL HISTORY :   has a past medical history of ALLERGIC RHINITIS; ANXIETY; BICEPS TENDON RUPTURE, RIGHT; DEPENDENCE, ALCOHOL NEC/NOS, UNSPECIFIED; DEPRESSION; GERD; HYPERLIPIDEMIA; HYPERTENSION; Impaired glucose tolerance; OSTEOARTHRITIS; and Tubular adenoma of colon (06/2006).  has a past surgical history that includes Foot surgery; Mandible fracture surgery; LEFT HEART CATH AND CORONARY ANGIOGRAPHY (N/A, 04/15/2017); and INTRAVASCULAR PRESSURE WIRE/FFR STUDY (N/A, 04/15/2017). Prior to Admission medications   Medication Sig Start Date End Date Taking? Authorizing Provider  ibuprofen (ADVIL,MOTRIN) 200 MG tablet Take 800 mg by mouth every 6 (six) hours as  needed for mild pain.   Yes [provider]  losartan (COZAAR) 50 MG tablet Take 1 tablet (50 mg total) by mouth daily. 03/25/17  Yes Hairston, Maylon Peppers, FNP  metoprolol tartrate (LOPRESSOR) 25 MG tablet Take 1 tablet (25 mg total) by mouth 2 (two) times daily. 03/25/17  Yes Hairston, Maylon Peppers, FNP  multivitamin-iron-minerals-folic acid (CENTRUM) chewable tablet Chew 1 tablet by mouth daily.   Yes [provider]  pantoprazole (PROTONIX) 40 MG tablet Take 1 tablet (40 mg total) by mouth daily. 03/25/17  Yes Hairston, Maylon Peppers, FNP  simvastatin (ZOCOR) 40 MG tablet Take 1 tablet (40 mg total) by mouth at bedtime. 03/25/17  Yes Hairston, Toy Baker R, FNP  vitamin C (ASCORBIC ACID) 500 MG tablet Take 500 mg by mouth daily.   Yes [provider]  ibuprofen (ADVIL,MOTRIN) 600 MG tablet Take 1 tablet (600 mg total) by mouth every 8 (eight) hours as needed for moderate pain (Take with food). Patient not taking: Reported on 04/14/2017 12/23/16   Alfonse Spruce, FNP  ketoconazole (NIZORAL) 2 % cream Apply 1 application topically daily. Apply to affected areas. Patient not taking: Reported on 04/14/2017 01/17/17   Alfonse Spruce, FNP  triamcinolone (KENALOG) 0.025 % ointment Apply 1 application topically 2 (two) times daily. Apply to affected areas. Patient not taking: Reported on 04/14/2017 01/17/17   Alfonse Spruce, FNP   Allergies  Allergen Reactions  . Penicillins Other (See Comments)    Childhood reaction Has patient had a PCN reaction causing immediate rash, facial/tongue/throat swelling, SOB or lightheadedness with hypotension: Unknown Has patient had a PCN reaction causing severe rash involving mucus membranes or skin necrosis: Unknown Has patient had  a PCN reaction that required hospitalization: No Has patient had a PCN reaction occurring within the last 10 years: No If all of the above answers are "NO", then may proceed with Cephalosporin use.     FAMILY  HISTORY:  family history includes Kidney disease in his sister; Stroke in his father and mother. SOCIAL HISTORY:  reports that he has been smoking.  He has never used smokeless tobacco. He reports that he drinks alcohol. He reports that he does not use drugs.  REVIEW OF SYSTEMS:  Bolded items are pertinent positives Constitutional: Negative for fever, chills, weight loss, malaise/fatigue and diaphoresis.  HENT: Negative for hearing loss, ear pain, nosebleeds, congestion, sore throat, neck pain, tinnitus and ear discharge.tongue fasciculations   Eyes: Negative for blurred vision, double vision, photophobia, pain, discharge and redness.  Respiratory: Negative for cough, hemoptysis, sputum production, shortness of breath, wheezing and stridor.   Cardiovascular: Negative for chest pain, palpitations, orthopnea, claudication, leg swelling and PND.  Gastrointestinal: Negative for heartburn, nausea, vomiting, abdominal pain, diarrhea, constipation, blood in stool and melena.  Genitourinary: Negative for dysuria, urgency, frequency, hematuria and flank pain.  Musculoskeletal: Negative for myalgias, back pain, joint pain and falls.  Skin: Negative for itching and rash.  Neurological: Negative for dizziness, tingling, tremors, sensory change, speech change, focal weakness, seizures, loss of consciousness, weakness and headaches. altered Endo/Heme/Allergies: Negative for environmental allergies and polydipsia. Does not bruise/bleed easily.  SUBJECTIVE:   VITAL SIGNS: Temp:  [97.9 F (36.6 C)-98.7 F (37.1 C)] 97.9 F (36.6 C) (09/28 1947) Pulse Rate:  [0-123] 116 (09/29 0415) Resp:  [0-20] 18 (09/28 2301) BP: (134-193)/(81-123) 158/96 (09/28 2301) SpO2:  [0 %-99 %] 95 % (09/28 2301)  PHYSICAL EXAMINATION: General:  Well nourshed male, agitated, diaphoretic Neuro: GCS 13, no focal deficit, altered, inappropriate answers HEENT: normocephalic atraumatic  Cardiovascular:  S1 and S2 tachycardic    Lungs: clear to auscultation bilaterally Abdomen: soft distended nontender + BS Musculoskeletal: no deformity no swelling Skin: clammy   Recent Labs Lab 04/14/17 1300 04/14/17 1657 04/14/17 2305 04/15/17 0554  NA 142 144  --  138  K 3.4* 3.4*  --  3.4*  CL 111 111  --  107  CO2 23  --   --  22  BUN 19 20  --  17  CREATININE 0.85 0.70 0.90 0.78  GLUCOSE 103* 88  --  91    Recent Labs Lab 04/14/17 1300 04/14/17 1657 04/14/17 2305 04/15/17 0554  HGB 12.5* 11.6* 11.7* 11.8*  HCT 37.0* 34.0* 33.7* 35.1*  WBC 5.3  --  5.3 5.4  PLT PLATELET CLUMPS NOTED ON SMEAR, UNABLE TO ESTIMATE  --  PLATELET CLUMPS NOTED ON SMEAR, UNABLE TO ESTIMATE PLATELET CLUMPS NOTED ON SMEAR, UNABLE TO ESTIMATE   Dg Chest 2 View  Result Date: 04/14/2017 CLINICAL DATA:  Left-sided chest pain and dizziness 5 days with left arm pain and tachycardia. EXAM: CHEST  2 VIEW COMPARISON:  None. FINDINGS: Lungs are adequately inflated and otherwise clear. Cardiomediastinal silhouette is within normal. Bones and soft tissues are normal. IMPRESSION: No active cardiopulmonary disease. Electronically Signed   By: Marin Olp M.D.   On: 04/14/2017 13:36   CARDIAC CATH- 04/15/17  Severe 2 vessel coronary disease with calcific total occlusion of the mid right coronary over a long segment.  Severe calcified coronary disease involving the proximal to mid LAD with 60-80% stenosis depending upon review and FFR positive at 0.74 beyond the second diagonal.  Luminal irregularities with  less than 50% stenosis in the circumflex.  Normal left ventricular systolic function. EF greater than 55%. Mildly elevated LVEDP, 18 mmHg.   ASSESSMENT / PLAN:   NEURO: Acute ETOH withdrawal with delirium GCS 13  Agitated with altered mental status Last drink was evening of the 04/13/17 prior to admit  Did not receive thiamine and folate per nursing and not able to follow commands Spitting out his meds Start on Precedex ggt CIWA  assessment and Neurochecks Q1 Transferred to ICU.  Continue medical restraints Aspiration precautions HOB elevated at 30 deg.  Banana Bag x1    CARDIAC: CAD with severe occlusive coronary disease  Cardiology discussing option between  CABG versus LAD atherectomy followed by stenting with medical therapy for right coronary.  Chronic medical therapy without revascularization is probably not a good option due to the large region at risk should the LAD close. Aggressive blood pressure control and lipid management.  Recent Labs  04/14/17 2305 04/15/17 0234 04/15/17 0540  TROPONINI <0.03 <0.03 <0.03  currently Hypertensive most likely secondary to acute withdrawal Started on Precedex ggt   PULMONARY: H/o smoking  Reviewed CXR no signs of active disease Keep Sat >92%   ID: No signs of active infection No leukocytosis Low grade fever started around time of altered sensorium Most likely secondary to withdrawal Trend fever curve, WBC  Endocrine: No history of insulin dependence Multiple HgbA1c checks  Most recent 5.9 indicative of pre-diabetes Goal BG 140-'180mg'$ /dl per ICU Glycemic protocol TSH 3.117 wnl  GI: NPO Aspiration precautions H/o GERD Previously on PPI Continue protonix '40mg'$  IV Transaminases AST 46, ALT 35 and Alk phos 88 Given h/o ETOH use consider RUQ [place] or CTA/P possibility of liver cirrhosis When cleared for PO consider Librium  if no underlying Hepatic dysfunction  Heme: Megaloblastic Anemia H/o chronic ETOH abuse Folate ordered in IVF No signs of active bleeding Given cardiac history  If needed Hgb<8 goal for  Transfusion of PRBCs No  h/o coagulopathy DVT PPx-> SCDs and Heparin   RENAL At baseline Creatinine GFR >60 Normal microalb/creat ratio in January Monitor UOP .Marland Kitchen Lab Results  Component Value Date   CREATININE 0.78 04/15/2017   CREATININE 0.90 04/14/2017   CREATININE 0.70 04/14/2017  electrolyte replacement as  needed Hypokalemic at 3.4    Dr Seward Carol have personally reviewed patient's available data, including medical history, events of note, physical examination and test results as part of my evaluation. I have discussed with resident/NP and other care providers such as Elink, pharmacist, and RN. The patient is critically ill with multiple organ systems failure and requires high complexity decision making for assessment and support, frequent evaluation and titration of therapies, application of advanced monitoring technologies and extensive interpretation of multiple databases.  Critical Care Time devoted to patient care services described in this note is 34 Minutes. This time reflects time of care of this signee Dr Seward Carol. This critical care time does not reflect procedure time, or teaching time or supervisory time but could involve care discussion time  . DISPOSITION: Transfer to ICU PROGNOSIS: Guarded CC TIME: 34 mins FAMILY: not at bedside  Signed Dr Seward Carol Pulmonary Critical [facility] [place] Pager: [phone]  04/16/2017, 4:56 AM

## 2017-04-16 NOTE — Progress Notes (Signed)
PCCM Interval Note  Somnolent on Precedex. Clearly less agitated. Working on weaning the Precedex now. No chest pain, no evidence hemodynamic instability We will continue to follow  Baltazar Apo, MD, PhD 04/16/2017, 9:33 AM [facility] [phone] or if no answer [phone]

## 2017-04-16 NOTE — Progress Notes (Signed)
3 mg of Ativan IVP calmed patient for 20 minutes. Patient back to being physically and verbally active. CIWA rescored. Covering [facility] paged.

## 2017-04-16 NOTE — Progress Notes (Signed)
0140: After patient back to bed and 3 mg Ativan given, patient continued to be aggressive/trying to get out of bed. Order received to place patient in soft restraints, applied to BL ankles and wrist. One time 2 mg order of Ativan given at this time.   0200: Patient continues to be aggressive and verbally abusive to staff. Order received for one time Haldol 5 mg IVP  0300: Patient minimally responding to Ativan/Haldol. RN unable to establish second IV due to patient being physical. Order received to give 2 mg Ativan IM.   0345: Patient continues to be physical and verbal to staff. Patient rescored on CIWA scale. Last scores ranging 20-21. 3 mg IVP Ativan given to patient. STAT RN paged to room to assess patient.

## 2017-04-16 NOTE — Progress Notes (Signed)
[facility] Progress Note Patient Name: Richard Calhoun DOB: 06-Dec-1960 MRN: [id number]   Date of Service  04/16/2017  HPI/Events of Note  Patient admitted with alcohol withdrawal. Nurse reports she is on max rate of Precedex at 0.7. Patient still agitated, combative, and requiring bolus benzodiazepines IV.   eICU Interventions  Increasing Precedex maximum drip rate to 1.2      Intervention Category Major Interventions: Delirium, psychosis, severe agitation - evaluation and management  Tera Partridge 04/16/2017, 11:53 PM

## 2017-04-17 DIAGNOSIS — I25119 Atherosclerotic heart disease of native coronary artery with unspecified angina pectoris: Secondary | ICD-10-CM

## 2017-04-17 LAB — GLUCOSE, CAPILLARY
GLUCOSE-CAPILLARY: 100 mg/dL — AB (ref 65–99)
GLUCOSE-CAPILLARY: 102 mg/dL — AB (ref 65–99)
GLUCOSE-CAPILLARY: 113 mg/dL — AB (ref 65–99)
Glucose-Capillary: 114 mg/dL — ABNORMAL HIGH (ref 65–99)
Glucose-Capillary: 115 mg/dL — ABNORMAL HIGH (ref 65–99)
Glucose-Capillary: 126 mg/dL — ABNORMAL HIGH (ref 65–99)
Glucose-Capillary: 141 mg/dL — ABNORMAL HIGH (ref 65–99)

## 2017-04-17 MED ORDER — GI COCKTAIL ~~LOC~~
30.0000 mL | Freq: Three times a day (TID) | ORAL | Status: DC | PRN
  Administered 2017-04-17: 30 mL via ORAL
  Filled 2017-04-17 (×3): qty 30

## 2017-04-17 MED ORDER — HYDROCODONE-ACETAMINOPHEN 5-325 MG PO TABS
1.0000 | ORAL_TABLET | Freq: Four times a day (QID) | ORAL | Status: DC | PRN
  Administered 2017-04-17 – 2017-04-21 (×6): 1 via ORAL
  Filled 2017-04-17 (×6): qty 1

## 2017-04-17 MED ORDER — CHLORDIAZEPOXIDE HCL 10 MG PO CAPS
10.0000 mg | ORAL_CAPSULE | Freq: Three times a day (TID) | ORAL | Status: DC
  Administered 2017-04-17 – 2017-04-21 (×12): 10 mg via ORAL
  Filled 2017-04-17 (×12): qty 2

## 2017-04-17 NOTE — Clinical Social Work Note (Signed)
CSW attempted to see patient to provide resources for substance abuse, however patient was sleeping and would not wake up.  CSW to try at a later time, CSW continuing to follow patient's progress throughout discharge planning.  Jones Broom. [place], MSW, [place]  04/17/2017 12:41 PM

## 2017-04-17 NOTE — Progress Notes (Signed)
Patient complaining of tingling and pain in both hands, Elink MD notified, RN will continue to monitor.

## 2017-04-17 NOTE — Progress Notes (Signed)
Name: Richard Calhoun MRN: [id number] DOB: 01/30/1961    ADMISSION DATE:  04/14/2017 CONSULTATION DATE: 04/16/17 REFERRING MD : Cathlean Sauer MD  CHIEF COMPLAINT:  Agitated h/o ETOH abuse  BRIEF PATIENT DESCRIPTION: 56 yr old male with PMHx of HTN, CAD, HLD, recently admitted for unstable angina found to have severe 2 vessel disease on 04/15/17 Cardiac cath and was being considered for CABG vs PCI.  His CIWA protocol was held during the day. This evening agitated and increasingly altered HTN Tachycardic and diaphoretic despite 13 mg Ativan, and Haldol. Acute ETOH withdrawal.  SIGNIFICANT EVENTS  Agitated, altered mental status not responding to prn meds  STUDIES:  S/p cardiac cath- 2 vessel disease- 04/15/17  SUBJECTIVE:  Has been intermittently agitated and then at times has been directable and interactive. Precedex has been decreased to 0.5   VITAL SIGNS: Temp:  [97.8 F (36.6 C)-99.5 F (37.5 C)] 98.2 F (36.8 C) (09/30 0802) Pulse Rate:  [71-138] 79 (09/30 0900) Resp:  [12-49] 13 (09/30 0900) BP: (126-188)/(65-112) 160/90 (09/30 0900) SpO2:  [96 %-100 %] 100 % (09/30 0900)  PHYSICAL EXAMINATION: General:  Ill-appearing man, in restraints Neuro: Wakes easily to voice, very mild tremor, better oriented, answering questions appropriately but poor insight into disease HEENT: Oropharynx clear, pupils equal and reactive Cardiovascular:  Regular, no murmur Lungs: Clear bilaterally Abdomen: Soft, mildly distended, nontender, positive bowel sounds Musculoskeletal: No deformities noted Skin: Warm, no rashes   Recent Labs Lab 04/14/17 1300 04/14/17 1657 04/14/17 2305 04/15/17 0554 04/16/17 0603  NA 142 144  --  138 136  K 3.4* 3.4*  --  3.4* 3.5  CL 111 111  --  107 106  CO2 23  --   --  22 21*  BUN 19 20  --  17 14  CREATININE 0.85 0.70 0.90 0.78 0.72  GLUCOSE 103* 88  --  91 115*    Recent Labs Lab 04/14/17 1300 04/14/17 1657 04/14/17 2305 04/15/17 0554  HGB 12.5*  11.6* 11.7* 11.8*  HCT 37.0* 34.0* 33.7* 35.1*  WBC 5.3  --  5.3 5.4  PLT PLATELET CLUMPS NOTED ON SMEAR, UNABLE TO ESTIMATE  --  PLATELET CLUMPS NOTED ON SMEAR, UNABLE TO ESTIMATE PLATELET CLUMPS NOTED ON SMEAR, UNABLE TO ESTIMATE   No results found. CARDIAC CATH- 04/15/17  Severe 2 vessel coronary disease with calcific total occlusion of the mid right coronary over a long segment.  Severe calcified coronary disease involving the proximal to mid LAD with 60-80% stenosis depending upon review and FFR positive at 0.74 beyond the second diagonal.  Luminal irregularities with less than 50% stenosis in the circumflex.  Normal left ventricular systolic function. EF greater than 55%. Mildly elevated LVEDP, 18 mmHg.   ASSESSMENT / PLAN:   NEURO: Acute ETOH withdrawal with delirium Continue to wean Precedex as able Start moderate dose scheduled Librium 9/30 Thiamine and folate given Continue medical restraints until he is no longer experiencing intermittent agitation  CARDIAC: CAD with severe occlusive coronary disease  Cardiology discussing option between  CABG versus LAD atherectomy followed by stenting with medical therapy for right coronary.  Hypertension Lipitor, aspirin, Imdur, losartan, metoprolol IV Assessment possible CABG once withdrawal symptoms resolve  PULMONARY: H/o smoking, suspect COPD, no active disease Pulmonary hygiene, follow respiration status  ID: No signs of active infection Follow clinically, WBC, fever curve  Endocrine: No active issues   GI: H/o GERD Mild transaminitis, likely due to alcohol Protonix Nothing by mouth until he can safely  swallow  Heme: Megaloblastic Anemia H/o chronic ETOH abuse Transfusion for hemoglobin goal greater than 8.0 DVT prophylaxis enoxaparin  RENAL No active issues Follow BMP, urine output Replete electrolytes as indicated  Independent CC time 35 minutes   Baltazar Apo, MD, PhD 04/17/2017, 10:44 AM [place]  [facility] [phone] or if no answer [phone]

## 2017-04-18 ENCOUNTER — Inpatient Hospital Stay (HOSPITAL_COMMUNITY): Payer: Self-pay

## 2017-04-18 DIAGNOSIS — I2510 Atherosclerotic heart disease of native coronary artery without angina pectoris: Secondary | ICD-10-CM

## 2017-04-18 DIAGNOSIS — I200 Unstable angina: Secondary | ICD-10-CM

## 2017-04-18 LAB — BASIC METABOLIC PANEL
Anion gap: 12 (ref 5–15)
Anion gap: 9 (ref 5–15)
BUN: 13 mg/dL (ref 6–20)
BUN: 22 mg/dL — ABNORMAL HIGH (ref 6–20)
CALCIUM: 8.6 mg/dL — AB (ref 8.9–10.3)
CALCIUM: 8.6 mg/dL — AB (ref 8.9–10.3)
CO2: 14 mmol/L — ABNORMAL LOW (ref 22–32)
CO2: 19 mmol/L — AB (ref 22–32)
CREATININE: 0.87 mg/dL (ref 0.61–1.24)
CREATININE: 1.16 mg/dL (ref 0.61–1.24)
Chloride: 106 mmol/L (ref 101–111)
Chloride: 105 mmol/L (ref 101–111)
GFR calc Af Amer: >60 mL/min (ref >60)
GFR calc Af Amer: >60 mL/min (ref >60)
GFR calc non Af Amer: >60 mL/min (ref >60)
GLUCOSE: 103 mg/dL — AB (ref 65–99)
Glucose, Bld: 122 mg/dL — ABNORMAL HIGH (ref 65–99)
POTASSIUM: 3.4 mmol/L — AB (ref 3.5–5.1)
Potassium: 3.6 mmol/L (ref 3.5–5.1)
Sodium: 132 mmol/L — ABNORMAL LOW (ref 135–145)
Sodium: 133 mmol/L — ABNORMAL LOW (ref 135–145)

## 2017-04-18 LAB — GLUCOSE, CAPILLARY
GLUCOSE-CAPILLARY: 122 mg/dL — AB (ref 65–99)
GLUCOSE-CAPILLARY: 130 mg/dL — AB (ref 65–99)
GLUCOSE-CAPILLARY: 135 mg/dL — AB (ref 65–99)
Glucose-Capillary: 120 mg/dL — ABNORMAL HIGH (ref 65–99)
Glucose-Capillary: 162 mg/dL — ABNORMAL HIGH (ref 65–99)

## 2017-04-18 LAB — CBC
HCT: 34.5 % — ABNORMAL LOW (ref 39.0–52.0)
Hemoglobin: 11.1 g/dL — ABNORMAL LOW (ref 13.0–17.0)
MCH: 34.2 pg — AB (ref 26.0–34.0)
MCHC: 32.2 g/dL (ref 30.0–36.0)
MCV: 106.2 fL — AB (ref 78.0–100.0)
PLATELETS: UNDETERMINED 10*3/uL (ref 150–400)
RBC: 3.25 MIL/uL — ABNORMAL LOW (ref 4.22–5.81)
RDW: 13.2 % (ref 11.5–15.5)
WBC: 8.1 10*3/uL (ref 4.0–10.5)

## 2017-04-18 LAB — MAGNESIUM: Magnesium: 1.6 mg/dL — ABNORMAL LOW (ref 1.7–2.4)

## 2017-04-18 MED ORDER — PANTOPRAZOLE SODIUM 40 MG PO TBEC
40.0000 mg | DELAYED_RELEASE_TABLET | Freq: Every day | ORAL | Status: DC
  Administered 2017-04-18 – 2017-04-21 (×4): 40 mg via ORAL
  Filled 2017-04-18 (×4): qty 1

## 2017-04-18 MED ORDER — LORAZEPAM 2 MG/ML IJ SOLN
1.0000 mg | Freq: Three times a day (TID) | INTRAMUSCULAR | Status: DC | PRN
  Administered 2017-04-18 (×2): 2 mg via INTRAVENOUS
  Administered 2017-04-19: 1 mg via INTRAVENOUS
  Filled 2017-04-18 (×3): qty 1

## 2017-04-18 MED ORDER — MAGNESIUM SULFATE 4 GM/100ML IV SOLN
4.0000 g | Freq: Once | INTRAVENOUS | Status: AC
  Administered 2017-04-18: 4 g via INTRAVENOUS
  Filled 2017-04-18: qty 100

## 2017-04-18 MED ORDER — IBUPROFEN 800 MG PO TABS
800.0000 mg | ORAL_TABLET | Freq: Once | ORAL | Status: AC
  Administered 2017-04-18: 800 mg via ORAL
  Filled 2017-04-18: qty 1
  Filled 2017-04-18: qty 4

## 2017-04-18 MED ORDER — METOPROLOL TARTRATE 25 MG PO TABS
25.0000 mg | ORAL_TABLET | Freq: Two times a day (BID) | ORAL | Status: DC
  Administered 2017-04-18 – 2017-04-21 (×7): 25 mg via ORAL
  Filled 2017-04-18 (×8): qty 1
  Filled 2017-04-18: qty 2

## 2017-04-18 MED ORDER — POTASSIUM CHLORIDE CRYS ER 20 MEQ PO TBCR
40.0000 meq | EXTENDED_RELEASE_TABLET | ORAL | Status: AC
  Administered 2017-04-18 (×2): 40 meq via ORAL
  Filled 2017-04-18 (×2): qty 2

## 2017-04-18 MED FILL — Lidocaine HCl Local Inj 2%: INTRAMUSCULAR | Qty: 10 | Status: AC

## 2017-04-18 NOTE — Progress Notes (Addendum)
Name: Richard Calhoun MRN: [id number] DOB: 1960/08/12    ADMISSION DATE:  04/14/2017 CONSULTATION DATE: 04/16/17 REFERRING MD : Cathlean Sauer MD  CHIEF COMPLAINT:  Agitated h/o ETOH abuse  BRIEF PATIENT DESCRIPTION: 56 yr old male with PMHx of HTN, CAD, HLD, recently admitted for unstable angina found to have severe 2 vessel disease on 04/15/17 Cardiac cath and was being considered for CABG vs PCI. He became agitated with HTN, tachycardia c/w with acute alcohol withdrawal which required precedex gtt for adequate control.   SIGNIFICANT EVENTS  Precedex gtt 9/29 >>> 9/30. Drip dc'ed without further agitation   LINES/TUBES PIV x 2   STUDIES:  S/p cardiac cath- 2 vessel disease- 04/15/17  SUBJECTIVE:  Precedex weaned and discontinued in interval. No longer agitated or requiring restraints. Feels subjectively improved, notes bilateral hand pain L > R    VITAL SIGNS: Temp:  [97.8 F (36.6 C)-100 F (37.8 C)] 99 F (37.2 C) (10/01 0738) Pulse Rate:  [80-115] 105 (10/01 0700) Resp:  [9-34] 20 (10/01 0700) BP: (105-178)/(59-101) 140/82 (10/01 0700) SpO2:  [97 %-100 %] 98 % (10/01 0700)  PHYSICAL EXAMINATION:  General:  Sitting in chair, no acute distress  Neuro: Alert and fully oriented, no FND HEENT: PERRL, oropharynx clear Cardiovascular:  Tachycardic, regular, no murmurs appreciated  Lungs: CTAB, no increased effort  Abdomen: Soft, non-tender  Musculoskeletal: Bilateral hand edema, TTP of L CMC joint Skin: Warm, ecchymosis at cath site    Recent Labs Lab 04/15/17 0554 04/16/17 0603 04/18/17 0416  NA 138 136 132*  K 3.4* 3.5 3.4*  CL 107 106 106  CO2 22 21* 14*  BUN 17 14 13   CREATININE 0.78 0.72 0.87  GLUCOSE 91 115* 103*    Recent Labs Lab 04/14/17 2305 04/15/17 0554 04/18/17 0416  HGB 11.7* 11.8* 11.1*  HCT 33.7* 35.1* 34.5*  WBC 5.3 5.4 8.1  PLT PLATELET CLUMPS NOTED ON SMEAR, UNABLE TO ESTIMATE PLATELET CLUMPS NOTED ON SMEAR, UNABLE TO ESTIMATE PLATELET CLUMPS  NOTED ON SMEAR, UNABLE TO ESTIMATE   CARDIAC CATH- 04/15/17  Severe 2 vessel coronary disease with calcific total occlusion of the mid right coronary over a long segment.  Severe calcified coronary disease involving the proximal to mid LAD with 60-80% stenosis depending upon review and FFR positive at 0.74 beyond the second diagonal.  Luminal irregularities with less than 50% stenosis in the circumflex.  Normal left ventricular systolic function. EF greater than 55%. Mildly elevated LVEDP, 18 mmHg.   ASSESSMENT / PLAN:  NEURO: Acute ETOH withdrawal with delirium --Off precedex, tolerating well and no longer requiring restraints  --Librium 10 mg TID (started 9/30), taper, IV Ativan 2 mg q1hr prn- no doses for >24 hrs  -thiamine, folic   CARDIAC: CAD with severe occlusive coronary disease, HTN  --Cardiology discussing option between CABG versus LAD atherectomy followed by stenting + medical therapy for right coronary. Further assessment can resume following stabilization and transfer from ICU --Continue Lipitor, aspirin, Imdur, losartan, metoprolol IV  PULMONARY: H/o smoking, suspect COPD, no active disease --Stable, follow respiratory status   ID: No signs of active infection --Follow clinically, WBC, fever curve  Endocrine: No active issues --SSI   GI: H/o GERD Mild AST elevation, likely due to alcohol --Protonix, GI cocktail prn  --Advance diet with improved mental status   HEME: Megaloblastic Anemia H/o chronic ETOH abuse --Hgb stable 11.1 --Transfusion for hemoglobin goal greater than 8.0 --DVT prophylaxis enoxaparin  RENAL: Metabolic acidosis --May be related to lack  of intake during hospitalization  --Follow BMP, urine output --Replete electrolytes as indicated  MSK: Hand Edema --Likely related to PIV, post-cath. Potential for injury during agitation, restraint use --No imaging indicated at this time, pain medications prn    STAFF NOTE: I, Merrie Roof, MD FACP have personally reviewed patient's available data, including medical history, events of note, physical examination and test results as part of my evaluation. I have discussed with resident/NP and other care providers such as pharmacist, RN and RRT. In addition, I personally evaluated patient and elicited key findings of: awake, in chair, no distress, Oriented x 3, eating, lungs clear, abdo soft, had edema mild, pcxr none new, he is improved with seveer etoh wd in setting of cabg needs, no active chest pain, re add thmaine, folic acid, reduce ativan to q8h prn not q1h, maintain and reduce librium in am slowly over 4 days, advance diet, supp k, supp mag in settnig cardiac dz and etoh wd, he has had some auto diuresis, follow ouptut and if maintains high will assess urine na, has NONAG - cause unknown, consider use bicarb to correct but in setting normal renal fxn he shold absorb back, dc all saline , bmet in 2000, maintain teel for ischemia risk, I updated pt in full, left hand swollen, limited miotion shoulder - xray hand wrist, shoulder, max BB , slight tachy  Lavon Paganini. Titus Mould, MD, [place] Pgr: [facility] 04/18/2017 11:24 AM

## 2017-04-18 NOTE — Clinical Social Work Note (Signed)
Clinical Social Work Assessment  Patient Details  Name: Richard Calhoun MRN: [id number] Date of Birth: [birth year]  Date of referral:  04/18/17               Reason for consult:  Substance Use/ETOH Abuse                Permission sought to share information with:  Case Manager Permission granted to share information::  Yes, Verbal Permission Granted  Name::     Administrator, sports   Agency::     Relationship::     Contact Information:     Housing/Transportation Living arrangements for the past 2 months:  Single Family Home (with girlfriend. ) Source of Information:  Patient Patient Interpreter Needed:  None Criminal Activity/Legal Involvement Pertinent to Current Situation/Hospitalization:  No - Comment as needed Significant Relationships:  Significant Other Lives with:    Do you feel safe going back to the place where you live?  Yes Need for family participation in patient care:  Yes (Comment)  Care giving concerns:  CSW consulted for substance abuse resources. CSW spoke with pt at bedside and at this time pt expresses no concerns.   Social Worker assessment / plan:  CSW spoke with pt at bedside. During this time CSW was informed that pt is from home with girlfriend.  Pt is planning to return back home with her at the time of discharge. CSW offered support and substance abuse resources to pt, however pt didn't feel that pt needed help with substance use. CSW understood and informed pt that pt was not required to take them if pt didn't want to.  Employment status:  Unemployed Forensic scientist:  Self Pay (Medicaid Pending) PT Recommendations:  Not assessed at this time Information / Referral to community resources:     Patient/Family's Response to care:  Pt is understanding and agreeable to plan of care at this time. Pt expresses that pt is ready to get home as pt is "stiff".  Patient/Family's Understanding of and Emotional Response to Diagnosis, Current Treatment, and Prognosis:   No further questions or concerns have been presented to CSW at this time.   Emotional Assessment Appearance:  Appears stated age Attitude/Demeanor/Rapport:    Affect (typically observed):  Pleasant Orientation:  Oriented to Self, Oriented to Place, Oriented to  Time, Oriented to Situation Alcohol / Substance use:  Tobacco Use, Alcohol Use Psych involvement (Current and /or in the community):  No (Comment)  Discharge Needs  Concerns to be addressed:  Financial / Insurance Concerns Readmission within the last 30 days:  No Current discharge risk:  None Barriers to Discharge:  No Barriers Identified   Wetzel Bjornstad, [place] 04/18/2017, 8:19 AM

## 2017-04-18 NOTE — Progress Notes (Signed)
Progress Note  Patient Name: Richard Calhoun Date of Encounter: 04/18/2017  Primary Cardiologist: Dr. Tamala Julian  Subjective   No chest pain.  He had some hand and arm swelling.  Inpatient Medications    Scheduled Meds: . aspirin EC  325 mg Oral Daily  . atorvastatin  80 mg Oral q1800  . chlordiazePOXIDE  10 mg Oral TID  . enoxaparin (LOVENOX) injection  40 mg Subcutaneous Daily  . insulin aspart  2-6 Units Subcutaneous Q4H  . isosorbide mononitrate  30 mg Oral Daily  . losartan  50 mg Oral Daily  . metoprolol tartrate  25 mg Oral BID  . pantoprazole  40 mg Oral Daily  . sodium chloride flush  3 mL Intravenous Q12H  . vitamin C  500 mg Oral Daily   Continuous Infusions: . sodium chloride Stopped (04/18/17 0600)   PRN Meds: sodium chloride, acetaminophen, gi cocktail, HYDROcodone-acetaminophen, LORazepam, morphine injection, ondansetron (ZOFRAN) IV, sodium chloride flush   Vital Signs    Vitals:   04/18/17 1600 04/18/17 1700 04/18/17 1800 04/18/17 1900  BP: (!) 95/59 112/60 122/83 (!) 146/76  Pulse: 99 79 88 (!) 102  Resp: 19 13 16    Temp:      TempSrc:      SpO2: 98% 98% 97% 97%  Weight:      Height:        Intake/Output Summary (Last 24 hours) at 04/18/17 2212 Last data filed at 04/18/17 1605  Gross per 24 hour  Intake              170 ml  Output             1050 ml  Net             -880 ml   Filed Weights   04/14/17 1253 04/14/17 2300  Weight: 200 lb (90.7 kg) 205 lb 12.8 oz (93.4 kg)    Telemetry    NSR - Personally Reviewed  ECG    NSR, no ST changes - Personally Reviewed  Physical Exam   GEN: No acute distress.   Neck: No JVD Cardiac: RRR, no murmurs, rubs, or gallops.  Respiratory: Clear to auscultation bilaterally. GI: Soft, nontender, non-distended  MS: Hand swelling Neuro:  Nonfocal  Psych: Normal affect   Labs    Chemistry Recent Labs Lab 04/16/17 0603 04/18/17 0416 04/18/17 2001  NA 136 132* 133*  K 3.5 3.4* 3.6  CL 106 106  105  CO2 21* 14* 19*  GLUCOSE 115* 103* 122*  BUN 14 13 22*  CREATININE 0.72 0.87 1.16  CALCIUM 8.7* 8.6* 8.6*  PROT 6.7  --   --   ALBUMIN 3.5  --   --   AST 46*  --   --   ALT 35  --   --   ALKPHOS 88  --   --   BILITOT 0.9  --   --   GFRNONAA >60 >60 >60  GFRAA >60 >60 >60  ANIONGAP 9 12 9      Hematology Recent Labs Lab 04/14/17 2305 04/15/17 0554 04/18/17 0416  WBC 5.3 5.4 8.1  RBC 3.19* 3.29* 3.25*  HGB 11.7* 11.8* 11.1*  HCT 33.7* 35.1* 34.5*  MCV 105.6* 106.7* 106.2*  MCH 36.7* 35.9* 34.2*  MCHC 34.7 33.6 32.2  RDW 13.6 13.2 13.2  PLT PLATELET CLUMPS NOTED ON SMEAR, UNABLE TO ESTIMATE PLATELET CLUMPS NOTED ON SMEAR, UNABLE TO ESTIMATE PLATELET CLUMPS NOTED ON SMEAR, UNABLE TO ESTIMATE  Cardiac Enzymes Recent Labs Lab 04/14/17 2305 04/15/17 0234 04/15/17 0540  TROPONINI <0.03 <0.03 <0.03    Recent Labs Lab 04/14/17 1313  TROPIPOC 0.00     BNPNo results for input(s): BNP, PROBNP in the last 168 hours.   DDimer No results for input(s): DDIMER in the last 168 hours.   Radiology    Dg Shoulder Left  Result Date: 04/18/2017 CLINICAL DATA:  Left shoulder pain. EXAM: LEFT SHOULDER - 2+ VIEW COMPARISON:  None. FINDINGS: There is no evidence of fracture or dislocation. There is no evidence of arthropathy or other focal bone abnormality. Soft tissues are unremarkable. IMPRESSION: Normal left shoulder. Electronically Signed   By: Marijo Conception, M.D.   On: 04/18/2017 14:19   Dg Hand Complete Left  Result Date: 04/18/2017 CLINICAL DATA:  Left hand pain. EXAM: LEFT HAND - COMPLETE 3+ VIEW COMPARISON:  None. FINDINGS: There is no evidence of fracture or dislocation. Narrowing and osteophyte formation is seen involving the first carpometacarpal joint. Soft tissues are unremarkable. IMPRESSION: Moderate osteoarthritis of first carpometacarpal joint. No acute abnormality seen the left hand. Electronically Signed   By: Marijo Conception, M.D.   On: 04/18/2017 14:20     Cardiac Studies   Negative troponins  Patient Profile     56 y.o. male with CAD  Assessment & Plan    1) Would plan for LAD PCI with atherectomy, when h is more stable from his alcohol withdrawal perspective.  Also, would like his hand/arm swelling issue to be resolved, especially if there is a question of DVT.    Explained atherectomy to the patient.  Will follow.   For questions or updates, please contact [place] Please consult [email or web address] for contact info under Cardiology/STEMI.      Signed, Larae Grooms, MD  04/18/2017, 10:12 PM

## 2017-04-19 ENCOUNTER — Inpatient Hospital Stay (HOSPITAL_COMMUNITY): Payer: Self-pay

## 2017-04-19 DIAGNOSIS — E7849 Other hyperlipidemia: Secondary | ICD-10-CM

## 2017-04-19 DIAGNOSIS — R600 Localized edema: Secondary | ICD-10-CM

## 2017-04-19 DIAGNOSIS — M79642 Pain in left hand: Secondary | ICD-10-CM

## 2017-04-19 DIAGNOSIS — M7989 Other specified soft tissue disorders: Secondary | ICD-10-CM

## 2017-04-19 LAB — CBC
HCT: 32.1 % — ABNORMAL LOW (ref 39.0–52.0)
HCT: 34.6 % — ABNORMAL LOW (ref 39.0–52.0)
HEMOGLOBIN: 10.8 g/dL — AB (ref 13.0–17.0)
Hemoglobin: 11.5 g/dL — ABNORMAL LOW (ref 13.0–17.0)
MCH: 35.1 pg — AB (ref 26.0–34.0)
MCH: 34.8 pg — ABNORMAL HIGH (ref 26.0–34.0)
MCHC: 33.6 g/dL (ref 30.0–36.0)
MCHC: 33.2 g/dL (ref 30.0–36.0)
MCV: 104.2 fL — AB (ref 78.0–100.0)
MCV: 104.8 fL — ABNORMAL HIGH (ref 78.0–100.0)
PLATELETS: UNDETERMINED 10*3/uL (ref 150–400)
Platelets: UNDETERMINED 10*3/uL (ref 150–400)
RBC: 3.08 MIL/uL — AB (ref 4.22–5.81)
RBC: 3.3 MIL/uL — ABNORMAL LOW (ref 4.22–5.81)
RDW: 13.1 % (ref 11.5–15.5)
RDW: 13.1 % (ref 11.5–15.5)
WBC: 6.1 10*3/uL (ref 4.0–10.5)
WBC: 7.8 10*3/uL (ref 4.0–10.5)

## 2017-04-19 LAB — BASIC METABOLIC PANEL
ANION GAP: 8 (ref 5–15)
BUN: 20 mg/dL (ref 6–20)
CHLORIDE: 108 mmol/L (ref 101–111)
CO2: 18 mmol/L — AB (ref 22–32)
CREATININE: 0.89 mg/dL (ref 0.61–1.24)
Calcium: 8.2 mg/dL — ABNORMAL LOW (ref 8.9–10.3)
GFR calc non Af Amer: >60 mL/min (ref >60)
GLUCOSE: 118 mg/dL — AB (ref 65–99)
Potassium: 3.4 mmol/L — ABNORMAL LOW (ref 3.5–5.1)
Sodium: 134 mmol/L — ABNORMAL LOW (ref 135–145)

## 2017-04-19 LAB — GLUCOSE, CAPILLARY
GLUCOSE-CAPILLARY: 182 mg/dL — AB (ref 65–99)
GLUCOSE-CAPILLARY: 119 mg/dL — AB (ref 65–99)
GLUCOSE-CAPILLARY: 107 mg/dL — AB (ref 65–99)
Glucose-Capillary: 117 mg/dL — ABNORMAL HIGH (ref 65–99)
Glucose-Capillary: 112 mg/dL — ABNORMAL HIGH (ref 65–99)
Glucose-Capillary: 126 mg/dL — ABNORMAL HIGH (ref 65–99)

## 2017-04-19 LAB — PHOSPHORUS: Phosphorus: 1.9 mg/dL — ABNORMAL LOW (ref 2.5–4.6)

## 2017-04-19 LAB — PLATELET COUNT: PLATELETS: UNDETERMINED 10*3/uL (ref 150–400)

## 2017-04-19 LAB — MAGNESIUM: Magnesium: 2.3 mg/dL (ref 1.7–2.4)

## 2017-04-19 MED ORDER — SODIUM CHLORIDE 0.9 % IV SOLN: 250.0000 mL | INTRAVENOUS | Status: DC | PRN

## 2017-04-19 MED ORDER — SODIUM CHLORIDE 0.9 % WEIGHT BASED INFUSION
3.0000 mL/kg/h | INTRAVENOUS | Status: DC
  Administered 2017-04-20: 3 mL/kg/h via INTRAVENOUS

## 2017-04-19 MED ORDER — ASPIRIN EC 81 MG PO TBEC
81.0000 mg | DELAYED_RELEASE_TABLET | Freq: Every day | ORAL | Status: DC
Start: 2017-04-20 — End: 2017-04-21
  Administered 2017-04-20 – 2017-04-21 (×2): 81 mg via ORAL
  Filled 2017-04-19 (×2): qty 1

## 2017-04-19 MED ORDER — SODIUM CHLORIDE 0.9% FLUSH: 3.0000 mL | INTRAVENOUS | Status: DC | PRN

## 2017-04-19 MED ORDER — POTASSIUM PHOSPHATES 15 MMOLE/5ML IV SOLN
20.0000 mmol | Freq: Once | INTRAVENOUS | Status: AC
  Administered 2017-04-19: 20 mmol via INTRAVENOUS
  Filled 2017-04-19: qty 6.67

## 2017-04-19 MED ORDER — POTASSIUM CHLORIDE CRYS ER 20 MEQ PO TBCR
40.0000 meq | EXTENDED_RELEASE_TABLET | Freq: Two times a day (BID) | ORAL | Status: DC
  Administered 2017-04-19 – 2017-04-21 (×5): 40 meq via ORAL
  Filled 2017-04-19 (×6): qty 2

## 2017-04-19 MED ORDER — CLOPIDOGREL BISULFATE 300 MG PO TABS
300.0000 mg | ORAL_TABLET | Freq: Once | ORAL | Status: AC
  Administered 2017-04-19: 300 mg via ORAL
  Filled 2017-04-19: qty 1

## 2017-04-19 MED ORDER — CLOPIDOGREL BISULFATE 75 MG PO TABS
75.0000 mg | ORAL_TABLET | Freq: Every day | ORAL | Status: DC
  Administered 2017-04-20 – 2017-04-21 (×2): 75 mg via ORAL
  Filled 2017-04-19 (×2): qty 1

## 2017-04-19 MED ORDER — SODIUM CHLORIDE 0.9% FLUSH
3.0000 mL | Freq: Two times a day (BID) | INTRAVENOUS | Status: DC
  Administered 2017-04-19: 3 mL via INTRAVENOUS

## 2017-04-19 MED ORDER — SODIUM CHLORIDE 0.9 % WEIGHT BASED INFUSION
1.0000 mL/kg/h | INTRAVENOUS | Status: DC
  Administered 2017-04-20: 1 mL/kg/h via INTRAVENOUS

## 2017-04-19 NOTE — Progress Notes (Signed)
Pt tx to 6E 04 after report called to recving RN, vss on arrival, report confirmed at bs.

## 2017-04-19 NOTE — Care Management Note (Signed)
Case Management Note  Patient Details  Name: GRADY MOHABIR MRN: [id number] Date of Birth: 1961/01/04  Subjective/Objective:    Pt admitted with chest pain - while being evaluated for CABG pt became agitated (ETOH withdrawal)  and required Precedex drip               Action/Plan:  Pt is from home with girlfriend.  Pt will need clinic follow up appt at discharge and possibly [place].  CSW consulted for substance abuse.   Expected Discharge Date:                  Expected Discharge Plan:  Home/Self Care  In-House Referral:     Discharge planning Services  CM Consult  Post Acute Care Choice:    Choice offered to:     DME Arranged:    DME Agency:     HH Arranged:    HH Agency:     Status of Service:     If discussed at [employer] of Stay Meetings, dates discussed:    Additional Comments:  Maryclare Labrador, RN 04/19/2017, 2:53 PM

## 2017-04-19 NOTE — Progress Notes (Signed)
PROGRESS NOTE    Richard Calhoun  WER:[id number] DOB: 01-24-1961 DOA: 04/14/2017 PCP: Alfonse Spruce, FNP   Brief Narrative:  Richard Calhoun is a 56 y.o. male with medical history significant of HTN, HLD, Significant EtOH Abuse, Paroxysmal Tachycardia. GERD, Hx of Tobacco Abuse and other comorbids who presented to [facility] from his PCP office with a CC of Chest Pain with Exertion that started on Saturday. Patient states he has had chronic dizziness which was improved after he was placed on a Beta Blocker. He states he was dizzy all day Saturday and developed Left sided CP after he exerted himself taking out the garbage. States the CP was a dull 2/10 in intensity with no radiation but he felt some numbness on his left shoulder the next day. Patient states CP lasted about 5 minutes and went away after resting. Patient denied any N/V but stated he thinks he may have had some Diaphoresis and was SOB after. Patient also states that the CP is intermittent with Exertion and did not have any today when walked to the bus stop from his place to go to his PCP appointment. When asked about the CP the patient currently does not have any and states it had felt like a toothache. TRH was called to admit for Chest Pain with Exertion and Cardiology was consulted to see the patient. Patient was cathed and showed Severe 2 vessel disease. Cardiothoracic Surgery consulted and patient was not currently a candidate for CABG as patient started going through EtOH withdrawals the day after he was Catheterized. He was transferred to the ICU due to Severe withdrawal requiring Precedex on 04/16/17. He was stabilized and transferred to [facility] 10/2. Now that patient is Stable plan is for LAD PCI with Atherectomy in AM.   Assessment & Plan:   Principal Problem:   Coronary artery disease involving native coronary artery of native heart with angina pectoris (HCC) Active Problems:   Hyperlipidemia   EtOH dependence ([place])   Essential  hypertension   GERD   Hypertensive heart disease without heart failure   Chest pain   Hypokalemia   Tobacco abuse   Chest pain on exertion   Acute hyperactive alcohol withdrawal delirium (HCC)   Hand edema   Hypophosphatemia  CAD with severe occlusive coronary disease -Cardiology discussing option between CABG versus LAD atherectomy followed by stenting + medical therapy for right coronary.  -Continue Lipitor, Aspirin, Imdur, losartan, metoprolol IV, Atorvastatin -Cardiology planning on Heart Cath tomorrow with PCI and Atherectomy to LAD -Pateitn being loaded with Plavix 300 mg po daily then 75 mg tomorrow -ASA reduced to 81 by Cards -Cardiothoracic Surgery Evaluated   Acute ETOH withdrawal with delirium -Off precedex, tolerating well and no longer requiring restraints  -Librium 10 mg TID (started 9/30), taper, IV Ativan 2 mg q1hr prn- no doses for >19 hrs  -C/w Folic Acid, Thiamine, and MVI   HTN -Controlled  HLD -C/w Statin  Hypokalemia -Replete with 40 mEQ po BID -Repeat CMP in AM  Hypophosphatemia  -Replete -Repeat Phos level in AM  H/o smoking, suspect COPD, No active disease -Stable, follow respiratory status  -Will need Pulmonary Follow up As an outpatient  GERD -C/w Protonix ang GI Cocktail  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 Potential for injury during agitation, restraint use -Check Ultrasound of Left Arm  DVT prophylaxis: Lovenox 40 mg sq Daily Code Status: FULL CODE Family Communication: No family present at bedside Disposition Plan: Remain Inpatient for Cardiac Catheterization in AM   Consultants:   PCCM  Transfer  Cardiology   Procedures:   Catheterization Conclusion    Severe 2 vessel coronary disease with calcific total occlusion of the mid right coronary over a long segment.  Severe calcified coronary disease involving the proximal to mid LAD with 60-80% stenosis depending upon review and FFR positive at 0.74 beyond the second diagonal.  Luminal irregularities with less than 50% stenosis in the circumflex.  Normal left ventricular systolic function. EF greater than 55%. Mildly elevated LVEDP, 18 mmHg.  RECOMMENDATIONS:   Very difficult clinical situation. I believe the patient is alcohol dependent. He has no insurance. He is reluctant to have any care because of expense. According to his girlfriend he may not be reliable with chronic medication compliance.  HEART TEAM APPROACH; discuss CABG versus LAD atherectomy followed by stenting with medical therapy for right coronary. Chronic medical therapy without revascularization is probably not a good option due to the large region at risk should the LAD close.  Aggressive blood pressure control and lipid management.  CIWA    Antimicrobials:  Antibiotics Given (last 72 hours)    None     Subjective: Seen and examined and felt better. No CP. Wanting to know what the plan was and when he could go home. NO SOB. Ambulated well with Nursing.   Objective: Vitals:   04/19/17 1119 04/19/17 1534 04/19/17 1600 04/19/17 1751  BP:   97/67 126/75  Pulse:      Resp:   19   Temp: 98 F (36.7 C) 98.1 F (36.7 C)  98.2 F (36.8 C)  TempSrc: Oral Oral  Oral  SpO2:    100%  Weight:      Height:        Intake/Output Summary (Last 24 hours) at 04/19/17 1832 Last data filed at 04/19/17 1802  Gross per 24 hour  Intake              370 ml  Output             1850 ml  Net            -1480 ml   Filed Weights   04/14/17 1253 04/14/17 2300  Weight: 90.7 kg (200 lb) 93.4 kg (205 lb 12.8 oz)   Examination: Physical  Exam:  Constitutional: WN/WD obese Caucasian male NAD and appears calm and comfortable Eyes: Lids and conjunctivae normal, sclerae anicteric  ENMT: External Ears, Nose appear normal. Grossly normal hearing. Mucous membranes are moist. Neck: Appears normal, supple, no cervical masses, normal ROM, no appreciable thyromegaly, no JVD Respiratory: Clear to auscultation bilaterally, no wheezing, rales, rhonchi or crackles. Normal respiratory effort and patient is not tachypenic. No accessory muscle use.  Cardiovascular: RRR, no murmurs / rubs / gallops. S1 and S2 auscultated. Has Left arm swelling and right arm bruising Abdomen: Soft, non-tender, non-distended. No masses palpated. No appreciable hepatosplenomegaly. Bowel sounds positive x4.  GU: Deferred. Musculoskeletal: No clubbing / cyanosis of digits/nails. No joint deformity upper and lower extremities.  Skin: Has bruising on Right arm. No induration; Warm and dry.  Neurologic: CN 2-12 grossly intact with no focal deficits. Sensation intact. Romberg sign cerebellar reflexes not assessed.  Psychiatric:  Normal judgment and insight. Alert and oriented x 3. Normal mood and appropriate affect.   Data Reviewed: I have personally reviewed following labs and imaging studies  CBC:  Recent Labs Lab 04/14/17 1300 04/14/17 1657 04/14/17 2305 04/15/17 0554 04/18/17 0416 04/19/17 0247 04/19/17 0900  WBC 5.3  --  5.3 5.4 8.1 6.1  --   HGB 12.5* 11.6* 11.7* 11.8* 11.1* 10.8*  --   HCT 37.0* 34.0* 33.7* 35.1* 34.5* 32.1*  --   MCV 106.3*  --  105.6* 106.7* 106.2* 104.2*  --   PLT PLATELET CLUMPS NOTED ON SMEAR, UNABLE TO ESTIMATE  --  PLATELET CLUMPS NOTED ON SMEAR, UNABLE TO ESTIMATE PLATELET CLUMPS NOTED ON SMEAR, UNABLE TO ESTIMATE PLATELET CLUMPS NOTED ON SMEAR, UNABLE TO ESTIMATE PLATELET CLUMPS NOTED ON SMEAR, UNABLE TO ESTIMATE PLATELET CLUMPS NOTED ON SMEAR, UNABLE TO ESTIMATE   Basic Metabolic Panel:  Recent Labs Lab 04/15/17 0234  04/15/17 0554 04/16/17 0603 04/18/17 0416 04/18/17 2001 04/19/17 0247  NA  --  138 136 132* 133* 134*  K  --  3.4* 3.5 3.4* 3.6 3.4*  CL  --  107 106 106 105 108  CO2  --  22 21* 14* 19* 18*  GLUCOSE  --  91 115* 103* 122* 118*  BUN  --  17 14 13  22* 20  CREATININE  --  0.78 0.72 0.87 1.16 0.89  CALCIUM  --  9.1 8.7* 8.6* 8.6* 8.2*  MG 0.9*  --  1.2* 1.6*  --  2.3  PHOS 2.7  --   --   --   --  1.9*   GFR: Estimated Creatinine Clearance: 98.5 mL/min (by C-G formula based on SCr of 0.89 mg/dL). Liver Function Tests:  Recent Labs Lab 04/16/17 0603  AST 46*  ALT 35  ALKPHOS 88  BILITOT 0.9  PROT 6.7  ALBUMIN 3.5   No results for input(s): LIPASE, AMYLASE in the last 168 hours. No results for input(s): AMMONIA in the last 168 hours. Coagulation Profile:  Recent Labs Lab 04/15/17 0234  INR 1.16   Cardiac Enzymes:  Recent Labs Lab 04/14/17 2305 04/15/17 0234 04/15/17 0540  TROPONINI <0.03 <0.03 <0.03   BNP (last 3 results) No results for input(s): PROBNP in the last 8760 hours. HbA1C: No results for input(s): HGBA1C in the last 72 hours. CBG:  Recent Labs Lab 04/18/17 2012 04/19/17 0316 04/19/17 0748 04/19/17 1117 04/19/17 1533  GLUCAP 135* 117* 112* 126* 182*   Lipid Profile: No results for input(s): CHOL, HDL, LDLCALC, TRIG, CHOLHDL, LDLDIRECT in the last 72 hours. Thyroid Function Tests: No results for input(s): TSH, T4TOTAL, FREET4, T3FREE, THYROIDAB in the last 72 hours. Anemia Panel: No results for input(s): VITAMINB12, FOLATE, FERRITIN, TIBC, IRON, RETICCTPCT in the last 72 hours. Sepsis Labs: No results for input(s): PROCALCITON, LATICACIDVEN in the last 168 hours.  Recent Results (from the past 240 hour(s))  MRSA PCR Screening     Status: None   Collection Time: 04/16/17  5:58 AM  Result Value Ref Range Status   MRSA by PCR NEGATIVE NEGATIVE Final    Comment:        The GeneXpert MRSA Assay (FDA approved for NASAL specimens only), is  one component of a comprehensive MRSA colonization surveillance program. It is not intended to diagnose MRSA infection nor to guide or monitor treatment for MRSA infections.     Radiology Studies: Dg Shoulder Left  Result Date: 04/18/2017 CLINICAL DATA:  Left shoulder pain. EXAM: LEFT  SHOULDER - 2+ VIEW COMPARISON:  None. FINDINGS: There is no evidence of fracture or dislocation. There is no evidence of arthropathy or other focal bone abnormality. Soft tissues are unremarkable. IMPRESSION: Normal left shoulder. Electronically Signed   By: Marijo Conception, M.D.   On: 04/18/2017 14:19   Dg Hand Complete Left  Result Date: 04/18/2017 CLINICAL DATA:  Left hand pain. EXAM: LEFT HAND - COMPLETE 3+ VIEW COMPARISON:  None. FINDINGS: There is no evidence of fracture or dislocation. Narrowing and osteophyte formation is seen involving the first carpometacarpal joint. Soft tissues are unremarkable. IMPRESSION: Moderate osteoarthritis of first carpometacarpal joint. No acute abnormality seen the left hand. Electronically Signed   By: Marijo Conception, M.D.   On: 04/18/2017 14:20   Scheduled Meds: . [START ON 04/20/2017] aspirin EC  81 mg Oral Daily  . atorvastatin  80 mg Oral q1800  . chlordiazePOXIDE  10 mg Oral TID  . [START ON 04/20/2017] clopidogrel  75 mg Oral Daily  . enoxaparin (LOVENOX) injection  40 mg Subcutaneous Daily  . insulin aspart  2-6 Units Subcutaneous Q4H  . isosorbide mononitrate  30 mg Oral Daily  . losartan  50 mg Oral Daily  . metoprolol tartrate  25 mg Oral BID  . pantoprazole  40 mg Oral Daily  . potassium chloride  40 mEq Oral BID  . sodium chloride flush  3 mL Intravenous Q12H  . sodium chloride flush  3 mL Intravenous Q12H  . vitamin C  500 mg Oral Daily   Continuous Infusions: . sodium chloride Stopped (04/18/17 0600)  . sodium chloride    . [START ON 04/20/2017] sodium chloride     Followed by  . [START ON 04/20/2017] sodium chloride      LOS: 4 days   Kerney Elbe, DO [facility] Pager [phone]  If 7PM-7AM, please contact night-coverage [email or web address] Password [facility] 04/19/2017, 6:32 PM

## 2017-04-19 NOTE — Progress Notes (Signed)
Ambulated Pt, d/c all monitoring except tele to liberate pt.

## 2017-04-19 NOTE — Progress Notes (Addendum)
Progress Note  Patient Name: Richard Calhoun Date of Encounter: 04/19/2017  Primary Cardiologist: Tamala Julian  Subjective   No chest pain, feeling well this morning.   Inpatient Medications    Scheduled Meds: . aspirin EC  325 mg Oral Daily  . atorvastatin  80 mg Oral q1800  . chlordiazePOXIDE  10 mg Oral TID  . enoxaparin (LOVENOX) injection  40 mg Subcutaneous Daily  . insulin aspart  2-6 Units Subcutaneous Q4H  . isosorbide mononitrate  30 mg Oral Daily  . losartan  50 mg Oral Daily  . metoprolol tartrate  25 mg Oral BID  . pantoprazole  40 mg Oral Daily  . potassium chloride  40 mEq Oral BID  . sodium chloride flush  3 mL Intravenous Q12H  . vitamin C  500 mg Oral Daily   Continuous Infusions: . sodium chloride Stopped (04/18/17 0600)  . potassium PHOSPHATE IVPB (mmol) 20 mmol (04/19/17 1027)   PRN Meds: sodium chloride, acetaminophen, gi cocktail, HYDROcodone-acetaminophen, LORazepam, morphine injection, ondansetron (ZOFRAN) IV, sodium chloride flush   Vital Signs    Vitals:   04/19/17 0749 04/19/17 0800 04/19/17 1037 04/19/17 1119  BP:   137/85   Pulse:   (!) 102   Resp:  15    Temp: 99 F (37.2 C)   98 F (36.7 C)  TempSrc: Oral   Oral  SpO2:      Weight:      Height:        Intake/Output Summary (Last 24 hours) at 04/19/17 1431 Last data filed at 04/19/17 1046  Gross per 24 hour  Intake               10 ml  Output             2275 ml  Net            -2265 ml   Filed Weights   04/14/17 1253 04/14/17 2300  Weight: 200 lb (90.7 kg) 205 lb 12.8 oz (93.4 kg)    Telemetry    SR - Personally Reviewed  ECG    N/a - Personally Reviewed  Physical Exam   General: Well developed, well nourished, male appearing in no acute distress. Head: Normocephalic, atraumatic.  Neck: Supple without bruits, JVD. Lungs:  Resp regular and unlabored, CTA. Heart: RRR, S1, S2, no S3, S4, or murmur; no rub. Abdomen: Soft, non-tender, non-distended with normoactive  bowel sounds. No hepatomegaly. No rebound/guarding. No obvious abdominal masses. Extremities: No clubbing, cyanosis, LUE edema. Distal pedal pulses are 2+ bilaterally. Neuro: Alert and oriented X 3. Moves all extremities spontaneously. Psych: Normal affect.  Labs    Chemistry Recent Labs Lab 04/16/17 0603 04/18/17 0416 04/18/17 2001 04/19/17 0247  NA 136 132* 133* 134*  K 3.5 3.4* 3.6 3.4*  CL 106 106 105 108  CO2 21* 14* 19* 18*  GLUCOSE 115* 103* 122* 118*  BUN 14 13 22* 20  CREATININE 0.72 0.87 1.16 0.89  CALCIUM 8.7* 8.6* 8.6* 8.2*  PROT 6.7  --   --   --   ALBUMIN 3.5  --   --   --   AST 46*  --   --   --   ALT 35  --   --   --   ALKPHOS 88  --   --   --   BILITOT 0.9  --   --   --   GFRNONAA >60 >60 >60 >60  GFRAA >60 >60 >60 >  60  ANIONGAP 9 12 9 8      Hematology Recent Labs Lab 04/15/17 0554 04/18/17 0416 04/19/17 0247 04/19/17 0900  WBC 5.4 8.1 6.1  --   RBC 3.29* 3.25* 3.08*  --   HGB 11.8* 11.1* 10.8*  --   HCT 35.1* 34.5* 32.1*  --   MCV 106.7* 106.2* 104.2*  --   MCH 35.9* 34.2* 35.1*  --   MCHC 33.6 32.2 33.6  --   RDW 13.2 13.2 13.1  --   PLT PLATELET CLUMPS NOTED ON SMEAR, UNABLE TO ESTIMATE PLATELET CLUMPS NOTED ON SMEAR, UNABLE TO ESTIMATE PLATELET CLUMPS NOTED ON SMEAR, UNABLE TO ESTIMATE PLATELET CLUMPS NOTED ON SMEAR, UNABLE TO ESTIMATE    Cardiac Enzymes Recent Labs Lab 04/14/17 2305 04/15/17 0234 04/15/17 0540  TROPONINI <0.03 <0.03 <0.03    Recent Labs Lab 04/14/17 1313  TROPIPOC 0.00     BNPNo results for input(s): BNP, PROBNP in the last 168 hours.   DDimer No results for input(s): DDIMER in the last 168 hours.    Radiology    Dg Shoulder Left  Result Date: 04/18/2017 CLINICAL DATA:  Left shoulder pain. EXAM: LEFT SHOULDER - 2+ VIEW COMPARISON:  None. FINDINGS: There is no evidence of fracture or dislocation. There is no evidence of arthropathy or other focal bone abnormality. Soft tissues are unremarkable. IMPRESSION:  Normal left shoulder. Electronically Signed   By: Marijo Conception, M.D.   On: 04/18/2017 14:19   Dg Hand Complete Left  Result Date: 04/18/2017 CLINICAL DATA:  Left hand pain. EXAM: LEFT HAND - COMPLETE 3+ VIEW COMPARISON:  None. FINDINGS: There is no evidence of fracture or dislocation. Narrowing and osteophyte formation is seen involving the first carpometacarpal joint. Soft tissues are unremarkable. IMPRESSION: Moderate osteoarthritis of first carpometacarpal joint. No acute abnormality seen the left hand. Electronically Signed   By: Marijo Conception, M.D.   On: 04/18/2017 14:20    Cardiac Studies   Cath: 04/15/17  Conclusion    Severe 2 vessel coronary disease with calcific total occlusion of the mid right coronary over a long segment.  Severe calcified coronary disease involving the proximal to mid LAD with 60-80% stenosis depending upon review and FFR positive at 0.74 beyond the second diagonal.  Luminal irregularities with less than 50% stenosis in the circumflex.  Normal left ventricular systolic function. EF greater than 55%. Mildly elevated LVEDP, 18 mmHg.  RECOMMENDATIONS:   Very difficult clinical situation. I believe the patient is alcohol dependent. He has no insurance. He is reluctant to have any care because of expense. According to his girlfriend he may not be reliable with chronic medication compliance.  HEART TEAM APPROACH; discuss CABG versus LAD atherectomy followed by stenting with medical therapy for right coronary. Chronic medical therapy without revascularization is probably not a good option due to the large region at risk should the LAD close.  Aggressive blood pressure control and lipid management.  CIWA   Patient Profile     56 y.o. male with PMH of ETOH use, HL, HTN and tobacco use who presented with chest pain. Underwent cath with 2v disease. Planned for PCI with atherectomy.   Assessment & Plan    1. CAD: Noted to have 2v disease on cath from  9/28. Now stable from withdrawal perspective. Will plan for cath tomorrow with PCI and atherectomy to the LAD.  -- load with plavix 300mg  today, then 75mg  tomorrow. Reduce ASA to 81mg .  -- The patient understands that  risks included but are not limited to stroke (1 in 1000), death (1 in 69), kidney failure [usually temporary] (1 in 500), bleeding (1 in 200), allergic reaction [possibly serious] (1 in 200).  -- continue medical therapy with statin, imdur, BB, ARB  2. HTN: controlled   3. HL: on statin  4. Hypokalemia: Replete per primary  5. Anemia: Hgb stable  -- follow CBC  6. ETOH withdrawal: per primary  Addendum: noted thus far unable to obtain platelet count. Discussed with hematology. Will order finger stick CBC/heparin tube collect. Discussed with lab regarding modification.    Signed, Reino Bellis, NP  04/19/2017, 2:31 PM  Pager # [phone]   For questions or updates, please contact [place] Please consult [email or web address] for contact info under Cardiology/STEMI. Daytime calls, contact the Day Call APP (6a-8a) or assigned team (Teams A-D) provider (7:30a - 5p). All other daytime calls (7:30-5p), contact the Card Master @ [phone].   Nighttime calls, contact the assigned APP (5p-8p) or MD (6:30p-8p). Overnight calls (8p-6a), contact the on call Fellow @ [phone].  I have examined the patient and reviewed assessment and plan and discussed with patient.  Agree with above as stated.  I personally reviewed his cath films and explained the procedure.  He reported severe pain with right radial approach, so femormal may be preferable.  WOuld recommend SYNERGY stent to minimize duration of antiplatelet therapy.  I stressed the importance of DAPT for 6-12 months post procedure.  He has financial issues and is concerned if he can afford medicines.  He will need a case manager consult.    Avoid tobacco.  Swollen left arm.  Eval for DVT.     Larae Grooms

## 2017-04-19 NOTE — Progress Notes (Signed)
Preliminary results by tech - Right Upper Ext. Venous Duplex Completed. Negative for deep and superficial vein thrombosis. Richard Calhoun, BS, RDMS, RVT

## 2017-04-19 NOTE — Evaluation (Signed)
Physical Therapy Evaluation Patient Details Name: Richard Calhoun MRN: [id number] DOB: 06-Dec-1960 Today's Date: 04/19/2017   History of Present Illness  56 yr old male with PMHx of HTN, CAD, HLD, recently admitted for unstable angina found to have severe 2 vessel disease on 04/15/17 Cardiac cath and was being considered for CABG vs PCI. He became agitated with HTN, tachycardia c/w with acute alcohol withdrawal which required precedex gtt for adequate control.   Clinical Impression  Pt admitted with above diagnosis. Pt currently with functional limitations due to the deficits listed below (see PT Problem List). Pt ambulated 600' with RW and min A for 1 loss of balance. Expect he will progress well as he is up more.  Pt will benefit from skilled PT to increase their independence and safety with mobility to allow discharge to the venue listed below.       Follow Up Recommendations No PT follow up    Equipment Recommendations  None recommended by PT    Recommendations for Other Services       Precautions / Restrictions Precautions Precautions: None Restrictions Weight Bearing Restrictions: No      Mobility  Bed Mobility Overal bed mobility: Needs Assistance Bed Mobility: Supine to Sit     Supine to sit: Supervision     General bed mobility comments: pt able to perform independently but very impulsive, trying to climb out of bed with rail up and cords pulling on him  Transfers Overall transfer level: Needs assistance Equipment used: Rolling walker (2 wheeled) Transfers: Sit to/from Stand Sit to Stand: Supervision         General transfer comment: vc's for hand placement. Pt stood from bed and toilet without physical assist but increased time needed  Ambulation/Gait Ambulation/Gait assistance: Min assist Ambulation Distance (Feet): 600 Feet Assistive device: Rolling walker (2 wheeled) Gait Pattern/deviations: Step-through pattern;Antalgic Gait velocity: decreased Gait  velocity interpretation: Below normal speed for age/gender General Gait Details: pt's feet painful with gait initiation but improved with distance. He had ambulated in the morning with RN without AD and fatigued quickly so used RW for energy conservation. Slow, consistent gait. I posterior LOB with min A to correct  Stairs            Wheelchair Mobility    Modified Rankin (Stroke Patients Only)       Balance Overall balance assessment: Needs assistance Sitting-balance support: No upper extremity supported Sitting balance-Leahy Scale: Good     Standing balance support: No upper extremity supported Standing balance-Leahy Scale: Fair Standing balance comment: limited by B foot pain                             Pertinent Vitals/Pain Pain Assessment: Faces Faces Pain Scale: Hurts little more Pain Location: neck and feet Pain Descriptors / Indicators: Sore (stiff) Pain Intervention(s): Monitored during session    Home Living Family/patient expects to be discharged to:: Private residence Living Arrangements: Spouse/significant other Available Help at Discharge: Family;Available PRN/intermittently Type of Home: House Home Access: Level entry     Home Layout: Two level Home Equipment: None Additional Comments: pt lives with his girlfriend who works. He has not been able to find work past 4 yrs per his report. Lives in a townhouse with bed/ bath upstairs    Prior Function Level of Independence: Independent               Hand Dominance  Extremity/Trunk Assessment   Upper Extremity Assessment Upper Extremity Assessment: Overall WFL for tasks assessed    Lower Extremity Assessment Lower Extremity Assessment: Overall WFL for tasks assessed    Cervical / Trunk Assessment Cervical / Trunk Assessment: Normal  Communication   Communication: No difficulties  Cognition Arousal/Alertness: Awake/alert Behavior During Therapy: Impulsive Overall  Cognitive Status: Within Functional Limits for tasks assessed                                        General Comments General comments (skin integrity, edema, etc.): HR 102 bpm, BP stable, O2 sats stable    Exercises     Assessment/Plan    PT Assessment Patient needs continued PT services  PT Problem List Decreased activity tolerance;Decreased balance;Decreased mobility;Pain;Decreased knowledge of use of DME;Decreased knowledge of precautions       PT Treatment Interventions DME instruction;Gait training;Stair training;Functional mobility training;Therapeutic activities;Therapeutic exercise;Balance training;Patient/family education    PT Goals (Current goals can be found in the Care Plan section)  Acute Rehab PT Goals Patient Stated Goal: go home PT Goal Formulation: With patient Time For Goal Achievement: 05/03/17 Potential to Achieve Goals: Good    Frequency Min 3X/week   Barriers to discharge Inaccessible home environment flight of stairs to bedroom but expect he will be able to handle by time of d/c    Co-evaluation               AM-PAC PT "6 Clicks" Daily Activity  Outcome Measure Difficulty turning over in bed (including adjusting bedclothes, sheets and blankets)?: None Difficulty moving from lying on back to sitting on the side of the bed? : None Difficulty sitting down on and standing up from a chair with arms (e.g., wheelchair, bedside commode, etc,.)?: None Help needed moving to and from a bed to chair (including a wheelchair)?: A Little Help needed walking in hospital room?: A Little Help needed climbing 3-5 steps with a railing? : A Little 6 Click Score: 21    End of Session Equipment Utilized During Treatment: Gait belt Activity Tolerance: Patient tolerated treatment well Patient left: in chair;with call bell/phone within reach Nurse Communication: Mobility status PT Visit Diagnosis: Unsteadiness on feet (R26.81);Difficulty in  walking, not elsewhere classified (R26.2);Pain Pain - part of body: Ankle and joints of foot (both)    Time: 7371-0626 PT Time Calculation (min) (ACUTE ONLY): 36 min   Charges:   PT Evaluation $PT Eval Moderate Complexity: 1 Mod PT Treatments $Gait Training: 8-22 mins   PT G Codes:        Superior  Abbott 04/19/2017, 1:14 PM

## 2017-04-20 ENCOUNTER — Encounter (HOSPITAL_COMMUNITY): Payer: Self-pay | Admitting: Cardiovascular Disease

## 2017-04-20 ENCOUNTER — Inpatient Hospital Stay (HOSPITAL_COMMUNITY): Payer: Self-pay

## 2017-04-20 ENCOUNTER — Inpatient Hospital Stay (HOSPITAL_COMMUNITY)
Admission: EM | Disposition: A | Discharge status: Home / Self Care | Payer: Self-pay | Source: Home / Self Care | Attending: Emergency Medicine

## 2017-04-20 ENCOUNTER — Other Ambulatory Visit: Payer: Self-pay

## 2017-04-20 ENCOUNTER — Ambulatory Visit: Payer: Self-pay

## 2017-04-20 HISTORY — PX: CORONARY STENT INTERVENTION: CATH118234

## 2017-04-20 HISTORY — PX: CORONARY ATHERECTOMY: CATH118238

## 2017-04-20 LAB — COMPREHENSIVE METABOLIC PANEL
ALT: 34 U/L (ref 17–63)
ANION GAP: 8 (ref 5–15)
AST: 43 U/L — ABNORMAL HIGH (ref 15–41)
Albumin: 3 g/dL — ABNORMAL LOW (ref 3.5–5.0)
Alkaline Phosphatase: 103 U/L (ref 38–126)
BILIRUBIN TOTAL: 0.9 mg/dL (ref 0.3–1.2)
BUN: 23 mg/dL — ABNORMAL HIGH (ref 6–20)
CHLORIDE: 110 mmol/L (ref 101–111)
CO2: 17 mmol/L — ABNORMAL LOW (ref 22–32)
Calcium: 8.7 mg/dL — ABNORMAL LOW (ref 8.9–10.3)
Creatinine, Ser: 0.92 mg/dL (ref 0.61–1.24)
GFR calc Af Amer: >60 mL/min (ref >60)
Glucose, Bld: 112 mg/dL — ABNORMAL HIGH (ref 65–99)
POTASSIUM: 3.9 mmol/L (ref 3.5–5.1)
Sodium: 135 mmol/L (ref 135–145)
TOTAL PROTEIN: 7 g/dL (ref 6.5–8.1)

## 2017-04-20 LAB — GLUCOSE, CAPILLARY
GLUCOSE-CAPILLARY: 112 mg/dL — AB (ref 65–99)
GLUCOSE-CAPILLARY: 113 mg/dL — AB (ref 65–99)
Glucose-Capillary: 112 mg/dL — ABNORMAL HIGH (ref 65–99)
Glucose-Capillary: 88 mg/dL (ref 65–99)
Glucose-Capillary: 104 mg/dL — ABNORMAL HIGH (ref 65–99)
Glucose-Capillary: 106 mg/dL — ABNORMAL HIGH (ref 65–99)

## 2017-04-20 LAB — CBC WITH DIFFERENTIAL/PLATELET
BASOS ABS: 0 10*3/uL (ref 0.0–0.1)
BASOS PCT: 0 %
EOS PCT: 1 %
Eosinophils Absolute: 0.1 10*3/uL (ref 0.0–0.7)
HEMATOCRIT: 30.4 % — AB (ref 39.0–52.0)
Hemoglobin: 10.2 g/dL — ABNORMAL LOW (ref 13.0–17.0)
Lymphocytes Relative: 12 %
Lymphs Abs: 0.7 10*3/uL (ref 0.7–4.0)
MCH: 35.4 pg — ABNORMAL HIGH (ref 26.0–34.0)
MCHC: 33.6 g/dL (ref 30.0–36.0)
MCV: 105.6 fL — AB (ref 78.0–100.0)
MONO ABS: 0.7 10*3/uL (ref 0.1–1.0)
MONOS PCT: 13 %
NEUTROS PCT: 75 %
Neutro Abs: 4.3 10*3/uL (ref 1.7–7.7)
PLATELETS: UNDETERMINED 10*3/uL (ref 150–400)
RBC: 2.88 MIL/uL — ABNORMAL LOW (ref 4.22–5.81)
RDW: 13.4 % (ref 11.5–15.5)
WBC: 5.7 10*3/uL (ref 4.0–10.5)

## 2017-04-20 LAB — URIC ACID: Uric Acid, Serum: 4.4 mg/dL (ref 4.4–7.6)

## 2017-04-20 LAB — PHOSPHORUS: PHOSPHORUS: 2.8 mg/dL (ref 2.5–4.6)

## 2017-04-20 LAB — POCT ACTIVATED CLOTTING TIME: Activated Clotting Time: 433 seconds

## 2017-04-20 LAB — MAGNESIUM: MAGNESIUM: 1.8 mg/dL (ref 1.7–2.4)

## 2017-04-20 SURGERY — CORONARY ATHERECTOMY
Anesthesia: LOCAL

## 2017-04-20 MED ORDER — FENTANYL CITRATE (PF) 100 MCG/2ML IJ SOLN
INTRAMUSCULAR | Status: DC | PRN
  Administered 2017-04-20: 50 ug via INTRAVENOUS

## 2017-04-20 MED ORDER — CLOPIDOGREL BISULFATE 300 MG PO TABS
ORAL_TABLET | ORAL | Status: DC | PRN
  Administered 2017-04-20: 300 mg via ORAL

## 2017-04-20 MED ORDER — NITROGLYCERIN 1 MG/10 ML FOR IR/CATH LAB
INTRA_ARTERIAL | Status: AC
  Filled 2017-04-20: qty 10

## 2017-04-20 MED ORDER — LIDOCAINE HCL 2 % IJ SOLN
INTRAMUSCULAR | Status: AC
  Filled 2017-04-20: qty 10

## 2017-04-20 MED ORDER — SODIUM CHLORIDE 0.9 % IV SOLN
INTRAVENOUS | Status: DC | PRN
  Administered 2017-04-20: 1.75 mg/kg/h via INTRAVENOUS

## 2017-04-20 MED ORDER — MIDAZOLAM HCL 2 MG/2ML IJ SOLN
INTRAMUSCULAR | Status: DC | PRN
  Administered 2017-04-20: 1 mg via INTRAVENOUS

## 2017-04-20 MED ORDER — SODIUM CHLORIDE 0.9% FLUSH
3.0000 mL | Freq: Two times a day (BID) | INTRAVENOUS | Status: DC
  Administered 2017-04-20 – 2017-04-21 (×2): 3 mL via INTRAVENOUS

## 2017-04-20 MED ORDER — IOPAMIDOL (ISOVUE-370) INJECTION 76%
INTRAVENOUS | Status: AC
  Filled 2017-04-20: qty 100

## 2017-04-20 MED ORDER — IOPAMIDOL (ISOVUE-370) INJECTION 76%
INTRAVENOUS | Status: DC | PRN
  Administered 2017-04-20: 130 mL via INTRAVENOUS

## 2017-04-20 MED ORDER — SODIUM CHLORIDE 0.9 % IV SOLN
INTRAVENOUS | Status: AC | PRN
  Administered 2017-04-20: 20 mL via INTRAVENOUS

## 2017-04-20 MED ORDER — ATROPINE SULFATE 1 MG/10ML IJ SOSY
PREFILLED_SYRINGE | INTRAMUSCULAR | Status: AC
  Filled 2017-04-20: qty 10

## 2017-04-20 MED ORDER — HEPARIN (PORCINE) IN NACL 2-0.9 UNIT/ML-% IJ SOLN
INTRAMUSCULAR | Status: AC
  Filled 2017-04-20: qty 500

## 2017-04-20 MED ORDER — BIVALIRUDIN BOLUS VIA INFUSION - CUPID
INTRAVENOUS | Status: DC | PRN
  Administered 2017-04-20: 46.7 mg via INTRAVENOUS

## 2017-04-20 MED ORDER — BIVALIRUDIN TRIFLUOROACETATE 250 MG IV SOLR
INTRAVENOUS | Status: AC
  Filled 2017-04-20: qty 250

## 2017-04-20 MED ORDER — MIDAZOLAM HCL 2 MG/2ML IJ SOLN
INTRAMUSCULAR | Status: AC
  Filled 2017-04-20: qty 2

## 2017-04-20 MED ORDER — SODIUM CHLORIDE 0.9 % IV SOLN
INTRAVENOUS | Status: AC
  Administered 2017-04-20: 16:00:00 via INTRAVENOUS

## 2017-04-20 MED ORDER — FENTANYL CITRATE (PF) 100 MCG/2ML IJ SOLN
INTRAMUSCULAR | Status: AC
  Filled 2017-04-20: qty 2

## 2017-04-20 MED ORDER — SODIUM CHLORIDE 0.9% FLUSH: 3.0000 mL | INTRAVENOUS | Status: DC | PRN

## 2017-04-20 MED ORDER — IOPAMIDOL (ISOVUE-370) INJECTION 76%
INTRAVENOUS | Status: AC
  Filled 2017-04-20: qty 50

## 2017-04-20 MED ORDER — LABETALOL HCL 5 MG/ML IV SOLN: 10.0000 mg | INTRAVENOUS | Status: AC | PRN

## 2017-04-20 MED ORDER — ANGIOPLASTY BOOK
Freq: Once | Status: AC
  Administered 2017-04-21: 1
  Filled 2017-04-20: qty 1

## 2017-04-20 MED ORDER — HEPARIN (PORCINE) IN NACL 2-0.9 UNIT/ML-% IJ SOLN
INTRAMUSCULAR | Status: AC | PRN
  Administered 2017-04-20: 1000 mL

## 2017-04-20 MED ORDER — SODIUM CHLORIDE 0.9 % IV SOLN: 250.0000 mL | INTRAVENOUS | Status: DC | PRN

## 2017-04-20 MED ORDER — NITROGLYCERIN 1 MG/10 ML FOR IR/CATH LAB
INTRA_ARTERIAL | Status: DC | PRN
  Administered 2017-04-20 (×3): 200 ug via INTRACORONARY

## 2017-04-20 MED ORDER — LOSARTAN POTASSIUM 50 MG PO TABS
100.0000 mg | ORAL_TABLET | Freq: Every day | ORAL | Status: DC
  Administered 2017-04-20 – 2017-04-21 (×2): 100 mg via ORAL
  Filled 2017-04-20 (×2): qty 2

## 2017-04-20 MED ORDER — CLOPIDOGREL BISULFATE 300 MG PO TABS
ORAL_TABLET | ORAL | Status: AC
  Filled 2017-04-20: qty 1

## 2017-04-20 MED ORDER — LIDOCAINE HCL (PF) 1 % IJ SOLN
INTRAMUSCULAR | Status: DC | PRN
  Administered 2017-04-20: 15 mL

## 2017-04-20 SURGICAL SUPPLY — 18 items
BALLN SAPPHIRE 2.75X15 (BALLOONS) ×2
BALLN SAPPHIRE ~~LOC~~ 3.25X18 (BALLOONS) ×2 IMPLANT
BALLOON SAPPHIRE 2.75X15 (BALLOONS) ×1 IMPLANT
CATH VISTA GUIDE 6FR XBLAD4 (CATHETERS) ×2 IMPLANT
CROWN DIAMONDBACK CLASSIC 1.25 (BURR) ×2 IMPLANT
ELECT DEFIB PAD ADLT CADENCE (PAD) ×2 IMPLANT
GUIDEWIRE INQWIRE 1.5J.035X260 (WIRE) ×1 IMPLANT
INQWIRE 1.5J .035X260CM (WIRE) ×2
KIT ENCORE 26 ADVANTAGE (KITS) ×4 IMPLANT
KIT HEART LEFT (KITS) ×2 IMPLANT
LUBRICANT VIPERSLIDE CORONARY (MISCELLANEOUS) ×2 IMPLANT
PACK CARDIAC CATHETERIZATION (CUSTOM PROCEDURE TRAY) ×2 IMPLANT
SHEATH PINNACLE 4F 10CM (SHEATH) ×2 IMPLANT
SHEATH PINNACLE 6F 10CM (SHEATH) ×2 IMPLANT
STENT SYNERGY DES 3X24 (Permanent Stent) ×2 IMPLANT
TRANSDUCER W/STOPCOCK (MISCELLANEOUS) ×2 IMPLANT
TUBING CIL FLEX 10 FLL-RA (TUBING) ×2 IMPLANT
WIRE VIPER ADVANCE COR .012TIP (WIRE) ×2 IMPLANT

## 2017-04-20 NOTE — Progress Notes (Signed)
PROGRESS NOTE    Richard Calhoun  YJE:[id number] DOB: 08-23-1960 DOA: 04/14/2017 PCP: Alfonse Spruce, FNP   Brief Narrative: 56 y.o.malewith medical history significant of HTN, HLD, Significant EtOH Abuse, Paroxysmal Tachycardia. GERD, Hx of Tobacco Abuse and other comorbids who presented to [facility] from his PCP office with a CC of Chest Pain with Exertion that started on Saturday. Patient states he has had chronic dizziness which was improved after he was placed on a Beta Blocker. He states he was dizzy all day Saturday and developed Left sided CP after he exerted himself taking out the garbage. States the CP was a dull 2/10 in intensity with no radiation but he felt some numbness on his left shoulder the next day. Patient states CP lasted about 5 minutes and went away after resting. Patient denied any N/V but stated he thinks he may have had some Diaphoresis and was SOB after. Patient also states that the CP is intermittent with Exertion and did not have any today when walked to the bus stop from his place to go to his PCP appointment. When asked about the CP the patient currently does not have any and states it had felt like a toothache. TR[facility] was called to admit for Chest Pain with Exertion and Cardiology was consulted to see the patient. Patient was cathed and showed Severe 2 vessel disease. Cardiothoracic Surgery consulted and patient was not currently a candidate for CABG as patient started going through EtOH withdrawals the day after he was Catheterized. He was transferred to the ICU due to Severe withdrawal requiring Precedex on 04/16/17. He was stabilized and transferred to [facility] 10/2.patient had LAD PCI Today.   Assessment & Plan:   Principal Problem:   Coronary artery disease involving native coronary artery of native heart with angina pectoris ([place]) Active Problems:   Hyperlipidemia   EtOH dependence ([place])   Essential hypertension   GERD   Hypertensive heart disease without  heart failure   Chest pain   Hypokalemia   Tobacco abuse   Chest pain on exertion   Acute hyperactive alcohol withdrawal delirium ([facility])   Hand edema   Hypophosphatemia  CAD with severe occlusive coronary disease -Cardiology discussing option between CABG versus LAD atherectomy followed by stenting + medical therapy for right coronary. -Continue Lipitor, Aspirin, Imdur, losartan, metoprolol IV, Atorvastatin -Cardiology planning on Heart Cath tomorrow with PCI and Atherectomy to LAD -Pateitn being loaded with Plavix 300 mg po daily then 75 mg tomorrow -ASA reduced to 81 by Card.  Acute ETOH withdrawal with delirium -Off precedex, tolerating well and no longerrequiring restraints  -Librium 10 mg TID     DVT prophylaxis:lovenox Code Status:full Family Communication:none Disposition Plan: home  Consultants: cardioloigy   Procedures: pci  Antimicrobials: none   Subjective:resting   Objective: Vitals:   04/20/17 0954 04/20/17 0959 04/20/17 1035 04/20/17 1100  BP:   132/89 (!) 141/89  Pulse: (!) 182 (!) 185 77 85  Resp: 10 (!) 0  11  Temp:    97.8 F (36.6 C)  TempSrc:    Oral  SpO2: (!) 0% (!) 0%  100%  Weight:      Height:        Intake/Output Summary (Last 24 hours) at 04/20/17 1445 Last data filed at 04/20/17 1211  Gross per 24 hour  Intake              540 ml  Output  2050 ml  Net            -1510 ml   Filed Weights   04/14/17 1253 04/14/17 2300  Weight: 90.7 kg (200 lb) 93.4 kg (205 lb 12.8 oz)    Examination:  General exam: Appears calm and comfortable  Respiratory system: Clear to auscultation. Respiratory effort normal. Cardiovascular system: S1 & S2 heard, RRR. No JVD, murmurs, rubs, gallops or clicks. No pedal edema. Gastrointestinal system: Abdomen is nondistended, soft and nontender. No organomegaly or masses felt. Normal bowel sounds heard. Central nervous system: Alert and oriented. No focal neurological deficits. Extremities:  Symmetric 5 x 5 power. Skin: No rashes, lesions or ulcers Psychiatry: Judgement and insight appear normal. Mood & affect appropriate.     Data Reviewed: I have personally reviewed following labs and imaging studies  CBC:  Recent Labs Lab 04/15/17 0554 04/18/17 0416 04/19/17 0247 04/19/17 0900 04/19/17 1845 04/20/17 0233  WBC 5.4 8.1 6.1  --  7.8 5.7  NEUTROABS  --   --   --   --   --  4.3  HGB 11.8* 11.1* 10.8*  --  11.5* 10.2*  HCT 35.1* 34.5* 32.1*  --  34.6* 30.4*  MCV 106.7* 106.2* 104.2*  --  104.8* 105.6*  PLT PLATELET CLUMPS NOTED ON SMEAR, UNABLE TO ESTIMATE PLATELET CLUMPS NOTED ON SMEAR, UNABLE TO ESTIMATE PLATELET CLUMPS NOTED ON SMEAR, UNABLE TO ESTIMATE PLATELET CLUMPS NOTED ON SMEAR, UNABLE TO ESTIMATE PLATELET CLUMPS NOTED ON SMEAR, UNABLE TO ESTIMATE PLATELET CLUMPS NOTED ON SMEAR, UNABLE TO ESTIMATE   Basic Metabolic Panel:  Recent Labs Lab 04/15/17 0234  04/16/17 0603 04/18/17 0416 04/18/17 2001 04/19/17 0247 04/20/17 0233  NA  --   < > 136 132* 133* 134* 135  K  --   < > 3.5 3.4* 3.6 3.4* 3.9  CL  --   < > 106 106 105 108 110  CO2  --   < > 21* 14* 19* 18* 17*  GLUCOSE  --   < > 115* 103* 122* 118* 112*  BUN  --   < > 14 13 22* 20 23*  CREATININE  --   < > 0.72 0.87 1.16 0.89 0.92  CALCIUM  --   < > 8.7* 8.6* 8.6* 8.2* 8.7*  MG 0.9*  --  1.2* 1.6*  --  2.3 1.8  PHOS 2.7  --   --   --   --  1.9* 2.8  < > = values in this interval not displayed. GFR: Estimated Creatinine Clearance: 95.3 mL/min (by C-G formula based on SCr of 0.92 mg/dL). Liver Function Tests:  Recent Labs Lab 04/16/17 0603 04/20/17 0233  AST 46* 43*  ALT 35 34  ALKPHOS 88 103  BILITOT 0.9 0.9  PROT 6.7 7.0  ALBUMIN 3.5 3.0*   No results for input(s): LIPASE, AMYLASE in the last 168 hours. No results for input(s): AMMONIA in the last 168 hours. Coagulation Profile:  Recent Labs Lab 04/15/17 0234  INR 1.16   Cardiac Enzymes:  Recent Labs Lab 04/14/17 2305  04/15/17 0234 04/15/17 0540  TROPONINI <0.03 <0.03 <0.03   BNP (last 3 results) No results for input(s): PROBNP in the last 8760 hours. HbA1C: No results for input(s): HGBA1C in the last 72 hours. CBG:  Recent Labs Lab 04/19/17 2050 04/19/17 2329 04/20/17 0517 04/20/17 0743 04/20/17 1206  GLUCAP 119* 107* 113* 112* 88   Lipid Profile: No results for input(s): CHOL, HDL, LDLCALC, TRIG, CHOLHDL, LDLDIRECT  in the last 72 hours. Thyroid Function Tests: No results for input(s): TSH, T4TOTAL, FREET4, T3FREE, THYROIDAB in the last 72 hours. Anemia Panel: No results for input(s): VITAMINB12, FOLATE, FERRITIN, TIBC, IRON, RETICCTPCT in the last 72 hours. Sepsis Labs: No results for input(s): PROCALCITON, LATICACIDVEN in the last 168 hours.  Recent Results (from the past 240 hour(s))  MRSA PCR Screening     Status: None   Collection Time: 04/16/17  5:58 AM  Result Value Ref Range Status   MRSA by PCR NEGATIVE NEGATIVE Final    Comment:        The GeneXpert MRSA Assay (FDA approved for NASAL specimens only), is one component of a comprehensive MRSA colonization surveillance program. It is not intended to diagnose MRSA infection nor to guide or monitor treatment for MRSA infections.          Radiology Studies: No results found.      Scheduled Meds: . aspirin EC  81 mg Oral Daily  . atorvastatin  80 mg Oral q1800  . chlordiazePOXIDE  10 mg Oral TID  . clopidogrel  75 mg Oral Daily  . enoxaparin (LOVENOX) injection  40 mg Subcutaneous Daily  . insulin aspart  2-6 Units Subcutaneous Q4H  . isosorbide mononitrate  30 mg Oral Daily  . [START ON 04/21/2017] losartan  100 mg Oral Daily  . metoprolol tartrate  25 mg Oral BID  . pantoprazole  40 mg Oral Daily  . potassium chloride  40 mEq Oral BID  . sodium chloride flush  3 mL Intravenous Q12H  . sodium chloride flush  3 mL Intravenous Q12H  . vitamin C  500 mg Oral Daily   Continuous Infusions: . sodium chloride  Stopped (04/18/17 0600)  . sodium chloride 75 mL/hr at 04/20/17 1030  . sodium chloride       LOS: 5 days      Georgette Shell, MD [facility]  If 7PM-7AM, please contact night-coverage [email or web address] Password [email or web address] 04/20/2017, 2:45 PM

## 2017-04-20 NOTE — Plan of Care (Signed)
Problem: Safety: Goal: Ability to remain free from injury will improve Outcome: Progressing Bed alarm maintained due to patient getting up by self. Call light instructions reinforced. Patient remains free from injury.   Problem: Physical Regulation: Goal: Ability to maintain clinical measurements within normal limits will improve Outcome: Not Progressing Patient stating the care team mentioned he may have gout due to his foot/joint pain. Beginning of shift patient ambulated in hall. During shift, patient up to bathroom. This morning, patient stated his right foot hurt enough he didn't feel like he could walk on it. Norco given for foot pain  Patient to go to cath this AM, cath fluids started. Patient is anxious about procedure, Ativan given x1. Not due again until 0730

## 2017-04-20 NOTE — Interval H&P Note (Signed)
Cath Lab Visit (complete for each Cath Lab visit)  Clinical Evaluation Leading to the Procedure:   ACS: Yes.    Non-ACS:   n/a     History and Physical Interval Note:  04/20/2017 8:37 AM  Richard Calhoun  has presented today for surgery, with the diagnosis of cad  The various methods of treatment have been discussed with the patient and family. After consideration of risks, benefits and other options for treatment, the patient has consented to  Procedure(s): CORONARY ATHERECTOMY (N/A) as a surgical intervention .  The patient's history has been reviewed, patient examined, no change in status, stable for surgery.  I have reviewed the patient's chart and labs.  Questions were answered to the patient's satisfaction.     [name]

## 2017-04-20 NOTE — H&P (View-Only) (Signed)
Progress Note  Patient Name: Richard Calhoun Date of Encounter: 04/19/2017  Primary Cardiologist: Tamala Julian  Subjective   No chest pain, feeling well this morning.   Inpatient Medications    Scheduled Meds: . aspirin EC  325 mg Oral Daily  . atorvastatin  80 mg Oral q1800  . chlordiazePOXIDE  10 mg Oral TID  . enoxaparin (LOVENOX) injection  40 mg Subcutaneous Daily  . insulin aspart  2-6 Units Subcutaneous Q4H  . isosorbide mononitrate  30 mg Oral Daily  . losartan  50 mg Oral Daily  . metoprolol tartrate  25 mg Oral BID  . pantoprazole  40 mg Oral Daily  . potassium chloride  40 mEq Oral BID  . sodium chloride flush  3 mL Intravenous Q12H  . vitamin C  500 mg Oral Daily   Continuous Infusions: . sodium chloride Stopped (04/18/17 0600)  . potassium PHOSPHATE IVPB (mmol) 20 mmol (04/19/17 1027)   PRN Meds: sodium chloride, acetaminophen, gi cocktail, HYDROcodone-acetaminophen, LORazepam, morphine injection, ondansetron (ZOFRAN) IV, sodium chloride flush   Vital Signs    Vitals:   04/19/17 0749 04/19/17 0800 04/19/17 1037 04/19/17 1119  BP:   137/85   Pulse:   (!) 102   Resp:  15    Temp: 99 F (37.2 C)   98 F (36.7 C)  TempSrc: Oral   Oral  SpO2:      Weight:      Height:        Intake/Output Summary (Last 24 hours) at 04/19/17 1431 Last data filed at 04/19/17 1046  Gross per 24 hour  Intake               10 ml  Output             2275 ml  Net            -2265 ml   Filed Weights   04/14/17 1253 04/14/17 2300  Weight: 200 lb (90.7 kg) 205 lb 12.8 oz (93.4 kg)    Telemetry    SR - Personally Reviewed  ECG    N/a - Personally Reviewed  Physical Exam   General: Well developed, well nourished, male appearing in no acute distress. Head: Normocephalic, atraumatic.  Neck: Supple without bruits, JVD. Lungs:  Resp regular and unlabored, CTA. Heart: RRR, S1, S2, no S3, S4, or murmur; no rub. Abdomen: Soft, non-tender, non-distended with normoactive  bowel sounds. No hepatomegaly. No rebound/guarding. No obvious abdominal masses. Extremities: No clubbing, cyanosis, LUE edema. Distal pedal pulses are 2+ bilaterally. Neuro: Alert and oriented X 3. Moves all extremities spontaneously. Psych: Normal affect.  Labs    Chemistry Recent Labs Lab 04/16/17 0603 04/18/17 0416 04/18/17 2001 04/19/17 0247  NA 136 132* 133* 134*  K 3.5 3.4* 3.6 3.4*  CL 106 106 105 108  CO2 21* 14* 19* 18*  GLUCOSE 115* 103* 122* 118*  BUN 14 13 22* 20  CREATININE 0.72 0.87 1.16 0.89  CALCIUM 8.7* 8.6* 8.6* 8.2*  PROT 6.7  --   --   --   ALBUMIN 3.5  --   --   --   AST 46*  --   --   --   ALT 35  --   --   --   ALKPHOS 88  --   --   --   BILITOT 0.9  --   --   --   GFRNONAA >60 >60 >60 >60  GFRAA >60 >60 >60 >  60  ANIONGAP 9 12 9 8      Hematology Recent Labs Lab 04/15/17 0554 04/18/17 0416 04/19/17 0247 04/19/17 0900  WBC 5.4 8.1 6.1  --   RBC 3.29* 3.25* 3.08*  --   HGB 11.8* 11.1* 10.8*  --   HCT 35.1* 34.5* 32.1*  --   MCV 106.7* 106.2* 104.2*  --   MCH 35.9* 34.2* 35.1*  --   MCHC 33.6 32.2 33.6  --   RDW 13.2 13.2 13.1  --   PLT PLATELET CLUMPS NOTED ON SMEAR, UNABLE TO ESTIMATE PLATELET CLUMPS NOTED ON SMEAR, UNABLE TO ESTIMATE PLATELET CLUMPS NOTED ON SMEAR, UNABLE TO ESTIMATE PLATELET CLUMPS NOTED ON SMEAR, UNABLE TO ESTIMATE    Cardiac Enzymes Recent Labs Lab 04/14/17 2305 04/15/17 0234 04/15/17 0540  TROPONINI <0.03 <0.03 <0.03    Recent Labs Lab 04/14/17 1313  TROPIPOC 0.00     BNPNo results for input(s): BNP, PROBNP in the last 168 hours.   DDimer No results for input(s): DDIMER in the last 168 hours.    Radiology    Dg Shoulder Left  Result Date: 04/18/2017 CLINICAL DATA:  Left shoulder pain. EXAM: LEFT SHOULDER - 2+ VIEW COMPARISON:  None. FINDINGS: There is no evidence of fracture or dislocation. There is no evidence of arthropathy or other focal bone abnormality. Soft tissues are unremarkable. IMPRESSION:  Normal left shoulder. Electronically Signed   By: Marijo Conception, M.D.   On: 04/18/2017 14:19   Dg Hand Complete Left  Result Date: 04/18/2017 CLINICAL DATA:  Left hand pain. EXAM: LEFT HAND - COMPLETE 3+ VIEW COMPARISON:  None. FINDINGS: There is no evidence of fracture or dislocation. Narrowing and osteophyte formation is seen involving the first carpometacarpal joint. Soft tissues are unremarkable. IMPRESSION: Moderate osteoarthritis of first carpometacarpal joint. No acute abnormality seen the left hand. Electronically Signed   By: Marijo Conception, M.D.   On: 04/18/2017 14:20    Cardiac Studies   Cath: 04/15/17  Conclusion    Severe 2 vessel coronary disease with calcific total occlusion of the mid right coronary over a long segment.  Severe calcified coronary disease involving the proximal to mid LAD with 60-80% stenosis depending upon review and FFR positive at 0.74 beyond the second diagonal.  Luminal irregularities with less than 50% stenosis in the circumflex.  Normal left ventricular systolic function. EF greater than 55%. Mildly elevated LVEDP, 18 mmHg.  RECOMMENDATIONS:   Very difficult clinical situation. I believe the patient is alcohol dependent. He has no insurance. He is reluctant to have any care because of expense. According to his girlfriend he may not be reliable with chronic medication compliance.  HEART TEAM APPROACH; discuss CABG versus LAD atherectomy followed by stenting with medical therapy for right coronary. Chronic medical therapy without revascularization is probably not a good option due to the large region at risk should the LAD close.  Aggressive blood pressure control and lipid management.  CIWA   Patient Profile     56 y.o. male with PMH of ETOH use, HL, HTN and tobacco use who presented with chest pain. Underwent cath with 2v disease. Planned for PCI with atherectomy.   Assessment & Plan    1. CAD: Noted to have 2v disease on cath from  9/28. Now stable from withdrawal perspective. Will plan for cath tomorrow with PCI and atherectomy to the LAD.  -- load with plavix 300mg  today, then 75mg  tomorrow. Reduce ASA to 81mg .  -- The patient understands that  risks included but are not limited to stroke (1 in 1000), death (1 in 39), kidney failure [usually temporary] (1 in 500), bleeding (1 in 200), allergic reaction [possibly serious] (1 in 200).  -- continue medical therapy with statin, imdur, BB, ARB  2. HTN: controlled   3. HL: on statin  4. Hypokalemia: Replete per primary  5. Anemia: Hgb stable  -- follow CBC  6. ETOH withdrawal: per primary  Addendum: noted thus far unable to obtain platelet count. Discussed with hematology. Will order finger stick CBC/heparin tube collect. Discussed with lab regarding modification.    Signed, Reino Bellis, NP  04/19/2017, 2:31 PM  Pager # [phone]   For questions or updates, please contact Secretary Please consult [email or web address] for contact info under Cardiology/STEMI. Daytime calls, contact the Day Call APP (6a-8a) or assigned team (Teams A-D) provider (7:30a - 5p). All other daytime calls (7:30-5p), contact the Card Master @ [phone].   Nighttime calls, contact the assigned APP (5p-8p) or MD (6:30p-8p). Overnight calls (8p-6a), contact the on call Fellow @ [phone].  I have examined the patient and reviewed assessment and plan and discussed with patient.  Agree with above as stated.  I personally reviewed his cath films and explained the procedure.  He reported severe pain with right radial approach, so femormal may be preferable.  WOuld recommend SYNERGY stent to minimize duration of antiplatelet therapy.  I stressed the importance of DAPT for 6-12 months post procedure.  He has financial issues and is concerned if he can afford medicines.  He will need a case manager consult.    Avoid tobacco.  Swollen left arm.  Eval for DVT.     Larae Grooms

## 2017-04-20 NOTE — Progress Notes (Signed)
OT Cancellation Note  Patient Details Name: KARTHIK WHITTINGHILL MRN: [id number] DOB: 06-20-1961   Cancelled Treatment:    Reason Eval/Treat Not Completed: Patient at procedure or test/ unavailable (Cath lab)  Malka So 04/20/2017, 8:31 AM\ 04/20/2017 Nestor Lewandowsky, OTR/L Pager: [phone]

## 2017-04-20 NOTE — Progress Notes (Signed)
Site area: right groin  Site Prior to Removal:  Level 0  Pressure Applied For 30 MINUTES    Minutes Beginning at 1255  Manual:   Yes.    Patient Status During Pull: AAO X 4  Post Pull Groin Site:  Level 0  Post Pull Instructions Given:  Yes.    Post Pull Pulses Present:  Yes.    Dressing Applied:  Yes.    Comments:  Tolerated procedure well

## 2017-04-20 NOTE — Care Management Note (Addendum)
Case Management Note  Patient Details  Name: Richard Calhoun MRN: [id number] Date of Birth: 1960-08-12  Subjective/Objective:  From home with girlfriend, s/p coronary stent intervention, will be on plavix,  patient is already established at the [facility], he will need to call on Monday to schedule a follow up apt.  MD will need to send scripts to [facility] if patient is discharged during Mon- Friday, if he is discharged over weekend he will need a Match Letter form NCM .    10/4 Ida, BSN - Patient is for dc today, pt eval pending, patient with no insurance, he will need a rolling walker, he states ok to use [facility].  Referral made to [facility] for rolling walker, this will be brought up to patient's room before discharge.   MD printed scripts out for patient.                Action/Plan: NCM will follow for dc needs.   Expected Discharge Date:                  Expected Discharge Plan:  Home/Self Care  In-House Referral:     Discharge planning Services  CM Consult, [facility], Medication Assistance  Post Acute Care Choice:    Choice offered to:     DME Arranged:    DME Agency:     HH Arranged:    HH Agency:     Status of Service:  Completed, signed off  If discussed at [employer] of Stay Meetings, dates discussed:    Additional Comments:  Richard Mayo, RN 04/20/2017, 10:46 AM

## 2017-04-20 NOTE — Progress Notes (Signed)
PT Cancellation Note  Patient Details Name: MANCEL LARDIZABAL MRN: [id number] DOB: [birth year]   Cancelled Treatment:    Reason Eval/Treat Not Completed: Patient at procedure or test/unavailable. Patient in cath lab.   Despina Pole 04/20/2017, 10:14 AM Carita Pian Sanjuana Kava, [place] [facility] Pager [phone]

## 2017-04-21 ENCOUNTER — Inpatient Hospital Stay (HOSPITAL_COMMUNITY): Payer: Self-pay

## 2017-04-21 DIAGNOSIS — M7989 Other specified soft tissue disorders: Secondary | ICD-10-CM

## 2017-04-21 LAB — CBC WITH DIFFERENTIAL/PLATELET
BASOS PCT: 1 %
Basophils Absolute: 0.1 10*3/uL (ref 0.0–0.1)
EOS PCT: 1 %
Eosinophils Absolute: 0.1 10*3/uL (ref 0.0–0.7)
HCT: 33.1 % — ABNORMAL LOW (ref 39.0–52.0)
Hemoglobin: 10.8 g/dL — ABNORMAL LOW (ref 13.0–17.0)
LYMPHS ABS: 0.8 10*3/uL (ref 0.7–4.0)
LYMPHS PCT: 13 %
MCH: 34.5 pg — AB (ref 26.0–34.0)
MCHC: 32.6 g/dL (ref 30.0–36.0)
MCV: 105.8 fL — AB (ref 78.0–100.0)
MONO ABS: 0.8 10*3/uL (ref 0.1–1.0)
Monocytes Relative: 14 %
NEUTROS ABS: 4 10*3/uL (ref 1.7–7.7)
Neutrophils Relative %: 71 %
RBC: 3.13 MIL/uL — ABNORMAL LOW (ref 4.22–5.81)
RDW: 13.6 % (ref 11.5–15.5)
WBC: 5.8 10*3/uL (ref 4.0–10.5)

## 2017-04-21 LAB — GLUCOSE, CAPILLARY
Glucose-Capillary: 109 mg/dL — ABNORMAL HIGH (ref 65–99)
Glucose-Capillary: 104 mg/dL — ABNORMAL HIGH (ref 65–99)

## 2017-04-21 LAB — BASIC METABOLIC PANEL
Anion gap: 9 (ref 5–15)
BUN: 16 mg/dL (ref 6–20)
CALCIUM: 9.3 mg/dL (ref 8.9–10.3)
CHLORIDE: 107 mmol/L (ref 101–111)
CO2: 18 mmol/L — AB (ref 22–32)
Creatinine, Ser: 0.92 mg/dL (ref 0.61–1.24)
GFR calc Af Amer: >60 mL/min (ref >60)
GFR calc non Af Amer: >60 mL/min (ref >60)
GLUCOSE: 110 mg/dL — AB (ref 65–99)
POTASSIUM: 4.6 mmol/L (ref 3.5–5.1)
Sodium: 134 mmol/L — ABNORMAL LOW (ref 135–145)

## 2017-04-21 MED ORDER — NITROGLYCERIN 0.4 MG SL SUBL: 0.4000 mg | SUBLINGUAL_TABLET | SUBLINGUAL | Status: DC | PRN

## 2017-04-21 MED ORDER — CLOPIDOGREL BISULFATE 75 MG PO TABS: 75.0000 mg | ORAL_TABLET | Freq: Every day | ORAL | 0 refills | Status: DC

## 2017-04-21 MED ORDER — HYDROCODONE-ACETAMINOPHEN 5-325 MG PO TABS: 1.0000 | ORAL_TABLET | Freq: Four times a day (QID) | ORAL | 0 refills | Status: DC | PRN

## 2017-04-21 MED ORDER — ATORVASTATIN CALCIUM 80 MG PO TABS: 80.0000 mg | ORAL_TABLET | Freq: Every day | ORAL | 0 refills | Status: DC

## 2017-04-21 MED ORDER — ISOSORBIDE MONONITRATE ER 30 MG PO TB24: 30.0000 mg | ORAL_TABLET | Freq: Every day | ORAL | 0 refills | Status: DC

## 2017-04-21 MED ORDER — NITROGLYCERIN 0.4 MG SL SUBL: 0.4000 mg | SUBLINGUAL_TABLET | SUBLINGUAL | 0 refills | Status: DC | PRN

## 2017-04-21 MED ORDER — ASPIRIN 81 MG PO TBEC: 81.0000 mg | DELAYED_RELEASE_TABLET | Freq: Every day | ORAL | Status: DC

## 2017-04-21 MED FILL — NITROSTAT 0.4 MG TABLET SL: 0.4 | 20 days supply | Qty: 25 | Fill #0

## 2017-04-21 MED FILL — ATORVASTATIN 80 MG TABLET: 80 | 30 days supply | Qty: 30 | Fill #0

## 2017-04-21 MED FILL — ISOSORBIDE MN ER 30 MG TAB: 30 | 30 days supply | Qty: 30 | Fill #0

## 2017-04-21 NOTE — Progress Notes (Signed)
Ruthell Rummage NP updated on pt's R foot status. Xray showed soft tissue swelling. Have been elevating and icing R foot which remains tender to the touch warm and swollen. Vicodin 1 tab administered q 6hrs for pain. Will continue to monitor closely.

## 2017-04-21 NOTE — Progress Notes (Signed)
VASCULAR LAB PRELIMINARY  PRELIMINARY  PRELIMINARY  PRELIMINARY  Left upper extremity venous duplex completed.    Preliminary report:  There is no DVT or SVT noted in the left upper extremity.   Denice Cardon, RVT 04/21/2017, 12:34 PM

## 2017-04-21 NOTE — Discharge Summary (Signed)
Physician Discharge Summary  Richard Calhoun HGD:[id number] DOB: 03-21-1961 DOA: 04/14/2017  PCP: Alfonse Spruce, FNP  Admit date: 04/14/2017 Discharge date: 04/21/2017  Admitted From Disposition:    Recommendations for Outpatient Follow-up:  1. Follow up with PCP in 1-2 weeks 2. Please obtain BMP/CBC in one week  Home Health:none Equipment/Devices:rolling walker  Discharge Condition:stable CODE STATUS:full Diet recommendation: cardiac  56 y.o.malewith medical history significant of HTN, HLD, Significant EtOH Abuse, Paroxysmal Tachycardia. GERD, Hx of Tobacco Abuse and other comorbids who presented to [facility] from his PCP office with a CC of Chest Pain with Exertion that started on Saturday. Patient states he has had chronic dizziness which was improved after he was placed on a Beta Blocker. He states he was dizzy all day Saturday and developed Left sided CP after he exerted himself taking out the garbage. States the CP was a dull 2/10 in intensity with no radiation but he felt some numbness on his left shoulder the next day. Patient states CP lasted about 5 minutes and went away after resting. Patient denied any N/V but stated he thinks he may have had some Diaphoresis and was SOB after. Patient also states that the CP is intermittent with Exertion and did not have any today when walked to the bus stop from his place to go to his PCP appointment. When asked about the CP the patient currently does not have any and states it had felt like a toothache. [facility] was called to admit for Chest Pain with Exertion and Cardiology was consulted to see the patient. Patient was cathed and showed Severe 2 vessel disease. Cardiothoracic Surgery consulted and patient was not currently a candidate for CABG as patient started going through EtOH withdrawals the day after he was Catheterized. He was transferred to the ICU due to Severe withdrawal requiring Precedex on 04/16/17. He was stabilized and transferred to [facility]  Service 10/2.patient had LAD PCI Today.  patient co right foot pain.xray shows soft tisuue swelling.   Discharge Diagnoses:  Principal Problem:   Coronary artery disease involving native coronary artery of native heart with angina pectoris ([place]) Active Problems:   Hyperlipidemia   EtOH dependence ([place])   Essential hypertension   GERD   Hypertensive heart disease without heart failure   Chest pain   Hypokalemia   Tobacco abuse   Chest pain on exertion   Acute hyperactive alcohol withdrawal delirium ([facility])   Hand edema   Hypophosphatemia  CAD -s/p PCI with atherotomy.continue asa/plavix,statin,nitrates,bb and arb. Right foot  Pain with soft tissue swelling-?cholesterol emboli?treat symptomatically.he has good sensation and pulse.order rolling walker.pt eval.  Discharge Instructions  Discharge Instructions    AMB Referral to Cardiac Rehabilitation - Phase II    Complete by:  As directed    Diagnosis:  Coronary Stents   Amb Referral to Cardiac Rehabilitation    Complete by:  As directed    Diagnosis:  Coronary Stents     Allergies as of 04/21/2017      Reactions   Penicillins Other (See Comments)   Childhood reaction Has patient had a PCN reaction causing immediate rash, facial/tongue/throat swelling, SOB or lightheadedness with hypotension: Unknown Has patient had a PCN reaction causing severe rash involving mucus membranes or skin necrosis: Unknown Has patient had a PCN reaction that required hospitalization: No Has patient had a PCN reaction occurring within the last 10 years: No If all of the above answers are "NO", then may proceed with Cephalosporin use.  Medication List    STOP taking these medications   ibuprofen 200 MG tablet Commonly known as:  ADVIL,MOTRIN   ibuprofen 600 MG tablet Commonly known as:  ADVIL,MOTRIN     TAKE these medications   aspirin 81 MG EC tablet Take 1 tablet (81 mg total) by mouth daily.   atorvastatin 80 MG tablet Commonly  known as:  LIPITOR Take 1 tablet (80 mg total) by mouth daily at 6 PM.   clopidogrel 75 MG tablet Commonly known as:  PLAVIX Take 1 tablet (75 mg total) by mouth daily.   HYDROcodone-acetaminophen 5-325 MG tablet Commonly known as:  NORCO/VICODIN Take 1 tablet by mouth every 6 (six) hours as needed for moderate pain.   isosorbide mononitrate 30 MG 24 hr tablet Commonly known as:  IMDUR Take 1 tablet (30 mg total) by mouth daily.   ketoconazole 2 % cream Commonly known as:  NIZORAL Apply 1 application topically daily. Apply to affected areas.   losartan 50 MG tablet Commonly known as:  COZAAR Take 1 tablet (50 mg total) by mouth daily.   metoprolol tartrate 25 MG tablet Commonly known as:  LOPRESSOR Take 1 tablet (25 mg total) by mouth 2 (two) times daily.   multivitamin-iron-minerals-folic acid chewable tablet Chew 1 tablet by mouth daily.   nitroGLYCERIN 0.4 MG SL tablet Commonly known as:  NITROSTAT Place 1 tablet (0.4 mg total) under the tongue every 5 (five) minutes as needed for chest pain.   pantoprazole 40 MG tablet Commonly known as:  PROTONIX Take 1 tablet (40 mg total) by mouth daily.   simvastatin 40 MG tablet Commonly known as:  ZOCOR Take 1 tablet (40 mg total) by mouth at bedtime.   triamcinolone 0.025 % ointment Commonly known as:  KENALOG Apply 1 application topically 2 (two) times daily. Apply to affected areas.   vitamin C 500 MG tablet Commonly known as:  ASCORBIC ACID Take 500 mg by mouth daily.            Durable Medical Equipment        Start     Ordered   04/21/17 1029  For home use only DME Walker rolling  Once    Question:  Patient needs a walker to treat with the following condition  Answer:  Pain   04/21/17 1029     Follow-up [place] Follow up.   Why:  please call on Monday morning to schedule a hospital follow up apt with Dr. Tawnya Crook information: [place] [id number] [phone]         Allergies  Allergen Reactions  . Penicillins Other (See Comments)    Childhood reaction Has patient had a PCN reaction causing immediate rash, facial/tongue/throat swelling, SOB or lightheadedness with hypotension: Unknown Has patient had a PCN reaction causing severe rash involving mucus membranes or skin necrosis: Unknown Has patient had a PCN reaction that required hospitalization: No Has patient had a PCN reaction occurring within the last 10 years: No If all of the above answers are "NO", then may proceed with Cephalosporin use.     Consultations:  cards   Procedures/Studies: Dg Chest 2 View  Result Date: 04/14/2017 CLINICAL DATA:  Left-sided chest pain and dizziness 5 days with left arm pain and tachycardia. EXAM: CHEST  2 VIEW COMPARISON:  None. FINDINGS: Lungs are adequately inflated and otherwise clear. Cardiomediastinal silhouette is within normal. Bones and soft tissues are  normal. IMPRESSION: No active cardiopulmonary disease. Electronically Signed   By: Marin Olp M.D.   On: 04/14/2017 13:36   Dg Shoulder Left  Result Date: 04/18/2017 CLINICAL DATA:  Left shoulder pain. EXAM: LEFT SHOULDER - 2+ VIEW COMPARISON:  None. FINDINGS: There is no evidence of fracture or dislocation. There is no evidence of arthropathy or other focal bone abnormality. Soft tissues are unremarkable. IMPRESSION: Normal left shoulder. Electronically Signed   By: Marijo Conception, M.D.   On: 04/18/2017 14:19   Dg Hand Complete Left  Result Date: 04/18/2017 CLINICAL DATA:  Left hand pain. EXAM: LEFT HAND - COMPLETE 3+ VIEW COMPARISON:  None. FINDINGS: There is no evidence of fracture or dislocation. Narrowing and osteophyte formation is seen involving the first carpometacarpal joint. Soft tissues are unremarkable. IMPRESSION: Moderate osteoarthritis of first carpometacarpal joint. No acute abnormality seen the left hand.  Electronically Signed   By: Marijo Conception, M.D.   On: 04/18/2017 14:20   Dg Foot Complete Right  Result Date: 04/20/2017 CLINICAL DATA:  Dorsal right foot pain.  No reported injury. EXAM: RIGHT FOOT COMPLETE - 3+ VIEW COMPARISON:  None. FINDINGS: Dorsal and proximal right foot soft tissue swelling. No fracture or dislocation. No suspicious focal osseous lesion. No evidence of cortical erosions or periosteal reaction. No significant arthropathy. No radiopaque foreign body. IMPRESSION: Dorsal/proximal right foot soft tissue swelling. No fracture, malalignment, significant arthropathy or specific radiographic findings of osteomyelitis. Electronically Signed   By: Ilona Sorrel M.D.   On: 04/20/2017 20:56    (Echo, Carotid, EGD, Colonoscopy, ERCP)    Subjective:   Discharge Exam: Vitals:   04/21/17 0630 04/21/17 0730  BP:  127/69  Pulse: (!) 107 81  Resp: 14 14  Temp:  98.4 F (36.9 C)  SpO2: 98% 98%   Vitals:   04/21/17 0526 04/21/17 0530 04/21/17 0630 04/21/17 0730  BP:  135/83  127/69  Pulse:  87 (!) 107 81  Resp:  15 14 14   Temp:    98.4 F (36.9 C)  TempSrc:    Oral  SpO2:  100% 98% 98%  Weight: 92.5 kg (204 lb)     Height:        General: Pt is alert, awake, not in acute distress Cardiovascular: RRR, S1/S2 +, no rubs, no gallops Respiratory: CTA bilaterally, no wheezing, no rhonchi Abdominal: Soft, NT, ND, bowel sounds + Extremities: right foot swollen tender to touch    The results of significant diagnostics from this hospitalization (including imaging, microbiology, ancillary and laboratory) are listed below for reference.     Microbiology: Recent Results (from the past 240 hour(s))  MRSA PCR Screening     Status: None   Collection Time: 04/16/17  5:58 AM  Result Value Ref Range Status   MRSA by PCR NEGATIVE NEGATIVE Final    Comment:        The GeneXpert MRSA Assay (FDA approved for NASAL specimens only), is one component of a comprehensive MRSA  colonization surveillance program. It is not intended to diagnose MRSA infection nor to guide or monitor treatment for MRSA infections.      Labs: BNP (last 3 results) No results for input(s): BNP in the last 8760 hours. Basic Metabolic Panel:  Recent Labs Lab 04/15/17 0234  04/16/17 0603 04/18/17 0416 04/18/17 2001 04/19/17 0247 04/20/17 0233 04/21/17 0537  NA  --   < > 136 132* 133* 134* 135 134*  K  --   < > 3.5 3.4*  3.6 3.4* 3.9 4.6  CL  --   < > 106 106 105 108 110 107  CO2  --   < > 21* 14* 19* 18* 17* 18*  GLUCOSE  --   < > 115* 103* 122* 118* 112* 110*  BUN  --   < > 14 13 22* 20 23* 16  CREATININE  --   < > 0.72 0.87 1.16 0.89 0.92 0.92  CALCIUM  --   < > 8.7* 8.6* 8.6* 8.2* 8.7* 9.3  MG 0.9*  --  1.2* 1.6*  --  2.3 1.8  --   PHOS 2.7  --   --   --   --  1.9* 2.8  --   < > = values in this interval not displayed. Liver Function Tests:  Recent Labs Lab 04/16/17 0603 04/20/17 0233  AST 46* 43*  ALT 35 34  ALKPHOS 88 103  BILITOT 0.9 0.9  PROT 6.7 7.0  ALBUMIN 3.5 3.0*   No results for input(s): LIPASE, AMYLASE in the last 168 hours. No results for input(s): AMMONIA in the last 168 hours. CBC:  Recent Labs Lab 04/18/17 0416 04/19/17 0247 04/19/17 0900 04/19/17 1845 04/20/17 0233 04/21/17 0537  WBC 8.1 6.1  --  7.8 5.7 5.8  NEUTROABS  --   --   --   --  4.3 4.0  HGB 11.1* 10.8*  --  11.5* 10.2* 10.8*  HCT 34.5* 32.1*  --  34.6* 30.4* 33.1*  MCV 106.2* 104.2*  --  104.8* 105.6* 105.8*  PLT PLATELET CLUMPS NOTED ON SMEAR, UNABLE TO ESTIMATE PLATELET CLUMPS NOTED ON SMEAR, UNABLE TO ESTIMATE PLATELET CLUMPS NOTED ON SMEAR, UNABLE TO ESTIMATE PLATELET CLUMPS NOTED ON SMEAR, UNABLE TO ESTIMATE PLATELET CLUMPS NOTED ON SMEAR, UNABLE TO ESTIMATE PLATELET CLUMPING, SUGGEST RECOLLECTION OF SAMPLE IN CITRATE TUBE.   Cardiac Enzymes:  Recent Labs Lab 04/14/17 2305 04/15/17 0234 04/15/17 0540  TROPONINI <0.03 <0.03 <0.03   BNP: Invalid input(s):  POCBNP CBG:  Recent Labs Lab 04/20/17 0743 04/20/17 1206 04/20/17 1600 04/20/17 2121 04/21/17 0550  GLUCAP 112* 88 104* 106* 109*   D-Dimer No results for input(s): DDIMER in the last 72 hours. Hgb A1c No results for input(s): HGBA1C in the last 72 hours. Lipid Profile No results for input(s): CHOL, HDL, LDLCALC, TRIG, CHOLHDL, LDLDIRECT in the last 72 hours. Thyroid function studies No results for input(s): TSH, T4TOTAL, T3FREE, THYROIDAB in the last 72 hours.  Invalid input(s): FREET3 Anemia work up No results for input(s): VITAMINB12, FOLATE, FERRITIN, TIBC, IRON, RETICCTPCT in the last 72 hours. Urinalysis    Component Value Date/Time   COLORURINE LT. YELLOW 08/29/2012 1520   APPEARANCEUR CLEAR 08/29/2012 1520   LABSPEC 1.020 08/29/2012 1520   PHURINE 6.0 08/29/2012 1520   GLUCOSEU NEGATIVE 08/29/2012 1520   HGBUR NEGATIVE 08/29/2012 1520   BILIRUBINUR NEGATIVE 08/29/2012 1520   KETONESUR NEGATIVE 08/29/2012 1520   UROBILINOGEN 0.2 08/29/2012 1520   NITRITE NEGATIVE 08/29/2012 1520   LEUKOCYTESUR NEGATIVE 08/29/2012 1520   Sepsis Labs Invalid input(s): PROCALCITONIN,  WBC,  LACTICIDVEN Microbiology Recent Results (from the past 240 hour(s))  MRSA PCR Screening     Status: None   Collection Time: 04/16/17  5:58 AM  Result Value Ref Range Status   MRSA by PCR NEGATIVE NEGATIVE Final    Comment:        The GeneXpert MRSA Assay (FDA approved for NASAL specimens only), is one component of a comprehensive MRSA colonization surveillance program.  It is not intended to diagnose MRSA infection nor to guide or monitor treatment for MRSA infections.      Time coordinating discharge: Over 30 minutes  SIGNED:   Georgette Shell, MD  [facility] 04/21/2017, 10:32 AM  If 7PM-7AM, please contact night-coverage [email or web address] Password [email or web address]

## 2017-04-21 NOTE — Progress Notes (Signed)
Progress Note  Patient Name: Richard Calhoun Date of Encounter: 04/21/2017  Primary Cardiologist: Tamala Julian  Subjective   No chest pain, but reports swelling in right foot and ankle.   Inpatient Medications    Scheduled Meds: . aspirin EC  81 mg Oral Daily  . atorvastatin  80 mg Oral q1800  . chlordiazePOXIDE  10 mg Oral TID  . clopidogrel  75 mg Oral Daily  . enoxaparin (LOVENOX) injection  40 mg Subcutaneous Daily  . insulin aspart  2-6 Units Subcutaneous Q4H  . isosorbide mononitrate  30 mg Oral Daily  . losartan  100 mg Oral Daily  . metoprolol tartrate  25 mg Oral BID  . pantoprazole  40 mg Oral Daily  . potassium chloride  40 mEq Oral BID  . sodium chloride flush  3 mL Intravenous Q12H  . sodium chloride flush  3 mL Intravenous Q12H  . vitamin C  500 mg Oral Daily   Continuous Infusions: . sodium chloride Stopped (04/18/17 0600)  . sodium chloride     PRN Meds: sodium chloride, sodium chloride, acetaminophen, gi cocktail, HYDROcodone-acetaminophen, LORazepam, morphine injection, ondansetron (ZOFRAN) IV, sodium chloride flush, sodium chloride flush   Vital Signs    Vitals:   04/21/17 0526 04/21/17 0530 04/21/17 0630 04/21/17 0730  BP:  135/83  127/69  Pulse:  87 (!) 107 81  Resp:  15 14 14   Temp:      TempSrc:      SpO2:  100% 98% 98%  Weight: 204 lb (92.5 kg)     Height:        Intake/Output Summary (Last 24 hours) at 04/21/17 0839 Last data filed at 04/21/17 0650  Gross per 24 hour  Intake             1113 ml  Output             4525 ml  Net            -3412 ml   Filed Weights   04/14/17 1253 04/14/17 2300 04/21/17 0526  Weight: 200 lb (90.7 kg) 205 lb 12.8 oz (93.4 kg) 204 lb (92.5 kg)    Telemetry    SR - Personally Reviewed  ECG    SR - Personally Reviewed  Physical Exam   General: Well developed, well nourished, male appearing in no acute distress. Head: Normocephalic, atraumatic.  Neck: Supple without bruits, JVD. Lungs:  Resp  regular and unlabored, CTA. Heart: RRR, S1, S2, no S3, S4, or murmur; no rub. Abdomen: Soft, non-tender, non-distended with normoactive bowel sounds. No hepatomegaly. No rebound/guarding. No obvious abdominal masses. Extremities: No clubbing, cyanosis, right foot edema. Distal pedal pulses are 2+ bilaterally. Right femoral site stable without hematoma or bruising.  Neuro: Alert and oriented X 3. Moves all extremities spontaneously. Psych: Normal affect.  Labs    Chemistry Recent Labs Lab 04/16/17 0603  04/19/17 0247 04/20/17 0233 04/21/17 0537  NA 136  < > 134* 135 134*  K 3.5  < > 3.4* 3.9 4.6  CL 106  < > 108 110 107  CO2 21*  < > 18* 17* 18*  GLUCOSE 115*  < > 118* 112* 110*  BUN 14  < > 20 23* 16  CREATININE 0.72  < > 0.89 0.92 0.92  CALCIUM 8.7*  < > 8.2* 8.7* 9.3  PROT 6.7  --   --  7.0  --   ALBUMIN 3.5  --   --  3.0*  --  AST 46*  --   --  43*  --   ALT 35  --   --  34  --   ALKPHOS 88  --   --  103  --   BILITOT 0.9  --   --  0.9  --   GFRNONAA >60  < > >60 >60 >60  GFRAA >60  < > >60 >60 >60  ANIONGAP 9  < > 8 8 9   < > = values in this interval not displayed.   Hematology Recent Labs Lab 04/19/17 1845 04/20/17 0233 04/21/17 0537  WBC 7.8 5.7 PENDING  RBC 3.30* 2.88* 3.13*  HGB 11.5* 10.2* 10.8*  HCT 34.6* 30.4* 33.1*  MCV 104.8* 105.6* 105.8*  MCH 34.8* 35.4* 34.5*  MCHC 33.2 33.6 32.6  RDW 13.1 13.4 13.6  PLT PLATELET CLUMPS NOTED ON SMEAR, UNABLE TO ESTIMATE PLATELET CLUMPS NOTED ON SMEAR, UNABLE TO ESTIMATE PENDING    Cardiac Enzymes Recent Labs Lab 04/14/17 2305 04/15/17 0234 04/15/17 0540  TROPONINI <0.03 <0.03 <0.03    Recent Labs Lab 04/14/17 1313  TROPIPOC 0.00     BNPNo results for input(s): BNP, PROBNP in the last 168 hours.   DDimer No results for input(s): DDIMER in the last 168 hours.    Radiology    Dg Foot Complete Right  Result Date: 04/20/2017 CLINICAL DATA:  Dorsal right foot pain.  No reported injury. EXAM: RIGHT  FOOT COMPLETE - 3+ VIEW COMPARISON:  None. FINDINGS: Dorsal and proximal right foot soft tissue swelling. No fracture or dislocation. No suspicious focal osseous lesion. No evidence of cortical erosions or periosteal reaction. No significant arthropathy. No radiopaque foreign body. IMPRESSION: Dorsal/proximal right foot soft tissue swelling. No fracture, malalignment, significant arthropathy or specific radiographic findings of osteomyelitis. Electronically Signed   By: Ilona Sorrel M.D.   On: 04/20/2017 20:56    Cardiac Studies   Cath: 04/15/17  Conclusion    Severe 2 vessel coronary disease with calcific total occlusion of the mid right coronary over a long segment.  Severe calcified coronary disease involving the proximal to mid LAD with 60-80% stenosis depending upon review and FFR positive at 0.74 beyond the second diagonal.  Luminal irregularities with less than 50% stenosis in the circumflex.  Normal left ventricular systolic function. EF greater than 55%. Mildly elevated LVEDP, 18 mmHg.  RECOMMENDATIONS:   Very difficult clinical situation. I believe the patient is alcohol dependent. He has no insurance. He is reluctant to have any care because of expense. According to his girlfriend he may not be reliable with chronic medication compliance.  HEART TEAM APPROACH; discuss CABG versus LAD atherectomy followed by stenting with medical therapy for right coronary. Chronic medical therapy without revascularization is probably not a good option due to the large region at risk should the LAD close.  Aggressive blood pressure control and lipid management.  CIWA   Cath: 04/20/17  Conclusion     Mid RCA to Dist RCA lesion, 99 %stenosed.  Dist RCA lesion, 100 %stenosed.  Ost 1st Mrg to 1st Mrg lesion, 40 %stenosed.  A drug eluting stent was successfully placed.  Mid LAD lesion, 80 %stenosed.  Post intervention, there is a 0% residual stenosis.   Successful orbital  atherectomy and Synergy drug-eluting stent placement to the mid LAD.  Recommendations: Dual antiplatelet therapy for at least 6 months. Aggressive treatment of risk factors.   Patient Profile     56 y.o. male with PMH of ETOH use, HL, HTN  and tobacco use who presented with chest pain. Underwent cath with 2v disease. Underwent PCI with atherectomy.   Assessment & Plan    1. CAD: Noted to have 2v disease on cath from 9/28. Now stable from withdrawal perspective. Underwent successful PCI with atherectomy to the mLAD. Plan for DAPT with ASA/Plavix for at least 6 months. Post cath labs with stable Cr 0.92 and Hgb 10.8. No complications noted post cath.  -- continue medical therapy with statin, imdur, BB, ARB -- will arrange follow up appt.   2. HTN: controlled   3. HL: on statin  4. Hypokalemia: resolved  5. Anemia: Hgb stable  -- follow CBC  6. ETOH use: Counseled regarding ETOH use affects and CAD. Also reports that he is vaping, advised against continued vape use.   I have examined the patient and reviewed assessment and plan and discussed with patient.  Agree with above as stated.  Doing well frm a cardiac standpoint.  I stressed the importance of plavix.  His girlfriend needs to be informed of the importance of DAPT for his recent stent.   GEN: Well nourished, well developed, in no acute distress  HEENT: normal  Neck: no JVD, carotid bruits, or masses Cardiac: RRR; no murmurs, rubs, or gallops,no edema  Respiratory:  clear to auscultation bilaterally, normal work of breathing GI: soft, nontender, nondistended,  MS: no deformity or atrophy , no radial hematoma Skin: warm and dry, no rash Neuro:  Strength and sensation are intact Psych: euthymic mood, full affect   Larae Grooms    Signed, Reino Bellis, NP  04/21/2017, 8:39 AM  Pager # [phone]   For questions or updates, please contact [place] Please consult [email or web address] for contact info under  Cardiology/STEMI. Daytime calls, contact the Day Call APP (6a-8a) or assigned team (Teams A-D) provider (7:30a - 5p). All other daytime calls (7:30-5p), contact the Card Master @ [phone].   Nighttime calls, contact the assigned APP (5p-8p) or MD (6:30p-8p). Overnight calls (8p-6a), contact the on call Fellow @ [phone].

## 2017-04-21 NOTE — Progress Notes (Signed)
OT Cancellation Note  Patient Details Name: Richard Calhoun MRN: [id number] DOB: 19-Jun-1961   Cancelled Treatment:    Reason Eval/Treat Not Completed: OT screened, no needs identified, will sign off. Received text from PT that no OT needs were identified. That he can do his own basic ADLs with increased time and girlfriend can A him prn.   Almon Register [phone] 04/21/2017, 12:54 PM

## 2017-04-21 NOTE — Progress Notes (Signed)
CARDIAC REHAB PHASE I   Pt in bed, c/o R foot pain and swelling PT to see pt today, has not ambulated since foot began to swell, which he states occurred after he walked on 10/2. Completed PC I/stent education. Reviewed risk factors, tobacco/ETOH cessation (gave pt fake cigarette), PCI book, anti-platelet therapy, stent card, activity restrictions, ntg, exercise, heart healthy diet and phase 2 cardiac rehab. Pt verbalized understanding, however states he is unable to retain information, would like his girlfriend present, but states she is unable to come in due to work. Encouraged pt to have girlfriend present at time of discharge to review discharge instructions. Also, pt states he does not have a plan to quit drinking at this time.  Pt agrees to phase 2 cardiac rehab referral, however, he asks that they not contact him, he is not interested and cannot afford to attend. Pt in bed, call bell within reach.  [phone] Lenna Sciara, RN, BSN 04/21/2017 10:08 AM

## 2017-04-21 NOTE — Progress Notes (Signed)
Physical Therapy Treatment Patient Details Name: Richard Calhoun MRN: [id number] DOB: 02-10-1961 Today's Date: 04/21/2017    History of Present Illness 56 yr old male with PMHx of HTN, CAD, HLD, recently admitted for unstable angina found to have severe 2 vessel disease on 04/15/17 Cardiac cath and was being considered for CABG vs PCI. He became agitated with HTN, tachycardia c/w with acute alcohol withdrawal which required precedex gtt for adequate control.     PT Comments    Pt pleasant with flat affect and concerned over the pain in his feet stating he has had pain in wrists, back and feet since admission. Pt able to stand and ambulate with RW without physical assist and educated for appropriate sequence and transfers with return home. Pt states he will sleep downstairs and declined attempting stair ambulation today. Will continue to follow to normalize gait but pt safe for return home with DME.  HR 87-92 with gait SpO2 98% on RA    Follow Up Recommendations  No PT follow up     Equipment Recommendations  Rolling walker with 5" wheels    Recommendations for Other Services       Precautions / Restrictions Precautions Precautions: None    Mobility  Bed Mobility Overal bed mobility: Modified Independent                Transfers Overall transfer level: Needs assistance   Transfers: Sit to/from Stand Sit to Stand: Supervision         General transfer comment: cues for hand placement with RW present  Ambulation/Gait Ambulation/Gait assistance: Supervision Ambulation Distance (Feet): 300 Feet Assistive device: Rolling walker (2 wheeled) Gait Pattern/deviations: Step-to pattern;Antalgic   Gait velocity interpretation: Below normal speed for age/gender General Gait Details: decreased stance on RLE due to pain with cues for sequence and safety as well as position in Rw. Pt with increased stride with increased distance.    Stairs Stairs:  (pt declined stating he  will sleep downstairs until his foot is better)          Wheelchair Mobility    Modified Rankin (Stroke Patients Only)       Balance Overall balance assessment: No apparent balance deficits (not formally assessed)                                          Cognition Arousal/Alertness: Awake/alert Behavior During Therapy: WFL for tasks assessed/performed Overall Cognitive Status: Within Functional Limits for tasks assessed                                        Exercises      General Comments        Pertinent Vitals/Pain Pain Assessment: 0-10 Pain Score: 5  Pain Location: right foot Pain Descriptors / Indicators: Sore Pain Intervention(s): Limited activity within patient's tolerance;Repositioned    Home Living                      Prior Function            PT Goals (current goals can now be found in the care plan section) Progress towards PT goals: Progressing toward goals    Frequency           PT Plan Current plan remains  appropriate;Equipment recommendations need to be updated    Co-evaluation              AM-PAC PT "6 Clicks" Daily Activity  Outcome Measure  Difficulty turning over in bed (including adjusting bedclothes, sheets and blankets)?: None Difficulty moving from lying on back to sitting on the side of the bed? : None Difficulty sitting down on and standing up from a chair with arms (e.g., wheelchair, bedside commode, etc,.)?: None Help needed moving to and from a bed to chair (including a wheelchair)?: A Little Help needed walking in hospital room?: A Little Help needed climbing 3-5 steps with a railing? : A Little 6 Click Score: 21    End of Session Equipment Utilized During Treatment: Gait belt Activity Tolerance: Patient tolerated treatment well Patient left: in chair;with call bell/phone within reach Nurse Communication: Mobility status PT Visit Diagnosis: Difficulty in walking,  not elsewhere classified (R26.2);Pain Pain - Right/Left: Right Pain - part of body: Ankle and joints of foot     Time: 1139-1203 PT Time Calculation (min) (ACUTE ONLY): 24 min  Charges:  $Gait Training: 23-37 mins                    G Codes:       Elwyn Reach, [place]   [place] 04/21/2017, 12:09 PM

## 2017-04-22 MED FILL — ?CLOPIDOGREL 75MG TAB: 75 | 30 days supply | Qty: 30 | Fill #0

## 2017-04-22 MED FILL — KETOCONAZOLE 2% CREAM: 2 | 15 days supply | Qty: 15 | Fill #1

## 2017-04-22 MED FILL — Clopidogrel Bisulfate Tab 300 MG (Base Equiv): ORAL | Qty: 1 | Status: AC

## 2017-04-27 ENCOUNTER — Telehealth: Payer: Self-pay | Admitting: Family Medicine

## 2017-04-27 MED FILL — LOSARTAN POTASSIUM 50 MG TA: 50 | 30 days supply | Qty: 30 | Fill #1

## 2017-04-27 MED FILL — ?PANTOPRAZOLE SOD DR 40MG: 40 MG | 30 days supply | Qty: 30 | Fill #1

## 2017-04-27 NOTE — Telephone Encounter (Signed)
Patient called requesting to speak to PCP , pt states he has a cold and would like to know if there is something OTC  That he could take. Pt was offered an appt for tomorrow but due to the weather pt was unable to come in. Please advice?

## 2017-04-27 NOTE — Telephone Encounter (Signed)
Patient called requesting to speak to PCP , pt states he has a cold and would like to know if there is something OTC  That he could take. Pt was offered an appt for tomorrow but due to the weather pt was unable to come in. Please f/up

## 2017-04-28 NOTE — Telephone Encounter (Signed)
Patient is a hospital follow up. He can take over the counter Coricidin for cold symptoms. I do not recommend that he take any production containing decongestants because it can cause his blood pressure to elevate.

## 2017-04-29 NOTE — Telephone Encounter (Signed)
CMA call regarding advising the patient what to take for cold symptoms   Patient was aware and understood

## 2017-05-11 ENCOUNTER — Ambulatory Visit: Payer: Self-pay | Attending: Family Medicine | Admitting: Physician Assistant

## 2017-05-11 ENCOUNTER — Ambulatory Visit: Payer: Self-pay

## 2017-05-11 ENCOUNTER — Ambulatory Visit: Payer: Self-pay | Admitting: Cardiovascular Disease

## 2017-05-11 ENCOUNTER — Encounter: Payer: Self-pay | Admitting: Physician Assistant

## 2017-05-11 VITALS — BP 134/70 | HR 98 | Temp 98.3°F | Resp 16 | Wt 206.6 lb

## 2017-05-11 DIAGNOSIS — F1020 Alcohol dependence, uncomplicated: Secondary | ICD-10-CM

## 2017-05-11 DIAGNOSIS — F17290 Nicotine dependence, other tobacco product, uncomplicated: Secondary | ICD-10-CM | POA: Insufficient documentation

## 2017-05-11 DIAGNOSIS — I10 Essential (primary) hypertension: Secondary | ICD-10-CM | POA: Insufficient documentation

## 2017-05-11 DIAGNOSIS — E785 Hyperlipidemia, unspecified: Secondary | ICD-10-CM | POA: Insufficient documentation

## 2017-05-11 DIAGNOSIS — K219 Gastro-esophageal reflux disease without esophagitis: Secondary | ICD-10-CM | POA: Insufficient documentation

## 2017-05-11 DIAGNOSIS — Z7902 Long term (current) use of antithrombotics/antiplatelets: Secondary | ICD-10-CM | POA: Insufficient documentation

## 2017-05-11 DIAGNOSIS — Z7982 Long term (current) use of aspirin: Secondary | ICD-10-CM | POA: Insufficient documentation

## 2017-05-11 DIAGNOSIS — I2510 Atherosclerotic heart disease of native coronary artery without angina pectoris: Secondary | ICD-10-CM | POA: Insufficient documentation

## 2017-05-11 DIAGNOSIS — Z955 Presence of coronary angioplasty implant and graft: Secondary | ICD-10-CM | POA: Insufficient documentation

## 2017-05-11 DIAGNOSIS — Z79899 Other long term (current) drug therapy: Secondary | ICD-10-CM | POA: Insufficient documentation

## 2017-05-11 MED ORDER — ISOSORBIDE MONONITRATE ER 30 MG PO TB24: 30.0000 mg | ORAL_TABLET | Freq: Every day | ORAL | 5 refills | Status: DC

## 2017-05-11 MED ORDER — ATORVASTATIN CALCIUM 80 MG PO TABS: 80.0000 mg | ORAL_TABLET | Freq: Every day | ORAL | 5 refills | Status: DC

## 2017-05-11 MED ORDER — LOSARTAN POTASSIUM 50 MG PO TABS
50.0000 mg | ORAL_TABLET | Freq: Every day | ORAL | 5 refills | Status: DC
Start: 2017-05-16 — End: 2017-07-08

## 2017-05-11 MED ORDER — METOPROLOL TARTRATE 25 MG PO TABS: 25.0000 mg | ORAL_TABLET | Freq: Two times a day (BID) | ORAL | 5 refills | Status: DC

## 2017-05-11 MED ORDER — CLOPIDOGREL BISULFATE 75 MG PO TABS: 75.0000 mg | ORAL_TABLET | Freq: Every day | ORAL | 5 refills | Status: DC

## 2017-05-11 MED FILL — METOPROLOL TARTRATE 25 MG T: 25 | 30 days supply | Qty: 60 | Fill #0

## 2017-05-11 NOTE — Patient Instructions (Addendum)
Call for appt with Dr. Debara Pickett within 2 weeks or their NP or PA for hospital follo w up   Try and cut back the Vaping  Low fat, low cholesterol diet  Return in 3-4 weeks

## 2017-05-11 NOTE — Progress Notes (Signed)
Chief Complaint: "hospital follow up"  Subjective: This is a 56 year old male with a history of hypertension, hyperlipidemia and reflux who presented to the hospital on 04/14/2017 with complaints of chest pain that were exertional and suspicious for angina. He ruled out for myocardial infarction. He underwent cardiac catheterization and was found to have severe 2 vessel coronary artery disease. He was referred for coronary artery bypass grafting. However, after his cardiac catheterization he started having alcohol withdrawal requiring Precedex and transition to the ICU. Surgery was canceled. He went for percutaneous coronary intervention of the left anterior dyspnea artery with a drug-eluting stent and stent. His ejection fraction is normal.  His hospital course was complicated by right foot pain as well as the DTs. An x-ray of the right foot showed some soft tissue swelling only. No fractures. Inpatient physical therapy was ordered but no outpatient knees identified.  No chest pain. No shortness of breath. Continues to Vape. No lightheadedness. No dizziness. Compliant with his medications. His foot pain is better, swelling has gone down.   ROS:  GEN: denies fever or chills, denies change in weight Skin: denies lesions or rashes HEENT: denies headache, earache, epistaxis, sore throat, or neck pain LUNGS: denies SHOB, dyspnea, PND, orthopnea CV: denies CP or palpitations EXT: denies muscle spasms or swelling; no pain in lower ext, no weakness NEURO: denies numbness or tingling, denies sz, stroke or TIA   Objective:  Vitals:   05/11/17 1140  BP: 134/70  Pulse: 98  Resp: 16  Temp: 98.3 F (36.8 C)  TempSrc: Oral  SpO2: 98%  Weight: 206 lb 9.6 oz (93.7 kg)    Physical Exam:  General: in no acute distress. HEENT: no pallor, no icterus, moist oral mucosa, no JVD, no lymphadenopathy Heart: Normal  s1 &s2  Regular rate and rhythm, without murmurs, rubs, gallops. Lungs: Clear to  auscultation bilaterally. Extremities: No clubbing cyanosis or edema with positive pedal pulses. Neuro: Alert, awake, oriented x3, nonfocal.   Medications: Prior to Admission medications   Medication Sig Start Date End Date Taking? Authorizing Provider  aspirin EC 81 MG EC tablet Take 1 tablet (81 mg total) by mouth daily. 04/21/17  Yes Georgette Shell, MD  atorvastatin (LIPITOR) 80 MG tablet Take 1 tablet (80 mg total) by mouth daily at 6 PM. 05/16/17  Yes Ena Dawley, Tiffany S, PA-C  clopidogrel (PLAVIX) 75 MG tablet Take 1 tablet (75 mg total) by mouth daily. 05/16/17  Yes Ena Dawley, Tiffany S, PA-C  HYDROcodone-acetaminophen (NORCO/VICODIN) 5-325 MG tablet Take 1 tablet by mouth every 6 (six) hours as needed for moderate pain. 04/21/17  Yes Georgette Shell, MD  isosorbide mononitrate (IMDUR) 30 MG 24 hr tablet Take 1 tablet (30 mg total) by mouth daily. 05/16/17  Yes Ena Dawley, Tiffany S, PA-C  losartan (COZAAR) 50 MG tablet Take 1 tablet (50 mg total) by mouth daily. 05/16/17  Yes Ena Dawley, Tiffany S, PA-C  metoprolol tartrate (LOPRESSOR) 25 MG tablet Take 1 tablet (25 mg total) by mouth 2 (two) times daily. 05/16/17  Yes Ena Dawley, Tiffany S, PA-C  ketoconazole (NIZORAL) 2 % cream Apply 1 application topically daily. Apply to affected areas. Patient not taking: Reported on 04/14/2017 01/17/17   Alfonse Spruce, FNP  multivitamin-iron-minerals-folic acid (CENTRUM) chewable tablet Chew 1 tablet by mouth daily.    [provider]  nitroGLYCERIN (NITROSTAT) 0.4 MG SL tablet Place 1 tablet (0.4 mg total) under the tongue every 5 (five) minutes as needed for chest pain. Patient not taking: Reported  on 05/11/2017 04/21/17   Georgette Shell, MD  pantoprazole (PROTONIX) 40 MG tablet Take 1 tablet (40 mg total) by mouth daily. Patient not taking: Reported on 05/11/2017 03/25/17   Alfonse Spruce, FNP  simvastatin (ZOCOR) 40 MG tablet Take 1 tablet (40 mg total) by mouth at bedtime. Patient not  taking: Reported on 05/11/2017 03/25/17   Alfonse Spruce, FNP  triamcinolone (KENALOG) 0.025 % ointment Apply 1 application topically 2 (two) times daily. Apply to affected areas. Patient not taking: Reported on 04/14/2017 01/17/17   Alfonse Spruce, FNP  vitamin C (ASCORBIC ACID) 500 MG tablet Take 500 mg by mouth daily.    [provider]    Assessment: 1. CAD  -2 V CAD  -s/p PCI DES LAD 2. HTN 3. ETOH WD-resolved 4. Smoker/Vapes  Plan: Continue guideline directed medical therapy and dual antiplatelet therapy Increase activity as tolerated Discontinue Smoking/Vaping Risk factor modification Appointment with Cardiology in 2-4 weeks  Follow up:4 weeks  The patient was given clear instructions to go to ER or return to medical center if symptoms don't improve, worsen or new problems develop. The patient verbalized understanding. The patient was told to call to get lab results if they haven't heard anything in the next week.   This note has been created with Surveyor, quantity. Any transcriptional errors are unintentional.   Zettie Pho, PA-C 05/11/2017, 11:58 AM

## 2017-05-16 MED FILL — ?CLOPIDOGREL 75MG TAB: 75 | 30 days supply | Qty: 30 | Fill #0

## 2017-05-17 NOTE — Progress Notes (Deleted)
Cardiology Office Note    Date:  05/17/2017   ID:  Richard Calhoun, DOB [birth year], MRN [id number]  PCP:  Alfonse Spruce, FNP  Cardiologist: Dr. Debara Pickett   No chief complaint on file.   History of Present Illness:    Richard Calhoun is a 56 y.o. male with past medical history of HTN, HLD, alcohol abuse, and tobacco use who presents to the office today for hospital follow-up.   He was recently admitted to [facility] from 9/27 - 04/21/2017 for evaluation of chest pain. Cardiac enzymes remained negative and his EKG showed no acute ischemic changes but due to his presenting symptoms of exertional chest pain and multiple cardiac risk factors, a cardiac catheterization was recommended for definitive evaluation. This was performed on 9/28 and showed severe 2 vessel coronary disease with calcific total occlusion of the mid right coronary over a long segment along with severe calcified coronary disease involving the proximal to mid LAD with 60-80% stenosis depending upon review and FFR positive at 0.74 beyond the second diagonal. The plan was for staged PCI of his LAD but this was delayed due to acute alcohol withdrawal requiring the use of Precedex. He returned to the cath lab on 10/3 and underwent successful orbital atherectomy and Synergy drug-eluting stent placement to the mid LAD. He was started on DAPT with ASA and Plavix along with being started on statin, Imdur, and BB therapy.      Past Medical History:  Diagnosis Date  . ALLERGIC RHINITIS   . ANXIETY   . BICEPS TENDON RUPTURE, RIGHT   . DEPENDENCE, ALCOHOL NEC/NOS, UNSPECIFIED   . DEPRESSION   . GERD   . HYPERLIPIDEMIA   . HYPERTENSION   . Impaired glucose tolerance   . OSTEOARTHRITIS   . Tubular adenoma of colon 06/2006    Past Surgical History:  Procedure Laterality Date  . CORONARY ATHERECTOMY N/A 04/20/2017   Procedure: CORONARY ATHERECTOMY;  Surgeon: Wellington Hampshire, MD;  Location: [facility];  Service:  Cardiovascular;  Laterality: N/A;  . CORONARY STENT INTERVENTION N/A 04/20/2017   Procedure: CORONARY STENT INTERVENTION;  Surgeon: Wellington Hampshire, MD;  Location: [facility];  Service: Cardiovascular;  Laterality: N/A;  . FOOT SURGERY    . INTRAVASCULAR PRESSURE WIRE/FFR STUDY N/A 04/15/2017   Procedure: INTRAVASCULAR PRESSURE WIRE/FFR STUDY;  Surgeon: Belva Crome, MD;  Location: [facility];  Service: Cardiovascular;  Laterality: N/A;  . LEFT HEART CATH AND CORONARY ANGIOGRAPHY N/A 04/15/2017   Procedure: LEFT HEART CATH AND CORONARY ANGIOGRAPHY;  Surgeon: Belva Crome, MD;  Location: [facility];  Service: Cardiovascular;  Laterality: N/A;  . MANDIBLE FRACTURE SURGERY      Current Medications: Outpatient Medications Prior to Visit  Medication Sig Dispense Refill  . aspirin EC 81 MG EC tablet Take 1 tablet (81 mg total) by mouth daily.    Marland Kitchen atorvastatin (LIPITOR) 80 MG tablet Take 1 tablet (80 mg total) by mouth daily at 6 PM. 30 tablet 5  . clopidogrel (PLAVIX) 75 MG tablet Take 1 tablet (75 mg total) by mouth daily. 30 tablet 5  . HYDROcodone-acetaminophen (NORCO/VICODIN) 5-325 MG tablet Take 1 tablet by mouth every 6 (six) hours as needed for moderate pain. 30 tablet 0  . isosorbide mononitrate (IMDUR) 30 MG 24 hr tablet Take 1 tablet (30 mg total) by mouth daily. 30 tablet 5  . ketoconazole (NIZORAL) 2 % cream Apply 1 application topically daily.  Apply to affected areas. (Patient not taking: Reported on 04/14/2017) 15 g 1  . losartan (COZAAR) 50 MG tablet Take 1 tablet (50 mg total) by mouth daily. 30 tablet 5  . metoprolol tartrate (LOPRESSOR) 25 MG tablet Take 1 tablet (25 mg total) by mouth 2 (two) times daily. 60 tablet 5  . multivitamin-iron-minerals-folic acid (CENTRUM) chewable tablet Chew 1 tablet by mouth daily.    . nitroGLYCERIN (NITROSTAT) 0.4 MG SL tablet Place 1 tablet (0.4 mg total) under the tongue every 5 (five) minutes as needed for chest pain.  (Patient not taking: Reported on 05/11/2017) 30 tablet 0  . pantoprazole (PROTONIX) 40 MG tablet Take 1 tablet (40 mg total) by mouth daily. (Patient not taking: Reported on 05/11/2017) 30 tablet 3  . simvastatin (ZOCOR) 40 MG tablet Take 1 tablet (40 mg total) by mouth at bedtime. (Patient not taking: Reported on 05/11/2017) 30 tablet 2  . triamcinolone (KENALOG) 0.025 % ointment Apply 1 application topically 2 (two) times daily. Apply to affected areas. (Patient not taking: Reported on 04/14/2017) 30 g 0  . vitamin C (ASCORBIC ACID) 500 MG tablet Take 500 mg by mouth daily.     No facility-administered medications prior to visit.      Allergies:   Penicillins   Social History   Social History  . Marital status: Single    Spouse name: N/A  . Number of children: N/A  . Years of education: N/A   Occupational History  . unemployed    Social History Main Topics  . Smoking status: Current Every Day Smoker  . Smokeless tobacco: Never Used     Comment: states he is smoking 1 pack 1/2 now that he is unemployed   . Alcohol use 0.0 oz/week  . Drug use: No  . Sexual activity: Not on file   Other Topics Concern  . Not on file   Social History Narrative  . No narrative on file     Family History:  The patient's ***family history includes Kidney disease in his sister; Stroke in his father and mother.   Review of Systems:   Please see the history of present illness.     General:  No chills, fever, night sweats or weight changes.  Cardiovascular:  No chest pain, dyspnea on exertion, edema, orthopnea, palpitations, paroxysmal nocturnal dyspnea. Dermatological: No rash, lesions/masses Respiratory: No cough, dyspnea Urologic: No hematuria, dysuria Abdominal:   No nausea, vomiting, diarrhea, bright red blood per rectum, melena, or hematemesis Neurologic:  No visual changes, wkns, changes in mental status. All other systems reviewed and are otherwise negative except as noted  above.   Physical Exam:    VS:  There were no vitals taken for this visit.   General: Well developed, well nourished,male appearing in no acute distress. Head: Normocephalic, atraumatic, sclera non-icteric, no xanthomas, nares are without discharge.  Neck: No carotid bruits. JVD not elevated.  Lungs: Respirations regular and unlabored, without wheezes or rales.  Heart: ***Regular rate and rhythm. No S3 or S4.  No murmur, no rubs, or gallops appreciated. Abdomen: Soft, non-tender, non-distended with normoactive bowel sounds. No hepatomegaly. No rebound/guarding. No obvious abdominal masses. Msk:  Strength and tone appear normal for age. No joint deformities or effusions. Extremities: No clubbing or cyanosis. No edema.  Distal pedal pulses are 2+ bilaterally. Neuro: Alert and oriented X 3. Moves all extremities spontaneously. No focal deficits noted. Psych:  Responds to questions appropriately with a normal affect. Skin: No rashes or lesions  noted  Wt Readings from Last 3 Encounters:  05/11/17 206 lb 9.6 oz (93.7 kg)  04/21/17 204 lb (92.5 kg)  03/25/17 205 lb 3.2 oz (93.1 kg)        Studies/Labs Reviewed:   EKG:  EKG is*** ordered today.  The ekg ordered today demonstrates ***  Recent Labs: 04/15/2017: TSH 3.117 04/20/2017: ALT 34; Magnesium 1.8 04/21/2017: BUN 16; Creatinine, Ser 0.92; Hemoglobin 10.8; Platelets PLATELET CLUMPING, SUGGEST RECOLLECTION OF SAMPLE IN CITRATE TUBE.; Potassium 4.6; Sodium 134   Lipid Panel    Component Value Date/Time   CHOL 189 04/15/2017 0234   TRIG 141 04/15/2017 0234   TRIG 70 07/18/2006 1301   HDL 62 04/15/2017 0234   CHOLHDL 3.0 04/15/2017 0234   VLDL 28 04/15/2017 0234   LDLCALC 99 04/15/2017 0234   LDLDIRECT 165 (H) 08/16/2013 1732    Additional studies/ records that were reviewed today include:   Cardiac Catheterization: 03/2017  Severe 2 vessel coronary disease with calcific total occlusion of the mid right coronary over a long  segment.  Severe calcified coronary disease involving the proximal to mid LAD with 60-80% stenosis depending upon review and FFR positive at 0.74 beyond the second diagonal.  Luminal irregularities with less than 50% stenosis in the circumflex.  Normal left ventricular systolic function. EF greater than 55%. Mildly elevated LVEDP, 18 mmHg.  RECOMMENDATIONS:   Very difficult clinical situation. I believe the patient is alcohol dependent. He has no insurance. He is reluctant to have any care because of expense. According to his girlfriend he may not be reliable with chronic medication compliance.  HEART TEAM APPROACH; discuss CABG versus LAD atherectomy followed by stenting with medical therapy for right coronary. Chronic medical therapy without revascularization is probably not a good option due to the large region at risk should the LAD close.  Aggressive blood pressure control and lipid management.  CIWA  Coronary Stent Intervention:04/20/2017  Mid RCA to Dist RCA lesion, 99 %stenosed.  Dist RCA lesion, 100 %stenosed.  Ost 1st Mrg to 1st Mrg lesion, 40 %stenosed.  A drug eluting stent was successfully placed.  Mid LAD lesion, 80 %stenosed.  Post intervention, there is a 0% residual stenosis.   Successful orbital atherectomy and Synergy drug-eluting stent placement to the mid LAD.  Recommendations: Dual antiplatelet therapy for at least 6 months. Aggressive treatment of risk factors.  Assessment:    No diagnosis found.   Plan:   In order of problems listed above:  1. ***    Medication Adjustments/Labs and Tests Ordered: Current medicines are reviewed at length with the patient today.  Concerns regarding medicines are outlined above.  Medication changes, Labs and Tests ordered today are listed in the Patient Instructions below. There are no Patient Instructions on file for this visit.   Signed, Erma Heritage, PA-C  05/17/2017 5:27 PM    [facility] [address], [place], [place]  [address] Phone: [phone]; Fax: [phone]  [address] [place], [place] [address] Phone: [phone]

## 2017-05-18 ENCOUNTER — Ambulatory Visit: Payer: Self-pay | Admitting: Student

## 2017-05-20 MED FILL — ISOSORBIDE MN ER 30 MG TAB: 30 | 30 days supply | Qty: 30 | Fill #0

## 2017-05-20 MED FILL — ATORVASTATIN 80 MG TABLET: 80 | 30 days supply | Qty: 30 | Fill #0

## 2017-05-20 MED FILL — LOSARTAN POTASSIUM 50 MG TA: 50 | 30 days supply | Qty: 30 | Fill #0

## 2017-05-26 ENCOUNTER — Ambulatory Visit: Payer: Self-pay | Admitting: Internal Medicine

## 2017-06-13 ENCOUNTER — Ambulatory Visit: Payer: Self-pay | Admitting: Family Medicine

## 2017-06-13 ENCOUNTER — Telehealth: Payer: Self-pay | Admitting: Family Medicine

## 2017-06-13 DIAGNOSIS — L304 Erythema intertrigo: Secondary | ICD-10-CM

## 2017-06-13 MED ORDER — TRIAMCINOLONE ACETONIDE 0.025 % EX OINT: 1.0000 "application " | TOPICAL_OINTMENT | Freq: Two times a day (BID) | CUTANEOUS | 0 refills | Status: DC

## 2017-06-13 MED FILL — TRIAMCINOLONE 0.025% OINT: 0.025 | 15 days supply | Qty: 30 | Fill #0

## 2017-06-13 NOTE — Telephone Encounter (Signed)
Refilled

## 2017-06-13 NOTE — Telephone Encounter (Signed)
Pt called to request a refill for  triamcinolone (KENALOG) 0.025 % ointment  Please follow up, he want the pharmacy at [facility]

## 2017-06-16 ENCOUNTER — Other Ambulatory Visit: Payer: Self-pay | Admitting: Family Medicine

## 2017-06-16 DIAGNOSIS — L304 Erythema intertrigo: Secondary | ICD-10-CM

## 2017-06-16 MED FILL — KETOCONAZOLE 2% CREAM: 2 | 15 days supply | Qty: 15 | Fill #1

## 2017-06-16 MED FILL — ISOSORBIDE MN ER 30 MG TAB: 30 | 30 days supply | Qty: 30 | Fill #1

## 2017-06-16 MED FILL — ?PANTOPRAZOLE SOD DR 40MG: 40 MG | 30 days supply | Qty: 30 | Fill #2

## 2017-06-16 MED FILL — ?CLOPIDOGREL 75MG TAB: 75 | 30 days supply | Qty: 30 | Fill #1

## 2017-06-21 ENCOUNTER — Encounter: Payer: Self-pay | Admitting: Family Medicine

## 2017-06-21 ENCOUNTER — Ambulatory Visit: Payer: Self-pay | Attending: Family Medicine | Admitting: Family Medicine

## 2017-06-21 VITALS — BP 108/61 | HR 81 | Temp 98.0°F | Resp 18 | Ht 67.0 in | Wt 212.0 lb

## 2017-06-21 DIAGNOSIS — Z7982 Long term (current) use of aspirin: Secondary | ICD-10-CM | POA: Insufficient documentation

## 2017-06-21 DIAGNOSIS — F17200 Nicotine dependence, unspecified, uncomplicated: Secondary | ICD-10-CM

## 2017-06-21 DIAGNOSIS — Z09 Encounter for follow-up examination after completed treatment for conditions other than malignant neoplasm: Secondary | ICD-10-CM

## 2017-06-21 DIAGNOSIS — I2510 Atherosclerotic heart disease of native coronary artery without angina pectoris: Secondary | ICD-10-CM | POA: Insufficient documentation

## 2017-06-21 DIAGNOSIS — I25119 Atherosclerotic heart disease of native coronary artery with unspecified angina pectoris: Secondary | ICD-10-CM

## 2017-06-21 DIAGNOSIS — I10 Essential (primary) hypertension: Secondary | ICD-10-CM | POA: Insufficient documentation

## 2017-06-21 DIAGNOSIS — K439 Ventral hernia without obstruction or gangrene: Secondary | ICD-10-CM | POA: Insufficient documentation

## 2017-06-21 DIAGNOSIS — F17290 Nicotine dependence, other tobacco product, uncomplicated: Secondary | ICD-10-CM | POA: Insufficient documentation

## 2017-06-21 DIAGNOSIS — E785 Hyperlipidemia, unspecified: Secondary | ICD-10-CM | POA: Insufficient documentation

## 2017-06-21 DIAGNOSIS — Z23 Encounter for immunization: Secondary | ICD-10-CM

## 2017-06-21 DIAGNOSIS — F1020 Alcohol dependence, uncomplicated: Secondary | ICD-10-CM

## 2017-06-21 DIAGNOSIS — Z79899 Other long term (current) drug therapy: Secondary | ICD-10-CM | POA: Insufficient documentation

## 2017-06-21 NOTE — Patient Instructions (Signed)
Increase water intake.  Heart-Healthy Eating Plan Heart-healthy meal planning includes:  Limiting unhealthy fats.  Increasing healthy fats.  Making other small dietary changes.  You may need to talk with your doctor or a diet specialist (dietitian) to create an eating plan that is right for you. What types of fat should I choose?  Choose healthy fats. These include olive oil and canola oil, flaxseeds, walnuts, almonds, and seeds.  Eat more omega-3 fats. These include salmon, mackerel, sardines, tuna, flaxseed oil, and ground flaxseeds. Try to eat fish at least twice each week.  Limit saturated fats. ? Saturated fats are often found in animal products, such as meats, butter, and cream. ? Plant sources of saturated fats include palm oil, palm kernel oil, and coconut oil.  Avoid foods with partially hydrogenated oils in them. These include stick margarine, some tub margarines, cookies, crackers, and other baked goods. These contain trans fats. What general guidelines do I need to follow?  Check food labels carefully. Identify foods with trans fats or high amounts of saturated fat.  Fill one half of your plate with vegetables and green salads. Eat 4-5 servings of vegetables per day. A serving of vegetables is: ? 1 cup of raw leafy vegetables. ?  cup of raw or cooked cut-up vegetables. ?  cup of vegetable juice.  Fill one fourth of your plate with whole grains. Look for the word "whole" as the first word in the ingredient list.  Fill one fourth of your plate with lean protein foods.  Eat 4-5 servings of fruit per day. A serving of fruit is: ? One medium whole fruit. ?  cup of dried fruit. ?  cup of fresh, frozen, or canned fruit. ?  cup of 100% fruit juice.  Eat more foods that contain soluble fiber. These include apples, broccoli, carrots, beans, peas, and barley. Try to get 20-30 g of fiber per day.  Eat more home-cooked food. Eat less restaurant, buffet, and fast  food.  Limit or avoid alcohol.  Limit foods high in starch and sugar.  Avoid fried foods.  Avoid frying your food. Try baking, boiling, grilling, or broiling it instead. You can also reduce fat by: ? Removing the skin from poultry. ? Removing all visible fats from meats. ? Skimming the fat off of stews, soups, and gravies before serving them. ? Steaming vegetables in water or broth.  Lose weight if you are overweight.  Eat 4-5 servings of nuts, legumes, and seeds per week: ? One serving of dried beans or legumes equals  cup after being cooked. ? One serving of nuts equals 1 ounces. ? One serving of seeds equals  ounce or one tablespoon.  You may need to keep track of how much salt or sodium you eat. This is especially true if you have high blood pressure. Talk with your doctor or dietitian to get more information. What foods can I eat? Grains Breads, including [place], white, pita, wheat, raisin, rye, oatmeal, and [place]. Tortillas that are neither fried nor made with lard or trans fat. Low-fat rolls, including hotdog and hamburger buns and English muffins. Biscuits. Muffins. Waffles. Pancakes. Light popcorn. Whole-grain cereals. Flatbread. Melba toast. Pretzels. Breadsticks. Rusks. Low-fat snacks. Low-fat crackers, including oyster, saltine, matzo, graham, animal, and rye. Rice and pasta, including brown rice and pastas that are made with whole wheat. Vegetables All vegetables. Fruits All fruits, but limit coconut. Meats and Other Protein Sources Lean, well-trimmed beef, veal, pork, and lamb. Chicken and [place] without skin.  All fish and shellfish. Wild duck, rabbit, pheasant, and venison. Egg whites or low-cholesterol egg substitutes. Dried beans, peas, lentils, and tofu. Seeds and most nuts. Dairy Low-fat or nonfat cheeses, including ricotta, string, and mozzarella. Skim or 1% milk that is liquid, powdered, or evaporated. Buttermilk that is made with low-fat milk. Nonfat or  low-fat yogurt. Beverages Mineral water. Diet carbonated beverages. Sweets and Desserts Sherbets and fruit ices. Honey, jam, marmalade, jelly, and syrups. Meringues and gelatins. Pure sugar candy, such as hard candy, jelly beans, gumdrops, mints, marshmallows, and small amounts of dark chocolate. [employer] Eat all sweets and desserts in moderation. Fats and Oils Nonhydrogenated (trans-free) margarines. Vegetable oils, including soybean, sesame, sunflower, olive, peanut, safflower, corn, canola, and cottonseed. Salad dressings or mayonnaise made with a vegetable oil. Limit added fats and oils that you use for cooking, baking, salads, and as spreads. Other Cocoa powder. Coffee and tea. All seasonings and condiments. The items listed above may not be a complete list of recommended foods or beverages. Contact your dietitian for more options. What foods are not recommended? Grains Breads that are made with saturated or trans fats, oils, or whole milk. Croissants. Butter rolls. Cheese breads. Sweet rolls. Donuts. Buttered popcorn. Chow mein noodles. High-fat crackers, such as cheese or butter crackers. Meats and Other Protein Sources Fatty meats, such as hotdogs, short ribs, sausage, spareribs, bacon, rib eye roast or steak, and mutton. High-fat deli meats, such as salami and bologna. Caviar. Domestic duck and goose. Organ meats, such as kidney, liver, sweetbreads, and heart. Dairy Cream, sour cream, cream cheese, and creamed cottage cheese. Whole-milk cheeses, including blue (bleu), Monterey Jack, [place], [place], American, [place], Swiss, cheddar, [place], and [place]. Whole or 2% milk that is liquid, evaporated, or condensed. Whole buttermilk. Cream sauce or high-fat cheese sauce. Yogurt that is made from whole milk. Beverages Regular sodas and juice drinks with added sugar. Sweets and Desserts Frosting. Pudding. Cookies. Cakes other than angel food cake. Candy that has milk chocolate or  white chocolate, hydrogenated fat, butter, coconut, or unknown ingredients. Buttered syrups. Full-fat ice cream or ice cream drinks. Fats and Oils Gravy that has suet, meat fat, or shortening. Cocoa butter, hydrogenated oils, palm oil, coconut oil, palm kernel oil. These can often be found in baked products, candy, fried foods, nondairy creamers, and whipped toppings. Solid fats and shortenings, including bacon fat, salt pork, lard, and butter. Nondairy cream substitutes, such as coffee creamers and sour cream substitutes. Salad dressings that are made of unknown oils, cheese, or sour cream. The items listed above may not be a complete list of foods and beverages to avoid. Contact your dietitian for more information. This information is not intended to replace advice given to you by your health care provider. Make sure you discuss any questions you have with your health care provider. Document Released: 01/04/2012 Document Revised: 12/11/2015 Document Reviewed: 12/27/2013 Elsevier Interactive Patient Education  [employer].

## 2017-06-21 NOTE — Progress Notes (Signed)
Patient is here for f/up  

## 2017-06-24 NOTE — Progress Notes (Signed)
Subjective:  Patient ID: Richard Calhoun, male    DOB: 1961/06/15  Age: 56 y.o. MRN: [id number]  CC: Follow-up   HPI Richard Calhoun presents for follow-up.  Past medical history includes hypertension, hyperlipidemia, and CAD.  Recent history of hospital admission in September for complaints of chest pain and dyspnea with exertion.  Workup discovered severe two-vessel coronary artery disease.  Recommendation for coronary artery bypass grafting but however patient began to develop withdrawal symptoms required Precedex drip ICU.  PCI with drug-eluting stent placement to the mid of the LAD was performed.  Recommended DAPT for at least 6 months.Patient denies chest pain, chest pressure/discomfort, claudication, dyspnea, lower extremity edema, orthopnea and palpitations. He reports following up with cardiologist.. He is adherent with medication. He does report history of SOB worsened with activity. He is a current smoker, he reports vaping. He denies any fevers, chills, or hemoptysis. History of ventral hernia.  Patient reports having for at least 15 years.  Referral recommended in the past from previous providers however patient refused.  Offered patient referral to general surgery however patient refused.  He denies any nausea, vomiting, or abdominal pain.   Outpatient Medications Prior to Visit  Medication Sig Dispense Refill  . aspirin EC 81 MG EC tablet Take 1 tablet (81 mg total) by mouth daily.    Marland Kitchen atorvastatin (LIPITOR) 80 MG tablet Take 1 tablet (80 mg total) by mouth daily at 6 PM. 30 tablet 5  . clopidogrel (PLAVIX) 75 MG tablet Take 1 tablet (75 mg total) by mouth daily. 30 tablet 5  . clopidogrel (PLAVIX) 75 MG tablet Use as directed 75 mg in the mouth or throat daily.  5  . HYDROcodone-acetaminophen (NORCO/VICODIN) 5-325 MG tablet Take 1 tablet by mouth every 6 (six) hours as needed for moderate pain. 30 tablet 0  . isosorbide mononitrate (IMDUR) 30 MG 24 hr tablet Take 1 tablet (30 mg  total) by mouth daily. 30 tablet 5  . ketoconazole (NIZORAL) 2 % cream APPLY 1 APPLICATION TOPICALLY DAILY. APPLY TO AFFECTED AREAS. 15 g 1  . losartan (COZAAR) 50 MG tablet Take 1 tablet (50 mg total) by mouth daily. 30 tablet 5  . metoprolol tartrate (LOPRESSOR) 25 MG tablet Take 1 tablet (25 mg total) by mouth 2 (two) times daily. 60 tablet 5  . metoprolol tartrate (LOPRESSOR) 25 MG tablet Use as directed 25 mg in the mouth or throat 2 (two) times daily.  2  . multivitamin-iron-minerals-folic acid (CENTRUM) chewable tablet Chew 1 tablet by mouth daily.    . nitroGLYCERIN (NITROSTAT) 0.4 MG SL tablet Place 1 tablet (0.4 mg total) under the tongue every 5 (five) minutes as needed for chest pain. (Patient not taking: Reported on 05/11/2017) 30 tablet 0  . pantoprazole (PROTONIX) 40 MG tablet Take 1 tablet (40 mg total) by mouth daily. (Patient not taking: Reported on 05/11/2017) 30 tablet 3  . simvastatin (ZOCOR) 40 MG tablet Take 1 tablet (40 mg total) by mouth at bedtime. (Patient not taking: Reported on 05/11/2017) 30 tablet 2  . triamcinolone (KENALOG) 0.025 % ointment Apply 1 application topically 2 (two) times daily. Apply to affected areas. 30 g 0  . vitamin C (ASCORBIC ACID) 500 MG tablet Take 500 mg by mouth daily.     No facility-administered medications prior to visit.     ROS Review of Systems  Constitutional: Negative.   HENT: Negative.   Eyes: Negative.   Respiratory: Negative.   Cardiovascular: Negative.  Gastrointestinal: Negative.   Skin: Negative.   Neurological: Negative.    Objective:  BP 108/61 (BP Location: Left Arm, Patient Position: Sitting, Cuff Size: Normal)   Pulse 81   Temp 98 F (36.7 C) (Oral)   Resp 18   Ht 5\' 7"  (1.702 m)   Wt 212 lb (96.2 kg)   HC 18" (45.7 cm)   SpO2 95%   BMI 33.20 kg/m   BP/Weight 06/21/2017 05/11/2017 07/25/5535  Systolic BP 482 707 867  Diastolic BP 61 70 66  Wt. (Lbs) 212 206.6 204  BMI 33.2 32.36 -    Physical Exam    Constitutional: He is oriented to person, place, and time. He appears well-developed and well-nourished.  HENT:  Head: Normocephalic and atraumatic.  Right Ear: External ear normal.  Left Ear: External ear normal.  Nose: Nose normal.  Mouth/Throat: Oropharynx is clear and moist.  Eyes: Conjunctivae are normal. Pupils are equal, round, and reactive to light.  Neck: Normal range of motion. Neck supple. No JVD present.  Cardiovascular: Normal rate, regular rhythm, normal heart sounds and intact distal pulses.  Pulmonary/Chest: Effort normal and breath sounds normal. No respiratory distress. He has no wheezes.  Abdominal: Soft. Bowel sounds are normal. There is no tenderness. A hernia is present. Hernia confirmed positive in the ventral area.    Musculoskeletal: Normal range of motion.  Lymphadenopathy:    He has no cervical adenopathy.  Neurological: He is alert and oriented to person, place, and time.  Skin: Skin is warm and dry.  Nursing note and vitals reviewed.  Assessment & Plan:   1. Follow up   2. Essential hypertension BP controlled on current regimen. - metoprolol tartrate (LOPRESSOR) 25 MG tablet; Use as directed 25 mg in the mouth or throat 2 (two) times daily.; Refill: 2  3. Coronary artery disease involving native coronary artery of native heart with angina pectoris (HCC)  - metoprolol tartrate (LOPRESSOR) 25 MG tablet; Use as directed 25 mg in the mouth or throat 2 (two) times daily.; Refill: 2 - clopidogrel (PLAVIX) 75 MG tablet; Use as directed 75 mg in the mouth or throat daily.; Refill: 5  4. Ventral hernia without obstruction or gangrene Offer referral to general surgery however patient refused.  5. Current smoker Patient is currently vaping, encourage patient and offered tobacco cessation materials however he is not ready to quit at this time.  6. Uncomplicated alcohol dependence (Richard Calhoun) Patient declines speaking with LCSW or receiving resources at this  time.  7. Needs flu shot  - Flu Vaccine QUAD 6+ mos PF IM (Fluarix Quad PF)      Follow-up: Return in about 3 months (around 09/19/2017) for HTN/ CAD.   Alfonse Spruce FNP

## 2017-06-27 ENCOUNTER — Ambulatory Visit: Payer: Self-pay | Admitting: Internal Medicine

## 2017-07-08 ENCOUNTER — Encounter: Payer: Self-pay | Admitting: Family Medicine

## 2017-07-08 ENCOUNTER — Ambulatory Visit: Payer: Self-pay | Attending: Family Medicine | Admitting: Family Medicine

## 2017-07-08 VITALS — Resp 22 | Ht 67.0 in | Wt 211.4 lb

## 2017-07-08 DIAGNOSIS — H60502 Unspecified acute noninfective otitis externa, left ear: Secondary | ICD-10-CM | POA: Insufficient documentation

## 2017-07-08 DIAGNOSIS — Z7982 Long term (current) use of aspirin: Secondary | ICD-10-CM | POA: Insufficient documentation

## 2017-07-08 DIAGNOSIS — I25119 Atherosclerotic heart disease of native coronary artery with unspecified angina pectoris: Secondary | ICD-10-CM | POA: Insufficient documentation

## 2017-07-08 DIAGNOSIS — Z7902 Long term (current) use of antithrombotics/antiplatelets: Secondary | ICD-10-CM | POA: Insufficient documentation

## 2017-07-08 DIAGNOSIS — E785 Hyperlipidemia, unspecified: Secondary | ICD-10-CM | POA: Insufficient documentation

## 2017-07-08 DIAGNOSIS — R42 Dizziness and giddiness: Secondary | ICD-10-CM | POA: Insufficient documentation

## 2017-07-08 DIAGNOSIS — Z79899 Other long term (current) drug therapy: Secondary | ICD-10-CM | POA: Insufficient documentation

## 2017-07-08 DIAGNOSIS — I10 Essential (primary) hypertension: Secondary | ICD-10-CM | POA: Insufficient documentation

## 2017-07-08 DIAGNOSIS — H9312 Tinnitus, left ear: Secondary | ICD-10-CM | POA: Insufficient documentation

## 2017-07-08 DIAGNOSIS — Z79891 Long term (current) use of opiate analgesic: Secondary | ICD-10-CM | POA: Insufficient documentation

## 2017-07-08 MED ORDER — ASPIRIN 81 MG PO TBEC: 81.0000 mg | DELAYED_RELEASE_TABLET | Freq: Every day | ORAL | 11 refills | Status: DC

## 2017-07-08 MED ORDER — METOPROLOL TARTRATE 25 MG PO TABS: 25.0000 mg | ORAL_TABLET | Freq: Two times a day (BID) | ORAL | 5 refills | Status: DC

## 2017-07-08 MED ORDER — CLOPIDOGREL BISULFATE 75 MG PO TABS: 75.0000 mg | ORAL_TABLET | Freq: Every day | ORAL | 5 refills | Status: DC

## 2017-07-08 MED ORDER — OFLOXACIN 0.3 % OT SOLN: 10.0000 [drp] | Freq: Every day | OTIC | 0 refills | Status: DC

## 2017-07-08 MED ORDER — ISOSORBIDE MONONITRATE ER 30 MG PO TB24: 30.0000 mg | ORAL_TABLET | Freq: Every day | ORAL | 5 refills | Status: DC

## 2017-07-08 MED ORDER — LOSARTAN POTASSIUM 50 MG PO TABS: 50.0000 mg | ORAL_TABLET | Freq: Every day | ORAL | 5 refills | Status: DC

## 2017-07-08 MED ORDER — ATORVASTATIN CALCIUM 80 MG PO TABS: 80.0000 mg | ORAL_TABLET | Freq: Every day | ORAL | 5 refills | Status: DC

## 2017-07-08 MED FILL — ?METOPROLOL 25 MG TABLET: 25 | 30 days supply | Qty: 60 | Fill #0

## 2017-07-08 MED FILL — OFLOXACIN 0.3% EYE DROPS: 0.3 | 5 days supply | Qty: 5 | Fill #0

## 2017-07-08 MED FILL — LOSARTAN POTASSIUM 50 MG TA: 50 | 30 days supply | Qty: 30 | Fill #0

## 2017-07-08 MED FILL — ?ISOSORBIDE MN ER 30 MG TAB: 30 | 30 days supply | Qty: 30 | Fill #0

## 2017-07-08 MED FILL — ATORVASTATIN 80 MG TABLET: 80 | 30 days supply | Qty: 30 | Fill #0

## 2017-07-08 MED FILL — ?CLOPIDOGREL 75MG TABLET: 75 | 30 days supply | Qty: 30 | Fill #0

## 2017-07-08 NOTE — Patient Instructions (Addendum)
Go to the Emergency Department for further evaluation for.    Otitis Externa Otitis externa is an infection of the outer ear canal. The outer ear canal is the area between the outside of the ear and the eardrum. Otitis externa is sometimes called "swimmer's ear." Follow these instructions at home:  If you were given antibiotic ear drops, use them as told by your doctor. Do not stop using them even if your condition gets better.  Take over-the-counter and prescription medicines only as told by your doctor.  Keep all follow-up visits as told by your doctor. This is important. How is this prevented?  Keep your ear dry. Use the corner of a towel to dry your ear after you swim or bathe.  Try not to scratch or put things in your ear. Doing these things makes it easier for germs to grow in your ear.  Avoid swimming in lakes, dirty water, or pools that may not have the right amount of a chemical called chlorine.  Consider making ear drops and putting 3 or 4 drops in each ear after you swim. Ask your doctor about how you can make ear drops. Contact a doctor if:  You have a fever.  After 3 days your ear is still red, swollen, or painful.  After 3 days you still have pus coming from your ear.  Your redness, swelling, or pain gets worse.  You have a really bad headache.  You have redness, swelling, pain, or tenderness behind your ear. This information is not intended to replace advice given to you by your health care provider. Make sure you discuss any questions you have with your health care provider. Document Released: 12/22/2007 Document Revised: 07/31/2015 Document Reviewed: 04/14/2015 Elsevier Interactive Patient Education  [employer].

## 2017-07-08 NOTE — Progress Notes (Signed)
Subjective:  Patient ID: Richard Calhoun, male    DOB: 12-09-1960  Age: 56 y.o. MRN: [id number]  CC: Tinnitus and Dizziness   HPI Richard Calhoun presents for follow-up.  Past medical history includes hypertension, hyperlipidemia, and CAD.  Recent history of hospital admission in September for complaints of chest pain and dyspnea with exertion.  Workup discovered severe two-vessel coronary artery disease.  Recommendation for coronary artery bypass grafting but however patient began to develop withdrawal symptoms required Precedex drip ICU. PCI with drug-eluting stent placement to the mid of the LAD was performed. Recommended DAPT for at least 6 months. He does complain of lightheadedness that comes and goes he denies any near syncopal or syncope episodes. BP found to be elevated in office. Patient denies chest pain, chest pressure/discomfort, claudication, dyspnea, lower extremity edema, orthopnea and palpitations.  He is adherent with medication.  He is a current smoker, he reports vaping. Tinnitus: Onset 1 week ago. Associated symptoms include tenderness. He denies any fevers, drainage from the ear, or recent history of fluid in the ear, or decreased hearing.   Outpatient Medications Prior to Visit  Medication Sig Dispense Refill  . clopidogrel (PLAVIX) 75 MG tablet Use as directed 75 mg in the mouth or throat daily.  5  . ketoconazole (NIZORAL) 2 % cream APPLY 1 APPLICATION TOPICALLY DAILY. APPLY TO AFFECTED AREAS. 15 g 1  . multivitamin-iron-minerals-folic acid (CENTRUM) chewable tablet Chew 1 tablet by mouth daily.    . nitroGLYCERIN (NITROSTAT) 0.4 MG SL tablet Place 1 tablet (0.4 mg total) under the tongue every 5 (five) minutes as needed for chest pain. 30 tablet 0  . pantoprazole (PROTONIX) 40 MG tablet Take 1 tablet (40 mg total) by mouth daily. 30 tablet 3  . triamcinolone (KENALOG) 0.025 % ointment Apply 1 application topically 2 (two) times daily. Apply to affected areas. 30 g 0  .  vitamin C (ASCORBIC ACID) 500 MG tablet Take 500 mg by mouth daily.    Marland Kitchen aspirin EC 81 MG EC tablet Take 1 tablet (81 mg total) by mouth daily.    Marland Kitchen atorvastatin (LIPITOR) 80 MG tablet Take 1 tablet (80 mg total) by mouth daily at 6 PM. 30 tablet 5  . clopidogrel (PLAVIX) 75 MG tablet Take 1 tablet (75 mg total) by mouth daily. 30 tablet 5  . isosorbide mononitrate (IMDUR) 30 MG 24 hr tablet Take 1 tablet (30 mg total) by mouth daily. 30 tablet 5  . losartan (COZAAR) 50 MG tablet Take 1 tablet (50 mg total) by mouth daily. 30 tablet 5  . metoprolol tartrate (LOPRESSOR) 25 MG tablet Take 1 tablet (25 mg total) by mouth 2 (two) times daily. 60 tablet 5  . metoprolol tartrate (LOPRESSOR) 25 MG tablet Use as directed 25 mg in the mouth or throat 2 (two) times daily.  2  . simvastatin (ZOCOR) 40 MG tablet Take 1 tablet (40 mg total) by mouth at bedtime. 30 tablet 2  . HYDROcodone-acetaminophen (NORCO/VICODIN) 5-325 MG tablet Take 1 tablet by mouth every 6 (six) hours as needed for moderate pain. 30 tablet 0   No facility-administered medications prior to visit.     ROS Review of Systems  Constitutional: Negative.   HENT: Positive for ear pain and tinnitus. Negative for ear discharge.   Eyes: Negative for discharge.  Respiratory: Negative.   Cardiovascular: Negative.   Skin: Negative.   Neurological: Positive for light-headedness.   Objective:  Resp (!) 22   Ht 5'  7" (1.702 m)   Wt 211 lb 6.4 oz (95.9 kg)   SpO2 95%   BMI 33.11 kg/m   BP/Weight 07/08/2017 06/21/2017 19/37/9024  Systolic BP 097 353 299  Diastolic BP 242 61 70  Wt. (Lbs) 211.4 212 206.6  BMI 33.11 33.2 32.36   Physical Exam  Constitutional: He is oriented to person, place, and time. He appears well-developed and well-nourished.  HENT:  Head: Normocephalic and atraumatic.  Right Ear: Hearing, tympanic membrane, external ear and ear canal normal.  Left Ear: Hearing normal. There is swelling and tenderness (erythema to  inner ear canal. ).  Nose: Nose normal.  Mouth/Throat: Oropharynx is clear and moist.  Eyes: Conjunctivae are normal. Pupils are equal, round, and reactive to light.  Neck: Normal range of motion. Neck supple. No JVD present.  Cardiovascular: Normal rate, regular rhythm, normal heart sounds and intact distal pulses.  Pulmonary/Chest: Effort normal and breath sounds normal. No respiratory distress.  faint rhonchi present  Abdominal: Soft. Bowel sounds are normal. There is no tenderness.  Musculoskeletal: Normal range of motion.  Lymphadenopathy:    He has no cervical adenopathy.  Neurological: He is alert and oriented to person, place, and time.  Skin: Skin is warm and dry.  Nursing note and vitals reviewed.   Assessment & Plan:   1. Essential hypertension  - losartan (COZAAR) 50 MG tablet; Take 1 tablet (50 mg total) by mouth daily.  Dispense: 30 tablet; Refill: 5 - metoprolol tartrate (LOPRESSOR) 25 MG tablet; Take 1 tablet (25 mg total) by mouth 2 (two) times daily.  Dispense: 60 tablet; Refill: 5 - isosorbide mononitrate (IMDUR) 30 MG 24 hr tablet; Take 1 tablet (30 mg total) by mouth daily.  Dispense: 30 tablet; Refill: 5  2. Acute otitis externa of left ear, unspecified type  - ofloxacin (FLOXIN OTIC) 0.3 % OTIC solution; Place 10 drops into both ears daily. For 7 days.  Dispense: 5 mL; Refill: 0  3. Uncontrolled hypertension -Significant increase in BP from baseline. Cardiac history includes CAD with severe 2 vessel disease. Patient reports adherence with BP medications. -EKG performed, findings abnormal. -Referred to ED for further workup. -Called and gave report to Gerald Stabs, RN front desk [facility] ED.   4. Coronary artery disease involving native coronary artery of native heart with angina pectoris ([place])  - atorvastatin (LIPITOR) 80 MG tablet; Take 1 tablet (80 mg total) by mouth daily at 6 PM.  Dispense: 30 tablet; Refill: 5 - aspirin 81 MG EC tablet; Take 1 tablet (81 mg total)  by mouth daily.  Dispense: 30 tablet; Refill: 11 - clopidogrel (PLAVIX) 75 MG tablet; Take 1 tablet (75 mg total) by mouth daily.  Dispense: 30 tablet; Refill: 5  5. Tinnitus of left ear   Follow-up: Return for Go to the ED .   Alfonse Spruce FNP

## 2017-07-15 MED FILL — ?PANTOPRAZOLE SOD DR 40MG: 40 MG | 30 days supply | Qty: 30 | Fill #3

## 2017-07-20 ENCOUNTER — Encounter: Payer: Self-pay | Admitting: Internal Medicine

## 2017-07-20 ENCOUNTER — Ambulatory Visit (INDEPENDENT_AMBULATORY_CARE_PROVIDER_SITE_OTHER): Payer: No Typology Code available for payment source | Admitting: Internal Medicine

## 2017-07-20 VITALS — BP 180/102 | HR 87 | Ht 67.0 in | Wt 211.0 lb

## 2017-07-20 DIAGNOSIS — I10 Essential (primary) hypertension: Secondary | ICD-10-CM

## 2017-07-20 DIAGNOSIS — I2510 Atherosclerotic heart disease of native coronary artery without angina pectoris: Secondary | ICD-10-CM

## 2017-07-20 DIAGNOSIS — Z955 Presence of coronary angioplasty implant and graft: Secondary | ICD-10-CM | POA: Insufficient documentation

## 2017-07-20 DIAGNOSIS — Z79899 Other long term (current) drug therapy: Secondary | ICD-10-CM

## 2017-07-20 DIAGNOSIS — E785 Hyperlipidemia, unspecified: Secondary | ICD-10-CM

## 2017-07-20 MED ORDER — HYDROCHLOROTHIAZIDE 25 MG PO TABS
25.0000 mg | ORAL_TABLET | Freq: Every day | ORAL | 3 refills | Status: DC
Start: 2017-07-20 — End: 2017-08-19

## 2017-07-20 MED ORDER — LOSARTAN POTASSIUM 100 MG PO TABS: 100.0000 mg | ORAL_TABLET | Freq: Every day | ORAL | 3 refills | Status: DC

## 2017-07-20 MED FILL — HYDROCHLOROTHIAZIDE 25 MG T: 25 | 30 days supply | Qty: 30 | Fill #0

## 2017-07-20 MED FILL — LOSARTAN POTASSIUM 100 MG T: 100 | 30 days supply | Qty: 30 | Fill #0

## 2017-07-20 NOTE — Patient Instructions (Signed)
Your physician has recommended you make the following change in your medication:  -- INCREASE losartan to 100mg  daily -- START hydrochlorothiazide 25mg  daily  Your physician recommends that you return for lab work in [place] recommends that you schedule a follow-up appointment in: 2-3 weeks with Dr. Debara Pickett

## 2017-07-20 NOTE — Progress Notes (Signed)
OFFICE CONSULT NOTE  Chief Complaint:  Follow-up catheterization  Primary Care Physician: Alfonse Spruce, FNP  HPI:  Richard Calhoun is a 57 y.o. male who is being seen today for the evaluation of tachycardia at the request of Hairston, Mandesia R, F*. [name] presents today for evaluation of tachycardia and syncope. He has a past medical history significant for chronic pain, dyslipidemia, hypertension and an 80-pack-year smoking history, currently he is quit but uses baby. He also has significant alcohol dependence drinking at least a fifth of rum daily. He has not worked in some time. Recently he's been having some tachycardia and passed out. He felt his heart racing at times which would happen spontaneously and he would become more short of breath and then dizzy. He said the dizziness progressed the point where he was either presyncopal and one time actually did pass out. He had a recent echo which showed normal systolic function and moderate LVH with diastolic dysfunction. He apparently was started on metoprolol tartrate 12.5 mg twice a day. He said since starting this medicine his heart rate is much lower and he feels almost no palpitations anymore. In general he feels much better. Blood pressure is also better controlled today 138/70. Heart rate is 85. He denies any chest pain. There is no significant history of heart disease in the family although his mother did die with a stroke at advanced age. He does have a sister who had kidney transplant who lives in [place].   07/20/2017  Mr. [name] returns today for follow-up of his recent hospitalization for chest pain.  When I saw him in the office he was complaining of tachycardia and was started on some beta-blocker.  An echo showed no systolic dysfunction or regional wall motion abnormalities.  Subsequently developed chest pain and presented to the emergency department.  He was referred for cardiac catheterization and was found to have  two-vessel coronary disease with a 90% LAD stenosis and a 100% RCA occlusion which was collateralized.  There was some indistinct behavior abnormalities during his admission which was thought to be due to alcohol withdrawal however he claims that he was overmedicated and does not drink enough alcohol to lead to any withdrawal.  This initially precluded coronary intervention however he did undergo intervention to the LAD by Dr. Fletcher Anon.  He reports compliance with his medications after discharge except for the metoprolol which he is only taking once a day.  Recently saw his primary care provider prior to Christmas and had extremely high blood pressure.  He was referred to the emergency department however he said with lack of health insurance and the way that he was "treated" he did not want to go to the emergency department.  He says he also owes [place] about $85,000.  PMHx:  Past Medical History:  Diagnosis Date  . ALLERGIC RHINITIS   . ANXIETY   . BICEPS TENDON RUPTURE, RIGHT   . DEPENDENCE, ALCOHOL NEC/NOS, UNSPECIFIED   . DEPRESSION   . GERD   . HYPERLIPIDEMIA   . HYPERTENSION   . Impaired glucose tolerance   . OSTEOARTHRITIS   . Tubular adenoma of colon 06/2006    Past Surgical History:  Procedure Laterality Date  . CORONARY ATHERECTOMY N/A 04/20/2017   Procedure: CORONARY ATHERECTOMY;  Surgeon: Wellington Hampshire, MD;  Location: [facility];  Service: Cardiovascular;  Laterality: N/A;  . CORONARY STENT INTERVENTION N/A 04/20/2017   Procedure: CORONARY STENT INTERVENTION;  Surgeon: Wellington Hampshire,  MD;  Location: [facility];  Service: Cardiovascular;  Laterality: N/A;  . FOOT SURGERY    . INTRAVASCULAR PRESSURE WIRE/FFR STUDY N/A 04/15/2017   Procedure: INTRAVASCULAR PRESSURE WIRE/FFR STUDY;  Surgeon: Belva Crome, MD;  Location: [facility];  Service: Cardiovascular;  Laterality: N/A;  . LEFT HEART CATH AND CORONARY ANGIOGRAPHY N/A 04/15/2017   Procedure: LEFT  HEART CATH AND CORONARY ANGIOGRAPHY;  Surgeon: Belva Crome, MD;  Location: [facility];  Service: Cardiovascular;  Laterality: N/A;  . MANDIBLE FRACTURE SURGERY      FAMHx:  Family History  Problem Relation Age of Onset  . Kidney disease Sister   . Stroke Mother   . Stroke Father     SOCHx:   reports that he quit smoking about 2 years ago. he has never used smokeless tobacco. He reports that he drinks alcohol. He reports that he does not use drugs.  ALLERGIES:  Allergies  Allergen Reactions  . Penicillins Other (See Comments)    Childhood reaction Has patient had a PCN reaction causing immediate rash, facial/tongue/throat swelling, SOB or lightheadedness with hypotension: Unknown Has patient had a PCN reaction causing severe rash involving mucus membranes or skin necrosis: Unknown Has patient had a PCN reaction that required hospitalization: No Has patient had a PCN reaction occurring within the last 10 years: No If all of the above answers are "NO", then may proceed with Cephalosporin use.     ROS: Pertinent items noted in HPI and remainder of comprehensive ROS otherwise negative.  HOME MEDS: Current Outpatient Medications on File Prior to Visit  Medication Sig Dispense Refill  . aspirin 81 MG EC tablet Take 1 tablet (81 mg total) by mouth daily. 30 tablet 11  . atorvastatin (LIPITOR) 80 MG tablet Take 1 tablet (80 mg total) by mouth daily at 6 PM. 30 tablet 5  . clopidogrel (PLAVIX) 75 MG tablet Take 1 tablet (75 mg total) by mouth daily. 30 tablet 5  . HYDROcodone-acetaminophen (NORCO/VICODIN) 5-325 MG tablet Take 1 tablet by mouth every 6 (six) hours as needed for moderate pain. 30 tablet 0  . isosorbide mononitrate (IMDUR) 30 MG 24 hr tablet Take 1 tablet (30 mg total) by mouth daily. 30 tablet 5  . ketoconazole (NIZORAL) 2 % cream APPLY 1 APPLICATION TOPICALLY DAILY. APPLY TO AFFECTED AREAS. 15 g 1  . metoprolol tartrate (LOPRESSOR) 25 MG tablet Take 1 tablet  (25 mg total) by mouth 2 (two) times daily. 60 tablet 5  . multivitamin-iron-minerals-folic acid (CENTRUM) chewable tablet Chew 1 tablet by mouth daily.    . nitroGLYCERIN (NITROSTAT) 0.4 MG SL tablet Place 1 tablet (0.4 mg total) under the tongue every 5 (five) minutes as needed for chest pain. 30 tablet 0  . ofloxacin (FLOXIN OTIC) 0.3 % OTIC solution Place 10 drops into both ears daily. For 7 days. 5 mL 0  . pantoprazole (PROTONIX) 40 MG tablet Take 1 tablet (40 mg total) by mouth daily. 30 tablet 3  . triamcinolone (KENALOG) 0.025 % ointment Apply 1 application topically 2 (two) times daily. Apply to affected areas. 30 g 0  . vitamin C (ASCORBIC ACID) 500 MG tablet Take 500 mg by mouth daily.     No current facility-administered medications on file prior to visit.     LABS/IMAGING: No results found for this or any previous visit (from the past 48 hour(s)). No results found.  LIPID PANEL:    Component Value Date/Time   CHOL  189 04/15/2017 0234   TRIG 141 04/15/2017 0234   TRIG 70 07/18/2006 1301   HDL 62 04/15/2017 0234   CHOLHDL 3.0 04/15/2017 0234   VLDL 28 04/15/2017 0234   LDLCALC 99 04/15/2017 0234   LDLDIRECT 165 (H) 08/16/2013 1732    WEIGHTS: Wt Readings from Last 3 Encounters:  07/20/17 211 lb (95.7 kg)  07/08/17 211 lb 6.4 oz (95.9 kg)  06/21/17 212 lb (96.2 kg)    VITALS: BP (!) 180/102   Pulse 87   Ht 5\' 7"  (1.702 m)   Wt 211 lb (95.7 kg)   BMI 33.05 kg/m   EXAM: General appearance: alert and no distress Neck: no carotid bruit, no JVD and thyroid not enlarged, symmetric, no tenderness/mass/nodules Lungs: clear to auscultation bilaterally Heart: regular rate and rhythm Abdomen: soft, non-tender; bowel sounds normal; no masses,  no organomegaly Extremities: extremities normal, atraumatic, no cyanosis or edema Pulses: 2+ and symmetric Skin: Skin color, texture, turgor normal. No rashes or lesions Neurologic: Grossly normal Psych:  Pleasant  EKG: Normal sinus rhythm 87-personally reviewed  ASSESSMENT: 1. Coronary artery disease with an occluded RCA collateralized and a 90% LAD stenosis status post DES (04/2017) 2. Hypertensive heart disease 3. Hypertension 4. Dyslipidemia 5. Alcohol dependence - recently quit 6. Former smoker (now [employer])  PLAN: 1.   Mr. Arredondo resented initially with unexplained tachycardia and normal echo findings, then had chest pain and was found to have two-vessel coronary disease with an occluded right coronary that was collateralized and 90% LAD stenosis which ultimately was stented with a drug-eluting stent.  Since then he has had 1 or 2 episodes of chest discomfort.  It may be associated to malignant hypertension.  Blood pressure today was 180/102 and recently has been over 947 systolic over 096 diastolic.  Better medical therapy may improve his symptoms.  I advised doubling his losartan to 100 mg daily and adding HCTZ 25 mg daily.  If this helps to control his blood pressure I may combine the medications to simplify his regimen.  Plan follow-up with me in 2-3 weeks.  Pixie Casino, MD, [place]  Attending Cardiologist  Direct Dial: [phone]  Fax: [phone]  Website:  [email or web address] 07/20/2017, 1:28 PM

## 2017-08-02 ENCOUNTER — Ambulatory Visit (INDEPENDENT_AMBULATORY_CARE_PROVIDER_SITE_OTHER): Payer: No Typology Code available for payment source | Admitting: Internal Medicine

## 2017-08-02 ENCOUNTER — Encounter: Payer: Self-pay | Admitting: Internal Medicine

## 2017-08-02 ENCOUNTER — Ambulatory Visit: Payer: Self-pay | Admitting: Internal Medicine

## 2017-08-02 VITALS — BP 122/73 | HR 95 | Ht 67.0 in | Wt 215.4 lb

## 2017-08-02 DIAGNOSIS — I119 Hypertensive heart disease without heart failure: Secondary | ICD-10-CM

## 2017-08-02 DIAGNOSIS — I2510 Atherosclerotic heart disease of native coronary artery without angina pectoris: Secondary | ICD-10-CM

## 2017-08-02 DIAGNOSIS — I10 Essential (primary) hypertension: Secondary | ICD-10-CM

## 2017-08-02 DIAGNOSIS — Z955 Presence of coronary angioplasty implant and graft: Secondary | ICD-10-CM

## 2017-08-02 NOTE — Patient Instructions (Signed)
Your physician wants you to follow-up in: 6 months with Dr. Hilty. You will receive a reminder letter in the mail two months in advance. If you don't receive a letter, please call our office to schedule the follow-up appointment.    

## 2017-08-02 NOTE — Progress Notes (Signed)
OFFICE CONSULT NOTE  Chief Complaint:  No complaints  Primary Care Physician: Alfonse Spruce, FNP  HPI:  Richard Calhoun is a 57 y.o. male who is being seen today for the evaluation of tachycardia at the request of Richard Calhoun, F*. [name] presents today for evaluation of tachycardia and syncope. He has a past medical history significant for chronic pain, dyslipidemia, hypertension and an 80-pack-year smoking history, currently he is quit but uses baby. He also has significant alcohol dependence drinking at least a fifth of rum daily. He has not worked in some time. Recently he's been having some tachycardia and passed out. He felt his heart racing at times which would happen spontaneously and he would become more short of breath and then dizzy. He said the dizziness progressed the point where he was either presyncopal and one time actually did pass out. He had a recent echo which showed normal systolic function and moderate LVH with diastolic dysfunction. He apparently was started on metoprolol tartrate 12.5 mg twice a day. He said since starting this medicine his heart rate is much lower and he feels almost no palpitations anymore. In general he feels much better. Blood pressure is also better controlled today 138/70. Heart rate is 85. He denies any chest pain. There is no significant history of heart disease in the family although his mother did die with a stroke at advanced age. He does have a sister who had kidney transplant who lives in [place].   07/20/2017  Richard Calhoun returns today for follow-up of his recent hospitalization for chest pain.  When I saw him in the office he was complaining of tachycardia and was started on some beta-blocker.  An echo showed no systolic dysfunction or regional wall motion abnormalities.  Subsequently developed chest pain and presented to the emergency department.  He was referred for cardiac catheterization and was found to have two-vessel coronary  disease with a 90% LAD stenosis and a 100% RCA occlusion which was collateralized.  There was some indistinct behavior abnormalities during his admission which was thought to be due to alcohol withdrawal however he claims that he was overmedicated and does not drink enough alcohol to lead to any withdrawal.  This initially precluded coronary intervention however he did undergo intervention to the LAD by Dr. Fletcher Anon.  He reports compliance with his medications after discharge except for the metoprolol which he is only taking once a day.  Recently saw his primary care provider prior to Christmas and had extremely high blood pressure.  He was referred to the emergency department however he said with lack of health insurance and the way that he was "treated" he did not want to go to the emergency department.  He says he also owes  about $85,000.  08/02/2017  [name] returns today for follow-up.  He has had marked improvement in his blood pressure on the changes with his medications.  Today's blood pressure is 122/73 and is generally appeared to be like this at home.  He is very surprised as he is used to his blood pressure running typically in the 160V systolic.  The other day he did have some lightheadedness, and I wonder if this could have been perhaps overtreatment.  He said he did feel better after eating.  PMHx:  Past Medical History:  Diagnosis Date  . ALLERGIC RHINITIS   . ANXIETY   . BICEPS TENDON RUPTURE, RIGHT   . DEPENDENCE, ALCOHOL NEC/NOS, UNSPECIFIED   .  DEPRESSION   . GERD   . HYPERLIPIDEMIA   . HYPERTENSION   . Impaired glucose tolerance   . OSTEOARTHRITIS   . Tubular adenoma of colon 06/2006    Past Surgical History:  Procedure Laterality Date  . CORONARY ATHERECTOMY N/A 04/20/2017   Procedure: CORONARY ATHERECTOMY;  Surgeon: Wellington Hampshire, MD;  Location: [facility];  Service: Cardiovascular;  Laterality: N/A;  . CORONARY STENT INTERVENTION N/A 04/20/2017     Procedure: CORONARY STENT INTERVENTION;  Surgeon: Wellington Hampshire, MD;  Location: [facility];  Service: Cardiovascular;  Laterality: N/A;  . FOOT SURGERY    . INTRAVASCULAR PRESSURE WIRE/FFR STUDY N/A 04/15/2017   Procedure: INTRAVASCULAR PRESSURE WIRE/FFR STUDY;  Surgeon: Belva Crome, MD;  Location: [facility];  Service: Cardiovascular;  Laterality: N/A;  . LEFT HEART CATH AND CORONARY ANGIOGRAPHY N/A 04/15/2017   Procedure: LEFT HEART CATH AND CORONARY ANGIOGRAPHY;  Surgeon: Belva Crome, MD;  Location: [facility];  Service: Cardiovascular;  Laterality: N/A;  . MANDIBLE FRACTURE SURGERY      FAMHx:  Family History  Problem Relation Age of Onset  . Kidney disease Sister   . Stroke Mother   . Stroke Father     SOCHx:   reports that he quit smoking about 2 years ago. he has never used smokeless tobacco. He reports that he drinks alcohol. He reports that he does not use drugs.  ALLERGIES:  Allergies  Allergen Reactions  . Penicillins Other (See Comments)    Childhood reaction Has patient had a PCN reaction causing immediate rash, facial/tongue/throat swelling, SOB or lightheadedness with hypotension: Unknown Has patient had a PCN reaction causing severe rash involving mucus membranes or skin necrosis: Unknown Has patient had a PCN reaction that required hospitalization: No Has patient had a PCN reaction occurring within the last 10 years: No If all of the above answers are "NO", then may proceed with Cephalosporin use.     ROS: Pertinent items noted in HPI and remainder of comprehensive ROS otherwise negative.  HOME MEDS: Current Outpatient Medications on File Prior to Visit  Medication Sig Dispense Refill  . aspirin 81 MG EC tablet Take 1 tablet (81 mg total) by mouth daily. 30 tablet 11  . atorvastatin (LIPITOR) 80 MG tablet Take 1 tablet (80 mg total) by mouth daily at 6 PM. 30 tablet 5  . clopidogrel (PLAVIX) 75 MG tablet Take 1 tablet (75 mg  total) by mouth daily. 30 tablet 5  . hydrochlorothiazide (HYDRODIURIL) 25 MG tablet Take 1 tablet (25 mg total) by mouth daily. 90 tablet 3  . HYDROcodone-acetaminophen (NORCO/VICODIN) 5-325 MG tablet Take 1 tablet by mouth every 6 (six) hours as needed for moderate pain. 30 tablet 0  . isosorbide mononitrate (IMDUR) 30 MG 24 hr tablet Take 1 tablet (30 mg total) by mouth daily. 30 tablet 5  . ketoconazole (NIZORAL) 2 % cream APPLY 1 APPLICATION TOPICALLY DAILY. APPLY TO AFFECTED AREAS. 15 g 1  . losartan (COZAAR) 100 MG tablet Take 1 tablet (100 mg total) by mouth daily. 90 tablet 3  . metoprolol tartrate (LOPRESSOR) 25 MG tablet Take 1 tablet (25 mg total) by mouth 2 (two) times daily. 60 tablet 5  . multivitamin-iron-minerals-folic acid (CENTRUM) chewable tablet Chew 1 tablet by mouth daily.    . nitroGLYCERIN (NITROSTAT) 0.4 MG SL tablet Place 1 tablet (0.4 mg total) under the tongue every 5 (five) minutes as needed for chest pain. [place]  tablet 0  . ofloxacin (FLOXIN OTIC) 0.3 % OTIC solution Place 10 drops into both ears daily. For 7 days. 5 mL 0  . pantoprazole (PROTONIX) 40 MG tablet Take 1 tablet (40 mg total) by mouth daily. 30 tablet 3  . triamcinolone (KENALOG) 0.025 % ointment Apply 1 application topically 2 (two) times daily. Apply to affected areas. 30 g 0  . vitamin C (ASCORBIC ACID) 500 MG tablet Take 500 mg by mouth daily.     No current facility-administered medications on file prior to visit.     LABS/IMAGING: No results found for this or any previous visit (from the past 48 hour(s)). No results found.  LIPID PANEL:    Component Value Date/Time   CHOL 189 04/15/2017 0234   TRIG 141 04/15/2017 0234   TRIG 70 07/18/2006 1301   HDL 62 04/15/2017 0234   CHOLHDL 3.0 04/15/2017 0234   VLDL 28 04/15/2017 0234   LDLCALC 99 04/15/2017 0234   LDLDIRECT 165 (H) 08/16/2013 1732    WEIGHTS: Wt Readings from Last 3 Encounters:  08/02/17 215 lb 6.4 oz (97.7 kg)  07/20/17 211 lb  (95.7 kg)  07/08/17 211 lb 6.4 oz (95.9 kg)    VITALS: BP 122/73   Pulse 95   Ht 5\' 7"  (1.702 m)   Wt 215 lb 6.4 oz (97.7 kg)   BMI 33.74 kg/m   EXAM: Deferred  EKG: Deferred  ASSESSMENT: 1. Coronary artery disease with an occluded RCA collateralized and a 90% LAD stenosis status post DES (04/2017) 2. Hypertensive heart disease 3. Hypertension 4. Dyslipidemia 5. Alcohol dependence - recently quit 6. Former smoker (now [employer])  PLAN: 1.   Mr. Hyser finally has better control over his blood pressure.  He is amazed that he is never seen blood pressure numbers like this.  He says he feels really pretty well although he has had some lightheadedness.  The seem to improve with eating.  He is encouraged to continue his current medications.  He is anginal free at this time.  We will plan to see him back in 6 months or sooner as necessary.  He will have the ordered blood work drawn today so we can follow-up on kidney function after starting the thiazide.  Follow-up in 6 months.  Pixie Casino, MD, [facility], [place] Director of the Advanced Lipid Disorders &  Cardiovascular Risk Reduction Clinic Diplomate of the American Board of Clinical Lipidology Attending Cardiologist  Direct Dial: [phone]  Fax: [phone]  Website:  [email or web address]  Nadean Corwin Stana Bayon 08/02/2017, 2:28 PM

## 2017-08-03 ENCOUNTER — Telehealth: Payer: Self-pay | Admitting: Internal Medicine

## 2017-08-03 DIAGNOSIS — K921 Melena: Secondary | ICD-10-CM

## 2017-08-03 DIAGNOSIS — Z79899 Other long term (current) drug therapy: Secondary | ICD-10-CM

## 2017-08-03 LAB — BASIC METABOLIC PANEL
BUN/Creatinine Ratio: 28 — ABNORMAL HIGH (ref 9–20)
BUN: 40 mg/dL — AB (ref 6–24)
CALCIUM: 9.3 mg/dL (ref 8.7–10.2)
CO2: 17 mmol/L — ABNORMAL LOW (ref 20–29)
CREATININE: 1.43 mg/dL — AB (ref 0.76–1.27)
Chloride: 103 mmol/L (ref 96–106)
GFR calc Af Amer: 62 mL/min/{1.73_m2} (ref >59)
GFR calc non Af Amer: 54 mL/min/{1.73_m2} — ABNORMAL LOW (ref >59)
GLUCOSE: 98 mg/dL (ref 65–99)
Potassium: 4 mmol/L (ref 3.5–5.2)
Sodium: 139 mmol/L (ref 134–144)

## 2017-08-03 NOTE — Telephone Encounter (Signed)
Notes recorded by Pixie Casino, MD on 08/03/2017 at 11:16 AM EST Creatinine is worse. Decrease HCTZ to 12.5 mg daily - encourage hydration. Repeat BMET in 1 week.  Dr. Lemmie Evens

## 2017-08-03 NOTE — Telephone Encounter (Signed)
LMTCB

## 2017-08-03 NOTE — Telephone Encounter (Signed)
New Message ° ° ° °Patient is returning call in reference to lab results. Please call.  °

## 2017-08-04 NOTE — Telephone Encounter (Signed)
Follow up ° ° ° °Patient calling for lab results °

## 2017-08-04 NOTE — Telephone Encounter (Signed)
Left message to call back  

## 2017-08-05 NOTE — Telephone Encounter (Signed)
Left message for pt to call.

## 2017-08-08 NOTE — Telephone Encounter (Signed)
LMTCB

## 2017-08-11 NOTE — Telephone Encounter (Signed)
Ok to order CBC as well. Thanks.  Dr. Lemmie Evens

## 2017-08-11 NOTE — Telephone Encounter (Signed)
Patient returned call. Notified him of lab results. Advised that he decrease hctz to 12.5mg  and come in to the office for lab work next week.   He also c/o feeling fatigued. He states his girlfriend suggested he get his iron level checked. Asked if he had any bleeding issues - he states he does have some blood in his stool but has h/o hemorrhoids   Will ask MD if he would like to order a CBC

## 2017-08-12 NOTE — Telephone Encounter (Signed)
Patient aware MD has ordered additional lab. He will come in Monday

## 2017-08-16 LAB — CBC
HEMATOCRIT: 33.8 % — AB (ref 37.5–51.0)
HEMOGLOBIN: 11.2 g/dL — AB (ref 13.0–17.7)
MCH: 32.3 pg (ref 26.6–33.0)
MCHC: 33.1 g/dL (ref 31.5–35.7)
MCV: 97 fL (ref 79–97)
RBC: 3.47 x10E6/uL — AB (ref 4.14–5.80)
RDW: 14.6 % (ref 12.3–15.4)
WBC: 4.2 10*3/uL (ref 3.4–10.8)

## 2017-08-16 LAB — BASIC METABOLIC PANEL
BUN/Creatinine Ratio: 34 — ABNORMAL HIGH (ref 9–20)
BUN: 66 mg/dL — AB (ref 6–24)
CO2: 18 mmol/L — ABNORMAL LOW (ref 20–29)
Calcium: 8.1 mg/dL — ABNORMAL LOW (ref 8.7–10.2)
Chloride: 96 mmol/L (ref 96–106)
Creatinine, Ser: 1.95 mg/dL — ABNORMAL HIGH (ref 0.76–1.27)
GFR calc non Af Amer: 37 mL/min/{1.73_m2} — ABNORMAL LOW (ref >59)
GFR, EST AFRICAN AMERICAN: 43 mL/min/{1.73_m2} — AB (ref >59)
GLUCOSE: 100 mg/dL — AB (ref 65–99)
Potassium: 3.3 mmol/L — ABNORMAL LOW (ref 3.5–5.2)
Sodium: 137 mmol/L (ref 134–144)

## 2017-08-19 ENCOUNTER — Telehealth: Payer: Self-pay | Admitting: Internal Medicine

## 2017-08-19 DIAGNOSIS — Z79899 Other long term (current) drug therapy: Secondary | ICD-10-CM

## 2017-08-19 NOTE — Telephone Encounter (Signed)
Med list updated and lab ordered per most recent BMET results

## 2017-08-23 MED FILL — ATORVASTATIN 80 MG TABLET: 80 | 30 days supply | Qty: 30 | Fill #1

## 2017-08-23 MED FILL — LOSARTAN POTASSIUM 100 MG T: 100 | 30 days supply | Qty: 30 | Fill #1

## 2017-08-23 MED FILL — ?CLOPIDOGREL 75MG TABLET: 75 | 30 days supply | Qty: 30 | Fill #1

## 2017-08-23 MED FILL — ?ISOSORBIDE MN 30 MG TAB SA: 30 | 30 days supply | Qty: 30 | Fill #1

## 2017-08-23 MED FILL — ?PANTOPRAZOLE SOD DR 40MG: 40 MG | 30 days supply | Qty: 30 | Fill #2

## 2017-08-23 MED FILL — ?METOPROLOL 25 MG TABLET: 25 | 30 days supply | Qty: 60 | Fill #1

## 2017-08-27 LAB — BASIC METABOLIC PANEL
BUN / CREAT RATIO: 19 (ref 9–20)
BUN: 20 mg/dL (ref 6–24)
CHLORIDE: 107 mmol/L — AB (ref 96–106)
CO2: 19 mmol/L — ABNORMAL LOW (ref 20–29)
Calcium: 9.8 mg/dL (ref 8.7–10.2)
Creatinine, Ser: 1.03 mg/dL (ref 0.76–1.27)
GFR calc non Af Amer: 80 mL/min/{1.73_m2} (ref >59)
GFR, EST AFRICAN AMERICAN: 92 mL/min/{1.73_m2} (ref >59)
GLUCOSE: 99 mg/dL (ref 65–99)
Potassium: 4.1 mmol/L (ref 3.5–5.2)
SODIUM: 146 mmol/L — AB (ref 134–144)

## 2017-08-31 ENCOUNTER — Telehealth: Payer: Self-pay | Admitting: Internal Medicine

## 2017-08-31 NOTE — Telephone Encounter (Signed)
Spoke with patient and he was concerned the hives and itching on his buttocks and legs were coming from increased dose of Losartan. Advised patient should not be coming from new dose and confirmed with Pharm D. Patient has appointment with PCP Friday

## 2017-08-31 NOTE — Telephone Encounter (Signed)
New message     Patient calling for lab results. Patient also has questions about rash, thinks it may be due to losartan (COZAAR) 100 MG tablet.  Pt c/o medication issue:  1. Name of Medication: losartan (COZAAR) 100 MG tablet  2. How are you currently taking this medication (dosage and times per day)? Take 1 tablet (100 mg total) by mouth daily.  3. Are you having a reaction (difficulty breathing--STAT)? No  4. What is your medication issue? rash

## 2017-09-02 ENCOUNTER — Encounter: Payer: Self-pay | Admitting: Family Medicine

## 2017-09-02 ENCOUNTER — Ambulatory Visit: Payer: Self-pay | Attending: Family Medicine | Admitting: Family Medicine

## 2017-09-02 VITALS — BP 116/61 | HR 89 | Temp 98.0°F | Ht 67.0 in | Wt 212.6 lb

## 2017-09-02 DIAGNOSIS — E785 Hyperlipidemia, unspecified: Secondary | ICD-10-CM | POA: Insufficient documentation

## 2017-09-02 DIAGNOSIS — L300 Nummular dermatitis: Secondary | ICD-10-CM | POA: Insufficient documentation

## 2017-09-02 DIAGNOSIS — F329 Major depressive disorder, single episode, unspecified: Secondary | ICD-10-CM | POA: Insufficient documentation

## 2017-09-02 DIAGNOSIS — Z7902 Long term (current) use of antithrombotics/antiplatelets: Secondary | ICD-10-CM | POA: Insufficient documentation

## 2017-09-02 DIAGNOSIS — Z87891 Personal history of nicotine dependence: Secondary | ICD-10-CM | POA: Insufficient documentation

## 2017-09-02 DIAGNOSIS — Z7982 Long term (current) use of aspirin: Secondary | ICD-10-CM | POA: Insufficient documentation

## 2017-09-02 DIAGNOSIS — Z79899 Other long term (current) drug therapy: Secondary | ICD-10-CM | POA: Insufficient documentation

## 2017-09-02 DIAGNOSIS — K219 Gastro-esophageal reflux disease without esophagitis: Secondary | ICD-10-CM | POA: Insufficient documentation

## 2017-09-02 DIAGNOSIS — M1990 Unspecified osteoarthritis, unspecified site: Secondary | ICD-10-CM | POA: Insufficient documentation

## 2017-09-02 DIAGNOSIS — F419 Anxiety disorder, unspecified: Secondary | ICD-10-CM | POA: Insufficient documentation

## 2017-09-02 DIAGNOSIS — Z955 Presence of coronary angioplasty implant and graft: Secondary | ICD-10-CM | POA: Insufficient documentation

## 2017-09-02 DIAGNOSIS — I10 Essential (primary) hypertension: Secondary | ICD-10-CM | POA: Insufficient documentation

## 2017-09-02 DIAGNOSIS — I2510 Atherosclerotic heart disease of native coronary artery without angina pectoris: Secondary | ICD-10-CM | POA: Insufficient documentation

## 2017-09-02 MED ORDER — TRIAMCINOLONE ACETONIDE 0.1 % EX CREA: 1.0000 "application " | TOPICAL_CREAM | Freq: Two times a day (BID) | CUTANEOUS | 1 refills | Status: DC

## 2017-09-02 MED ORDER — HYDROXYZINE HCL 25 MG PO TABS: 25.0000 mg | ORAL_TABLET | Freq: Three times a day (TID) | ORAL | 0 refills | Status: DC | PRN

## 2017-09-02 MED FILL — hydrOXYzine HCL 25 MG TABS: 25 | 20 days supply | Qty: 60 | Fill #0

## 2017-09-02 MED FILL — TRIAMCINOLONE ACETONIDE 0.1: 0.1 | 40 days supply | Qty: 80 | Fill #0

## 2017-09-02 NOTE — Patient Instructions (Signed)
Nummular Eczema Nummular eczema is a common disease that causes red, circular, crusted (plaque) lesions that may be itchy. It most commonly affects the lower legs and the backs of hands. Men tend to get their first outbreak between 55 and 59 years of age, and women tend to get their first outbreak during their teen or young adult years. What are the causes? The cause of this condition is not known. It may be related to certain skin sensitivities, such as sensitivity to:  Metals such as nickel and, rarely, mercury.  Formaldehyde.  Antibiotic medicine that is applied to the skin, such as neomycin.  What increases the risk? You are more likely to develop this condition if:  You have very dry skin.  You live in a place with dry and cold weather.  You have a personal or family history of eczema, asthma, or allergies.  You drink alcohol.  You have poor blood flow (circulation).  What are the signs or symptoms? Symptoms most commonly affect the lower legs, but may also affect the hands, torso, arms, or feet. Symptoms include:  Groups of tiny, red spots.  Blister-like sores that leak fluid. These sores may grow together and form circular patches. After a long time, they may become crusty and then scaly.  Well-defined patches of pink, red, or brown skin.  Itchiness and burning, ranging from mild to severe. Itchiness may be worse at night and may cause trouble sleeping. Scratching lesions can cause bleeding.  How is this diagnosed? This condition is diagnosed based on a physical exam and your medical history. Usually more tests are not needed, but you may need a swab test to check for skin infection. This test involves swabbing an affected area and testing the sample for bacteria (culture). You may work with a health care provider who specializes in the skin (dermatologist) to help diagnose and treat this condition. How is this treated? There is no cure for this condition, but treatment  can help relieve symptoms. Depending on how severe your symptoms are, your healthcare provider may suggest:  Medicine applied to the skin to reduce swelling and irritation (topical corticosteroids).  Medicine taken by mouth to reduce itching (oral antihistamines).  Antibiotic medicine to take by mouth (oral antibiotic) or to apply to your skin (topical antibiotic), if you have a skin infection.  Light therapy (phototherapy). This involves shining ultraviolet (UV) light on affected skin to reduce itchiness and inflammation.  Soaking in a bath that contains a type of salt that dries out blisters (potassium permanganate soaks).  Follow these instructions at home: Medicines  Take over-the-counter and prescription medicines only as told by your health care provider.  If you were prescribed an antibiotic, take or apply it as told by your health care provider. Do not stop using the antibiotic even if you start to feel better. Skin Care  Keep your fingernails short to avoid breaking open the skin when you scratch.  Wash your hands with mild soap and water to avoid infection.  Pat your skin dry after bathing or washing your hands. Avoid rubbing your skin.  Keep your skin hydrated. To do this: ? Avoid very hot water. Take lukewarm baths or showers. ? Apply moisturizer within three minutes of bathing. This locks in moisture. ? Use a humidifier when you have the heating or air conditioning on. This will add moisture to the air.  Identify and avoid things that trigger symptoms or irritate your skin. Triggers may include taking long, hot showers   or baths, or not using creams or ointments to moisturize. Certain soaps may also trigger this condition. General instructions  Dress in clothes made of cotton or cotton blends. Avoid wearing clothes with wool fabric.  Avoid activities that may cause skin injury. Wear protective clothing when doing outdoor activities such as gardening or hiking. Cuts,  scrapes, and insect bites can make symptoms worse.  Keep all follow-up visits as told by your health care provider. This is important. Contact a health care provider if:  You develop a yellowish crust on an area of affected skin.  You have symptoms that do not go away with treatment or home care methods. Get help right away if:  You have more redness, pain, pus, or swelling. Summary  Nummular eczema is a common disease that causes red, circular, crusted (plaque) lesions that may be itchy.  The cause of this condition is not known. It may be related to certain skin sensitivities.  Treatments may include medicines to reduce swelling and irritation, avoiding triggers, and keeping your skin hydrated. This information is not intended to replace advice given to you by your health care provider. Make sure you discuss any questions you have with your health care provider. Document Released: 11/18/2016 Document Revised: 11/18/2016 Document Reviewed: 11/18/2016 Elsevier Interactive Patient Education  2018 Elsevier Inc.  

## 2017-09-02 NOTE — Progress Notes (Signed)
Patient has bumps on legs,abdomen and back.

## 2017-09-02 NOTE — Progress Notes (Signed)
Subjective:  Patient ID: Richard Calhoun, male    DOB: 03/31/1961  Age: 57 y.o. MRN: [id number]  CC: Rash   HPI Richard Calhoun is a 57 year old male with a history of hypertension, hyperlipidemia, previous tobacco abuse, CAD status post DES in 04/2017 who presents today for an acute visit complaining of a pruritic rash for the last 2 weeks which started on his right butt cheek but has now spread to his lower abdomen and both legs but is absent on his upper extremities and face. He denies allergies to foods or creams and is only allergic to penicillin.  He commenced a new type of Dove soap 1 month ago but other than that he has no recollection of any new products he introduced. Denies fever and his girlfriend who lives with him does not have any similar rash.  Past Medical History:  Diagnosis Date  . ALLERGIC RHINITIS   . ANXIETY   . BICEPS TENDON RUPTURE, RIGHT   . DEPENDENCE, ALCOHOL NEC/NOS, UNSPECIFIED   . DEPRESSION   . GERD   . HYPERLIPIDEMIA   . HYPERTENSION   . Impaired glucose tolerance   . OSTEOARTHRITIS   . Tubular adenoma of colon 06/2006    Past Surgical History:  Procedure Laterality Date  . CORONARY ATHERECTOMY N/A 04/20/2017   Procedure: CORONARY ATHERECTOMY;  Surgeon: Wellington Hampshire, MD;  Location: [facility];  Service: Cardiovascular;  Laterality: N/A;  . CORONARY STENT INTERVENTION N/A 04/20/2017   Procedure: CORONARY STENT INTERVENTION;  Surgeon: Wellington Hampshire, MD;  Location: [facility];  Service: Cardiovascular;  Laterality: N/A;  . FOOT SURGERY    . INTRAVASCULAR PRESSURE WIRE/FFR STUDY N/A 04/15/2017   Procedure: INTRAVASCULAR PRESSURE WIRE/FFR STUDY;  Surgeon: Belva Crome, MD;  Location: [facility];  Service: Cardiovascular;  Laterality: N/A;  . LEFT HEART CATH AND CORONARY ANGIOGRAPHY N/A 04/15/2017   Procedure: LEFT HEART CATH AND CORONARY ANGIOGRAPHY;  Surgeon: Belva Crome, MD;  Location: [facility];  Service:  Cardiovascular;  Laterality: N/A;  . MANDIBLE FRACTURE SURGERY      Allergies  Allergen Reactions  . Penicillins Other (See Comments)    Childhood reaction Has patient had a PCN reaction causing immediate rash, facial/tongue/throat swelling, SOB or lightheadedness with hypotension: Unknown Has patient had a PCN reaction causing severe rash involving mucus membranes or skin necrosis: Unknown Has patient had a PCN reaction that required hospitalization: No Has patient had a PCN reaction occurring within the last 10 years: No If all of the above answers are "NO", then may proceed with Cephalosporin use.      Outpatient Medications Prior to Visit  Medication Sig Dispense Refill  . atorvastatin (LIPITOR) 80 MG tablet Take 1 tablet (80 mg total) by mouth daily at 6 PM. 30 tablet 5  . clopidogrel (PLAVIX) 75 MG tablet Take 1 tablet (75 mg total) by mouth daily. 30 tablet 5  . isosorbide mononitrate (IMDUR) 30 MG 24 hr tablet Take 1 tablet (30 mg total) by mouth daily. 30 tablet 5  . losartan (COZAAR) 100 MG tablet Take 1 tablet (100 mg total) by mouth daily. 90 tablet 3  . metoprolol tartrate (LOPRESSOR) 25 MG tablet Take 1 tablet (25 mg total) by mouth 2 (two) times daily. 60 tablet 5  . nitroGLYCERIN (NITROSTAT) 0.4 MG SL tablet Place 1 tablet (0.4 mg total) under the tongue every 5 (five) minutes as needed for chest pain. 30 tablet 0  .  pantoprazole (PROTONIX) 40 MG tablet Take 1 tablet (40 mg total) by mouth daily. 30 tablet 3  . aspirin 81 MG EC tablet Take 1 tablet (81 mg total) by mouth daily. (Patient not taking: Reported on 09/02/2017) 30 tablet 11  . HYDROcodone-acetaminophen (NORCO/VICODIN) 5-325 MG tablet Take 1 tablet by mouth every 6 (six) hours as needed for moderate pain. (Patient not taking: Reported on 09/02/2017) 30 tablet 0  . ketoconazole (NIZORAL) 2 % cream APPLY 1 APPLICATION TOPICALLY DAILY. APPLY TO AFFECTED AREAS. (Patient not taking: Reported on 09/02/2017) 15 g 1  .  multivitamin-iron-minerals-folic acid (CENTRUM) chewable tablet Chew 1 tablet by mouth daily.    Marland Kitchen ofloxacin (FLOXIN OTIC) 0.3 % OTIC solution Place 10 drops into both ears daily. For 7 days. (Patient not taking: Reported on 09/02/2017) 5 mL 0  . vitamin C (ASCORBIC ACID) 500 MG tablet Take 500 mg by mouth daily.    Marland Kitchen triamcinolone (KENALOG) 0.025 % ointment Apply 1 application topically 2 (two) times daily. Apply to affected areas. (Patient not taking: Reported on 09/02/2017) 30 g 0   No facility-administered medications prior to visit.     ROS Review of Systems  Constitutional: Negative for activity change and appetite change.  HENT: Negative for sinus pressure and sore throat.   Eyes: Negative for visual disturbance.  Respiratory: Negative for cough, chest tightness and shortness of breath.   Cardiovascular: Negative for chest pain and leg swelling.  Gastrointestinal: Negative for abdominal distention, abdominal pain, constipation and diarrhea.  Endocrine: Negative.   Genitourinary: Negative for dysuria.  Musculoskeletal: Negative for joint swelling and myalgias.  Skin: Positive for rash.  Allergic/Immunologic: Negative.   Neurological: Negative for weakness, light-headedness and numbness.  Psychiatric/Behavioral: Negative for dysphoric mood and suicidal ideas.    Objective:  BP 116/61   Pulse 89   Temp 98 F (36.7 C) (Oral)   Ht 5\' 7"  (1.702 m)   Wt 212 lb 9.6 oz (96.4 kg)   SpO2 95%   BMI 33.30 kg/m   BP/Weight 09/02/2017 12/18/930 09/21/5730  Systolic BP 202 542 706  Diastolic BP 61 73 237  Wt. (Lbs) 212.6 215.4 211  BMI 33.3 33.74 33.05      Physical Exam  Constitutional: He is oriented to person, place, and time. He appears well-developed and well-nourished.  Cardiovascular: Normal rate, normal heart sounds and intact distal pulses.  No murmur heard. Pulmonary/Chest: Effort normal and breath sounds normal. He has no wheezes. He has no rales. He exhibits no  tenderness.  Abdominal: Soft. Bowel sounds are normal. He exhibits no distension and no mass. There is no tenderness.  Musculoskeletal: Normal range of motion.  Neurological: He is alert and oriented to person, place, and time.  Skin:  Well-circumscribed coin-like plaques with scaly surface distributed on the gluteal region, lower abdomen, posterior legs bilaterally     Assessment & Plan:   1. Nummular eczema Discussed skin care with regards to eczema If symptoms persist at next visit will refer for skin biopsy. - triamcinolone cream (KENALOG) 0.1 %; Apply 1 application topically 2 (two) times daily.  Dispense: 80 g; Refill: 1 - hydrOXYzine (ATARAX/VISTARIL) 25 MG tablet; Take 1 tablet (25 mg total) by mouth 3 (three) times daily as needed.  Dispense: 60 tablet; Refill: 0   Meds ordered this encounter  Medications  . triamcinolone cream (KENALOG) 0.1 %    Sig: Apply 1 application topically 2 (two) times daily.    Dispense:  80 g  Refill:  1  . hydrOXYzine (ATARAX/VISTARIL) 25 MG tablet    Sig: Take 1 tablet (25 mg total) by mouth 3 (three) times daily as needed.    Dispense:  60 tablet    Refill:  0    Follow-up: Return in about 2 weeks (around 09/16/2017) for follow up on rash.   Charlott Rakes MD

## 2017-09-05 ENCOUNTER — Ambulatory Visit: Payer: No Typology Code available for payment source | Admitting: Family Medicine

## 2017-09-16 ENCOUNTER — Ambulatory Visit: Payer: No Typology Code available for payment source | Admitting: Family Medicine

## 2017-09-19 MED FILL — ISOSORBIDE MN ER 30 MG TAB: 30 | 30 days supply | Qty: 30 | Fill #2

## 2017-09-19 MED FILL — CLOPIDOGREL 75 MG TABLET: 75 | 30 days supply | Qty: 30 | Fill #2

## 2017-09-19 MED FILL — ?METOPROLOL 25 MG TABLET: 25 | 30 days supply | Qty: 60 | Fill #2

## 2017-09-19 MED FILL — ATORVASTATIN 80 MG TABLET: 80 | 30 days supply | Qty: 30 | Fill #2

## 2017-09-19 MED FILL — LOSARTAN POTASSIUM 100 MG T: 100 | 30 days supply | Qty: 30 | Fill #2

## 2017-09-20 ENCOUNTER — Ambulatory Visit: Payer: Self-pay | Admitting: Family Medicine

## 2017-09-20 ENCOUNTER — Other Ambulatory Visit: Payer: Self-pay

## 2017-09-20 DIAGNOSIS — Z760 Encounter for issue of repeat prescription: Secondary | ICD-10-CM

## 2017-09-20 MED ORDER — PANTOPRAZOLE SODIUM 40 MG PO TBEC: 40.0000 mg | DELAYED_RELEASE_TABLET | Freq: Every day | ORAL | 3 refills | Status: DC

## 2017-09-20 MED FILL — PANTOPRAZOLE SOD DR 40 MG T: 40 | 30 days supply | Qty: 30 | Fill #0

## 2017-09-21 ENCOUNTER — Ambulatory Visit: Payer: No Typology Code available for payment source | Admitting: Family Medicine

## 2017-09-23 ENCOUNTER — Ambulatory Visit: Payer: No Typology Code available for payment source | Admitting: Family Medicine

## 2017-09-26 ENCOUNTER — Other Ambulatory Visit: Payer: Self-pay

## 2017-09-26 ENCOUNTER — Telehealth: Payer: Self-pay | Admitting: Family Medicine

## 2017-09-26 DIAGNOSIS — L300 Nummular dermatitis: Secondary | ICD-10-CM

## 2017-09-26 NOTE — Telephone Encounter (Signed)
Pt called to request a refill on his medication -triamcinolone cream (KENALOG) 0.1 %  Please send to our pharmacy Please follow up

## 2017-09-26 NOTE — Telephone Encounter (Signed)
Patient was called and informed that he still has 1 refill left on his cream.

## 2017-09-27 MED ORDER — TRIAMCINOLONE ACETONIDE 0.1 % EX CREA: 1.0000 "application " | TOPICAL_CREAM | Freq: Two times a day (BID) | CUTANEOUS | 1 refills | Status: DC

## 2017-09-27 MED FILL — TRIAMCINOLONE 0.1% CREAM: 0.1 | 15 days supply | Qty: 36288 | Fill #1

## 2017-09-27 NOTE — Addendum Note (Signed)
Addended by: Charlott Rakes on: 09/27/2017 04:47 PM   Modules accepted: Orders

## 2017-09-27 NOTE — Telephone Encounter (Signed)
Done

## 2017-10-19 ENCOUNTER — Ambulatory Visit: Payer: No Typology Code available for payment source | Attending: Family Medicine | Admitting: Family Medicine

## 2017-10-19 ENCOUNTER — Encounter: Payer: Self-pay | Admitting: Family Medicine

## 2017-10-19 VITALS — BP 94/55 | HR 85 | Temp 97.9°F | Ht 67.0 in | Wt 198.8 lb

## 2017-10-19 DIAGNOSIS — K219 Gastro-esophageal reflux disease without esophagitis: Secondary | ICD-10-CM | POA: Insufficient documentation

## 2017-10-19 DIAGNOSIS — I10 Essential (primary) hypertension: Secondary | ICD-10-CM | POA: Insufficient documentation

## 2017-10-19 DIAGNOSIS — E785 Hyperlipidemia, unspecified: Secondary | ICD-10-CM | POA: Insufficient documentation

## 2017-10-19 DIAGNOSIS — I2510 Atherosclerotic heart disease of native coronary artery without angina pectoris: Secondary | ICD-10-CM | POA: Insufficient documentation

## 2017-10-19 DIAGNOSIS — R21 Rash and other nonspecific skin eruption: Secondary | ICD-10-CM | POA: Insufficient documentation

## 2017-10-19 DIAGNOSIS — Z7982 Long term (current) use of aspirin: Secondary | ICD-10-CM | POA: Insufficient documentation

## 2017-10-19 DIAGNOSIS — M1990 Unspecified osteoarthritis, unspecified site: Secondary | ICD-10-CM | POA: Insufficient documentation

## 2017-10-19 DIAGNOSIS — Z955 Presence of coronary angioplasty implant and graft: Secondary | ICD-10-CM | POA: Insufficient documentation

## 2017-10-19 DIAGNOSIS — Z880 Allergy status to penicillin: Secondary | ICD-10-CM | POA: Insufficient documentation

## 2017-10-19 DIAGNOSIS — F419 Anxiety disorder, unspecified: Secondary | ICD-10-CM | POA: Insufficient documentation

## 2017-10-19 DIAGNOSIS — L409 Psoriasis, unspecified: Secondary | ICD-10-CM | POA: Insufficient documentation

## 2017-10-19 DIAGNOSIS — F329 Major depressive disorder, single episode, unspecified: Secondary | ICD-10-CM | POA: Insufficient documentation

## 2017-10-19 DIAGNOSIS — F1020 Alcohol dependence, uncomplicated: Secondary | ICD-10-CM | POA: Insufficient documentation

## 2017-10-19 DIAGNOSIS — Z79899 Other long term (current) drug therapy: Secondary | ICD-10-CM | POA: Insufficient documentation

## 2017-10-19 DIAGNOSIS — Z87891 Personal history of nicotine dependence: Secondary | ICD-10-CM | POA: Insufficient documentation

## 2017-10-19 MED ORDER — TRIAMCINOLONE ACETONIDE 0.1 % EX CREA: 1.0000 "application " | TOPICAL_CREAM | Freq: Two times a day (BID) | CUTANEOUS | 1 refills | Status: DC

## 2017-10-19 MED FILL — TRIAMCINOLONE ACETONIDE 0.1: 0.1 | 30 days supply | Qty: 80 | Fill #0

## 2017-10-19 NOTE — Patient Instructions (Signed)
Psoriasis  Psoriasis is a long-term (chronic) skin condition. It causes raised, red patches (plaques) on your skin that look silvery. The red patches may show up anywhere on your body. They can be any size or shape.  Psoriasis can come and go. It can range from mild to very bad. It cannot be passed from one person to another (not contagious). There is no cure for this condition, but it can be helped with treatment.  Follow these instructions at home:  Skin Care   Apply moisturizers to your skin as needed. Only use those that your doctor has said are okay.   Apply cool compresses to the affected areas.   Do not scratch your skin.  Lifestyle     Do not use tobacco products. This includes cigarettes, chewing tobacco, and e-cigarettes. If you need help quitting, ask your doctor.   Drink little or no alcohol.   Try to lower your stress. Meditation or yoga may help.   Get sun as told by your doctor. Do not get sunburned.   Think about joining a psoriasis support group.  Medicines   Take or use over-the-counter and prescription medicines only as told by your doctor.   If you were prescribed an antibiotic, take or use it as told by your doctor. Do not stop taking the antibiotic even if your condition starts to get better.  General instructions   Keep a journal. Use this to help track what triggers an outbreak. Try to avoid any triggers.   See a counselor or social worker if you feel very sad, upset, or hopeless about your condition and these feelings affect your work or relationships.   Keep all follow-up visits as told by your doctor. This is important.  Contact a doctor if:   Your pain gets worse.   You have more redness or warmth in the affected areas.   You have new pain or stiffness in your joints.   Your pain or stiffness in your joints gets worse.   Your nails start to break easily.   Your nails pull away from the nail bed easily.   You have a fever.   You feel very sad (depressed).  This  information is not intended to replace advice given to you by your health care provider. Make sure you discuss any questions you have with your health care provider.  Document Released: 08/12/2004 Document Revised: 12/11/2015 Document Reviewed: 11/20/2014  Elsevier Interactive Patient Education  2018 [employer]

## 2017-10-19 NOTE — Progress Notes (Signed)
Subjective:  Patient ID: Richard Calhoun, male    DOB: 1960/09/13  Age: 57 y.o. MRN: [id number]  CC: Rash   HPI LOC [name] is a 56 year old male with a history of hypertension, hyperlipidemia, previous tobacco abuse, CAD status post DES in 04/2017 who presents today for follow-up of rash which has been present for 2 months at his last office visit and was described as pruritic which started on his right butt cheek but had spread to his lower abdomen and both legs but was absent on his upper extremities and face. He was treated with triamcinolone cream and informs me today rash is absent in his buttocks but has now appeared on the dorsum of his left foot and right dorsum and is only pruritic when he just gets out of the shower.  He endorses compliance with triamcinolone. Denies allergies to soaps, creams or foods, denies fever.   Past Medical History:  Diagnosis Date  . ALLERGIC RHINITIS   . ANXIETY   . BICEPS TENDON RUPTURE, RIGHT   . DEPENDENCE, ALCOHOL NEC/NOS, UNSPECIFIED   . DEPRESSION   . GERD   . HYPERLIPIDEMIA   . HYPERTENSION   . Impaired glucose tolerance   . OSTEOARTHRITIS   . Tubular adenoma of colon 06/2006    Past Surgical History:  Procedure Laterality Date  . CORONARY ATHERECTOMY N/A 04/20/2017   Procedure: CORONARY ATHERECTOMY;  Surgeon: Wellington Hampshire, MD;  Location: [facility];  Service: Cardiovascular;  Laterality: N/A;  . CORONARY STENT INTERVENTION N/A 04/20/2017   Procedure: CORONARY STENT INTERVENTION;  Surgeon: Wellington Hampshire, MD;  Location: [facility];  Service: Cardiovascular;  Laterality: N/A;  . FOOT SURGERY    . INTRAVASCULAR PRESSURE WIRE/FFR STUDY N/A 04/15/2017   Procedure: INTRAVASCULAR PRESSURE WIRE/FFR STUDY;  Surgeon: Belva Crome, MD;  Location: [facility];  Service: Cardiovascular;  Laterality: N/A;  . LEFT HEART CATH AND CORONARY ANGIOGRAPHY N/A 04/15/2017   Procedure: LEFT HEART CATH AND CORONARY ANGIOGRAPHY;   Surgeon: Belva Crome, MD;  Location: [facility];  Service: Cardiovascular;  Laterality: N/A;  . MANDIBLE FRACTURE SURGERY      Breast Cancer-relatedfamily history is not on file.  Allergies  Allergen Reactions  . Penicillins Other (See Comments)    Childhood reaction Has patient had a PCN reaction causing immediate rash, facial/tongue/throat swelling, SOB or lightheadedness with hypotension: Unknown Has patient had a PCN reaction causing severe rash involving mucus membranes or skin necrosis: Unknown Has patient had a PCN reaction that required hospitalization: No Has patient had a PCN reaction occurring within the last 10 years: No If all of the above answers are "NO", then may proceed with Cephalosporin use.      Outpatient Medications Prior to Visit  Medication Sig Dispense Refill  . atorvastatin (LIPITOR) 80 MG tablet Take 1 tablet (80 mg total) by mouth daily at 6 PM. 30 tablet 5  . clopidogrel (PLAVIX) 75 MG tablet Take 1 tablet (75 mg total) by mouth daily. 30 tablet 5  . hydrOXYzine (ATARAX/VISTARIL) 25 MG tablet Take 1 tablet (25 mg total) by mouth 3 (three) times daily as needed. 60 tablet 0  . isosorbide mononitrate (IMDUR) 30 MG 24 hr tablet Take 1 tablet (30 mg total) by mouth daily. 30 tablet 5  . losartan (COZAAR) 100 MG tablet Take 1 tablet (100 mg total) by mouth daily. 90 tablet 3  . metoprolol tartrate (LOPRESSOR) 25 MG tablet Take 1  tablet (25 mg total) by mouth 2 (two) times daily. 60 tablet 5  . multivitamin-iron-minerals-folic acid (CENTRUM) chewable tablet Chew 1 tablet by mouth daily.    . nitroGLYCERIN (NITROSTAT) 0.4 MG SL tablet Place 1 tablet (0.4 mg total) under the tongue every 5 (five) minutes as needed for chest pain. 30 tablet 0  . pantoprazole (PROTONIX) 40 MG tablet Take 1 tablet (40 mg total) by mouth daily. 30 tablet 3  . vitamin C (ASCORBIC ACID) 500 MG tablet Take 500 mg by mouth daily.    Marland Kitchen triamcinolone cream (KENALOG) 0.1 % Apply 1  application topically 2 (two) times daily. 80 g 1  . aspirin 81 MG EC tablet Take 1 tablet (81 mg total) by mouth daily. (Patient not taking: Reported on 09/02/2017) 30 tablet 11  . HYDROcodone-acetaminophen (NORCO/VICODIN) 5-325 MG tablet Take 1 tablet by mouth every 6 (six) hours as needed for moderate pain. (Patient not taking: Reported on 09/02/2017) 30 tablet 0  . ketoconazole (NIZORAL) 2 % cream APPLY 1 APPLICATION TOPICALLY DAILY. APPLY TO AFFECTED AREAS. (Patient not taking: Reported on 09/02/2017) 15 g 1  . ofloxacin (FLOXIN OTIC) 0.3 % OTIC solution Place 10 drops into both ears daily. For 7 days. (Patient not taking: Reported on 09/02/2017) 5 mL 0   No facility-administered medications prior to visit.     ROS Review of Systems  Constitutional: Negative for activity change and appetite change.  HENT: Negative for sinus pressure and sore throat.   Eyes: Negative for visual disturbance.  Respiratory: Negative for cough, chest tightness and shortness of breath.   Cardiovascular: Negative for chest pain and leg swelling.  Gastrointestinal: Negative for abdominal distention, abdominal pain, constipation and diarrhea.  Endocrine: Negative.   Genitourinary: Negative for dysuria.  Musculoskeletal: Negative for joint swelling and myalgias.  Skin:       See hpi  Allergic/Immunologic: Negative.   Neurological: Negative for weakness, light-headedness and numbness.  Psychiatric/Behavioral: Negative for dysphoric mood and suicidal ideas.    Objective:  BP (!) 94/55   Pulse 85   Temp 97.9 F (36.6 C) (Oral)   Ht 5\' 7"  (1.702 m)   Wt 198 lb 12.8 oz (90.2 kg)   SpO2 96%   BMI 31.14 kg/m   BP/Weight 10/19/2017 09/02/2017 3/81/0175  Systolic BP 94 102 585  Diastolic BP 55 61 73  Wt. (Lbs) 198.8 212.6 215.4  BMI 31.14 33.3 33.74      Physical Exam  Constitutional: He is oriented to person, place, and time. He appears well-developed and well-nourished.  Cardiovascular: Normal rate,  normal heart sounds and intact distal pulses.  No murmur heard. Pulmonary/Chest: Effort normal and breath sounds normal. He has no wheezes. He has no rales. He exhibits no tenderness.  Abdominal: Soft. Bowel sounds are normal. He exhibits no distension and no mass. There is no tenderness.  Musculoskeletal: Normal range of motion.  Neurological: He is alert and oriented to person, place, and time. Coordination (tremors) abnormal.  Skin:  Erythematous plaques on dorsum of left foot, posterior right leg, dorsum of right hand and right flank Right ear with dry scaly erythematous rash which was absent at the last visit     Assessment & Plan:   1. Psoriasis Unontrolled - triamcinolone cream (KENALOG) 0.1 %; Apply 1 application topically 2 (two) times daily.  Dispense: 80 g; Refill: 1 - Ambulatory referral to Dermatology  2. S/P drug eluting coronary stent placement Factor modification Continue statin, Plavix Follow-up with cardiology  3.  Uncomplicated alcohol dependence ([place])  discussed dangers of ongoing alcohol abuse and the need to quit He is not interested at this time   Meds ordered this encounter  Medications  . triamcinolone cream (KENALOG) 0.1 %    Sig: Apply 1 application topically 2 (two) times daily.    Dispense:  80 g    Refill:  1    Follow-up: Return in about 3 months (around 01/18/2018) for Follow up of chronic medical conditions.   Charlott Rakes MD

## 2017-10-19 NOTE — Progress Notes (Signed)
Patient states that rash is not getting better.

## 2017-10-27 MED FILL — LOSARTAN POTASSIUM 100 MG T: 100 | 30 days supply | Qty: 30 | Fill #3

## 2017-10-27 MED FILL — PANTOPRAZOLE SOD DR 40 MG T: 40 | 30 days supply | Qty: 30 | Fill #1

## 2017-10-27 MED FILL — ISOSORBIDE MN ER 30 MG TAB: 30 | 30 days supply | Qty: 30 | Fill #3

## 2017-10-27 MED FILL — CLOPIDOGREL 75 MG TABLET: 75 | 30 days supply | Qty: 30 | Fill #3

## 2017-11-10 ENCOUNTER — Ambulatory Visit: Payer: Self-pay | Attending: Family Medicine | Admitting: Physician Assistant

## 2017-11-10 VITALS — BP 124/7 | HR 97 | Temp 99.2°F | Resp 16 | Ht 67.0 in | Wt 199.6 lb

## 2017-11-10 DIAGNOSIS — M1990 Unspecified osteoarthritis, unspecified site: Secondary | ICD-10-CM | POA: Insufficient documentation

## 2017-11-10 DIAGNOSIS — Z79899 Other long term (current) drug therapy: Secondary | ICD-10-CM | POA: Insufficient documentation

## 2017-11-10 DIAGNOSIS — Z880 Allergy status to penicillin: Secondary | ICD-10-CM | POA: Insufficient documentation

## 2017-11-10 DIAGNOSIS — L409 Psoriasis, unspecified: Secondary | ICD-10-CM | POA: Insufficient documentation

## 2017-11-10 DIAGNOSIS — L301 Dyshidrosis [pompholyx]: Secondary | ICD-10-CM | POA: Insufficient documentation

## 2017-11-10 DIAGNOSIS — M26622 Arthralgia of left temporomandibular joint: Secondary | ICD-10-CM | POA: Insufficient documentation

## 2017-11-10 DIAGNOSIS — B353 Tinea pedis: Secondary | ICD-10-CM | POA: Insufficient documentation

## 2017-11-10 DIAGNOSIS — K219 Gastro-esophageal reflux disease without esophagitis: Secondary | ICD-10-CM | POA: Insufficient documentation

## 2017-11-10 DIAGNOSIS — H9202 Otalgia, left ear: Secondary | ICD-10-CM | POA: Insufficient documentation

## 2017-11-10 DIAGNOSIS — F419 Anxiety disorder, unspecified: Secondary | ICD-10-CM | POA: Insufficient documentation

## 2017-11-10 DIAGNOSIS — E785 Hyperlipidemia, unspecified: Secondary | ICD-10-CM | POA: Insufficient documentation

## 2017-11-10 DIAGNOSIS — F329 Major depressive disorder, single episode, unspecified: Secondary | ICD-10-CM | POA: Insufficient documentation

## 2017-11-10 DIAGNOSIS — R6884 Jaw pain: Secondary | ICD-10-CM | POA: Insufficient documentation

## 2017-11-10 DIAGNOSIS — Z7982 Long term (current) use of aspirin: Secondary | ICD-10-CM | POA: Insufficient documentation

## 2017-11-10 DIAGNOSIS — I10 Essential (primary) hypertension: Secondary | ICD-10-CM | POA: Insufficient documentation

## 2017-11-10 MED ORDER — MELOXICAM 7.5 MG PO TABS: 7.5000 mg | ORAL_TABLET | Freq: Every day | ORAL | 0 refills | Status: DC

## 2017-11-10 MED ORDER — TRIAMCINOLONE ACETONIDE 0.1 % EX CREA: 1.0000 "application " | TOPICAL_CREAM | Freq: Two times a day (BID) | CUTANEOUS | 1 refills | Status: DC

## 2017-11-10 MED ORDER — CLOTRIMAZOLE-BETAMETHASONE 1-0.05 % EX CREA: 1.0000 "application " | TOPICAL_CREAM | Freq: Two times a day (BID) | CUTANEOUS | 3 refills | Status: DC

## 2017-11-10 MED FILL — MELOXICAM 7.5 MG TABLET: 7.5 | 30 days supply | Qty: 30 | Fill #0

## 2017-11-10 MED FILL — CLOTRIMAZOLE-BETAMETHASONE: 1-0.05 | 20 days supply | Qty: 45 | Fill #0

## 2017-11-10 MED FILL — TRIAMCINOLONE ACETONIDE 0.1: 0.1 | 20 days supply | Qty: 80 | Fill #0

## 2017-11-10 NOTE — Progress Notes (Signed)
Pt. Stated he have bumps on his body and rash on his body. Pt. Stated his left ear use to throb but today is much better.

## 2017-11-10 NOTE — Progress Notes (Signed)
Patient ID: Richard Calhoun, male   DOB: 09/20/1960, 57 y.o.   MRN: [id number]      Richard Calhoun, is a 57 y.o. male  DGU:[id number]  ZDG:[id number]  DOB - May 25, 1961  Subjective:  Chief Complaint and HPI: Richard Calhoun is a 57 y.o. male here today for several issues. Pain in L ear.  L Jaw pain.  Occurring just over the last several nights.  No f/c.  No CP.  Eczema on hands; needs cream.  Rash on B feet and at times in groin.  ROS:   Constitutional:  No f/c, No night sweats, No unexplained weight loss. EENT:  No vision changes, No blurry vision, No hearing changes. No other mouth, throat, or ear problems. + L jaw pain just in front of ear Respiratory: No cough, No SOB Cardiac: No CP, no palpitations GI:  No abd pain, No N/V/D. GU: No Urinary s/sx Musculoskeletal: No joint pain Neuro: No headache, no dizziness, no motor weakness.  Skin: +chronic Endocrine:  No polydipsia. No polyuria.  Psych: Denies SI/HI  No problems updated.  ALLERGIES: Allergies  Allergen Reactions  . Penicillins Other (See Comments)    Childhood reaction Has patient had a PCN reaction causing immediate rash, facial/tongue/throat swelling, SOB or lightheadedness with hypotension: Unknown Has patient had a PCN reaction causing severe rash involving mucus membranes or skin necrosis: Unknown Has patient had a PCN reaction that required hospitalization: No Has patient had a PCN reaction occurring within the last 10 years: No If all of the above answers are "NO", then may proceed with Cephalosporin use.     PAST MEDICAL HISTORY: Past Medical History:  Diagnosis Date  . ALLERGIC RHINITIS   . ANXIETY   . BICEPS TENDON RUPTURE, RIGHT   . DEPENDENCE, ALCOHOL NEC/NOS, UNSPECIFIED   . DEPRESSION   . GERD   . HYPERLIPIDEMIA   . HYPERTENSION   . Impaired glucose tolerance   . OSTEOARTHRITIS   . Tubular adenoma of colon 06/2006    MEDICATIONS AT HOME: Prior to Admission medications   Medication Sig Start  Date End Date Taking? Authorizing Provider  atorvastatin (LIPITOR) 80 MG tablet Take 1 tablet (80 mg total) by mouth daily at 6 PM. 07/08/17  Yes Hairston, Mandesia R, FNP  clopidogrel (PLAVIX) 75 MG tablet Take 1 tablet (75 mg total) by mouth daily. 07/08/17  Yes Hairston, Toy Baker R, FNP  hydrOXYzine (ATARAX/VISTARIL) 25 MG tablet Take 1 tablet (25 mg total) by mouth 3 (three) times daily as needed. 09/02/17  Yes Charlott Rakes, MD  isosorbide mononitrate (IMDUR) 30 MG 24 hr tablet Take 1 tablet (30 mg total) by mouth daily. 07/08/17  Yes Hairston, Mandesia R, FNP  losartan (COZAAR) 100 MG tablet Take 1 tablet (100 mg total) by mouth daily. 07/20/17  Yes Hilty, Nadean Corwin, MD  metoprolol tartrate (LOPRESSOR) 25 MG tablet Take 1 tablet (25 mg total) by mouth 2 (two) times daily. 07/08/17  Yes Hairston, Maylon Peppers, FNP  pantoprazole (PROTONIX) 40 MG tablet Take 1 tablet (40 mg total) by mouth daily. 09/20/17  Yes Charlott Rakes, MD  triamcinolone cream (KENALOG) 0.1 % Apply 1 application topically 2 (two) times daily. 11/10/17  Yes Argentina Donovan, PA-C  aspirin 81 MG EC tablet Take 1 tablet (81 mg total) by mouth daily. Patient not taking: Reported on 09/02/2017 07/08/17   Alfonse Spruce, FNP  clotrimazole-betamethasone (LOTRISONE) cream Apply 1 application topically 2 (two) times daily. X 3 weeks then prn 11/10/17   McClung,  Dionne Bucy, PA-C  HYDROcodone-acetaminophen (NORCO/VICODIN) 5-325 MG tablet Take 1 tablet by mouth every 6 (six) hours as needed for moderate pain. Patient not taking: Reported on 09/02/2017 04/21/17   Georgette Shell, MD  meloxicam (MOBIC) 7.5 MG tablet Take 1 tablet (7.5 mg total) by mouth daily. 11/10/17   Argentina Donovan, PA-C  multivitamin-iron-minerals-folic acid (CENTRUM) chewable tablet Chew 1 tablet by mouth daily.    [provider]  nitroGLYCERIN (NITROSTAT) 0.4 MG SL tablet Place 1 tablet (0.4 mg total) under the tongue every 5 (five) minutes as needed  for chest pain. Patient not taking: Reported on 11/10/2017 04/21/17   Georgette Shell, MD  ofloxacin (FLOXIN OTIC) 0.3 % OTIC solution Place 10 drops into both ears daily. For 7 days. Patient not taking: Reported on 09/02/2017 07/08/17   Alfonse Spruce, FNP  vitamin C (ASCORBIC ACID) 500 MG tablet Take 500 mg by mouth daily.    [provider]     Objective:  EXAM:   Vitals:   11/10/17 1351  BP: (!) 124/7  Pulse: 97  Resp: 16  Temp: 99.2 F (37.3 C)  TempSrc: Oral  SpO2: 98%  Weight: 199 lb 9.6 oz (90.5 kg)  Height: 5\' 7"  (1.702 m)    General appearance : A&OX3. NAD. Non-toxic-appearing HEENT: Atraumatic and Normocephalic.  PERRLA. EOM intact.  TM clear B.  +TTP over L TMJ.  No dislocation/popping/clicking.   Mouth-MMM, post pharynx WNL w/o erythema, No PND. Neck: supple, no JVD. No cervical lymphadenopathy. No thyromegaly Chest/Lungs:  Breathing-non-labored, Good air entry bilaterally, breath sounds normal without rales, rhonchi, or wheezing  CVS: S1 S2 regular, no murmurs, gallops, rubs  Extremities: Bilateral Lower Ext shows no edema, both legs are warm to touch with = pulse throughout Neurology:  CN II-XII grossly intact, Non focal.   Psych:  TP linear. J/I WNL. Normal speech. Appropriate eye contact and affect.  Skin:  Dyshidrotic eczema B hands.  B feet with beefy erythema of soles and some cracking of the skin with satellite lesions.    Data Review Lab Results  Component Value Date   HGBA1C 5.9 (H) 04/15/2017   HGBA1C 5.6 12/23/2016   HGBA1C 6.0 08/29/2012     Assessment & Plan   1. Psoriasis - triamcinolone cream (KENALOG) 0.1 %; Apply 1 application topically 2 (two) times daily.  Dispense: 80 g; Refill: 1  2. Dyshidrotic eczema - triamcinolone cream (KENALOG) 0.1 %; Apply 1 application topically 2 (two) times daily.  Dispense: 80 g; Refill: 1  3. Tinea pedis of both feet - clotrimazole-betamethasone (LOTRISONE) cream; Apply 1 application  topically 2 (two) times daily. X 3 weeks then prn  Dispense: 45 g; Refill: 3  4. Arthralgia of left temporomandibular joint - meloxicam (MOBIC) 7.5 MG tablet; Take 1 tablet (7.5 mg total) by mouth daily.  Dispense: 30 tablet; Refill: 0   Patient have been counseled extensively about nutrition and exercise  Return in about 3 months (around 02/09/2018) for Dr Margarita Rana; htn and skin issues.  The patient was given clear instructions to go to ER or return to medical center if symptoms don't improve, worsen or new problems develop. The patient verbalized understanding. The patient was told to call to get lab results if they haven't heard anything in the next week.     Freeman Caldron, PA-C [facility] [place], [place]   11/10/2017, 2:08 PM

## 2017-11-10 NOTE — Patient Instructions (Signed)
Dyshidrotic Eczema Dyshidrotic eczema (pompholyx) is a type of eczema that causes very itchy (pruritic), fluid-filled blisters (vesicles) to form on the hands and feet. It can affect people of any age, but is more common before the age of 40. There is no cure, but treatment and certain lifestyle changes can help relieve symptoms. What are the causes? The cause of this condition is not known. What increases the risk? You are more likely to develop this condition if:  You wash your hands frequently.  You have a personal history or family history of eczema, allergies, asthma, or hay fever.  You are allergic to metals such as nickel or cobalt.  You work with cement.  You smoke.  What are the signs or symptoms? Symptoms of this condition may affect the hands, feet, or both. Symptoms may come and go (recur), and may include:  Severe itching, which may happen before blisters appear.  Blisters. These may form suddenly. ? In the early stages, blisters may form near the fingertips. ? In severe cases, blisters may grow to large blister masses (bullae). ? Blisters resolve in 2-3 weeks without bursting. This is followed by a dry phase in which itching eases.  Pain and swelling.  Cracks or long, narrow openings (fissures) in the skin.  Severe dryness.  Ridges on the nails.  How is this diagnosed? This condition may be diagnosed based on:  A physical exam.  Your symptoms.  Your medical history.  Skin scrapings to rule out a fungal infection.  Testing a swab of fluid for bacteria (culture).  Removing and checking a small piece of skin (biopsy) in order to test for infection or to rule out other conditions.  Skin patch tests. These tests involve taking patches that contain possible allergens and placing them on your back. Your health care provider will wait a few days and then check to see if an allergic reaction occurred. These tests may be done if your health care provider suspects  allergic reactions, or to rule out other types of eczema.  You may be referred to a health care provider who specializes in the skin (dermatologist) to help diagnose and treat this condition. How is this treated? There is no cure for this condition, but treatment can help relieve symptoms. Depending on how many blisters you have and how severe they are, your health care provider may suggest:  Avoiding allergens, irritants, or triggers that worsen symptoms. This may involve lifestyle changes such as: ? Using different lotions or soaps. ? Avoiding hot weather or places that will cause you to sweat a lot. ? Managing stress with coping techniques such as relaxation and exercise, and asking for help when you need it. ? Diet changes as recommended by your health care provider.  Using a clean, damp towel (cool compress) to relieve symptoms.  Soaking in a bath that contains a type of salt that relieves irritation (aluminum acetate soaks).  Medicine taken by mouth to reduce itching (oral antihistamines).  Medicine applied to the skin to reduce swelling and irritation (topical corticosteroids).  Medicine that reduces the activity of the body's disease-fighting system (immunosuppressants) to treat inflammation. This may be given in severe cases.  Antibiotic medicines to treat bacterial infection.  Light therapy (phototherapy). This involves shining ultraviolet (UV) light on affected skin in order to reduce itchiness and inflammation.  Follow these instructions at home: Bathing and skin care  Wash skin gently. After bathing or washing your hands, pat your skin dry. Avoid rubbing your   skin.  Remove all jewelry before bathing. If the skin under the jewelry stays wet, blisters may form or get worse.  Apply cool compresses as told by your health care provider: ? Soak a clean towel in cool water. ? Wring out excess water until towel is damp. ? Place the towel over affected skin. Leave the towel on  for 20 minutes at a time, 2-3 times a day.  Use mild soaps, cleansers, and lotions that do not contain dyes, perfumes, or other irritants.  Keep your skin hydrated. To do this: ? Avoid very hot water. Take lukewarm baths or showers. ? Apply moisturizer within three minutes of bathing. This locks in moisture. Medicines  Take and apply over-the-counter and prescription medicines only as told by your health care provider.  If you were prescribed antibiotic medicine, take or apply it as told by your health care provider. Do not stop using the antibiotic even if you start to feel better. General instructions  Identify and avoid triggers and allergens.  Keep fingernails short to avoid breaking open the skin while scratching.  Use waterproof gloves to protect your hands when doing work that keeps your hands wet for a long time.  Wear socks to keep your feet dry.  Do not use any products that contain nicotine or tobacco, such as cigarettes and e-cigarettes. If you need help quitting, ask your health care provider.  Keep all follow-up visits as told by your health care provider. This is important. Contact a health care provider if:  You have symptoms that do not go away.  You have signs of infection, such as: ? Crusting, pus, or a bad smell. ? More redness, swelling, or pain. ? Increased warmth in the affected area. Summary  Dyshidrotic eczema (pompholyx) is a type of eczema that causes very itchy (pruritic), fluid-filled blisters (vesicles) to form on the hands and feet.  The cause of this condition is not known.  There is no cure for this condition, but treatment can help relieve symptoms. Treatment depends on how many blisters you have and how severe they are.  Use mild soaps, cleansers, and lotions that do not contain dyes, perfumes, or other irritants. Keep your skin hydrated. This information is not intended to replace advice given to you by your health care provider. Make sure  you discuss any questions you have with your health care provider. Document Released: 11/18/2016 Document Revised: 11/18/2016 Document Reviewed: 11/18/2016 Elsevier Interactive Patient Education  2018 [employer] Athlete's foot (tinea pedis) is a fungal infection of the skin on the feet. It often occurs on the skin that is between or underneath the toes. It can also occur on the soles of the feet. The infection can spread from person to person (is contagious). Follow these instructions at home:  Apply or take over-the-counter and prescription medicines only as told by your doctor.  Keep all follow-up visits as told by your doctor. This is important.  Do not scratch your feet.  Keep your feet dry: ? Wear cotton or wool socks. Change your socks every day or if they become wet. ? Wear shoes that allow air to move around, such as sandals or canvas tennis shoes.  Wash and dry your feet: ? Every day or as told by your doctor. ? After exercising. ? Including the area between your toes.  Wear sandals in wet areas, such as locker rooms and shared showers.  Do not share any of these items: ? Towels. ? Nail  clippers. ? Other personal items that touch your feet.  If you have diabetes, keep your blood sugar under control. Contact a doctor if:  You have a fever.  You have swelling, soreness, warmth, or redness in your foot.  You are not getting better with treatment.  Your symptoms get worse.  You have new symptoms. This information is not intended to replace advice given to you by your health care provider. Make sure you discuss any questions you have with your health care provider. Document Released: 12/22/2007 Document Revised: 12/11/2015 Document Reviewed: 01/06/2015 Elsevier Interactive Patient Education  2018 [employer] Temporomandibular Joint Syndrome Temporomandibular joint (TMJ) syndrome is a condition that affects the joints between your jaw and your  skull. The TMJs are located near your ears and allow your jaw to open and close. These joints and the nearby muscles are involved in all movements of the jaw. People with TMJ syndrome have pain in the area of these joints and muscles. Chewing, biting, or other movements of the jaw can be difficult or painful. TMJ syndrome can be caused by various things. In many cases, the condition is mild and goes away within a few weeks. For some people, the condition can become a long-term problem. What are the causes? Possible causes of TMJ syndrome include:  Grinding your teeth or clenching your jaw. Some people do this when they are under stress.  Arthritis.  Injury to the jaw.  Head or neck injury.  Teeth or dentures that are not aligned well.  In some cases, the cause of TMJ syndrome may not be known. What are the signs or symptoms? The most common symptom is an aching pain on the side of the head in the area of the TMJ. Other symptoms may include:  Pain when moving your jaw, such as when chewing or biting.  Being unable to open your jaw all the way.  Making a clicking sound when you open your mouth.  Headache.  Earache.  Neck or shoulder pain.  How is this diagnosed? Diagnosis can usually be made based on your symptoms, your medical history, and a physical exam. Your health care provider may check the range of motion of your jaw. Imaging tests, such as X-rays or an MRI, are sometimes done. You may need to see your dentist to determine if your teeth and jaw are lined up correctly. How is this treated? TMJ syndrome often goes away on its own. If treatment is needed, the options may include:  Eating soft foods and applying ice or heat.  Medicines to relieve pain or inflammation.  Medicines to relax the muscles.  A splint, bite plate, or mouthpiece to prevent teeth grinding or jaw clenching.  Relaxation techniques or counseling to help reduce stress.  Transcutaneous electrical  nerve stimulation (TENS). This helps to relieve pain by applying an electrical current through the skin.  Acupuncture. This is sometimes helpful to relieve pain.  Jaw surgery. This is rarely needed.  Follow these instructions at home:  Take medicines only as directed by your health care provider.  Eat a soft diet if you are having trouble chewing.  Apply ice to the painful area. ? Put ice in a plastic bag. ? Place a towel between your skin and the bag. ? Leave the ice on for 20 minutes, 2-3 times a day.  Apply a warm compress to the painful area as directed.  Massage your jaw area and perform any jaw stretching exercises as recommended by your health  care provider.  If you were given a mouthpiece or bite plate, wear it as directed.  Avoid foods that require a lot of chewing. Do not chew gum.  Keep all follow-up visits as directed by your health care provider. This is important. Contact a health care provider if:  You are having trouble eating.  You have new or worsening symptoms. Get help right away if:  Your jaw locks open or closed. This information is not intended to replace advice given to you by your health care provider. Make sure you discuss any questions you have with your health care provider. Document Released: 03/30/2001 Document Revised: 03/04/2016 Document Reviewed: 02/07/2014 Elsevier Interactive Patient Education  [employer].

## 2017-11-29 MED FILL — TRIAMCINOLONE ACETONIDE 0.1: 0.1 | 20 days supply | Qty: 80 | Fill #1

## 2017-11-29 MED FILL — CLOPIDOGREL 75 MG TABLET: 75 | 30 days supply | Qty: 30 | Fill #4

## 2017-11-29 MED FILL — ISOSORBIDE MN ER 30 MG TAB: 30 | 30 days supply | Qty: 30 | Fill #4

## 2017-11-29 MED FILL — ?METOPROLOL TARTRATE 25 MG: 25 | 30 days supply | Qty: 60 | Fill #3

## 2017-11-29 MED FILL — ATORVASTATIN 80 MG TABLET: 80 | 30 days supply | Qty: 30 | Fill #3

## 2017-11-29 MED FILL — PANTOPRAZOLE SOD DR 40 MG T: 40 | 30 days supply | Qty: 30 | Fill #2

## 2017-11-29 MED FILL — LOSARTAN POTASSIUM 100 MG T: 100 | 30 days supply | Qty: 30 | Fill #4

## 2017-12-20 MED FILL — TRIAMCINOLONE ACETONIDE 0.1: 0.1 | 30 days supply | Qty: 80 | Fill #1

## 2017-12-28 MED FILL — CLOPIDOGREL 75 MG TABLET: 75 | 30 days supply | Qty: 30 | Fill #5

## 2017-12-28 MED FILL — ?METOPROLOL TARTRATE 25 MG: 25 | 30 days supply | Qty: 60 | Fill #4

## 2017-12-28 MED FILL — ISOSORBIDE MN ER 30 MG TAB: 30 | 30 days supply | Qty: 30 | Fill #5

## 2017-12-28 MED FILL — CLOTRIMAZOLE-BETAMETHASONE: 1-0.05 | 20 days supply | Qty: 45 | Fill #1

## 2017-12-28 MED FILL — LOSARTAN POTASSIUM 100 MG T: 100 | 30 days supply | Qty: 30 | Fill #5

## 2017-12-28 MED FILL — PANTOPRAZOLE SOD DR 40 MG T: 40 | 30 days supply | Qty: 30 | Fill #3

## 2018-01-11 MED FILL — TRIAMCINOLONE ACETONIDE 0.1: 0.1 | 20 days supply | Qty: 80 | Fill #0

## 2018-01-11 MED FILL — ATORVASTATIN 80 MG TABLET: 80 | 30 days supply | Qty: 30 | Fill #4

## 2018-01-27 ENCOUNTER — Other Ambulatory Visit: Payer: Self-pay | Admitting: Family Medicine

## 2018-01-27 DIAGNOSIS — Z760 Encounter for issue of repeat prescription: Secondary | ICD-10-CM

## 2018-01-27 MED FILL — LOSARTAN POTASSIUM 100 MG T: 100 | 30 days supply | Qty: 30 | Fill #6

## 2018-01-27 MED FILL — ISOSORBIDE MN ER 30 MG TAB: 30 | 30 days supply | Qty: 30 | Fill #2

## 2018-01-27 MED FILL — TRIAMCINOLONE 0.1% CREAM: 0.1 | 20 days supply | Qty: 80 | Fill #1

## 2018-01-27 MED FILL — CLOPIDOGREL 75 MG TABLET: 75 | 30 days supply | Qty: 30 | Fill #2

## 2018-01-27 MED FILL — PANTOPRAZOLE SOD DR 40 MG T: 40 | 30 days supply | Qty: 30 | Fill #0

## 2018-02-10 MED FILL — ATORVASTATIN 80 MG TABLET: 80 | 30 days supply | Qty: 30 | Fill #5

## 2018-02-10 MED FILL — METOPROLOL TARTRATE 25 MG T: 25 | 30 days supply | Qty: 60 | Fill #5

## 2018-02-13 ENCOUNTER — Ambulatory Visit: Payer: No Typology Code available for payment source | Admitting: Family Medicine

## 2018-02-14 ENCOUNTER — Ambulatory Visit: Payer: Self-pay | Attending: Family Medicine

## 2018-02-22 ENCOUNTER — Other Ambulatory Visit: Payer: Self-pay | Admitting: Family Medicine

## 2018-02-22 DIAGNOSIS — L300 Nummular dermatitis: Secondary | ICD-10-CM

## 2018-02-22 MED FILL — TRIAMCINOLONE ACETONIDE 0.1: 0.1 | 20 days supply | Qty: 80 | Fill #0

## 2018-02-28 MED FILL — ISOSORBIDE MN ER 30 MG TAB: 30 | 30 days supply | Qty: 30 | Fill #3

## 2018-02-28 MED FILL — LOSARTAN POTASSIUM 100 MG T: 100 | 30 days supply | Qty: 30 | Fill #7

## 2018-02-28 MED FILL — ?CLOPIDOGREL 75MG TA: 75 | 30 days supply | Qty: 30 | Fill #3

## 2018-02-28 MED FILL — ?PANTOPRAZOLE SO DR 40MG TA: 40 | 30 days supply | Qty: 30 | Fill #1

## 2018-03-14 ENCOUNTER — Telehealth: Payer: Self-pay | Admitting: Family Medicine

## 2018-03-14 DIAGNOSIS — L409 Psoriasis, unspecified: Secondary | ICD-10-CM

## 2018-03-14 NOTE — Telephone Encounter (Signed)
Will route to PCP for review. 

## 2018-03-14 NOTE — Telephone Encounter (Signed)
Patient called requesting referral be put in for dermatology, stated that he was going but then his OC expired and now that it has been renewed he needs appointment at the dermatologist again. Please follow up with patient.

## 2018-03-16 NOTE — Telephone Encounter (Signed)
Referral has been placed. 

## 2018-03-21 ENCOUNTER — Telehealth: Payer: Self-pay | Admitting: Family Medicine

## 2018-03-21 DIAGNOSIS — I10 Essential (primary) hypertension: Secondary | ICD-10-CM

## 2018-03-21 DIAGNOSIS — I25119 Atherosclerotic heart disease of native coronary artery with unspecified angina pectoris: Secondary | ICD-10-CM

## 2018-03-21 NOTE — Telephone Encounter (Signed)
Pt called to request a medication refill on -atorvastatin (LIPITOR) 80 MG tablet  -metoprolol tartrate (LOPRESSOR) 25 MG tablet  To [facility] pharmacy. Please follow up

## 2018-03-22 MED ORDER — ATORVASTATIN CALCIUM 80 MG PO TABS: 80.0000 mg | ORAL_TABLET | Freq: Every day | ORAL | 1 refills | Status: DC

## 2018-03-22 MED ORDER — METOPROLOL TARTRATE 25 MG PO TABS: 25.0000 mg | ORAL_TABLET | Freq: Two times a day (BID) | ORAL | 1 refills | Status: DC

## 2018-03-22 MED FILL — TRIAMCINOLONE 0.1% CREAM: 0.1 | 20 days supply | Qty: 80 | Fill #1

## 2018-03-22 MED FILL — ATORVASTATIN 80 MG TABLET: 80 | 30 days supply | Qty: 30 | Fill #0

## 2018-03-22 MED FILL — ?METOPROLOL TARTRATE 25 MG: 25 | 30 days supply | Qty: 60 | Fill #0

## 2018-03-29 MED FILL — ISOSORBIDE MN ER 30 MG TAB: 30 | 30 days supply | Qty: 30 | Fill #4

## 2018-03-29 MED FILL — ?PANTOPRAZOLE SO DR 40MG TA: 40 | 30 days supply | Qty: 30 | Fill #2

## 2018-03-29 MED FILL — ?CLOPIDOGREL 75MG TA: 75 | 30 days supply | Qty: 30 | Fill #4

## 2018-03-29 MED FILL — LOSARTAN POTASSIUM 100 MG T: 100 | 30 days supply | Qty: 30 | Fill #8

## 2018-04-10 ENCOUNTER — Ambulatory Visit: Payer: No Typology Code available for payment source | Admitting: Family Medicine

## 2018-04-10 ENCOUNTER — Encounter

## 2018-04-13 ENCOUNTER — Other Ambulatory Visit: Payer: Self-pay | Admitting: Family Medicine

## 2018-04-13 DIAGNOSIS — L300 Nummular dermatitis: Secondary | ICD-10-CM

## 2018-04-14 MED FILL — TRIAMCINOLONE 0.1% CREAM: 0.1 | 20 days supply | Qty: 80 | Fill #0

## 2018-04-20 ENCOUNTER — Encounter: Payer: Self-pay | Admitting: Internal Medicine

## 2018-04-20 ENCOUNTER — Ambulatory Visit: Payer: No Typology Code available for payment source | Admitting: Internal Medicine

## 2018-04-20 VITALS — BP 104/60 | HR 82 | Ht 67.0 in | Wt 201.8 lb

## 2018-04-20 DIAGNOSIS — I10 Essential (primary) hypertension: Secondary | ICD-10-CM

## 2018-04-20 DIAGNOSIS — Z955 Presence of coronary angioplasty implant and graft: Secondary | ICD-10-CM

## 2018-04-20 DIAGNOSIS — E785 Hyperlipidemia, unspecified: Secondary | ICD-10-CM

## 2018-04-20 LAB — LIPID PANEL
CHOLESTEROL TOTAL: 168 mg/dL (ref 100–199)
Chol/HDL Ratio: 3.8 ratio (ref 0.0–5.0)
HDL: 44 mg/dL (ref >39)
TRIGLYCERIDES: 430 mg/dL — AB (ref 0–149)

## 2018-04-20 NOTE — Patient Instructions (Signed)
Medication Instructions:   STOP plavix STOP imdur START aspirin 81mg  daily  Labwork:  FASTING lab work today to check cholesterol  Testing/Procedures:  NONE  Follow-Up:  Your physician wants you to follow-up in: 6 months with Dr. Debara Pickett. You will receive a reminder letter in the mail two months in advance. If you don't receive a letter, please call our office to schedule the follow-up appointment.   If you need a refill on your cardiac medications before your next appointment, please call your pharmacy.  Any Other Special Instructions Will Be Listed Below (If Applicable).

## 2018-04-20 NOTE — Progress Notes (Signed)
OFFICE CONSULT NOTE  Chief Complaint:  No complaints  Primary Care Physician: Charlott Rakes, MD  HPI:  Richard Calhoun is a 57 y.o. male who is being seen today for the evaluation of tachycardia at the request of Charlott Rakes, MD. Richard Calhoun presents today for evaluation of tachycardia and syncope. He has a past medical history significant for chronic pain, dyslipidemia, hypertension and an 80-pack-year smoking history, currently he is quit but uses baby. He also has significant alcohol dependence drinking at least a fifth of rum daily. He has not worked in some time. Recently he's been having some tachycardia and passed out. He felt his heart racing at times which would happen spontaneously and he would become more short of breath and then dizzy. He said the dizziness progressed the point where he was either presyncopal and one time actually did pass out. He had a recent echo which showed normal systolic function and moderate LVH with diastolic dysfunction. He apparently was started on metoprolol tartrate 12.5 mg twice a day. He said since starting this medicine his heart rate is much lower and he feels almost no palpitations anymore. In general he feels much better. Blood pressure is also better controlled today 138/70. Heart rate is 85. He denies any chest pain. There is no significant history of heart disease in the family although his mother did die with a stroke at advanced age. He does have a sister who had kidney transplant who lives in [place].   07/20/2017  Richard Calhoun returns today for follow-up of his recent hospitalization for chest pain.  When I saw him in the office he was complaining of tachycardia and was started on some beta-blocker.  An echo showed no systolic dysfunction or regional wall motion abnormalities.  Subsequently developed chest pain and presented to the emergency department.  He was referred for cardiac catheterization and was found to have two-vessel coronary disease with  a 90% LAD stenosis and a 100% RCA occlusion which was collateralized.  There was some indistinct behavior abnormalities during his admission which was thought to be due to alcohol withdrawal however he claims that he was overmedicated and does not drink enough alcohol to lead to any withdrawal.  This initially precluded coronary intervention however he did undergo intervention to the LAD by Dr. Fletcher Anon.  He reports compliance with his medications after discharge except for the metoprolol which he is only taking once a day.  Recently saw his primary care provider prior to Christmas and had extremely high blood pressure.  He was referred to the emergency department however he said with lack of health insurance and the way that he was "treated" he did not want to go to the emergency department.  He says he also owes [place] about $85,000.  08/02/2017  Richard Calhoun returns today for follow-up.  He has had marked improvement in his blood pressure on the changes with his medications.  Today's blood pressure is 122/73 and is generally appeared to be like this at home.  He is very surprised as he is used to his blood pressure running typically in the 024O systolic.  The other day he did have some lightheadedness, and I wonder if this could have been perhaps overtreatment.  He said he did feel better after eating.  04/20/2018  Richard Calhoun is seen today in follow-up.  Overall he is doing much better.  His blood pressure actually is somewhat low today.  He says he is felt a little dizzy the  past few days and I wonder if this is related to lower blood pressure.  He denies any further anginal symptoms.  He did have some intermittent blood in his stool while on aspirin and Plavix.  He discontinued his aspirin and noted a improved immediately.  I suspect it was not just aspirin whether dual antiplatelet therapy.  He is now more than a year out since his heart catheterization and can be on monotherapy.  I would recommend  aspirin alone.  PMHx:  Past Medical History:  Diagnosis Date  . ALLERGIC RHINITIS   . ANXIETY   . BICEPS TENDON RUPTURE, RIGHT   . DEPENDENCE, ALCOHOL NEC/NOS, UNSPECIFIED   . DEPRESSION   . GERD   . HYPERLIPIDEMIA   . HYPERTENSION   . Impaired glucose tolerance   . OSTEOARTHRITIS   . Tubular adenoma of colon 06/2006    Past Surgical History:  Procedure Laterality Date  . CORONARY ATHERECTOMY N/A 04/20/2017   Procedure: CORONARY ATHERECTOMY;  Surgeon: Wellington Hampshire, MD;  Location: [place] [facility];  Service: Cardiovascular;  Laterality: N/A;  . CORONARY STENT INTERVENTION N/A 04/20/2017   Procedure: CORONARY STENT INTERVENTION;  Surgeon: Wellington Hampshire, MD;  Location: [place] [facility];  Service: Cardiovascular;  Laterality: N/A;  . FOOT SURGERY    . INTRAVASCULAR PRESSURE WIRE/FFR STUDY N/A 04/15/2017   Procedure: INTRAVASCULAR PRESSURE WIRE/FFR STUDY;  Surgeon: Belva Crome, MD;  Location: [place] [place] [facility];  Service: Cardiovascular;  Laterality: N/A;  . LEFT HEART CATH AND CORONARY ANGIOGRAPHY N/A 04/15/2017   Procedure: LEFT HEART CATH AND CORONARY ANGIOGRAPHY;  Surgeon: Belva Crome, MD;  Location: [facility];  Service: Cardiovascular;  Laterality: N/A;  . MANDIBLE FRACTURE SURGERY      FAMHx:  Family History  Problem Relation Age of Onset  . Kidney disease Sister   . Stroke Mother   . Stroke Father     SOCHx:   reports that he has been smoking e-cigarettes. He has never used smokeless tobacco. He reports that he drinks alcohol. He reports that he does not use drugs.  ALLERGIES:  Allergies  Allergen Reactions  . Penicillins Other (See Comments)    Childhood reaction Has patient had a PCN reaction causing immediate rash, facial/tongue/throat swelling, SOB or lightheadedness with hypotension: Unknown Has patient had a PCN reaction causing severe rash involving mucus membranes or skin necrosis: Unknown Has patient had a PCN reaction that required  hospitalization: No Has patient had a PCN reaction occurring within the last 10 years: No If all of the above answers are "NO", then may proceed with Cephalosporin use.     ROS: Pertinent items noted in HPI and remainder of comprehensive ROS otherwise negative.  HOME MEDS: Current Outpatient Medications on File Prior to Visit  Medication Sig Dispense Refill  . atorvastatin (LIPITOR) 80 MG tablet Take 1 tablet (80 mg total) by mouth daily at 6 PM. 30 tablet 1  . clopidogrel (PLAVIX) 75 MG tablet Take 1 tablet (75 mg total) by mouth daily. 30 tablet 5  . clotrimazole-betamethasone (LOTRISONE) cream Apply 1 application topically 2 (two) times daily. X 3 weeks then prn 45 g 3  . hydrOXYzine (ATARAX/VISTARIL) 25 MG tablet Take 1 tablet (25 mg total) by mouth 3 (three) times daily as needed. 60 tablet 0  . isosorbide mononitrate (IMDUR) 30 MG 24 hr tablet Take 1 tablet (30 mg total) by mouth daily. 30 tablet 5  . losartan (COZAAR) 100 MG tablet  Take 1 tablet (100 mg total) by mouth daily. 90 tablet 3  . meloxicam (MOBIC) 7.5 MG tablet Take 1 tablet (7.5 mg total) by mouth daily. 30 tablet 0  . metoprolol tartrate (LOPRESSOR) 25 MG tablet Take 1 tablet (25 mg total) by mouth 2 (two) times daily. 60 tablet 1  . multivitamin-iron-minerals-folic acid (CENTRUM) chewable tablet Chew 1 tablet by mouth daily.    . nitroGLYCERIN (NITROSTAT) 0.4 MG SL tablet Place 1 tablet (0.4 mg total) under the tongue every 5 (five) minutes as needed for chest pain. 30 tablet 0  . pantoprazole (PROTONIX) 40 MG tablet TAKE 1 TABLET (40 MG TOTAL) BY MOUTH DAILY. 30 tablet 2  . triamcinolone cream (KENALOG) 0.1 % APPLY TOPICALLY 2 TIMES DAILY. 80 g 1  . vitamin C (ASCORBIC ACID) 500 MG tablet Take 500 mg by mouth daily.     No current facility-administered medications on file prior to visit.     LABS/IMAGING: No results found for this or any previous visit (from the past 48 hour(s)). No results found.  LIPID  PANEL:    Component Value Date/Time   CHOL 189 04/15/2017 0234   TRIG 141 04/15/2017 0234   TRIG 70 07/18/2006 1301   HDL 62 04/15/2017 0234   CHOLHDL 3.0 04/15/2017 0234   VLDL 28 04/15/2017 0234   LDLCALC 99 04/15/2017 0234   LDLDIRECT 165 (H) 08/16/2013 1732    WEIGHTS: Wt Readings from Last 3 Encounters:  04/20/18 201 lb 12.8 oz (91.5 kg)  11/10/17 199 lb 9.6 oz (90.5 kg)  10/19/17 198 lb 12.8 oz (90.2 kg)    VITALS: BP 104/60   Pulse 82   Ht 5\' 7"  (1.702 m)   Wt 201 lb 12.8 oz (91.5 kg)   BMI 31.61 kg/m   EXAM: General appearance: alert and no distress Neck: no carotid bruit, no JVD and thyroid not enlarged, symmetric, no tenderness/mass/nodules Lungs: clear to auscultation bilaterally Heart: regular rate and rhythm Abdomen: soft, non-tender; bowel sounds normal; no masses,  no organomegaly Extremities: extremities normal, atraumatic, no cyanosis or edema Pulses: 2+ and symmetric Skin: Skin color, texture, turgor normal. No rashes or lesions Neurologic: Grossly normal Psych: Pleasant  EKG: Sinus rhythm first-degree AV block at 82-personally reviewed  ASSESSMENT: 1. Coronary artery disease with an occluded RCA collateralized and a 90% LAD stenosis status post DES (04/2017) 2. Hypertensive heart disease 3. Hypertension 4. Dyslipidemia 5. Alcohol dependence - recently quit 6. Former smoker (now [employer])  PLAN: 1.   Richard Calhoun is doing well without any current recurrent anginal symptoms.  He has had some dizziness recently and low blood pressure today.  Will plan to discontinue isosorbide.  He will also stop Plavix.  I recommend starting low-dose aspirin 81 mg daily.  He is due for a lipid profile which were obtained today as he is fasting.  Finally we discussed vaping which he does.  He understands based on recent news reports that there is increased risk of death related to pulmonary disease with this.  Follow-up in 6 months.  Pixie Casino, MD, [facility], [place] Director of the [facility] Lipid Disorders &  Cardiovascular Risk Reduction [facility] Diplomate of the American Board of Clinical Lipidology Attending Cardiologist  Direct Dial: [phone]  Fax: [phone]  Website:  [email or web address] 04/20/2018, 10:38 AM

## 2018-04-24 ENCOUNTER — Ambulatory Visit: Payer: No Typology Code available for payment source | Attending: Family Medicine | Admitting: Family Medicine

## 2018-04-24 ENCOUNTER — Encounter: Payer: Self-pay | Admitting: Family Medicine

## 2018-04-24 VITALS — BP 125/68 | HR 93 | Temp 97.9°F | Ht 67.0 in | Wt 177.0 lb

## 2018-04-24 DIAGNOSIS — K0889 Other specified disorders of teeth and supporting structures: Secondary | ICD-10-CM

## 2018-04-24 DIAGNOSIS — L309 Dermatitis, unspecified: Secondary | ICD-10-CM | POA: Insufficient documentation

## 2018-04-24 DIAGNOSIS — R21 Rash and other nonspecific skin eruption: Secondary | ICD-10-CM

## 2018-04-24 DIAGNOSIS — F329 Major depressive disorder, single episode, unspecified: Secondary | ICD-10-CM | POA: Insufficient documentation

## 2018-04-24 DIAGNOSIS — Z7902 Long term (current) use of antithrombotics/antiplatelets: Secondary | ICD-10-CM | POA: Insufficient documentation

## 2018-04-24 DIAGNOSIS — F419 Anxiety disorder, unspecified: Secondary | ICD-10-CM | POA: Insufficient documentation

## 2018-04-24 DIAGNOSIS — K219 Gastro-esophageal reflux disease without esophagitis: Secondary | ICD-10-CM

## 2018-04-24 DIAGNOSIS — Z23 Encounter for immunization: Secondary | ICD-10-CM

## 2018-04-24 DIAGNOSIS — Z79899 Other long term (current) drug therapy: Secondary | ICD-10-CM | POA: Insufficient documentation

## 2018-04-24 DIAGNOSIS — Z955 Presence of coronary angioplasty implant and graft: Secondary | ICD-10-CM | POA: Insufficient documentation

## 2018-04-24 DIAGNOSIS — I25119 Atherosclerotic heart disease of native coronary artery with unspecified angina pectoris: Secondary | ICD-10-CM

## 2018-04-24 DIAGNOSIS — Z7982 Long term (current) use of aspirin: Secondary | ICD-10-CM | POA: Insufficient documentation

## 2018-04-24 DIAGNOSIS — I10 Essential (primary) hypertension: Secondary | ICD-10-CM

## 2018-04-24 DIAGNOSIS — E785 Hyperlipidemia, unspecified: Secondary | ICD-10-CM | POA: Insufficient documentation

## 2018-04-24 MED ORDER — ATORVASTATIN CALCIUM 80 MG PO TABS: 80.0000 mg | ORAL_TABLET | Freq: Every day | ORAL | 6 refills | Status: DC

## 2018-04-24 MED ORDER — PANTOPRAZOLE SODIUM 40 MG PO TBEC: 40.0000 mg | DELAYED_RELEASE_TABLET | Freq: Every day | ORAL | 6 refills | Status: DC

## 2018-04-24 MED ORDER — METOPROLOL TARTRATE 25 MG PO TABS: 25.0000 mg | ORAL_TABLET | Freq: Two times a day (BID) | ORAL | 6 refills | Status: DC

## 2018-04-25 ENCOUNTER — Encounter: Payer: Self-pay | Admitting: Family Medicine

## 2018-04-25 MED FILL — ATORVASTATIN 80 MG TABLET: 80 | 30 days supply | Qty: 30 | Fill #0

## 2018-04-25 MED FILL — ?PANTOPRAZOLE SO DR 40MG TA: 40 | 30 days supply | Qty: 30 | Fill #0

## 2018-04-25 MED FILL — METOPROLOL TARTRATE 25 MG T: 25 | 30 days supply | Qty: 60 | Fill #0

## 2018-04-25 NOTE — Progress Notes (Signed)
Subjective:  Patient ID: Richard Calhoun, male    DOB: [birth year]  Age: 57 y.o. MRN: [id number]  CC: Rash   HPI Richard Calhoun is a 57 year old male with a history of hypertension, hyperlipidemia, previous tobacco abuse, CAD status post DES in 04/2017 who presents today for follow-up. He was seen by cardiology on 04/20/2018 at which time isosorbide was added for better blood pressure control and his Plavix was discontinued as he has completed 12 months of dual antiplatelet therapy with Plavix and aspirin and recommendation was to continue aspirin. He denies chest pains, pedal edema, dyspnea, orthopnea. He complains of cavities in his teeth and is requesting a dental referral as he will also need cleaning as well. He has had an intermittent rash for the last 5 to 6 months to which he has applied triamcinolone.  Rash initially looked like psoriasis and he noticed some improvement with his topical steroid.  He does have residual rash on his anterior chest wall, lower back and dorsum of his hands which is sometimes itchy.  He has an upcoming appointment with dermatology next month.  Past Medical History:  Diagnosis Date  . ALLERGIC RHINITIS   . ANXIETY   . BICEPS TENDON RUPTURE, RIGHT   . DEPENDENCE, ALCOHOL NEC/NOS, UNSPECIFIED   . DEPRESSION   . GERD   . HYPERLIPIDEMIA   . HYPERTENSION   . Impaired glucose tolerance   . OSTEOARTHRITIS   . Tubular adenoma of colon 06/2006   Past Surgical History:  Procedure Laterality Date  . CORONARY ATHERECTOMY N/A 04/20/2017   Procedure: CORONARY ATHERECTOMY;  Surgeon: Wellington Hampshire, MD;  Location: [facility];  Service: Cardiovascular;  Laterality: N/A;  . CORONARY STENT INTERVENTION N/A 04/20/2017   Procedure: CORONARY STENT INTERVENTION;  Surgeon: Wellington Hampshire, MD;  Location: [facility] [place] [facility];  Service: Cardiovascular;  Laterality: N/A;  . FOOT SURGERY    . INTRAVASCULAR PRESSURE WIRE/FFR STUDY N/A 04/15/2017   Procedure:  INTRAVASCULAR PRESSURE WIRE/FFR STUDY;  Surgeon: Belva Crome, MD;  Location: [facility];  Service: Cardiovascular;  Laterality: N/A;  . LEFT HEART CATH AND CORONARY ANGIOGRAPHY N/A 04/15/2017   Procedure: LEFT HEART CATH AND CORONARY ANGIOGRAPHY;  Surgeon: Belva Crome, MD;  Location: [facility];  Service: Cardiovascular;  Laterality: N/A;  . MANDIBLE FRACTURE SURGERY       Outpatient Medications Prior to Visit  Medication Sig Dispense Refill  . aspirin EC 81 MG tablet Take 81 mg by mouth daily.    . clotrimazole-betamethasone (LOTRISONE) cream Apply 1 application topically 2 (two) times daily. X 3 weeks then prn 45 g 3  . losartan (COZAAR) 100 MG tablet Take 1 tablet (100 mg total) by mouth daily. 90 tablet 3  . meloxicam (MOBIC) 7.5 MG tablet Take 1 tablet (7.5 mg total) by mouth daily. 30 tablet 0  . multivitamin-iron-minerals-folic acid (CENTRUM) chewable tablet Chew 1 tablet by mouth daily.    . nitroGLYCERIN (NITROSTAT) 0.4 MG SL tablet Place 1 tablet (0.4 mg total) under the tongue every 5 (five) minutes as needed for chest pain. 30 tablet 0  . triamcinolone cream (KENALOG) 0.1 % APPLY TOPICALLY 2 TIMES DAILY. 80 g 1  . vitamin C (ASCORBIC ACID) 500 MG tablet Take 500 mg by mouth daily.    Marland Kitchen atorvastatin (LIPITOR) 80 MG tablet Take 1 tablet (80 mg total) by mouth daily at 6 PM. 30 tablet 1  . hydrOXYzine (ATARAX/VISTARIL) 25 MG tablet  Take 1 tablet (25 mg total) by mouth 3 (three) times daily as needed. 60 tablet 0  . metoprolol tartrate (LOPRESSOR) 25 MG tablet Take 1 tablet (25 mg total) by mouth 2 (two) times daily. 60 tablet 1  . pantoprazole (PROTONIX) 40 MG tablet TAKE 1 TABLET (40 MG TOTAL) BY MOUTH DAILY. 30 tablet 2   No facility-administered medications prior to visit.     ROS Review of Systems  Constitutional: Negative for activity change and appetite change.  HENT: Negative for sinus pressure and sore throat.   Eyes: Negative for visual disturbance.   Respiratory: Negative for cough, chest tightness and shortness of breath.   Cardiovascular: Negative for chest pain and leg swelling.  Gastrointestinal: Negative for abdominal distention, abdominal pain, constipation and diarrhea.  Endocrine: Negative.   Genitourinary: Negative for dysuria.  Musculoskeletal: Negative for joint swelling and myalgias.  Skin: Positive for rash.  Allergic/Immunologic: Negative.   Neurological: Negative for weakness, light-headedness and numbness.  Psychiatric/Behavioral: Negative for dysphoric mood and suicidal ideas.    Objective:  BP 125/68   Pulse 93   Temp 97.9 F (36.6 C) (Oral)   Ht 5\' 7"  (1.702 m)   Wt 177 lb (80.3 kg)   SpO2 97%   BMI 27.72 kg/m   BP/Weight 04/24/2018 04/20/2018 7/40/8144  Systolic BP 818 563 149  Diastolic BP 68 60 7  Wt. (Lbs) 177 201.8 199.6  BMI 27.72 31.61 31.26      Physical Exam  Constitutional: He is oriented to person, place, and time. He appears well-developed and well-nourished.  Cardiovascular: Normal rate, normal heart sounds and intact distal pulses.  No murmur heard. Pulmonary/Chest: Effort normal and breath sounds normal. He has no wheezes. He has no rales. He exhibits no tenderness.  Abdominal: Soft. Bowel sounds are normal. He exhibits no distension and no mass. There is no tenderness.  Musculoskeletal: Normal range of motion.  Neurological: He is alert and oriented to person, place, and time.  Skin:  Sparse erythematous rash on anterior trunk and dorsum of hands     Assessment & Plan:   1. Essential hypertension Controlled Low-sodium diet - metoprolol tartrate (LOPRESSOR) 25 MG tablet; Take 1 tablet (25 mg total) by mouth 2 (two) times daily.  Dispense: 60 tablet; Refill: 6  2. Coronary artery disease involving native coronary artery of native heart with angina pectoris (HCC) Asymptomatic Complete a 12 months of dual antiplatelet therapy Continue aspirin Risk factor modification -  atorvastatin (LIPITOR) 80 MG tablet; Take 1 tablet (80 mg total) by mouth daily at 6 PM.  Dispense: 30 tablet; Refill: 6  3. Rash Nonspecific dermatitis Improving on triamcinolone He has an upcoming appointment with dermatology  4. Gastroesophageal reflux disease, esophagitis presence not specified Controlled - pantoprazole (PROTONIX) 40 MG tablet; Take 1 tablet (40 mg total) by mouth daily.  Dispense: 30 tablet; Refill: 6  5. Pain, dental He has some cavities and will need dental cleaning - Ambulatory referral to Dentistry  6. Need for immunization against influenza - Flu Vaccine QUAD 36+ mos IM   Meds ordered this encounter  Medications  . metoprolol tartrate (LOPRESSOR) 25 MG tablet    Sig: Take 1 tablet (25 mg total) by mouth 2 (two) times daily.    Dispense:  60 tablet    Refill:  6  . atorvastatin (LIPITOR) 80 MG tablet    Sig: Take 1 tablet (80 mg total) by mouth daily at 6 PM.    Dispense:  30 tablet  Refill:  6  . pantoprazole (PROTONIX) 40 MG tablet    Sig: Take 1 tablet (40 mg total) by mouth daily.    Dispense:  30 tablet    Refill:  6    Follow-up: Return in about 6 months (around 10/24/2018) for Follow-up of chronic medical conditions.   Charlott Rakes MD

## 2018-04-26 MED FILL — TRIAMCINOLONE ACETONIDE 0.1: 0.1 | 15 days supply | Qty: 60 | Fill #1

## 2018-04-26 MED FILL — LOSARTAN POTASSIUM 100 MG T: 100 | 30 days supply | Qty: 30 | Fill #9

## 2018-05-25 MED FILL — ?METOPROLOL TARTRATE 25 MG: 25 | 30 days supply | Qty: 60 | Fill #1

## 2018-05-25 MED FILL — LOSARTAN POTASSIUM 100 MG T: 100 | 30 days supply | Qty: 30 | Fill #10

## 2018-05-25 MED FILL — ?PANTOPRAZOLE SOD DR 40MG T: 40 | 30 days supply | Qty: 30 | Fill #1

## 2018-05-25 MED FILL — ATORVASTATIN 80 MG TABLET: 80 | 30 days supply | Qty: 30 | Fill #1

## 2018-05-26 ENCOUNTER — Ambulatory Visit: Payer: Self-pay | Attending: Family Medicine

## 2018-06-09 ENCOUNTER — Ambulatory Visit: Payer: Self-pay | Attending: Family Medicine | Admitting: Physician Assistant

## 2018-06-09 VITALS — BP 150/82 | HR 83 | Temp 98.4°F | Ht 67.0 in | Wt 209.0 lb

## 2018-06-09 DIAGNOSIS — M1990 Unspecified osteoarthritis, unspecified site: Secondary | ICD-10-CM | POA: Insufficient documentation

## 2018-06-09 DIAGNOSIS — L42 Pityriasis rosea: Secondary | ICD-10-CM | POA: Insufficient documentation

## 2018-06-09 DIAGNOSIS — E785 Hyperlipidemia, unspecified: Secondary | ICD-10-CM | POA: Insufficient documentation

## 2018-06-09 DIAGNOSIS — K219 Gastro-esophageal reflux disease without esophagitis: Secondary | ICD-10-CM | POA: Insufficient documentation

## 2018-06-09 DIAGNOSIS — Z7982 Long term (current) use of aspirin: Secondary | ICD-10-CM | POA: Insufficient documentation

## 2018-06-09 DIAGNOSIS — Z79899 Other long term (current) drug therapy: Secondary | ICD-10-CM | POA: Insufficient documentation

## 2018-06-09 DIAGNOSIS — L300 Nummular dermatitis: Secondary | ICD-10-CM | POA: Insufficient documentation

## 2018-06-09 DIAGNOSIS — Z880 Allergy status to penicillin: Secondary | ICD-10-CM | POA: Insufficient documentation

## 2018-06-09 DIAGNOSIS — Z791 Long term (current) use of non-steroidal anti-inflammatories (NSAID): Secondary | ICD-10-CM | POA: Insufficient documentation

## 2018-06-09 DIAGNOSIS — I10 Essential (primary) hypertension: Secondary | ICD-10-CM | POA: Insufficient documentation

## 2018-06-09 MED ORDER — CETIRIZINE HCL 10 MG PO TABS: 10.0000 mg | ORAL_TABLET | Freq: Every day | ORAL | 11 refills | Status: DC

## 2018-06-09 MED ORDER — TRIAMCINOLONE ACETONIDE 0.1 % EX CREA: TOPICAL_CREAM | CUTANEOUS | 1 refills | Status: DC

## 2018-06-09 MED FILL — TRIAMCINOLONE ACETONIDE 0.1: 0.1 | 30 days supply | Qty: 80 | Fill #0

## 2018-06-09 MED FILL — ?CETIRIZINE HCL 10 MG TABLE: 10 | 30 days supply | Qty: 30 | Fill #0

## 2018-06-09 NOTE — Progress Notes (Signed)
Patient saw dermatology and was given medication patient states that rash is worse after medication.

## 2018-06-09 NOTE — Progress Notes (Signed)
Patient ID: Richard Calhoun, male   DOB: August 31, 1960, 57 y.o.   MRN: [id number]   Richard Calhoun, is a 57 y.o. male  [id number]  [id number]:[id number]  DOB - [birth year]  Subjective:  Chief Complaint and HPI: Richard Calhoun is a 57 y.o. male here today with onset of a new rash.  He went to the dermatologist and was told to use Head and Shoulders shampoo to wash his body, benzoyl peroxide to the rash he was sent for, and dry skin lotion.  He used baby oil too.  2 days after starting this, he started to break out in a different rash on his back, trunk, chest, buttocks, and legs.  It itches.  He also has felt more tired the last week or 2 and had some mild HA.  No fevers.    Also needs RF on cream for dishydrotic eczema  Social:lives with mom-not SA  ROS:   Constitutional:  No f/c, No night sweats, No unexplained weight loss. EENT:  No vision changes, No blurry vision, No hearing changes. No mouth, throat, or ear problems.  Respiratory: No cough, No SOB Cardiac: No CP, no palpitations GI:  No abd pain, No N/V/D. GU: No Urinary s/sx Musculoskeletal: No joint pain Neuro: mild headache, no dizziness, no motor weakness.  Skin: + rash Endocrine:  No polydipsia. No polyuria.  Psych: Denies SI/HI  No problems updated.  ALLERGIES: Allergies  Allergen Reactions  . Penicillins Other (See Comments)    Childhood reaction Has patient had a PCN reaction causing immediate rash, facial/tongue/throat swelling, SOB or lightheadedness with hypotension: Unknown Has patient had a PCN reaction causing severe rash involving mucus membranes or skin necrosis: Unknown Has patient had a PCN reaction that required hospitalization: No Has patient had a PCN reaction occurring within the last 10 years: No If all of the above answers are "NO", then may proceed with Cephalosporin use.     PAST MEDICAL HISTORY: Past Medical History:  Diagnosis Date  . ALLERGIC RHINITIS   . ANXIETY   . BICEPS TENDON RUPTURE, RIGHT   .  DEPENDENCE, ALCOHOL NEC/NOS, UNSPECIFIED   . DEPRESSION   . GERD   . HYPERLIPIDEMIA   . HYPERTENSION   . Impaired glucose tolerance   . OSTEOARTHRITIS   . Tubular adenoma of colon 06/2006    MEDICATIONS AT HOME: Prior to Admission medications   Medication Sig Start Date End Date Taking? Authorizing Provider  aspirin EC 81 MG tablet Take 81 mg by mouth daily.   Yes [provider]  atorvastatin (LIPITOR) 80 MG tablet Take 1 tablet (80 mg total) by mouth daily at 6 PM. 04/24/18  Yes Newlin, Enobong, MD  clotrimazole-betamethasone (LOTRISONE) cream Apply 1 application topically 2 (two) times daily. X 3 weeks then prn 11/10/17  Yes McClung, Angela M, PA-C  losartan (COZAAR) 100 MG tablet Take 1 tablet (100 mg total) by mouth daily. 07/20/17  Yes Hilty, Nadean Corwin, MD  meloxicam (MOBIC) 7.5 MG tablet Take 1 tablet (7.5 mg total) by mouth daily. 11/10/17  Yes Freeman Caldron M, PA-C  metoprolol tartrate (LOPRESSOR) 25 MG tablet Take 1 tablet (25 mg total) by mouth 2 (two) times daily. 04/24/18  Yes Charlott Rakes, MD  multivitamin-iron-minerals-folic acid (CENTRUM) chewable tablet Chew 1 tablet by mouth daily.   Yes [provider]  nitroGLYCERIN (NITROSTAT) 0.4 MG SL tablet Place 1 tablet (0.4 mg total) under the tongue every 5 (five) minutes as needed for chest pain. 04/21/17  Yes Landis Gandy  G, MD  pantoprazole (PROTONIX) 40 MG tablet Take 1 tablet (40 mg total) by mouth daily. 04/24/18  Yes Charlott Rakes, MD  triamcinolone cream (KENALOG) 0.1 % APPLY TOPICALLY 2 TIMES DAILY on hands 06/09/18  Yes McClung, Angela M, PA-C  vitamin C (ASCORBIC ACID) 500 MG tablet Take 500 mg by mouth daily.   Yes [provider]  cetirizine (ZYRTEC) 10 MG tablet Take 1 tablet (10 mg total) by mouth daily. 06/09/18   Argentina Donovan, PA-C     Objective:  EXAM:   Vitals:   06/09/18 1110  BP: (!) 150/82  Pulse: 83  Temp: 98.4 F (36.9 C)  TempSrc: Oral  SpO2: 93%  Weight:  209 lb (94.8 kg)  Height: 5\' 7"  (1.702 m)    General appearance : A&OX3. NAD. Non-toxic-appearing HEENT: Atraumatic and Normocephalic.  PERRLA. EOM intact.   Mouth-MMM, post pharynx WNL w/o erythema, No PND. Neck: supple, no JVD. No cervical lymphadenopathy. No thyromegaly Chest/Lungs:  Breathing-non-labored, Good air entry bilaterally, breath sounds normal without rales, rhonchi, or wheezing  CVS: S1 S2 regular, no murmurs, gallops, rubs  Extremities: Bilateral Lower Ext shows no edema, both legs are warm to touch with = pulse throughout Neurology:  CN II-XII grossly intact, Non focal.   Psych:  TP linear. J/I WNL. Normal speech. Appropriate eye contact and affect.  Skin:  Rash-multiple oval fawn plaques on BU&LE, chest trunk and buttocks.  Uncertain of definite herald patch.   Data Review Lab Results  Component Value Date   HGBA1C 5.9 (H) 04/15/2017   HGBA1C 5.6 12/23/2016   HGBA1C 6.0 08/29/2012     Assessment & Plan   1. Pityriasis rosea This rash appears like PR and would fit with his other vague symptoms leading up to the rash.  I do not think this is the same rash as previously or being caused by the regimen his dermatologist started him on.   Stop baby oil OK to use Head and Shoulder as directed Use benzoyl peroxide only on pimples if you have them. It is ok to use a dry skin lotion such as "Cetaphil" lotion. - cetirizine (ZYRTEC) 10 MG tablet; Take 1 tablet (10 mg total) by mouth daily.  Dispense: 30 tablet; Refill: 11  2. Nummular eczema - triamcinolone cream (KENALOG) 0.1 %; APPLY TOPICALLY 2 TIMES DAILY on hands  Dispense: 80 g; Refill: 1     Patient have been counseled extensively about nutrition and exercise  Return in about 3 weeks (around 06/30/2018) for with me to recheck rash.  The patient was given clear instructions to go to ER or return to medical center if symptoms don't improve, worsen or new problems develop. The patient verbalized understanding.  The patient was told to call to get lab results if they haven't heard anything in the next week.     Freeman Caldron, PA-C [facility] and [facility] [place], [place]   06/09/2018, 1:15 PM

## 2018-06-09 NOTE — Patient Instructions (Addendum)
Stop baby oil OK to use Head and Shoulder as directed Use benzoyl peroxide only on pimples if you have them. It is ok to use a dry skin lotion such as "Cetaphil" lotion.     Pityriasis Rosea Pityriasis rosea is a rash that usually appears on the trunk of the body. It may also appear on the upper arms and upper legs. It usually begins as a single patch, and then more patches begin to develop. The rash may cause mild itching, but it normally does not cause other problems. It usually goes away without treatment. However, it may take weeks or months for the rash to go away completely. What are the causes? The cause of this condition is not known. The condition does not spread from person to person (is noncontagious). What increases the risk? This condition is more likely to develop in young adults and children. It is most common in the spring and fall. What are the signs or symptoms? The main symptom of this condition is a rash.  The rash usually begins with a single oval patch that is larger than the ones that follow. This is called a herald patch. It generally appears a week or more before the rest of the rash appears.  When more patches start to develop, they spread quickly on the trunk, back, and arms. These patches are smaller than the first one.  The patches that make up the rash are usually oval-shaped and pink or red in color. They are usually flat, but they may sometimes be raised so that they can be felt with a finger. They may also be finely crinkled and have a scaly ring around the edge.  The rash does not typically appear on areas of the skin that are exposed to the sun.  Most people who have this condition do not have other symptoms, but some have mild itching. In a few cases, a mild headache or body aches may occur before the rash appears and then go away. How is this diagnosed? Your health care provider may diagnose this condition by doing a physical exam and taking your  medical history. To rule out other possible causes for the rash, the health care provider may order blood tests or take a skin sample from the rash to be looked at under a microscope. How is this treated? Usually, treatment is not needed for this condition. The rash will probably go away on its own in 4-8 weeks. In some cases, a health care provider may recommend or prescribe medicine to reduce itching. Follow these instructions at home:  Take medicines only as directed by your health care provider.  Avoid scratching the affected areas of skin.  Do not take hot baths or use a sauna. Use only warm water when bathing or showering. Heat can increase itching. Contact a health care provider if:  Your rash does not go away in 8 weeks.  Your rash gets much worse.  You have a fever.  You have swelling or pain in the rash area.  You have fluid, blood, or pus coming from the rash area. This information is not intended to replace advice given to you by your health care provider. Make sure you discuss any questions you have with your health care provider. Document Released: 08/11/2001 Document Revised: 12/11/2015 Document Reviewed: 06/12/2014 Elsevier Interactive Patient Education  [employer].

## 2018-06-28 MED FILL — ?PANTOPRAZOLE SOD DR 40MG T: 40 | 30 days supply | Qty: 30 | Fill #2

## 2018-06-28 MED FILL — ?METOPROLOL TARTRATE 25 MG: 25 | 30 days supply | Qty: 60 | Fill #2

## 2018-06-28 MED FILL — ATORVASTATIN 80 MG TABLET: 80 | 30 days supply | Qty: 30 | Fill #2

## 2018-06-29 ENCOUNTER — Other Ambulatory Visit (HOSPITAL_COMMUNITY)
Admission: RE | Admit: 2018-06-29 | Discharge: 2018-06-29 | Disposition: A | Discharge status: Home / Self Care | Payer: Self-pay | Source: Ambulatory Visit | Attending: Family Medicine | Admitting: Family Medicine

## 2018-06-29 ENCOUNTER — Ambulatory Visit: Payer: Self-pay | Attending: Family Medicine | Admitting: Physician Assistant

## 2018-06-29 ENCOUNTER — Other Ambulatory Visit: Payer: Self-pay

## 2018-06-29 VITALS — BP 169/97 | HR 90 | Temp 99.0°F | Resp 16 | Wt 207.0 lb

## 2018-06-29 DIAGNOSIS — R21 Rash and other nonspecific skin eruption: Secondary | ICD-10-CM | POA: Insufficient documentation

## 2018-06-29 DIAGNOSIS — F329 Major depressive disorder, single episode, unspecified: Secondary | ICD-10-CM | POA: Insufficient documentation

## 2018-06-29 DIAGNOSIS — K219 Gastro-esophageal reflux disease without esophagitis: Secondary | ICD-10-CM | POA: Insufficient documentation

## 2018-06-29 DIAGNOSIS — F419 Anxiety disorder, unspecified: Secondary | ICD-10-CM | POA: Insufficient documentation

## 2018-06-29 DIAGNOSIS — Z7982 Long term (current) use of aspirin: Secondary | ICD-10-CM | POA: Insufficient documentation

## 2018-06-29 DIAGNOSIS — E785 Hyperlipidemia, unspecified: Secondary | ICD-10-CM | POA: Insufficient documentation

## 2018-06-29 DIAGNOSIS — Z791 Long term (current) use of non-steroidal anti-inflammatories (NSAID): Secondary | ICD-10-CM | POA: Insufficient documentation

## 2018-06-29 DIAGNOSIS — Z880 Allergy status to penicillin: Secondary | ICD-10-CM | POA: Insufficient documentation

## 2018-06-29 DIAGNOSIS — I10 Essential (primary) hypertension: Secondary | ICD-10-CM | POA: Insufficient documentation

## 2018-06-29 DIAGNOSIS — F1020 Alcohol dependence, uncomplicated: Secondary | ICD-10-CM | POA: Insufficient documentation

## 2018-06-29 DIAGNOSIS — Z79899 Other long term (current) drug therapy: Secondary | ICD-10-CM | POA: Insufficient documentation

## 2018-06-29 MED ORDER — METHYLPREDNISOLONE SODIUM SUCC 125 MG IJ SOLR
125.0000 mg | Freq: Once | INTRAMUSCULAR | Status: AC
  Administered 2018-06-29: 125 mg via INTRAMUSCULAR

## 2018-06-29 MED ORDER — PREDNISONE 10 MG PO TABS: ORAL_TABLET | ORAL | 0 refills | Status: DC

## 2018-06-29 MED ORDER — RANITIDINE HCL 150 MG PO TABS: 150.0000 mg | ORAL_TABLET | Freq: Two times a day (BID) | ORAL | 1 refills | Status: DC

## 2018-06-29 MED ORDER — HYDROXYZINE HCL 25 MG PO TABS: 25.0000 mg | ORAL_TABLET | Freq: Three times a day (TID) | ORAL | 0 refills | Status: DC | PRN

## 2018-06-29 MED FILL — predniSONE 10 MG TABS: 10 | 7 days supply | Qty: 21 | Fill #0

## 2018-06-29 MED FILL — hydrOXYzine HCL 25 MG TABS: 25 | 20 days supply | Qty: 60 | Fill #0

## 2018-06-29 NOTE — Progress Notes (Addendum)
Follow-up  " Red blotches all over" Progressing x 4 weeks

## 2018-06-29 NOTE — Progress Notes (Signed)
Patient ID: Richard Calhoun, male   DOB: [birth year], 59 y.o.   MRN: [id number]   Richard Calhoun, is a 57 y.o. male  [id number]  [id number]  DOB - 1961-06-20  Subjective:  Chief Complaint and HPI: Richard Calhoun is a 57 y.o. male here today with rash that seems to be worsening instead of improving.  + itching.  Zyrtec not helping with itching.  Still no fevers.  See last 2 notes.  He has seen dermatology.  No biopsies done.   ROS:   Constitutional:  No f/c, No night sweats, No unexplained weight loss. EENT:  No vision changes, No blurry vision, No hearing changes. No mouth, throat, or ear problems.  Respiratory: No cough, No SOB Cardiac: No CP, no palpitations GI:  No abd pain, No N/V/D. GU: No Urinary s/sx Musculoskeletal: No joint pain Neuro: No headache, no dizziness, no motor weakness.  Skin: + rash Endocrine:  No polydipsia. No polyuria.  Psych: Denies SI/HI  No problems updated.  ALLERGIES: Allergies  Allergen Reactions  . Penicillins Other (See Comments)    Childhood reaction Has patient had a PCN reaction causing immediate rash, facial/tongue/throat swelling, SOB or lightheadedness with hypotension: Unknown Has patient had a PCN reaction causing severe rash involving mucus membranes or skin necrosis: Unknown Has patient had a PCN reaction that required hospitalization: No Has patient had a PCN reaction occurring within the last 10 years: No If all of the above answers are "NO", then may proceed with Cephalosporin use.     PAST MEDICAL HISTORY: Past Medical History:  Diagnosis Date  . ALLERGIC RHINITIS   . ANXIETY   . BICEPS TENDON RUPTURE, RIGHT   . DEPENDENCE, ALCOHOL NEC/NOS, UNSPECIFIED   . DEPRESSION   . GERD   . HYPERLIPIDEMIA   . HYPERTENSION   . Impaired glucose tolerance   . OSTEOARTHRITIS   . Tubular adenoma of colon 06/2006    MEDICATIONS AT HOME: Prior to Admission medications   Medication Sig Start Date End Date Taking? Authorizing Provider    atorvastatin (LIPITOR) 80 MG tablet Take 1 tablet (80 mg total) by mouth daily at 6 PM. 04/24/18  Yes Newlin, Enobong, MD  cetirizine (ZYRTEC) 10 MG tablet Take 1 tablet (10 mg total) by mouth daily. 06/09/18  Yes , Dionne Bucy, PA-C  clotrimazole-betamethasone (LOTRISONE) cream Apply 1 application topically 2 (two) times daily. X 3 weeks then prn 11/10/17  Yes ,  M, PA-C  losartan (COZAAR) 100 MG tablet Take 1 tablet (100 mg total) by mouth daily. 07/20/17  Yes Hilty, Nadean Corwin, MD  meloxicam (MOBIC) 7.5 MG tablet Take 1 tablet (7.5 mg total) by mouth daily. 11/10/17  Yes Freeman Caldron M, PA-C  metoprolol tartrate (LOPRESSOR) 25 MG tablet Take 1 tablet (25 mg total) by mouth 2 (two) times daily. 04/24/18  Yes Charlott Rakes, MD  multivitamin-iron-minerals-folic acid (CENTRUM) chewable tablet Chew 1 tablet by mouth daily.   Yes [provider]  nitroGLYCERIN (NITROSTAT) 0.4 MG SL tablet Place 1 tablet (0.4 mg total) under the tongue every 5 (five) minutes as needed for chest pain. 04/21/17  Yes Georgette Shell, MD  pantoprazole (PROTONIX) 40 MG tablet Take 1 tablet (40 mg total) by mouth daily. 04/24/18  Yes Charlott Rakes, MD  triamcinolone cream (KENALOG) 0.1 % APPLY TOPICALLY 2 TIMES DAILY on hands 06/09/18  Yes ,  M, PA-C  vitamin C (ASCORBIC ACID) 500 MG tablet Take 500 mg by mouth daily.   Yes [provider]  aspirin EC 81 MG tablet Take 81 mg by mouth daily.    [provider]  hydrOXYzine (ATARAX/VISTARIL) 25 MG tablet Take 1 tablet (25 mg total) by mouth 3 (three) times daily as needed. Prn itching 06/29/18   Argentina Donovan, PA-C  predniSONE (DELTASONE) 10 MG tablet 6,5,4,3,2,1 take each days dose in am with food 06/29/18   Freeman Caldron M, PA-C  ranitidine (ZANTAC) 150 MG tablet Take 1 tablet (150 mg total) by mouth 2 (two) times daily. 06/29/18   Argentina Donovan, PA-C     Objective:  EXAM:   Vitals:   06/29/18 1414   BP: (!) 169/97  Pulse: 90  Resp: 16  Temp: 99 F (37.2 C)  TempSrc: Oral  SpO2: 97%  Weight: 207 lb (93.9 kg)    General appearance : A&OX3. NAD. Non-toxic-appearing HEENT: Atraumatic and Normocephalic.  PERRLA. EOM intact.   Chest/Lungs:  Breathing-non-labored, Good air entry bilaterally, breath sounds normal without rales, rhonchi, or wheezing  CVS: S1 S2 regular, no murmurs, gallops, rubs  Extremities: Bilateral Lower Ext shows no edema, both legs are warm to touch with = pulse throughout Neurology:  CN II-XII grossly intact, Non focal.   Psych:  TP linear. J/I WNL. Normal speech. Appropriate eye contact and affect.  Skin:  Oval patches that are erythematous with slight scale.  +dermatographia.  All over trunk, arms worse on legs and buttocks  Procedure:  L upper thigh, anterior aspect. Sterile prep.  1.5 cc 1%plain lidocaine locally.  59mm punch biopsy.  Pressure then silver nitrate X 2 to control bleeding, +bandage.    Data Review Lab Results  Component Value Date   HGBA1C 5.9 (H) 04/15/2017   HGBA1C 5.6 12/23/2016   HGBA1C 6.0 08/29/2012     Assessment & Plan   1. Rash - methylPREDNISolone sodium succinate (SOLU-MEDROL) 125 mg/2 mL injection 125 mg - predniSONE (DELTASONE) 10 MG tablet; 6,5,4,3,2,1 take each days dose in am with food  Dispense: 21 tablet; Refill: 0 - ranitidine (ZANTAC) 150 MG tablet; Take 1 tablet (150 mg total) by mouth 2 (two) times daily.  Dispense: 60 tablet; Refill: 1 - hydrOXYzine (ATARAX/VISTARIL) 25 MG tablet; Take 1 tablet (25 mg total) by mouth 3 (three) times daily as needed. Prn itching  Dispense: 60 tablet; Refill: 0 -punch biopsy for pathology.  2. Uncontrolled hypertension Has Bp cuff at home.  Check BP daily OOO and record and bring to next visit.  Take BP meds as directed.  DASH diet.  Upcoded LOS instead of procedure charge.       Patient have been counseled extensively about nutrition and exercise  Return in about 1 month  (around 07/30/2018) for Dr Margarita Rana- BP and rasch f/up.  The patient was given clear instructions to go to ER or return to medical center if symptoms don't improve, worsen or new problems develop. The patient verbalized understanding. The patient was told to call to get lab results if they haven't heard anything in the next week.     Freeman Caldron, PA-C [facility] and [place], [place]   06/29/2018, 2:57 PM

## 2018-06-30 MED FILL — LOSARTAN POTASSIUM 100 MG T: 100 | 30 days supply | Qty: 30 | Fill #11

## 2018-07-05 ENCOUNTER — Telehealth: Payer: Self-pay | Admitting: *Deleted

## 2018-07-05 NOTE — Telephone Encounter (Signed)
Notes recorded by Carilyn Goodpasture, RN on 07/05/2018 at 5:06 PM EST Left message on voicemail to return call. ------  Notes recorded by Argentina Donovan, PA-C on 07/05/2018 at 9:07 AM EST The biopsy showed the rash to be an allergic reaction to something which is reassuring bc it doesn't appear to be a disease process causing the reaction. It is a possibility it is from one of his medications. If the rash persists, follow-up with the dermatologist or make an appointment with Dr Margarita Rana to see if daily medication regimen needs to be adjusted. Thanks, Freeman Caldron, PA-C

## 2018-07-05 NOTE — Telephone Encounter (Signed)
-----   Message from Argentina Donovan, [place] sent at 07/05/2018  9:07 AM EST ----- The biopsy showed the rash to be an allergic reaction to something which is reassuring bc it doesn't appear to be a disease process causing the reaction.  It is a possibility it is from one of his medications.  If the rash persists, follow-up with the dermatologist or make an appointment with Dr Margarita Rana to see if daily medication regimen needs to be adjusted.  Thanks, Freeman Caldron, PA-C

## 2018-07-06 ENCOUNTER — Telehealth (INDEPENDENT_AMBULATORY_CARE_PROVIDER_SITE_OTHER): Payer: Self-pay

## 2018-07-06 NOTE — Telephone Encounter (Signed)
Patient verified DOB and was notified of the following.The biopsy showed the rash to be an allergic reaction to something which is reassuring bc it doesn't appear to be a disease process causing the reaction. It is a possibility it is from one of his medications. If the rash persists, follow-up with the dermatologist or make an appointment with Dr Margarita Rana to see if daily medication regimen needs to be adjusted. Nat Christen, CMA

## 2018-07-06 NOTE — Telephone Encounter (Signed)
-----   Message from Argentina Donovan, [place] sent at 07/05/2018  9:07 AM EST ----- The biopsy showed the rash to be an allergic reaction to something which is reassuring bc it doesn't appear to be a disease process causing the reaction.  It is a possibility it is from one of his medications.  If the rash persists, follow-up with the dermatologist or make an appointment with Dr Margarita Rana to see if daily medication regimen needs to be adjusted.  Thanks, Freeman Caldron, PA-C

## 2018-07-28 ENCOUNTER — Other Ambulatory Visit: Payer: Self-pay | Admitting: Internal Medicine

## 2018-07-28 DIAGNOSIS — I10 Essential (primary) hypertension: Secondary | ICD-10-CM

## 2018-07-28 MED FILL — LOSARTAN POTASSIUM 100 MG T: 100 | 30 days supply | Qty: 30 | Fill #0

## 2018-07-28 MED FILL — ATORVASTATIN 80 MG TABLET: 80 | 30 days supply | Qty: 30 | Fill #3

## 2018-07-28 MED FILL — ?METOPROLOL TARTRATE 25 MG: 25 | 30 days supply | Qty: 60 | Fill #3

## 2018-07-28 MED FILL — ?PANTOPRAZOLE SO DR 40MG TA: 40 | 30 days supply | Qty: 30 | Fill #3

## 2018-07-28 NOTE — Telephone Encounter (Signed)
Rx request sent to pharmacy.  

## 2018-08-02 ENCOUNTER — Encounter: Payer: Self-pay | Admitting: Family Medicine

## 2018-08-02 ENCOUNTER — Ambulatory Visit: Payer: Self-pay | Attending: Family Medicine | Admitting: Family Medicine

## 2018-08-02 VITALS — BP 121/75 | HR 106 | Temp 98.0°F | Ht 67.0 in | Wt 212.0 lb

## 2018-08-02 DIAGNOSIS — R21 Rash and other nonspecific skin eruption: Secondary | ICD-10-CM

## 2018-08-02 DIAGNOSIS — N62 Hypertrophy of breast: Secondary | ICD-10-CM

## 2018-08-02 NOTE — Progress Notes (Signed)
Subjective:  Patient ID: Richard Calhoun, male    DOB: 06-21-1961  Age: 58 y.o. MRN: [id number]  CC: Hypertension   HPI Richard Calhoun s a 58 year-old male with a history of hypertension, hyperlipidemia, previous tobacco abuse, CAD status post DES in 04/2017 who presents today for follow-up of a rash. He was seen by the PA and treated with IM Solu-Medrol, hydroxyzine and prednisone Dosepak 1 month ago and reports improvement in symptoms but he does have residual rash in his right thigh. 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  He is concerned about the left breast enlargement which was noticed by his wife recently.  He denies intake of OTC supplements or herbal remedies.  On occasion he did have pain otherwise it has been painless.  He denies discharge from nipple.  Past Medical History:  Diagnosis Date  . ALLERGIC RHINITIS   . ANXIETY   . BICEPS TENDON RUPTURE, RIGHT   . DEPENDENCE, ALCOHOL NEC/NOS, UNSPECIFIED   . DEPRESSION   . GERD   . HYPERLIPIDEMIA   . HYPERTENSION   . Impaired glucose tolerance   . OSTEOARTHRITIS   . Tubular adenoma of colon 06/2006    Past Surgical History:  Procedure Laterality Date  . CORONARY ATHERECTOMY N/A 04/20/2017   Procedure: CORONARY ATHERECTOMY;  Surgeon: Wellington Hampshire, MD;  Location: [place] [facility];  Service: Cardiovascular;  Laterality: N/A;  . CORONARY STENT INTERVENTION N/A 04/20/2017   Procedure: CORONARY STENT INTERVENTION;  Surgeon: Wellington Hampshire, MD;  Location: [place] [facility];  Service: Cardiovascular;  Laterality: N/A;  . FOOT SURGERY    . INTRAVASCULAR PRESSURE WIRE/FFR STUDY N/A 04/15/2017   Procedure: INTRAVASCULAR PRESSURE WIRE/FFR STUDY;  Surgeon: Belva Crome, MD;  Location: [place] [facility];  Service: Cardiovascular;  Laterality: N/A;  . LEFT HEART CATH AND CORONARY ANGIOGRAPHY N/A 04/15/2017   Procedure: LEFT HEART CATH AND CORONARY ANGIOGRAPHY;  Surgeon: Belva Crome, MD;   Location: [place] [facility];  Service: Cardiovascular;  Laterality: N/A;  . MANDIBLE FRACTURE SURGERY      Allergies  Allergen Reactions  . Penicillins Other (See Comments)    Childhood reaction Has patient had a PCN reaction causing immediate rash, facial/tongue/throat swelling, SOB or lightheadedness with hypotension: Unknown Has patient had a PCN reaction causing severe rash involving mucus membranes or skin necrosis: Unknown Has patient had a PCN reaction that required hospitalization: No Has patient had a PCN reaction occurring within the last 10 years: No If all of the above answers are "NO", then may proceed with Cephalosporin use.      Outpatient Medications Prior to Visit  Medication Sig Dispense Refill  . atorvastatin (LIPITOR) 80 MG tablet Take 1 tablet (80 mg total) by mouth daily at 6 PM. 30 tablet 6  . cetirizine (ZYRTEC) 10 MG tablet Take 1 tablet (10 mg total) by mouth daily. 30 tablet 11  . clotrimazole-betamethasone (LOTRISONE) cream Apply 1 application topically 2 (two) times daily. X 3 weeks then prn 45 g 3  . hydrOXYzine (ATARAX/VISTARIL) 25 MG tablet Take 1 tablet (25 mg total) by mouth 3 (three) times daily as needed. Prn itching 60 tablet 0  . losartan (COZAAR) 100 MG tablet TAKE 1 TABLET (100 MG TOTAL) BY MOUTH DAILY. 90 tablet 3  . meloxicam (MOBIC) 7.5 MG tablet Take 1 tablet (7.5 mg total) by mouth daily. 30 tablet 0  . metoprolol tartrate (LOPRESSOR) 25 MG tablet Take 1 tablet (  25 mg total) by mouth 2 (two) times daily. 60 tablet 6  . multivitamin-iron-minerals-folic acid (CENTRUM) chewable tablet Chew 1 tablet by mouth daily.    . nitroGLYCERIN (NITROSTAT) 0.4 MG SL tablet Place 1 tablet (0.4 mg total) under the tongue every 5 (five) minutes as needed for chest pain. 30 tablet 0  . pantoprazole (PROTONIX) 40 MG tablet Take 1 tablet (40 mg total) by mouth daily. 30 tablet 6  . predniSONE (DELTASONE) 10 MG tablet 6,5,4,3,2,1 take each days dose in am with  food 21 tablet 0  . ranitidine (ZANTAC) 150 MG tablet Take 1 tablet (150 mg total) by mouth 2 (two) times daily. 60 tablet 1  . triamcinolone cream (KENALOG) 0.1 % APPLY TOPICALLY 2 TIMES DAILY on hands 80 g 1  . vitamin C (ASCORBIC ACID) 500 MG tablet Take 500 mg by mouth daily.    Marland Kitchen aspirin EC 81 MG tablet Take 81 mg by mouth daily.     No facility-administered medications prior to visit.     ROS Review of Systems  Constitutional: Negative for activity change and appetite change.  HENT: Negative for sinus pressure and sore throat.   Eyes: Negative for visual disturbance.  Respiratory: Negative for cough, chest tightness and shortness of breath.   Cardiovascular: Negative for chest pain and leg swelling.  Gastrointestinal: Negative for abdominal distention, abdominal pain, constipation and diarrhea.  Endocrine: Negative.   Genitourinary: Negative for dysuria.  Musculoskeletal: Negative for joint swelling and myalgias.  Skin: Positive for rash.  Allergic/Immunologic: Negative.   Neurological: Negative for weakness, light-headedness and numbness.  Psychiatric/Behavioral: Negative for dysphoric mood and suicidal ideas.    Objective:  BP 121/75   Pulse (!) 106   Temp 98 F (36.7 C) (Oral)   Ht 5\' 7"  (1.702 m)   Wt 212 lb (96.2 kg)   SpO2 91%   BMI 33.20 kg/m   BP/Weight 08/02/2018 06/29/2018 62/94/7654  Systolic BP 650 354 656  Diastolic BP 75 97 82  Wt. (Lbs) 212 207 209  BMI 33.2 32.42 32.73      Physical Exam Constitutional:      Appearance: He is well-developed.  Cardiovascular:     Rate and Rhythm: Tachycardia present.     Heart sounds: Normal heart sounds. No murmur.  Pulmonary:     Effort: Pulmonary effort is normal.     Breath sounds: Normal breath sounds. No wheezing or rales.  Chest:     Chest wall: No tenderness.     Breasts:        Right: No swelling, mass, skin change or tenderness.        Left: Swelling and mass (gynecomastia) present. No skin  change or tenderness.  Abdominal:     General: Bowel sounds are normal. There is no distension.     Palpations: Abdomen is soft. There is no mass.     Tenderness: There is no abdominal tenderness.  Musculoskeletal: Normal range of motion.  Skin:    Comments: Well-circumscribed erythematous blanching patches on anterior right thigh  Neurological:     Mental Status: He is alert and oriented to person, place, and time.      Assessment & Plan:   1. Gynecomastia - MM Digital Diagnostic Bilat; Future - US BREAST LTD UNI LEFT INC AXILLA; Future  2. Rash Improving   No orders of the defined types were placed in this encounter.   Follow-up: Return for Follow-up of chronic medical conditions, keep previously scheduled appointment.  Charlott Rakes MD

## 2018-09-18 MED FILL — ?PANTOPRAZOLE SOD DR 40MG: 40 MG | 30 days supply | Qty: 30 | Fill #4

## 2018-09-18 MED FILL — ?METOPROLOL TARTRATE 25 MG: 25 | 30 days supply | Qty: 60 | Fill #4

## 2018-09-18 MED FILL — ATORVASTATIN 80 MG TABLET: 80 | 30 days supply | Qty: 30 | Fill #4

## 2018-09-18 MED FILL — LOSARTAN POTASSIUM 100 MG T: 100 | 30 days supply | Qty: 30 | Fill #1

## 2018-09-28 ENCOUNTER — Other Ambulatory Visit: Payer: Self-pay

## 2018-09-28 ENCOUNTER — Ambulatory Visit: Payer: Self-pay | Attending: Family Medicine

## 2018-10-02 ENCOUNTER — Telehealth: Payer: Self-pay | Admitting: Family Medicine

## 2018-10-02 NOTE — Telephone Encounter (Signed)
Pt states he was referred to get a mammogram and they didn't take orange card did not have the funds to cover would like to be referred to a place where financial covers

## 2018-10-02 NOTE — Telephone Encounter (Signed)
Patient as called and informed to contact [facility] to schedule appointment.

## 2018-10-03 ENCOUNTER — Telehealth: Payer: Self-pay | Admitting: Internal Medicine

## 2018-10-03 NOTE — Telephone Encounter (Signed)
Pt c/o swelling: STAT is pt has developed SOB within 24 hours  1) How much weight have you gained and in what time span? Do not think so  2) If swelling, where is the swelling located? ankles  3) Are you currently taking a fluid pill? no  4) Are you currently SOB? bo  5) Do you have a log of your daily weights (if so, list)? No- he does not have a scale  6) Have you gained 3 pounds in a day or 5 pounds in a week? He does not know  7) Have you traveled recently? No- pt would like to be seen before 11-14-18

## 2018-10-03 NOTE — Telephone Encounter (Signed)
Called patient, he states that for 3 days he has had swelling in his ankles. Patient is not able to check weight nor BP at home. Patient does mention that he has been drinking way more sodas then normal. He does mention elevating his legs at home, which does seem to help. Patient denies chest pain, sob, swelling in other places (only in ankles). Patient was advised to monitor BP at home, continue to elevate legs, and even use compression stockings over the week. Patient will call back on Monday if symptoms are not any better, or call sooner if other issues arise.   Patient was advised at this time we are not moving patient appointments up, but we are assisting over the phone as we can. Patient was thankful for the call.

## 2018-10-05 NOTE — Telephone Encounter (Signed)
Patient was called and a voicemail was left informing patient to return phone call. 

## 2018-10-05 NOTE — Telephone Encounter (Signed)
Patient returned call please follow up

## 2018-10-05 NOTE — Telephone Encounter (Signed)
-----   Message from Charlott Rakes, MD sent at 10/05/2018  3:01 PM EDT ----- Hi,  Can you please inform this patient? I referred him for a mammogram due to breast concerns.  Thanks, EN. ----- Message ----- From: Armond Hang, LPN Sent: 7/58/3074   9:48 AM EDT To: Charlott Rakes, MD  Good Morning,  I'm writing concerning patient Richard Calhoun. He would not be eligible for [facility] due to being a male patient. We are able to see transgender patients in our program. Not sure what options there would be for him. If you want your staff, to reach out to him and let him know.  Thanks so much,  [place]

## 2018-10-06 ENCOUNTER — Telehealth: Payer: Self-pay | Admitting: Internal Medicine

## 2018-10-06 NOTE — Telephone Encounter (Signed)
New Message        Pt c/o swelling: STAT is pt has developed SOB within 24 hours  1) How much weight have you gained and in what time span? No   2) If swelling, where is the swelling located? Ankles  3) Are you currently taking a fluid pill? No  4) Are you currently SOB? No   5) Do you have a log of your daily weights (if so, list)? No   6) Have you gained 3 pounds in a day or 5 pounds in a week? No   7) Have you traveled recently? No

## 2018-10-06 NOTE — Telephone Encounter (Signed)
Per pt has noted swelling for about 8 days, is worried .Pt states swelling is not as bad in am but as day progresses this worsens yesterday also noticed numbness to left leg from knee down  This is better today Per pt does not add salt and watches salt intake Will  forward to Dr Debara Pickett for review

## 2018-10-09 NOTE — Telephone Encounter (Signed)
Pt aware of recommendations ./cy 

## 2018-10-09 NOTE — Telephone Encounter (Signed)
Avoid salt, elevate leg and monitor. Call us if it worsens.  Dr. Lemmie Evens

## 2018-10-09 NOTE — Telephone Encounter (Signed)
Pt called returning your call 

## 2018-10-09 NOTE — Telephone Encounter (Signed)
LM TO CALL BACK ./CY 

## 2018-10-24 MED FILL — ?METOPROLOL 25 MG TABLET: 25 | 30 days supply | Qty: 60 | Fill #5

## 2018-10-24 MED FILL — ATORVASTATIN 80 MG TABLET: 80 | 30 days supply | Qty: 30 | Fill #5

## 2018-10-24 MED FILL — ?PANTOPRAZOLE SOD DR 40MGTA: 40 | 30 days supply | Qty: 30 | Fill #5

## 2018-10-25 ENCOUNTER — Telehealth: Payer: Self-pay | Admitting: Family Medicine

## 2018-10-25 ENCOUNTER — Telehealth: Payer: Self-pay

## 2018-10-25 MED ORDER — VALSARTAN 160 MG PO TABS: 160.0000 mg | ORAL_TABLET | Freq: Every day | ORAL | 0 refills | Status: DC

## 2018-10-25 MED FILL — LOSARTAN POTASSIUM 100 MG T: 100 | 30 days supply | Qty: 30 | Fill #2

## 2018-10-25 NOTE — Telephone Encounter (Signed)
New Message   Pt states he was suppose to get a referral to have a mammogram but has not received any further information. Please f/u

## 2018-10-25 NOTE — Telephone Encounter (Signed)
F/U Message          Patient is returning Raquel's call. Patient would like a call back.

## 2018-10-25 NOTE — Telephone Encounter (Signed)
Patient was called and informed that he will have to pay out of pocket for mammogram.

## 2018-10-25 NOTE — Telephone Encounter (Signed)
Valsartan 160mg  Rx sent to [facility] to replace losartan 100mg  due to backorder.   Called patient to notify changing in therapy.

## 2018-11-10 ENCOUNTER — Telehealth: Payer: Self-pay

## 2018-11-10 NOTE — Telephone Encounter (Signed)
Called patient to discuss upcoming appointment on 11/14/18 with Dr Debara Pickett.  [email or web address]

## 2018-11-13 ENCOUNTER — Telehealth: Payer: Self-pay | Admitting: Internal Medicine

## 2018-11-13 MED ORDER — NITROGLYCERIN 0.4 MG SL SUBL: 0.4000 mg | SUBLINGUAL_TABLET | SUBLINGUAL | 3 refills | Status: AC | PRN

## 2018-11-13 MED FILL — NITROGLYCERIN 0.4 MG TAB SL: 0.4 | 25 days supply | Qty: 25 | Fill #0

## 2018-11-13 NOTE — Telephone Encounter (Signed)
New Message  Patient returning phone call regarding appointment scheduled.

## 2018-11-13 NOTE — Telephone Encounter (Signed)
Patient is returning your call.  

## 2018-11-13 NOTE — Telephone Encounter (Signed)
Will forward to [facility] T who left patient message to call back

## 2018-11-14 ENCOUNTER — Encounter: Payer: Self-pay | Admitting: Cardiology

## 2018-11-14 ENCOUNTER — Telehealth (INDEPENDENT_AMBULATORY_CARE_PROVIDER_SITE_OTHER): Payer: Medicaid Other | Admitting: Internal Medicine

## 2018-11-14 DIAGNOSIS — H9209 Otalgia, unspecified ear: Secondary | ICD-10-CM

## 2018-11-14 DIAGNOSIS — I10 Essential (primary) hypertension: Secondary | ICD-10-CM

## 2018-11-14 DIAGNOSIS — M7989 Other specified soft tissue disorders: Secondary | ICD-10-CM

## 2018-11-14 DIAGNOSIS — E785 Hyperlipidemia, unspecified: Secondary | ICD-10-CM

## 2018-11-14 DIAGNOSIS — I2510 Atherosclerotic heart disease of native coronary artery without angina pectoris: Secondary | ICD-10-CM

## 2018-11-14 DIAGNOSIS — Z720 Tobacco use: Secondary | ICD-10-CM

## 2018-11-14 DIAGNOSIS — I824Y9 Acute embolism and thrombosis of unspecified deep veins of unspecified proximal lower extremity: Secondary | ICD-10-CM

## 2018-11-14 DIAGNOSIS — Z955 Presence of coronary angioplasty implant and graft: Secondary | ICD-10-CM

## 2018-11-14 MED ORDER — FUROSEMIDE 40 MG PO TABS: 40.0000 mg | ORAL_TABLET | Freq: Every day | ORAL | 3 refills | Status: DC

## 2018-11-14 MED FILL — FUROSEMIDE 40 MG TAB: 40 | 30 days supply | Qty: 30 | Fill #0

## 2018-11-14 NOTE — Progress Notes (Signed)
PCP: Charlott Rakes, MD  Cardiologist: Dr. Debara Pickett  Clinic Note: LE Swelling Chief Complaint  Patient presents with  . Follow-up    Known CAD, no angina  . Edema    HPI: Richard Calhoun is a 58 y.o. male with a PMH of CAD (subtotal CTO of RCA & atherectomy based PCI of the LAD) below who presents today for evaluation of leg swelling in the ankles. Richard Calhoun is  being seen today for the evaluation of  at the request of Dr. Debara Pickett for evaluation of his edema.  Per Dr. Lysbeth Penner note, patient has a 80+ pack year smoking history along with hypertension and hyperlipidemia (apparently he had tried to quit smoking).  Also noted a significant alcohol abuse history (roughly 1/5th of rum daily) --> Initially seen in in summer of 2018 for tachycardia and presyncope  -->-started on metoprolol tartrate 12.5 twice daily ---> Was then hospitalized  Sep-Oct 2018 for unstable angina underwent cardiac catheterization showing ~100% RCA & 90% calcified pLAD --> staged atherectomy based PCI of LAD (DES)  --Hospital stay complicated by alcohol withdrawal symptoms  Richard Calhoun was last seen on April 20, 2018 by Dr. Debara Pickett for follow-up feeling relatively well.  Blood pressure was just on the low side as opposed to usual high side.  And noted little dizziness.  Noted a little bit of intermittent blood in his stool --> Plavix was discontinued, continued on aspirin.  Imdur discontinued.  Recent Hospitalizations: none  Studies Personally Reviewed - (if available, images/films reviewed: From Epic Chart or Care Everywhere)  Transthoracic Echo July 2018: Moderate LVH.  EF 60 to 65%.  No R WMA.  GR 1 DD. Mild AI.   LHC-Coronaray Angiography 03/2017: Severe calcific coronary disease involving: Calcific 99 &100 % CTO of mRCA (L-R collaterals), p-mLAD 60-90% (FFR 0.74 beyond D2), 50% Cx  Staged Orbital Atherectomy-DES PCI p-mLAD 04/20/2017: [employer] atherectomy, PTCA followed by DES PCI (synergy DES 3.0 mm 24 mm--3.3  mm).    Interval History:  [name] called in to talk to triage nurse on March 17 noting that he has been having swelling in his ankles for about 3 days.  Unable to check weight or blood pressure at home.  Did indicate that he had been drinking more sodas than normal.  Noted that elevating his legs at home did seem to help.  No chest pain dyspnea or other edema.  Was told to elevate his legs and even use compression stockings. --> Dr. Debara Pickett recommended avoiding salt, elevating elevating legs and the monitor.  Apparently he had also noted some numbness in his left leg from the knee down.  He was seen yesterday by Dr. Debara Pickett via telemedicine --> restarted on Lasix 40 mg daily, and had lower extremity venous duplexes ordered.  Was scheduled to see DOD this week.  Richard Calhoun presents here today 1 day after visit with Dr. Debara Pickett for reassessment.   He has not been able to get the prescription for Lasix yet.  He says that the left foot and ankle is definitely more swollen than the other.  Is not really red, but he does note some discomfort along the top of his feet and is difficult to put his shoes on his feet.  He does not really necessarily have any leg pain with walking.  Interestingly, he does not have any PND or orthopnea.  He has not had any chest pain or pressure with rest or exertion.  He has been drinking quite a  bit of ginger ale and other sodas lately over the last few weeks.  He was having some upset stomach immensely restricting to get ginger ale.  I could not get a straight answer as to how much alcohol he is still drinking, but he clearly is still drinking alcohol now as well.   No palpitations, lightheadedness, dizziness, weakness or syncope/near syncope. No TIA/amaurosis fugax symptoms. No melena, hematochezia, hematuria, or epstaxis.  I talked to the ultrasound tech and reviewed the images of his leg ultrasound, the does not appear to be any evidence of DVT or significant large vessel venous  insufficiency, however there was some mild evidence of arterial plaquing.  The patient does not have symptoms concerning for COVID-19 infection (fever, chills, cough, or new shortness of breath).  The patient is practicing social distancing.  Wearing a mask today.  Wears a mask if he has to go out.  Washes hands.  ROS: A comprehensive was performed. Review of Systems  Constitutional: Negative for chills, fever and malaise/fatigue (Just has not been feeling well for the last week).  HENT: Positive for congestion (Allergies). Negative for nosebleeds.   Respiratory: Negative for cough, shortness of breath and wheezing.   Cardiovascular: Positive for leg swelling (Per HPI both legs, but left seems a little more than the right.).  Gastrointestinal: Positive for heartburn and nausea (This is why he was drinking a ginger ale). Negative for blood in stool, constipation, diarrhea and melena.  Genitourinary: Negative for hematuria.  Musculoskeletal: Positive for joint pain (Left ankle more than right.) and myalgias.  Neurological: Positive for dizziness.  Endo/Heme/Allergies: Positive for environmental allergies.  Psychiatric/Behavioral: Negative for memory loss. The patient is not nervous/anxious and does not have insomnia.   All other systems reviewed and are negative.  I have reviewed and (if needed) personally updated the patient's problem list, medications, allergies, past medical and surgical history, social and family history.   Past Medical History:  Diagnosis Date  . ALLERGIC RHINITIS   . ANXIETY   . BICEPS TENDON RUPTURE, RIGHT   . CAD S/P percutaneous coronary angioplasty 03/2017   Cath: mRCA 99-100% CTO (calcific w/ L-R collaterals), p-mLAD calcified 60-80% (FFR 0.74 after D2), 50% mCx. --> Staged Atherectomy-DES PCI of p-mLAD (Synergy DES 3.0 x 24 -- 3.3 mm)  . DEPENDENCE, ALCOHOL NEC/NOS, UNSPECIFIED   . DEPRESSION   . GERD   . HYPERLIPIDEMIA   . HYPERTENSION   . Impaired glucose  tolerance   . OSTEOARTHRITIS   . Tubular adenoma of colon 06/2006    Past Surgical History:  Procedure Laterality Date  . CORONARY ATHERECTOMY N/A 04/20/2017   Procedure: CORONARY ATHERECTOMY;  Surgeon: Wellington Hampshire, MD;  Location: [facility];  Service: Cardiovascular:: p-mLAD (Orbital)  . CORONARY STENT INTERVENTION N/A 04/20/2017   Procedure: CORONARY STENT INTERVENTION;  Surgeon: Wellington Hampshire, MD;  Location: [facility];  Service: Cardiovascular:: Staged Orbital Atherectomy-DES PCI of p-mLAD (Synergy DES 3.0 x 24 -- 3.3 mm)  . FOOT SURGERY    . INTRAVASCULAR PRESSURE WIRE/FFR STUDY N/A 04/15/2017   Procedure: INTRAVASCULAR PRESSURE WIRE/FFR STUDY;  Surgeon: Belva Crome, MD;  Location: [facility];  Service: Cardiovascular;  LAD = 0.74 after D2  . LEFT HEART CATH AND CORONARY ANGIOGRAPHY N/A 04/15/2017   Procedure: LEFT HEART CATH AND CORONARY ANGIOGRAPHY;  Surgeon: Belva Crome, MD;  Location: [facility];  Service: Cardiovascular;:: mRCA 99-100% CTO (calcific w/ L-R collaterals), p-mLAD calcified 60-80% (  FFR 0.74 after D2), 50% mCx. --> staged PCI LAD  . MANDIBLE FRACTURE SURGERY    . TRANSTHORACIC ECHOCARDIOGRAM  01/2017   Moderate LVH.  EF 60 to 65%.  No R WMA.  GR 1 DD. Mild AI.     Current Meds  Medication Sig  . aspirin EC 81 MG tablet Take 81 mg by mouth every other day.   Marland Kitchen atorvastatin (LIPITOR) 80 MG tablet Take 1 tablet (80 mg total) by mouth daily at 6 PM.  . furosemide (LASIX) 40 MG tablet Take 1 tablet (40 mg total) by mouth daily.  Marland Kitchen losartan (COZAAR) 100 MG tablet Take 100 mg by mouth daily.  . metoprolol tartrate (LOPRESSOR) 25 MG tablet Take 1 tablet (25 mg total) by mouth 2 (two) times daily.  . Multiple Vitamins-Minerals (CENTRUM) tablet Take 1 tablet by mouth daily.   . nitroGLYCERIN (NITROSTAT) 0.4 MG SL tablet Place 1 tablet (0.4 mg total) under the tongue every 5 (five) minutes as needed for chest pain.  Marland Kitchen omeprazole  (PRILOSEC OTC) 20 MG tablet Take 20 mg by mouth daily.  . vitamin C (ASCORBIC ACID) 500 MG tablet Take 500 mg by mouth daily.  --Has been converted from losartan 100 mg to valsartan 160 mg   Allergies  Allergen Reactions  . Penicillins Other (See Comments)    Childhood reaction Has patient had a PCN reaction causing immediate rash, facial/tongue/throat swelling, SOB or lightheadedness with hypotension: Unknown Has patient had a PCN reaction causing severe rash involving mucus membranes or skin necrosis: Unknown Has patient had a PCN reaction that required hospitalization: No Has patient had a PCN reaction occurring within the last 10 years: No If all of the above answers are "NO", then may proceed with Cephalosporin use.     Social History   Tobacco Use  . Smoking status: Light Tobacco Smoker    Types: E-cigarettes    Last attempt to quit: 2017    Years since quitting: 3.3  . Smokeless tobacco: Never Used  . Tobacco comment: states he is smoking 1 pack 1/2 now that he is unemployed   Substance Use Topics  . Alcohol use: Yes    Alcohol/week: 0.0 standard drinks  . Drug use: No   Social History   Social History Narrative  . Not on file    family history includes Kidney disease in his sister; Stroke in his father and mother.  Wt Readings from Last 3 Encounters:  11/15/18 209 lb (94.8 kg)  08/02/18 212 lb (96.2 kg)  06/29/18 207 lb (93.9 kg)    PHYSICAL EXAM BP 120/69   Pulse 89   Ht 5\' 7"  (1.702 m)   Wt 209 lb (94.8 kg)   SpO2 95%   BMI 32.73 kg/m  Physical Exam  Constitutional: He is oriented to person, place, and time. He appears well-developed and well-nourished. No distress.  Appears little bit older than stated age.  Well-groomed  HENT:  Head: Normocephalic and atraumatic.  Neck: Normal range of motion. Neck supple. No hepatojugular reflux and no JVD present. Carotid bruit is not present.  Cardiovascular: Normal rate, regular rhythm, S1 normal, S2 normal  and intact distal pulses.  No extrasystoles are present. PMI is not displaced. Exam reveals distant heart sounds and decreased pulses (Pedal pulses do feel somewhat diminished, cannot tell if this is related to PAD or edema.). Exam reveals no gallop and no friction rub.  No murmur heard. Pulmonary/Chest: Effort normal and breath sounds normal. No respiratory  distress. He has no wheezes. He has no rales.  Abdominal: Soft. Bowel sounds are normal. He exhibits no distension. There is no abdominal tenderness. There is no rebound.  Mild truncal obesity  Musculoskeletal: Normal range of motion.        General: Edema (1.5+  left and 1+ right.) present.  Lymphadenopathy:    He has no cervical adenopathy.  Neurological: He is alert and oriented to person, place, and time.  Skin: Skin is warm and dry. No erythema.  He does have lots of spider nevi and small varicose veins noted in the bilateral ankles left worse than right.  Psychiatric: He has a normal mood and affect. His behavior is normal. Judgment and thought content normal.  Vitals reviewed.    Adult ECG Report  Other studies Reviewed: Additional studies/ records that were reviewed today include:  Recent Labs:   Lab Results  Component Value Date   CREATININE 1.03 08/26/2017   BUN 20 08/26/2017   NA 146 (H) 08/26/2017   K 4.1 08/26/2017   CL 107 (H) 08/26/2017   CO2 19 (L) 08/26/2017    ASSESSMENT / PLAN: Problem List Items Addressed This Visit    Coronary artery disease involving native coronary artery of native heart without angina pectoris (Chronic)    No further angina.  Trying to stay active.  Is on aspirinalong with high-dose atorvastatin, ARB and beta-blocker. No longer on Thienopyridine.      Decreased pulses in feet    I think the decreased pulses are probably related to edema, however with him having pain that can be made worse with walking, we will order ABIs and potential lower extremity arterial Dopplers.       Relevant Orders   VAS [place] ABI WITH/WO TBI   Hyperlipidemia LDL goal <70 (Chronic)    On high-dose statin.  Monitor by PCP and Dr. 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  I think he is taking valsartan now as opposed to losartan but cannot be sure.      Localized swelling of both lower legs - Primary    Left leg deftly seems to be swelling with it more than the right and is having more discomfort. Venous Dopplers did not show any evidence of DVT or significant venous stasis.  He does have visible exam findings of venous stasis however. Plan: Start taking furosemide 20 mg daily until swelling goes down.  Should take 40 mg first day. Recommend using support stockings/compression stockings (wife is familiar with these as she has never self).  Also recommend foot elevation and lower leg exercises during the course the day.      Pain in both lower extremities    Edema is not all that bad.  I think by giving her low-dose Lasix for diuresis and using support stockings, this should improve.  We are going to check ABIs just to exclude any significant PAD.      Relevant Orders   VAS [place] ABI WITH/WO TBI   S/P drug eluting coronary stent placement (Chronic)    PCI back in September 2018 which was staged atherectomy.  Dr. Debara Pickett has stopped Thienopyridine and continuing aspirin         COVID-19 Education: The signs and symptoms of COVID-19 were discussed with the patient and how to seek care for testing (follow up with PCP or arrange E-visit).  The importance of social distancing was discussed today.  I spent a total of ~20 minutes with the patient and an additional 10-15 minutes on chart review. >  50% of the time was spent in direct patient consultation.   Current medicines are reviewed at length with the patient today.  (+/- concerns) has not been gotten the Lasix prescription  filled The following changes have been made:  Start Lasix ordered by Dr. Debara Pickett along with support stockings and foot elevation.  Patient Instructions  Medication Instructions:  Use lasix ( furosemide) take daily until swelling goes done the use as needed  If you need a refill on your cardiac medications before your next appointment, please call your pharmacy.   Lab work: Not needed  If you have labs (blood work) drawn today and your tests are completely normal, you will receive your results only by: Marland Kitchen MyChart Message (if you have MyChart) OR . A paper copy in the mail If you have any lab test that is abnormal or we need to change your treatment, we will call you to review the results.  Testing/Procedures: will be schedule in 2-3 months at [facility] has requested that you have an ankle brachial index (ABI). During this test an ultrasound and blood pressure cuff are used to evaluate the arteries that supply the arms and legs with blood. Allow thirty minutes for this exam. There are no restrictions or special instructions.   Follow-Up: At [facility], you and your health needs are our priority.  As part of our continuing mission to provide you with exceptional heart care, we have created designated Provider Care Teams.  These Care Teams include your primary Cardiologist (physician) and Advanced Practice Providers (APPs -  Physician Assistants and Nurse Practitioners) who all work together to provide you with the care you need, when you need it. You will need a follow up appointment in 6 weeks with either Dr Debara Pickett or extender.  Please call our office 2 months in advance to schedule this appointment.  You may see Pixie Casino, MD or one of the following Advanced Practice Providers on your designated Care Team: [place], [place] . Fabian Sharp, PA-C  Any Other Special Instructions Will Be Listed Below (If Applicable).  Keep feet elevated as much as  possible  Wear some knee hi compression stocking . Do foot exercises    Studies Ordered:   No orders of the defined types were placed in this encounter.     Glenetta Hew, M.D., M.S. Interventional Cardiologist   Pager # [phone] Phone # [phone] [address] [place], [place] [address]   Thank you for choosing [facility] at [facility]!!

## 2018-11-14 NOTE — Patient Instructions (Signed)
Medication Instructions:  Start: Lasix 40 mg daily  If you need a refill on your cardiac medications before your next appointment, please call your pharmacy.   Lab work: None  Testing/Procedures: Your physician has requested that you have a lower extremity venous duplex. This test is an ultrasound of the veins in the legs or arms. It looks at venous blood flow that carries blood from the heart to the legs or arms. Allow one hour for a Lower Venous exam. Allow thirty minutes for an Upper Venous exam. There are no restrictions or special instructions. [place]  Follow-Up: Your physician recommends that you schedule a follow-up appointment this week with Doctor of the Day.

## 2018-11-15 ENCOUNTER — Encounter: Payer: Self-pay | Admitting: Cardiology

## 2018-11-15 ENCOUNTER — Encounter (HOSPITAL_COMMUNITY): Payer: Self-pay

## 2018-11-15 ENCOUNTER — Telehealth: Payer: Self-pay | Admitting: *Deleted

## 2018-11-15 ENCOUNTER — Ambulatory Visit (HOSPITAL_COMMUNITY)
Admission: RE | Admit: 2018-11-15 | Discharge: 2018-11-15 | Disposition: A | Discharge status: Home / Self Care | Payer: Self-pay | Source: Ambulatory Visit | Attending: Internal Medicine | Admitting: Internal Medicine

## 2018-11-15 ENCOUNTER — Other Ambulatory Visit: Payer: Self-pay

## 2018-11-15 ENCOUNTER — Ambulatory Visit (INDEPENDENT_AMBULATORY_CARE_PROVIDER_SITE_OTHER): Payer: Self-pay | Admitting: Cardiology

## 2018-11-15 ENCOUNTER — Encounter: Payer: Self-pay | Admitting: Internal Medicine

## 2018-11-15 VITALS — BP 120/69 | HR 89 | Ht 67.0 in | Wt 209.0 lb

## 2018-11-15 DIAGNOSIS — I2510 Atherosclerotic heart disease of native coronary artery without angina pectoris: Secondary | ICD-10-CM

## 2018-11-15 DIAGNOSIS — E785 Hyperlipidemia, unspecified: Secondary | ICD-10-CM

## 2018-11-15 DIAGNOSIS — M79605 Pain in left leg: Secondary | ICD-10-CM

## 2018-11-15 DIAGNOSIS — I824Y2 Acute embolism and thrombosis of unspecified deep veins of left proximal lower extremity: Secondary | ICD-10-CM

## 2018-11-15 DIAGNOSIS — M79604 Pain in right leg: Secondary | ICD-10-CM

## 2018-11-15 DIAGNOSIS — R0989 Other specified symptoms and signs involving the circulatory and respiratory systems: Secondary | ICD-10-CM

## 2018-11-15 DIAGNOSIS — I119 Hypertensive heart disease without heart failure: Secondary | ICD-10-CM

## 2018-11-15 DIAGNOSIS — R2243 Localized swelling, mass and lump, lower limb, bilateral: Secondary | ICD-10-CM

## 2018-11-15 DIAGNOSIS — I824Y9 Acute embolism and thrombosis of unspecified deep veins of unspecified proximal lower extremity: Secondary | ICD-10-CM | POA: Insufficient documentation

## 2018-11-15 DIAGNOSIS — Z955 Presence of coronary angioplasty implant and graft: Secondary | ICD-10-CM

## 2018-11-15 NOTE — Progress Notes (Signed)
Virtual Visit via Telephone Note   This visit type was conducted due to national recommendations for restrictions regarding the COVID-19 Pandemic (e.g. social distancing) in an effort to limit this patient's exposure and mitigate transmission in our community.  Due to his co-morbid illnesses, this patient is at least at moderate risk for complications without adequate follow up.  This format is felt to be most appropriate for this patient at this time.  The patient did not have access to video technology/had technical difficulties with video requiring transitioning to audio format only (telephone).  All issues noted in this document were discussed and addressed.  No physical exam could be performed with this format.  Please refer to the patient's chart for his  consent to telehealth for [facility].   Evaluation Performed:  Telephone visit  Date:  11/15/2018   ID:  Richard Calhoun, DOB Apr 18, 1961, MRN [id number]  Patient Location:  [address] [place] [address]  Provider location:   [address] [place], [place] [address]  PCP:  Charlott Rakes, MD  Cardiologist:  Pixie Casino, MD Electrophysiologist:  None   Chief Complaint:  Leg swelling, pain, earache  History of Present Illness:    Richard Calhoun is a 58 y.o. male who presents via audio/video conferencing for a telehealth visit today.  Richard Calhoun is a 58 year old male with a history of coronary artery disease and occluded RCA with collaterals at 90% LAD stenosis status post drug-eluting stent in October 2018.  He also has a history of alcohol dependence which is on and off as well as smoking.  He had quit smoking and started vaping however recently restarted smoking during the pandemic.  He also has hypertension, dyslipidemia and hypertensive heart disease.  Over the past several weeks he has had worsening lower extremity edema as well as pain in his legs particularly in the left leg which is notably  more swollen than the right leg.  He says he gets numbness and tingling in his legs and cannot get his shoes on.  He has not been on a diuretic.  In addition today he is concerned about an earache in his left ear.  He says he has had pain that hurts worse when he chews or moves his jaw.  He is asking for an antibiotic for this.  Unfortunate this is a telephone visit is he did not have any video capability.  I told him he would need to have an evaluation of his ear however he said he cannot get into his primary care physician.  With regard to the leg swelling it seems like this is acute on chronic but certainly worse on the left side.  I am concerned about a possible DVT versus venous insufficiency or diastolic heart failure.  LVEF by cath in 2018 was greater than 55%.  The patient does not have symptoms concerning for COVID-19 infection (fever, chills, cough, or new SHORTNESS OF BREATH).    Prior CV studies:   The following studies were reviewed today:  Previous cath, venous Dopplers Telephone notes  PMHx:  Past Medical History:  Diagnosis Date  . ALLERGIC RHINITIS   . ANXIETY   . BICEPS TENDON RUPTURE, RIGHT   . CAD S/P percutaneous coronary angioplasty 03/2017   Cath: mRCA 99-100% CTO (calcific w/ L-R collaterals), p-mLAD calcified 60-80% (FFR 0.74 after D2), 50% mCx. --> Staged Atherectomy-DES PCI of p-mLAD (Synergy DES 3.0 x 24 -- 3.3 mm)  . DEPENDENCE,  ALCOHOL NEC/NOS, UNSPECIFIED   . DEPRESSION   . GERD   . HYPERLIPIDEMIA   . HYPERTENSION   . Impaired glucose tolerance   . OSTEOARTHRITIS   . Tubular adenoma of colon 06/2006    Past Surgical History:  Procedure Laterality Date  . CORONARY ATHERECTOMY N/A 04/20/2017   Procedure: CORONARY ATHERECTOMY;  Surgeon: Wellington Hampshire, MD;  Location: [facility];  Service: Cardiovascular:: p-mLAD (Orbital)  . CORONARY STENT INTERVENTION N/A 04/20/2017   Procedure: CORONARY STENT INTERVENTION;  Surgeon: Wellington Hampshire, MD;   Location: [facility];  Service: Cardiovascular:: Staged Orbital Atherectomy-DES PCI of p-mLAD (Synergy DES 3.0 x 24 -- 3.3 mm)  . FOOT SURGERY    . INTRAVASCULAR PRESSURE WIRE/FFR STUDY N/A 04/15/2017   Procedure: INTRAVASCULAR PRESSURE WIRE/FFR STUDY;  Surgeon: Belva Crome, MD;  Location: [facility];  Service: Cardiovascular;  LAD = 0.74 after D2  . LEFT HEART CATH AND CORONARY ANGIOGRAPHY N/A 04/15/2017   Procedure: LEFT HEART CATH AND CORONARY ANGIOGRAPHY;  Surgeon: Belva Crome, MD;  Location: [facility];  Service: Cardiovascular;:: mRCA 99-100% CTO (calcific w/ L-R collaterals), p-mLAD calcified 60-80% (FFR 0.74 after D2), 50% mCx. --> staged PCI LAD  . MANDIBLE FRACTURE SURGERY    . TRANSTHORACIC ECHOCARDIOGRAM  01/2017   Moderate LVH.  EF 60 to 65%.  No R WMA.  GR 1 DD. Mild AI.     FAMHx:  Family History  Problem Relation Age of Onset  . Kidney disease Sister   . Stroke Mother   . Stroke Father     SOCHx:   reports that he has been smoking e-cigarettes. He has never used smokeless tobacco. He reports current alcohol use. He reports that he does not use drugs.  ALLERGIES:  Allergies  Allergen Reactions  . Penicillins Other (See Comments)    Childhood reaction Has patient had a PCN reaction causing immediate rash, facial/tongue/throat swelling, SOB or lightheadedness with hypotension: Unknown Has patient had a PCN reaction causing severe rash involving mucus membranes or skin necrosis: Unknown Has patient had a PCN reaction that required hospitalization: No Has patient had a PCN reaction occurring within the last 10 years: No If all of the above answers are "NO", then may proceed with Cephalosporin use.     MEDS:  Current Meds  Medication Sig  . aspirin EC 81 MG tablet Take 81 mg by mouth every other day.   Marland Kitchen atorvastatin (LIPITOR) 80 MG tablet Take 1 tablet (80 mg total) by mouth daily at 6 PM.  . losartan (COZAAR) 100 MG tablet Take 100 mg  by mouth daily.  . metoprolol tartrate (LOPRESSOR) 25 MG tablet Take 1 tablet (25 mg total) by mouth 2 (two) times daily.  . Multiple Vitamins-Minerals (CENTRUM) tablet Take 1 tablet by mouth daily.   . nitroGLYCERIN (NITROSTAT) 0.4 MG SL tablet Place 1 tablet (0.4 mg total) under the tongue every 5 (five) minutes as needed for chest pain.  Marland Kitchen omeprazole (PRILOSEC OTC) 20 MG tablet Take 20 mg by mouth daily.  . vitamin C (ASCORBIC ACID) 500 MG tablet Take 500 mg by mouth daily.     ROS: Pertinent items noted in HPI and remainder of comprehensive ROS otherwise negative.  Labs/Other Tests and Data Reviewed:    Recent Labs: No results found for requested labs within last 8760 hours.   Recent Lipid Panel Lab Results  Component Value Date/Time   CHOL 168 04/20/2018 11:09 AM  TRIG 430 (H) 04/20/2018 11:09 AM   TRIG 70 07/18/2006 01:01 PM   HDL 44 04/20/2018 11:09 AM   CHOLHDL 3.8 04/20/2018 11:09 AM   CHOLHDL 3.0 04/15/2017 02:34 AM   LDLCALC Comment 04/20/2018 11:09 AM   LDLDIRECT 165 (H) 08/16/2013 05:32 PM    Wt Readings from Last 3 Encounters:  08/02/18 212 lb (96.2 kg)  06/29/18 207 lb (93.9 kg)  06/09/18 209 lb (94.8 kg)     Exam:    Vital Signs:  There were no vitals taken for this visit.   Exam not performed due to telephone visit  ASSESSMENT & PLAN:    1. Lower extremity edema worse on the left with associated pain and tingling 2. Left earache 3. Coronary artery disease with an occluded RCA collateralized and a 90% LAD stenosis status post DES (04/2017) 4. Hypertensive heart disease 5. Hypertension 6. Dyslipidemia 7. Alcohol dependence - recently quit 8. Former smoker (now [employer])  Mr. Bouknight has a number of concerns today.  He is describing worsening lower extremity edema to the point where he has discomfort, numbness and tingling and swelling worse in the left lower extremity.  He has a history of normal LV function.  This could be diastolic heart failure or  perhaps venous insufficiency.  I could also not rule out DVT.  He denies any warmth or redness of the lower extremity.  It is difficult since I cannot physically examine it by telephone visit.  In addition he is reporting an earache.  He wants to have that examined.  I defer this to his PCP however he said he could not take off any more time to go see his PCP and potentially go to the office to get an ultrasound to rule out DVT.  I mention we may be able to evaluate his ear when he is seen for his DVT exam, otherwise he would need to contact his PCP regarding this.  I will plan to start Lasix 40 mg daily.  He will likely need lab work in 2 to 4 weeks to check his renal function and a follow-up to see if his edema is improving.  Notably he has had about 5 pound weight gain over the past several months.  COVID-19 Education: The signs and symptoms of COVID-19 were discussed with the patient and how to seek care for testing (follow up with PCP or arrange E-visit).  The importance of social distancing was discussed today.  Patient Risk:   After full review of this patients clinical status, I feel that they are at least moderate risk at this time.  Time:   Today, I have spent 25 minutes with the patient with telehealth technology discussing leg edema, ear pain.     Medication Adjustments/Labs and Tests Ordered: Current medicines are reviewed at length with the patient today.  Concerns regarding medicines are outlined above.   Tests Ordered: No orders of the defined types were placed in this encounter.   Medication Changes: Meds ordered this encounter  Medications  . furosemide (LASIX) 40 MG tablet    Sig: Take 1 tablet (40 mg total) by mouth daily.    Dispense:  90 tablet    Refill:  3    Disposition:  Needs office visit for follow-up this week as well as US of the LLE to r/o DVT  Pixie Casino, MD, [facility], [place] Director of the Advanced Lipid  Disorders &  Cardiovascular Risk  Reduction Clinic Diplomate of the American Board of Clinical Lipidology Attending Cardiologist  Direct Dial: [phone]  Fax: [phone]  Website:  [email or web address]  Pixie Casino, MD  11/15/2018 10:11 AM

## 2018-11-15 NOTE — Telephone Encounter (Signed)
Left message for patient to call and schedule ABI's and 6 week telehealth visit with Dr. Hilty 

## 2018-11-15 NOTE — Patient Instructions (Addendum)
Medication Instructions:  Use lasix ( furosemide) take daily until swelling goes done the use as needed  If you need a refill on your cardiac medications before your next appointment, please call your pharmacy.   Lab work: Not needed  If you have labs (blood work) drawn today and your tests are completely normal, you will receive your results only by: Marland Kitchen MyChart Message (if you have MyChart) OR . A paper copy in the mail If you have any lab test that is abnormal or we need to change your treatment, we will call you to review the results.  Testing/Procedures: will be schedule in 2-3 months at [facility] has requested that you have an ankle brachial index (ABI). During this test an ultrasound and blood pressure cuff are used to evaluate the arteries that supply the arms and legs with blood. Allow thirty minutes for this exam. There are no restrictions or special instructions.   Follow-Up: At [facility], you and your health needs are our priority.  As part of our continuing mission to provide you with exceptional heart care, we have created designated Provider Care Teams.  These Care Teams include your primary Cardiologist (physician) and Advanced Practice Providers (APPs -  Physician Assistants and Nurse Practitioners) who all work together to provide you with the care you need, when you need it. You will need a follow up appointment in 6 weeks with either Dr Debara Pickett or extender.  Please call our office 2 months in advance to schedule this appointment.  You may see Pixie Casino, MD or one of the following Advanced Practice Providers on your designated Care Team: [place], [place] . Fabian Sharp, PA-C  Any Other Special Instructions Will Be Listed Below (If Applicable).  Keep feet elevated as much as possible  Wear some knee hi compression stocking . Do foot exercises

## 2018-11-15 NOTE — Progress Notes (Unsigned)
The left lower venous has been completed and is negative for DVT. Preliminary results can be found under CV proc through chart review.  Wilkie Aye RVT [facility]

## 2018-11-16 ENCOUNTER — Encounter: Payer: Self-pay | Admitting: Cardiology

## 2018-11-16 DIAGNOSIS — M79605 Pain in left leg: Secondary | ICD-10-CM

## 2018-11-16 DIAGNOSIS — R2243 Localized swelling, mass and lump, lower limb, bilateral: Secondary | ICD-10-CM | POA: Insufficient documentation

## 2018-11-16 DIAGNOSIS — M79604 Pain in right leg: Secondary | ICD-10-CM | POA: Insufficient documentation

## 2018-11-16 DIAGNOSIS — R0989 Other specified symptoms and signs involving the circulatory and respiratory systems: Secondary | ICD-10-CM | POA: Insufficient documentation

## 2018-11-16 NOTE — Assessment & Plan Note (Signed)
No further angina.  Trying to stay active.  Is on aspirinalong with high-dose atorvastatin, ARB and beta-blocker. No longer on Thienopyridine.

## 2018-11-16 NOTE — Telephone Encounter (Signed)
Left message for patient to call ans schedule ABI's and 6 week telehealth visit with Dr. Debara Pickett

## 2018-11-16 NOTE — Assessment & Plan Note (Signed)
I think the decreased pulses are probably related to edema, however with him having pain that can be made worse with walking, we will order ABIs and potential lower extremity arterial Dopplers.

## 2018-11-16 NOTE — Assessment & Plan Note (Signed)
On high-dose statin.  Monitor by PCP and Dr. Debara Pickett

## 2018-11-16 NOTE — Assessment & Plan Note (Signed)
Left leg deftly seems to be swelling with it more than the right and is having more discomfort. Venous Dopplers did not show any evidence of DVT or significant venous stasis.  He does have visible exam findings of venous stasis however. Plan: Start taking furosemide 20 mg daily until swelling goes down.  Should take 40 mg first day. Recommend using support stockings/compression stockings (wife is familiar with these as she has never self).  Also recommend foot elevation and lower leg exercises during the course the day.

## 2018-11-16 NOTE — Assessment & Plan Note (Addendum)
PCI back in September 2018 which was staged atherectomy.  Dr. Debara Pickett has stopped Thienopyridine and continuing aspirin

## 2018-11-16 NOTE — Assessment & Plan Note (Signed)
Edema is not all that bad.  I think by giving her low-dose Lasix for diuresis and using support stockings, this should improve.  We are going to check ABIs just to exclude any significant PAD.

## 2018-11-16 NOTE — Assessment & Plan Note (Signed)
Blood pressure pretty well controlled today on ARB and low-dose beta-blocker.  I think he is taking valsartan now as opposed to losartan but cannot be sure.

## 2018-11-17 ENCOUNTER — Ambulatory Visit: Payer: Self-pay | Attending: Internal Medicine | Admitting: Internal Medicine

## 2018-11-17 ENCOUNTER — Other Ambulatory Visit: Payer: Self-pay

## 2018-11-17 ENCOUNTER — Other Ambulatory Visit: Payer: Self-pay | Admitting: Physician Assistant

## 2018-11-17 DIAGNOSIS — B353 Tinea pedis: Secondary | ICD-10-CM

## 2018-11-17 DIAGNOSIS — H9312 Tinnitus, left ear: Secondary | ICD-10-CM

## 2018-11-17 DIAGNOSIS — H6690 Otitis media, unspecified, unspecified ear: Secondary | ICD-10-CM

## 2018-11-17 MED ORDER — DOXYCYCLINE HYCLATE 100 MG PO TABS: 100.0000 mg | ORAL_TABLET | Freq: Two times a day (BID) | ORAL | 0 refills | Status: DC

## 2018-11-17 MED FILL — ?DOXYCYCLINE HYCLATE 100MG: 100 | 7 days supply | Qty: 14 | Fill #0

## 2018-11-17 NOTE — Progress Notes (Signed)
Pt states he has a ringing in his left ear  Pt states he use to get ear infections all the time

## 2018-11-17 NOTE — Progress Notes (Signed)
Virtual Visit via Telephone Note  I connected with Richard Calhoun on 11/17/18 at 2:51 pm EDT by telephone  from my office and verified that I am speaking with the correct person using two identifiers.  Patient is at home.  Only the patient and myself participated in this encounter.   I discussed the limitations, risks, security and privacy concerns of performing an evaluation and management service by telephone and the availability of in person appointments. I also discussed with the patient that there may be a patient responsible charge related to this service. The patient expressed understanding and agreed to proceed.   History of Present Illness: Patient with history of HTN, HL, CAD  Pt thinks he has an ear infection in LT ear. -symptoms: throbbing in the ear x 1 wk. Does not hurt when he pulls on ear.  No drainage.  Uses Q-tip to clean ears.  Did not allow water to get down in ear.  Also has ringing in the ear x 1 mth.  No decrease hearing. Does not work in loud environment or use loud machinery.    -states he use to get frequent ear infections but none in past 1-2 yrs with similar symptoms.    Observations/Objective: No direct observation done as this was a telephone encounter  Assessment and Plan: 1. Acute otitis media, unspecified otitis media type We will treat him as an acute otitis.  He is allergic to penicillin so we will use doxycycline.  Patient advised to follow-up with PCP if ear pain does not resolve with the antibiotics. - doxycycline (VIBRA-TABS) 100 MG tablet; Take 1 tablet (100 mg total) by mouth 2 (two) times daily.  Dispense: 14 tablet; Refill: 0  2. Tinnitus of left ear Discussed diagnosis and range of causes and the fact that this is sometimes a menacing problem without good solutions.  If no improvement in this symptom with completion of his antibiotics, would likely benefit from referral to ENT   Follow Up Instructions: PRN   I discussed the assessment and  treatment plan with the patient. The patient was provided an opportunity to ask questions and all were answered. The patient agreed with the plan and demonstrated an understanding of the instructions.   The patient was advised to call back or seek an in-person evaluation if the symptoms worsen or if the condition fails to improve as anticipated.  I provided 10 minutes of non-face-to-face time during this encounter.   Karle Plumber, MD

## 2018-11-22 ENCOUNTER — Other Ambulatory Visit: Payer: Self-pay | Admitting: Cardiology

## 2018-11-22 DIAGNOSIS — M79604 Pain in right leg: Secondary | ICD-10-CM

## 2018-11-22 DIAGNOSIS — R0989 Other specified symptoms and signs involving the circulatory and respiratory systems: Secondary | ICD-10-CM

## 2018-11-22 DIAGNOSIS — M79605 Pain in left leg: Secondary | ICD-10-CM

## 2018-11-23 ENCOUNTER — Telehealth: Payer: Self-pay | Admitting: Family Medicine

## 2018-11-23 NOTE — Telephone Encounter (Signed)
New Message  1) Medication(s) Requested (by name): clotrimazole-betamethasone  2) Pharmacy of Choice: [facility]  3) Special Requests:   Approved medications will be sent to the pharmacy, we will reach out if there is an issue.  Requests made after 3pm may not be addressed until the following business day!  If a patient is unsure of the name of the medication(s) please note and ask patient to call back when they are able to provide all info, do not send to responsible party until all information is available!

## 2018-11-24 MED ORDER — CLOTRIMAZOLE-BETAMETHASONE 1-0.05 % EX CREA: 1.0000 "application " | TOPICAL_CREAM | Freq: Two times a day (BID) | CUTANEOUS | 1 refills | Status: DC

## 2018-11-24 MED FILL — CLOTRIMAZOLE-BETAMETHASONE: 1-0.05 | 15 days supply | Qty: 30 | Fill #0

## 2018-11-24 NOTE — Telephone Encounter (Signed)
Done

## 2018-11-28 MED FILL — ?PANTOPRAZOLE SOD DR 40MGTA: 40 | 30 days supply | Qty: 30 | Fill #6

## 2018-11-28 MED FILL — ATORVASTATIN 80 MG TABLET: 80 | 30 days supply | Qty: 30 | Fill #6

## 2018-11-28 MED FILL — ?METOPROLOL 25 MG TABLET: 25 | 30 days supply | Qty: 60 | Fill #6

## 2018-11-28 MED FILL — LOSARTAN POTASSIUM 100 MG T: 100 | 30 days supply | Qty: 30 | Fill #3

## 2018-12-13 ENCOUNTER — Encounter (HOSPITAL_COMMUNITY): Payer: Medicaid Other

## 2018-12-22 ENCOUNTER — Encounter (HOSPITAL_COMMUNITY): Payer: Medicaid Other

## 2018-12-27 ENCOUNTER — Other Ambulatory Visit: Payer: Self-pay | Admitting: Family Medicine

## 2018-12-27 ENCOUNTER — Telehealth: Payer: Self-pay | Admitting: Family Medicine

## 2018-12-27 DIAGNOSIS — I10 Essential (primary) hypertension: Secondary | ICD-10-CM

## 2018-12-27 DIAGNOSIS — K219 Gastro-esophageal reflux disease without esophagitis: Secondary | ICD-10-CM

## 2018-12-27 DIAGNOSIS — I25119 Atherosclerotic heart disease of native coronary artery with unspecified angina pectoris: Secondary | ICD-10-CM

## 2018-12-27 NOTE — Telephone Encounter (Signed)
1) Medication(s) Requested (by name): Omeprazole Metoprolol Atorvastatin Losartan Furosemide  2) Pharmacy of Choice: [facility] 3) Special Requests:   Approved medications will be sent to the pharmacy, we will reach out if there is an issue.  Requests made after 3pm may not be addressed until the following business day!  If a patient is unsure of the name of the medication(s) please note and ask patient to call back when they are able to provide all info, do not send to responsible party until all information is available!

## 2018-12-28 MED ORDER — ATORVASTATIN CALCIUM 80 MG PO TABS: 80.0000 mg | ORAL_TABLET | Freq: Every day | ORAL | 2 refills | Status: AC

## 2018-12-28 MED ORDER — METOPROLOL TARTRATE 25 MG PO TABS: 25.0000 mg | ORAL_TABLET | Freq: Two times a day (BID) | ORAL | 2 refills | Status: DC

## 2018-12-28 MED ORDER — LOSARTAN POTASSIUM 100 MG PO TABS: 100.0000 mg | ORAL_TABLET | Freq: Every day | ORAL | 1 refills | Status: DC

## 2018-12-28 MED ORDER — OMEPRAZOLE MAGNESIUM 20 MG PO TBEC: 20.0000 mg | DELAYED_RELEASE_TABLET | Freq: Every day | ORAL | 2 refills | Status: AC

## 2018-12-28 MED ORDER — FUROSEMIDE 40 MG PO TABS: 40.0000 mg | ORAL_TABLET | Freq: Every day | ORAL | 1 refills | Status: DC

## 2018-12-28 MED FILL — LOSARTAN POTASSIUM 100 MG T: 100 | 30 days supply | Qty: 30 | Fill #0

## 2018-12-28 MED FILL — ?OMEPRAZOLE 20 MG CAPSULE D: 20 | 30 days supply | Qty: 30 | Fill #0

## 2018-12-28 MED FILL — ATORVASTATIN 80 MG TABLET: 80 | 30 days supply | Qty: 30 | Fill #0

## 2018-12-28 MED FILL — METOPROLOL TARTRATE 25 MG T: 25 | 30 days supply | Qty: 60 | Fill #0

## 2018-12-28 MED FILL — LOSARTAN POTASSIUM 100 MG T: 100 | 30 days supply | Qty: 30 | Fill #4

## 2018-12-28 MED FILL — ?FUROSEMIDE 40 MG TABLET: 40 | 30 days supply | Qty: 30 | Fill #1

## 2018-12-28 MED FILL — ?FUROSEMIDE 40 MG TABLET: 40 | 30 days supply | Qty: 30 | Fill #0

## 2018-12-28 NOTE — Telephone Encounter (Signed)
Refilled

## 2019-01-18 ENCOUNTER — Telehealth: Payer: Medicaid Other | Admitting: Internal Medicine

## 2019-02-05 MED FILL — METOPROLOL TARTRATE 25 MG T: 25 | 30 days supply | Qty: 60 | Fill #1

## 2019-02-05 MED FILL — ATORVASTATIN 80 MG TABLET: 80 | 30 days supply | Qty: 30 | Fill #1

## 2019-02-05 MED FILL — ?OMEPRAZOLE 20 MG CAPSULE D: 20 | 30 days supply | Qty: 30 | Fill #1

## 2019-02-06 ENCOUNTER — Inpatient Hospital Stay (HOSPITAL_COMMUNITY): Admission: RE | Admit: 2019-02-06 | Payer: Medicaid Other | Source: Ambulatory Visit

## 2019-02-06 ENCOUNTER — Telehealth: Payer: Self-pay | Admitting: Family Medicine

## 2019-02-06 NOTE — Telephone Encounter (Signed)
Pt called. Pain began yesterday afternoon. Upper left side of stomach under ribs when breathing. Advised pt to go to urgent care of ED if persists as we do not have availability today. Pt desire advice or what to watch for as pt does not want to go to urgent care.

## 2019-02-06 NOTE — Telephone Encounter (Signed)
Pt c/o pain in left upper side under the rib cage. Denies SOB. Has cough d/t vaping per patient. Pain ongoing x 2 days. Appointment scheduled for 02/07/2019 at 2:50-- televisit.

## 2019-02-07 ENCOUNTER — Ambulatory Visit: Payer: Self-pay | Admitting: *Deleted

## 2019-02-07 ENCOUNTER — Other Ambulatory Visit: Payer: Self-pay

## 2019-03-29 ENCOUNTER — Encounter (HOSPITAL_COMMUNITY): Payer: Self-pay | Admitting: Emergency Medicine

## 2019-03-29 DIAGNOSIS — K862 Cyst of pancreas: Secondary | ICD-10-CM | POA: Diagnosis present

## 2019-03-29 DIAGNOSIS — E870 Hyperosmolality and hypernatremia: Secondary | ICD-10-CM | POA: Diagnosis not present

## 2019-03-29 DIAGNOSIS — E1142 Type 2 diabetes mellitus with diabetic polyneuropathy: Secondary | ICD-10-CM | POA: Diagnosis not present

## 2019-03-29 DIAGNOSIS — E778 Other disorders of glycoprotein metabolism: Secondary | ICD-10-CM | POA: Diagnosis not present

## 2019-03-29 DIAGNOSIS — E874 Mixed disorder of acid-base balance: Secondary | ICD-10-CM | POA: Diagnosis not present

## 2019-03-29 DIAGNOSIS — J690 Pneumonitis due to inhalation of food and vomit: Secondary | ICD-10-CM | POA: Diagnosis not present

## 2019-03-29 DIAGNOSIS — E1151 Type 2 diabetes mellitus with diabetic peripheral angiopathy without gangrene: Secondary | ICD-10-CM | POA: Diagnosis present

## 2019-03-29 DIAGNOSIS — F05 Delirium due to known physiological condition: Secondary | ICD-10-CM | POA: Diagnosis not present

## 2019-03-29 DIAGNOSIS — Z20828 Contact with and (suspected) exposure to other viral communicable diseases: Secondary | ICD-10-CM | POA: Diagnosis present

## 2019-03-29 DIAGNOSIS — D539 Nutritional anemia, unspecified: Secondary | ICD-10-CM | POA: Diagnosis not present

## 2019-03-29 DIAGNOSIS — E876 Hypokalemia: Secondary | ICD-10-CM | POA: Diagnosis not present

## 2019-03-29 DIAGNOSIS — Z7982 Long term (current) use of aspirin: Secondary | ICD-10-CM

## 2019-03-29 DIAGNOSIS — I8511 Secondary esophageal varices with bleeding: Principal | ICD-10-CM | POA: Diagnosis present

## 2019-03-29 DIAGNOSIS — K766 Portal hypertension: Secondary | ICD-10-CM | POA: Diagnosis present

## 2019-03-29 DIAGNOSIS — K5900 Constipation, unspecified: Secondary | ICD-10-CM | POA: Diagnosis not present

## 2019-03-29 DIAGNOSIS — F329 Major depressive disorder, single episode, unspecified: Secondary | ICD-10-CM | POA: Diagnosis present

## 2019-03-29 DIAGNOSIS — D6959 Other secondary thrombocytopenia: Secondary | ICD-10-CM | POA: Diagnosis present

## 2019-03-29 DIAGNOSIS — Z823 Family history of stroke: Secondary | ICD-10-CM

## 2019-03-29 DIAGNOSIS — F17210 Nicotine dependence, cigarettes, uncomplicated: Secondary | ICD-10-CM | POA: Diagnosis present

## 2019-03-29 DIAGNOSIS — R1310 Dysphagia, unspecified: Secondary | ICD-10-CM | POA: Diagnosis not present

## 2019-03-29 DIAGNOSIS — I2510 Atherosclerotic heart disease of native coronary artery without angina pectoris: Secondary | ICD-10-CM | POA: Diagnosis present

## 2019-03-29 DIAGNOSIS — K7040 Alcoholic hepatic failure without coma: Secondary | ICD-10-CM | POA: Diagnosis present

## 2019-03-29 DIAGNOSIS — F419 Anxiety disorder, unspecified: Secondary | ICD-10-CM | POA: Diagnosis present

## 2019-03-29 DIAGNOSIS — I952 Hypotension due to drugs: Secondary | ICD-10-CM | POA: Diagnosis not present

## 2019-03-29 DIAGNOSIS — N179 Acute kidney failure, unspecified: Secondary | ICD-10-CM | POA: Diagnosis present

## 2019-03-29 DIAGNOSIS — J9601 Acute respiratory failure with hypoxia: Secondary | ICD-10-CM | POA: Diagnosis not present

## 2019-03-29 DIAGNOSIS — G92 Toxic encephalopathy: Secondary | ICD-10-CM | POA: Diagnosis not present

## 2019-03-29 DIAGNOSIS — I5032 Chronic diastolic (congestive) heart failure: Secondary | ICD-10-CM | POA: Diagnosis present

## 2019-03-29 DIAGNOSIS — Z7401 Bed confinement status: Secondary | ICD-10-CM

## 2019-03-29 DIAGNOSIS — T4275XA Adverse effect of unspecified antiepileptic and sedative-hypnotic drugs, initial encounter: Secondary | ICD-10-CM | POA: Diagnosis not present

## 2019-03-29 DIAGNOSIS — L723 Sebaceous cyst: Secondary | ICD-10-CM | POA: Diagnosis present

## 2019-03-29 DIAGNOSIS — Z841 Family history of disorders of kidney and ureter: Secondary | ICD-10-CM

## 2019-03-29 DIAGNOSIS — F10231 Alcohol dependence with withdrawal delirium: Secondary | ICD-10-CM | POA: Diagnosis present

## 2019-03-29 DIAGNOSIS — I110 Hypertensive heart disease with heart failure: Secondary | ICD-10-CM | POA: Diagnosis present

## 2019-03-29 DIAGNOSIS — I480 Paroxysmal atrial fibrillation: Secondary | ICD-10-CM | POA: Diagnosis not present

## 2019-03-29 DIAGNOSIS — D62 Acute posthemorrhagic anemia: Secondary | ICD-10-CM | POA: Diagnosis not present

## 2019-03-29 DIAGNOSIS — E785 Hyperlipidemia, unspecified: Secondary | ICD-10-CM | POA: Diagnosis present

## 2019-03-29 DIAGNOSIS — I700 Atherosclerosis of aorta: Secondary | ICD-10-CM | POA: Diagnosis present

## 2019-03-29 DIAGNOSIS — E1165 Type 2 diabetes mellitus with hyperglycemia: Secondary | ICD-10-CM | POA: Diagnosis not present

## 2019-03-29 DIAGNOSIS — E861 Hypovolemia: Secondary | ICD-10-CM | POA: Diagnosis not present

## 2019-03-29 DIAGNOSIS — Z6841 Body Mass Index (BMI) 40.0 and over, adult: Secondary | ICD-10-CM

## 2019-03-29 DIAGNOSIS — K7011 Alcoholic hepatitis with ascites: Secondary | ICD-10-CM | POA: Diagnosis present

## 2019-03-29 DIAGNOSIS — E871 Hypo-osmolality and hyponatremia: Secondary | ICD-10-CM | POA: Diagnosis not present

## 2019-03-29 DIAGNOSIS — K7031 Alcoholic cirrhosis of liver with ascites: Secondary | ICD-10-CM | POA: Diagnosis present

## 2019-03-29 DIAGNOSIS — M1990 Unspecified osteoarthritis, unspecified site: Secondary | ICD-10-CM | POA: Diagnosis present

## 2019-03-29 DIAGNOSIS — K3189 Other diseases of stomach and duodenum: Secondary | ICD-10-CM | POA: Diagnosis present

## 2019-03-29 DIAGNOSIS — K219 Gastro-esophageal reflux disease without esophagitis: Secondary | ICD-10-CM | POA: Diagnosis present

## 2019-03-29 DIAGNOSIS — Z955 Presence of coronary angioplasty implant and graft: Secondary | ICD-10-CM

## 2019-03-29 DIAGNOSIS — K623 Rectal prolapse: Secondary | ICD-10-CM | POA: Diagnosis present

## 2019-03-29 DIAGNOSIS — Z23 Encounter for immunization: Secondary | ICD-10-CM

## 2019-03-29 DIAGNOSIS — E46 Unspecified protein-calorie malnutrition: Secondary | ICD-10-CM | POA: Diagnosis not present

## 2019-03-29 DIAGNOSIS — K760 Fatty (change of) liver, not elsewhere classified: Secondary | ICD-10-CM | POA: Diagnosis present

## 2019-03-29 DIAGNOSIS — K269 Duodenal ulcer, unspecified as acute or chronic, without hemorrhage or perforation: Secondary | ICD-10-CM | POA: Diagnosis present

## 2019-03-29 MED ORDER — SODIUM CHLORIDE 0.9% FLUSH
3.0000 mL | Freq: Once | INTRAVENOUS | Status: AC
  Administered 2019-03-30: 07:00:00 3 mL via INTRAVENOUS

## 2019-03-29 NOTE — ED Triage Notes (Signed)
Patient here from home with comp;laints of right upper flank pain that started 2 days. ETOH. Nausea, vomiting.

## 2019-03-30 ENCOUNTER — Emergency Department (HOSPITAL_COMMUNITY): Payer: Self-pay

## 2019-03-30 ENCOUNTER — Other Ambulatory Visit: Payer: Self-pay

## 2019-03-30 ENCOUNTER — Inpatient Hospital Stay (HOSPITAL_COMMUNITY)
Admission: EM | Admit: 2019-03-30 | Discharge: 2019-05-25 | DRG: 368 | Disposition: A | Discharge status: Home Health | Payer: Self-pay | Attending: Internal Medicine | Admitting: Internal Medicine

## 2019-03-30 ENCOUNTER — Encounter (HOSPITAL_COMMUNITY): Payer: Self-pay | Admitting: Physician Assistant

## 2019-03-30 DIAGNOSIS — F10231 Alcohol dependence with withdrawal delirium: Secondary | ICD-10-CM | POA: Diagnosis present

## 2019-03-30 DIAGNOSIS — R0682 Tachypnea, not elsewhere classified: Secondary | ICD-10-CM

## 2019-03-30 DIAGNOSIS — Z789 Other specified health status: Secondary | ICD-10-CM

## 2019-03-30 DIAGNOSIS — Z452 Encounter for adjustment and management of vascular access device: Secondary | ICD-10-CM

## 2019-03-30 DIAGNOSIS — I4891 Unspecified atrial fibrillation: Secondary | ICD-10-CM

## 2019-03-30 DIAGNOSIS — I5032 Chronic diastolic (congestive) heart failure: Secondary | ICD-10-CM | POA: Diagnosis present

## 2019-03-30 DIAGNOSIS — K9420 Gastrostomy complication, unspecified: Secondary | ICD-10-CM

## 2019-03-30 DIAGNOSIS — I119 Hypertensive heart disease without heart failure: Secondary | ICD-10-CM | POA: Diagnosis present

## 2019-03-30 DIAGNOSIS — E872 Acidosis, unspecified: Secondary | ICD-10-CM | POA: Diagnosis present

## 2019-03-30 DIAGNOSIS — N179 Acute kidney failure, unspecified: Secondary | ICD-10-CM

## 2019-03-30 DIAGNOSIS — F10931 Alcohol use, unspecified with withdrawal delirium: Secondary | ICD-10-CM | POA: Diagnosis present

## 2019-03-30 DIAGNOSIS — K922 Gastrointestinal hemorrhage, unspecified: Secondary | ICD-10-CM | POA: Diagnosis present

## 2019-03-30 DIAGNOSIS — Z9911 Dependence on respirator [ventilator] status: Secondary | ICD-10-CM

## 2019-03-30 DIAGNOSIS — F411 Generalized anxiety disorder: Secondary | ICD-10-CM | POA: Diagnosis present

## 2019-03-30 DIAGNOSIS — K7010 Alcoholic hepatitis without ascites: Secondary | ICD-10-CM | POA: Diagnosis present

## 2019-03-30 DIAGNOSIS — R062 Wheezing: Secondary | ICD-10-CM

## 2019-03-30 DIAGNOSIS — K8689 Other specified diseases of pancreas: Secondary | ICD-10-CM

## 2019-03-30 DIAGNOSIS — D539 Nutritional anemia, unspecified: Secondary | ICD-10-CM | POA: Diagnosis present

## 2019-03-30 DIAGNOSIS — Z4659 Encounter for fitting and adjustment of other gastrointestinal appliance and device: Secondary | ICD-10-CM

## 2019-03-30 DIAGNOSIS — K869 Disease of pancreas, unspecified: Secondary | ICD-10-CM

## 2019-03-30 DIAGNOSIS — D696 Thrombocytopenia, unspecified: Secondary | ICD-10-CM | POA: Diagnosis present

## 2019-03-30 DIAGNOSIS — Z955 Presence of coronary angioplasty implant and graft: Secondary | ICD-10-CM

## 2019-03-30 DIAGNOSIS — F1020 Alcohol dependence, uncomplicated: Secondary | ICD-10-CM | POA: Diagnosis present

## 2019-03-30 DIAGNOSIS — M7989 Other specified soft tissue disorders: Secondary | ICD-10-CM | POA: Clinically undetermined

## 2019-03-30 DIAGNOSIS — R0600 Dyspnea, unspecified: Secondary | ICD-10-CM

## 2019-03-30 DIAGNOSIS — F05 Delirium due to known physiological condition: Secondary | ICD-10-CM

## 2019-03-30 DIAGNOSIS — K7200 Acute and subacute hepatic failure without coma: Secondary | ICD-10-CM | POA: Diagnosis present

## 2019-03-30 DIAGNOSIS — K7290 Hepatic failure, unspecified without coma: Secondary | ICD-10-CM

## 2019-03-30 DIAGNOSIS — D500 Iron deficiency anemia secondary to blood loss (chronic): Secondary | ICD-10-CM | POA: Diagnosis present

## 2019-03-30 DIAGNOSIS — K567 Ileus, unspecified: Secondary | ICD-10-CM

## 2019-03-30 DIAGNOSIS — K709 Alcoholic liver disease, unspecified: Secondary | ICD-10-CM

## 2019-03-30 DIAGNOSIS — E870 Hyperosmolality and hypernatremia: Secondary | ICD-10-CM | POA: Diagnosis not present

## 2019-03-30 DIAGNOSIS — R0689 Other abnormalities of breathing: Secondary | ICD-10-CM

## 2019-03-30 DIAGNOSIS — K7682 Hepatic encephalopathy: Secondary | ICD-10-CM | POA: Diagnosis present

## 2019-03-30 DIAGNOSIS — R0902 Hypoxemia: Secondary | ICD-10-CM

## 2019-03-30 DIAGNOSIS — R410 Disorientation, unspecified: Secondary | ICD-10-CM

## 2019-03-30 DIAGNOSIS — M25531 Pain in right wrist: Secondary | ICD-10-CM

## 2019-03-30 DIAGNOSIS — R109 Unspecified abdominal pain: Secondary | ICD-10-CM

## 2019-03-30 HISTORY — DX: Headache, unspecified: R51.9

## 2019-03-30 HISTORY — DX: Angina pectoris, unspecified: I20.9

## 2019-03-30 HISTORY — DX: Alcoholic hepatitis without ascites: K70.10

## 2019-03-30 HISTORY — DX: Anemia, unspecified: D64.9

## 2019-03-30 HISTORY — DX: Acute kidney failure, unspecified: N17.9

## 2019-03-30 LAB — COMPREHENSIVE METABOLIC PANEL
ALT: 73 U/L — ABNORMAL HIGH (ref 0–44)
ALT: 72 U/L — ABNORMAL HIGH (ref 0–44)
AST: 183 U/L — ABNORMAL HIGH (ref 15–41)
AST: 190 U/L — ABNORMAL HIGH (ref 15–41)
Albumin: 3.1 g/dL — ABNORMAL LOW (ref 3.5–5.0)
Albumin: 2.9 g/dL — ABNORMAL LOW (ref 3.5–5.0)
Alkaline Phosphatase: 311 U/L — ABNORMAL HIGH (ref 38–126)
Alkaline Phosphatase: 315 U/L — ABNORMAL HIGH (ref 38–126)
Anion gap: 27 — ABNORMAL HIGH (ref 5–15)
Anion gap: 26 — ABNORMAL HIGH (ref 5–15)
BUN: 72 mg/dL — ABNORMAL HIGH (ref 6–20)
BUN: 68 mg/dL — ABNORMAL HIGH (ref 6–20)
CO2: 13 mmol/L — ABNORMAL LOW (ref 22–32)
CO2: 12 mmol/L — ABNORMAL LOW (ref 22–32)
Calcium: 7.5 mg/dL — ABNORMAL LOW (ref 8.9–10.3)
Calcium: 7 mg/dL — ABNORMAL LOW (ref 8.9–10.3)
Chloride: 99 mmol/L (ref 98–111)
Chloride: 103 mmol/L (ref 98–111)
Creatinine, Ser: 3.67 mg/dL — ABNORMAL HIGH (ref 0.61–1.24)
Creatinine, Ser: 2.88 mg/dL — ABNORMAL HIGH (ref 0.61–1.24)
GFR calc Af Amer: 20 mL/min — ABNORMAL LOW (ref >60)
GFR calc Af Amer: 26 mL/min — ABNORMAL LOW (ref >60)
GFR calc non Af Amer: 17 mL/min — ABNORMAL LOW (ref >60)
GFR calc non Af Amer: 23 mL/min — ABNORMAL LOW (ref >60)
Glucose, Bld: 86 mg/dL (ref 70–99)
Glucose, Bld: 78 mg/dL (ref 70–99)
Potassium: 4.9 mmol/L (ref 3.5–5.1)
Potassium: 5.2 mmol/L — ABNORMAL HIGH (ref 3.5–5.1)
Sodium: 139 mmol/L (ref 135–145)
Sodium: 141 mmol/L (ref 135–145)
Total Bilirubin: 2.6 mg/dL — ABNORMAL HIGH (ref 0.3–1.2)
Total Bilirubin: 3.1 mg/dL — ABNORMAL HIGH (ref 0.3–1.2)
Total Protein: 7.3 g/dL (ref 6.5–8.1)
Total Protein: 7.1 g/dL (ref 6.5–8.1)

## 2019-03-30 LAB — URINALYSIS, ROUTINE W REFLEX MICROSCOPIC
Bacteria, UA: NONE SEEN
Bilirubin Urine: NEGATIVE
Glucose, UA: NEGATIVE mg/dL
Ketones, ur: 5 mg/dL — AB
Leukocytes,Ua: NEGATIVE
Nitrite: NEGATIVE
Protein, ur: NEGATIVE mg/dL
Specific Gravity, Urine: 1.012 (ref 1.005–1.030)
pH: 5 (ref 5.0–8.0)

## 2019-03-30 LAB — CBC
HCT: 26 % — ABNORMAL LOW (ref 39.0–52.0)
HCT: 26.7 % — ABNORMAL LOW (ref 39.0–52.0)
Hemoglobin: 8.5 g/dL — ABNORMAL LOW (ref 13.0–17.0)
Hemoglobin: 8.6 g/dL — ABNORMAL LOW (ref 13.0–17.0)
MCH: 38.1 pg — ABNORMAL HIGH (ref 26.0–34.0)
MCH: 37.9 pg — ABNORMAL HIGH (ref 26.0–34.0)
MCHC: 32.7 g/dL (ref 30.0–36.0)
MCHC: 32.2 g/dL (ref 30.0–36.0)
MCV: 116.6 fL — ABNORMAL HIGH (ref 80.0–100.0)
MCV: 117.6 fL — ABNORMAL HIGH (ref 80.0–100.0)
Platelets: 39 10*3/uL — ABNORMAL LOW (ref 150–400)
Platelets: 34 10*3/uL — ABNORMAL LOW (ref 150–400)
RBC: 2.23 MIL/uL — ABNORMAL LOW (ref 4.22–5.81)
RBC: 2.27 MIL/uL — ABNORMAL LOW (ref 4.22–5.81)
RDW: 16.3 % — ABNORMAL HIGH (ref 11.5–15.5)
RDW: 16 % — ABNORMAL HIGH (ref 11.5–15.5)
WBC: 8.9 10*3/uL (ref 4.0–10.5)
WBC: 8.5 10*3/uL (ref 4.0–10.5)
nRBC: 0 % (ref 0.0–0.2)
nRBC: 0 % (ref 0.0–0.2)

## 2019-03-30 LAB — PROTIME-INR
INR: 1.3 — ABNORMAL HIGH (ref 0.8–1.2)
Prothrombin Time: 16.5 seconds — ABNORMAL HIGH (ref 11.4–15.2)

## 2019-03-30 LAB — SARS CORONAVIRUS 2 BY RT PCR (HOSPITAL ORDER, PERFORMED IN ~~LOC~~ HOSPITAL LAB): SARS Coronavirus 2: NEGATIVE

## 2019-03-30 LAB — MRSA PCR SCREENING: MRSA by PCR: NEGATIVE

## 2019-03-30 LAB — LIPASE, BLOOD: Lipase: 64 U/L — ABNORMAL HIGH (ref 11–51)

## 2019-03-30 MED ORDER — SODIUM CHLORIDE 0.9% FLUSH
3.0000 mL | Freq: Two times a day (BID) | INTRAVENOUS | Status: DC
  Administered 2019-03-31 – 2019-04-08 (×16): 3 mL via INTRAVENOUS

## 2019-03-30 MED ORDER — SODIUM CHLORIDE 0.9 % IV SOLN
50.0000 ug/h | INTRAVENOUS | Status: DC
  Administered 2019-03-30 – 2019-04-04 (×11): 50 ug/h via INTRAVENOUS
  Filled 2019-03-30 (×14): qty 1

## 2019-03-30 MED ORDER — LORAZEPAM 1 MG PO TABS: 0.0000 mg | ORAL_TABLET | Freq: Two times a day (BID) | ORAL | Status: AC

## 2019-03-30 MED ORDER — METOPROLOL TARTRATE 25 MG PO TABS
25.0000 mg | ORAL_TABLET | Freq: Two times a day (BID) | ORAL | Status: DC
  Administered 2019-03-30: 25 mg via ORAL
  Filled 2019-03-30 (×2): qty 1

## 2019-03-30 MED ORDER — LORAZEPAM 2 MG/ML IJ SOLN
0.0000 mg | Freq: Four times a day (QID) | INTRAMUSCULAR | Status: AC
  Administered 2019-03-31 (×3): 2 mg via INTRAVENOUS
  Filled 2019-03-30 (×3): qty 1

## 2019-03-30 MED ORDER — VITAMIN B-1 100 MG PO TABS: 100.0000 mg | ORAL_TABLET | Freq: Every day | ORAL | Status: DC

## 2019-03-30 MED ORDER — CHLORHEXIDINE GLUCONATE CLOTH 2 % EX PADS
6.0000 | MEDICATED_PAD | Freq: Every day | CUTANEOUS | Status: DC
  Administered 2019-03-30 – 2019-05-25 (×53): 6 via TOPICAL

## 2019-03-30 MED ORDER — LORAZEPAM 2 MG/ML IJ SOLN
0.0000 mg | Freq: Two times a day (BID) | INTRAMUSCULAR | Status: AC
  Administered 2019-04-02: 1 mg via INTRAVENOUS
  Filled 2019-03-30: qty 1

## 2019-03-30 MED ORDER — LORAZEPAM 2 MG/ML IJ SOLN
0.0000 mg | Freq: Four times a day (QID) | INTRAMUSCULAR | Status: DC
  Administered 2019-03-30: 10:00:00 2 mg via INTRAVENOUS
  Administered 2019-03-30: 1 mg via INTRAVENOUS
  Filled 2019-03-30 (×2): qty 1

## 2019-03-30 MED ORDER — LORAZEPAM 1 MG PO TABS: 0.0000 mg | ORAL_TABLET | Freq: Four times a day (QID) | ORAL | Status: DC

## 2019-03-30 MED ORDER — SODIUM CHLORIDE 0.9 % IV BOLUS
1000.0000 mL | Freq: Once | INTRAVENOUS | Status: AC
  Administered 2019-03-30: 1000 mL via INTRAVENOUS

## 2019-03-30 MED ORDER — PANTOPRAZOLE SODIUM 40 MG IV SOLR
40.0000 mg | Freq: Two times a day (BID) | INTRAVENOUS | Status: DC
  Administered 2019-03-30 – 2019-04-06 (×15): 40 mg via INTRAVENOUS
  Filled 2019-03-30 (×15): qty 40

## 2019-03-30 MED ORDER — LORAZEPAM 1 MG PO TABS
0.0000 mg | ORAL_TABLET | Freq: Four times a day (QID) | ORAL | Status: AC
  Administered 2019-03-30: 2 mg via ORAL
  Filled 2019-03-30 (×2): qty 2

## 2019-03-30 MED ORDER — OCTREOTIDE LOAD VIA INFUSION
50.0000 ug | Freq: Once | INTRAVENOUS | Status: AC
  Administered 2019-03-30: 50 ug via INTRAVENOUS
  Filled 2019-03-30: qty 25

## 2019-03-30 MED ORDER — PROMETHAZINE HCL 25 MG PO TABS: 12.5000 mg | ORAL_TABLET | Freq: Four times a day (QID) | ORAL | Status: DC | PRN

## 2019-03-30 MED ORDER — SODIUM CHLORIDE 0.9 % IV SOLN
INTRAVENOUS | Status: DC
  Administered 2019-03-30 – 2019-03-31 (×3): via INTRAVENOUS

## 2019-03-30 MED ORDER — ORAL CARE MOUTH RINSE
15.0000 mL | Freq: Two times a day (BID) | OROMUCOSAL | Status: DC
  Administered 2019-03-31 – 2019-04-01 (×3): 15 mL via OROMUCOSAL

## 2019-03-30 MED ORDER — THIAMINE HCL 100 MG/ML IJ SOLN
100.0000 mg | Freq: Every day | INTRAMUSCULAR | Status: DC
  Administered 2019-03-30 – 2019-04-01 (×3): 100 mg via INTRAVENOUS
  Filled 2019-03-30 (×3): qty 2

## 2019-03-30 MED ORDER — MORPHINE SULFATE (PF) 4 MG/ML IV SOLN
4.0000 mg | INTRAVENOUS | Status: DC | PRN
  Administered 2019-04-01: 4 mg via INTRAVENOUS
  Filled 2019-03-30: qty 1

## 2019-03-30 MED ORDER — CHLORDIAZEPOXIDE HCL 25 MG PO CAPS
50.0000 mg | ORAL_CAPSULE | Freq: Once | ORAL | Status: AC
  Administered 2019-03-30: 14:00:00 50 mg via ORAL
  Filled 2019-03-30: qty 2

## 2019-03-30 NOTE — ED Notes (Signed)
Per pt it is LEFT flank pain not right as previously charted

## 2019-03-30 NOTE — ED Notes (Signed)
Patient urinated on himself and the floor again. Full linen change, gown change, peri care completed. Patient transported to [facility] by [employer].

## 2019-03-30 NOTE — ED Provider Notes (Signed)
[place] DEPT Provider Note  CSN: [id number] Arrival date & time: 03/29/19 2338  Chief Complaint(s) Flank Pain  HPI Richard Calhoun is a 58 y.o. male   The history is provided by the patient.  Abdominal Pain Pain location:  LUQ Pain quality: aching   Pain radiates to:  L flank and back Pain severity:  Moderate Onset quality:  Gradual Duration:  1 week Timing:  Constant Progression:  Waxing and waning Chronicity:  New Context: alcohol use   Relieved by:  Nothing Worsened by:  Nothing Associated symptoms: fatigue, nausea and vomiting   Associated symptoms: no chest pain, no diarrhea, no fever, no melena and no shortness of breath   Risk factors: alcohol abuse     Past Medical History Past Medical History:  Diagnosis Date   ALLERGIC RHINITIS    ANXIETY    BICEPS TENDON RUPTURE, RIGHT    CAD S/P percutaneous coronary angioplasty 03/2017   Cath: mRCA 99-100% CTO (calcific w/ L-R collaterals), p-mLAD calcified 60-80% (FFR 0.74 after D2), 50% mCx. --> Staged Atherectomy-DES PCI of p-mLAD (Synergy DES 3.0 x 24 -- 3.3 mm)   DEPENDENCE, ALCOHOL NEC/NOS, UNSPECIFIED    DEPRESSION    GERD    HYPERLIPIDEMIA    HYPERTENSION    Impaired glucose tolerance    OSTEOARTHRITIS    Tubular adenoma of colon 06/2006   Patient Active Problem List   Diagnosis Date Noted   Decreased pulses in feet 11/16/2018   Pain in both lower extremities 11/16/2018   Localized swelling of both lower legs 11/16/2018   S/P drug eluting coronary stent placement 07/20/2017   Tinnitus of left ear 07/08/2017   Hand edema 04/19/2017   Hypophosphatemia 04/19/2017   Coronary artery disease involving native coronary artery of native heart without angina pectoris    Acute hyperactive alcohol withdrawal delirium (HCC)    Chest pain on exertion 04/15/2017   Chest pain 04/14/2017   Hypokalemia 04/14/2017   Tobacco abuse 04/14/2017   Hypertensive heart  disease without heart failure 03/25/2017   Tachycardia 03/25/2017   Infected sebaceous cyst of skin 10/23/2012   Diarrhea 08/29/2012   Impaired glucose tolerance 05/18/2011   Preventative health care 05/16/2011   BICEPS TENDON RUPTURE, RIGHT 06/01/2010   ANXIETY 10/30/2009   ANGIOEDEMA 04/24/2009   Hyperlipidemia LDL goal <70 02/21/2007   EtOH dependence ([place]) 02/21/2007   DEPRESSION 02/21/2007   Uncontrolled hypertension 02/21/2007   HEMORRHOIDS 02/21/2007   ALLERGIC RHINITIS 02/21/2007   GERD 02/21/2007   OSTEOARTHRITIS 02/21/2007   LOW BACK PAIN 02/21/2007   Headache(784.0) 02/21/2007   COLONIC POLYPS, HX OF 02/21/2007   Home Medication(s) Prior to Admission medications   Medication Sig Start Date End Date Taking? Authorizing Provider  aspirin EC 81 MG tablet Take 81 mg by mouth every other day.     [provider]  atorvastatin (LIPITOR) 80 MG tablet Take 1 tablet (80 mg total) by mouth daily at 6 PM. 12/28/18   Charlott Rakes, MD  clotrimazole-betamethasone (LOTRISONE) cream Apply 1 application topically 2 (two) times daily. 11/24/18   Charlott Rakes, MD  doxycycline (VIBRA-TABS) 100 MG tablet Take 1 tablet (100 mg total) by mouth 2 (two) times daily. 11/17/18   Ladell Pier, MD  furosemide (LASIX) 40 MG tablet Take 1 tablet (40 mg total) by mouth daily. 12/28/18 03/28/19  Charlott Rakes, MD  losartan (COZAAR) 100 MG tablet Take 1 tablet (100 mg total) by mouth daily. 12/28/18   Charlott Rakes, MD  metoprolol tartrate (LOPRESSOR) 25 MG tablet Take 1 tablet (25 mg total) by mouth 2 (two) times daily. 12/28/18   Charlott Rakes, MD  Multiple Vitamins-Minerals (CENTRUM) tablet Take 1 tablet by mouth daily.     [provider]  nitroGLYCERIN (NITROSTAT) 0.4 MG SL tablet Place 1 tablet (0.4 mg total) under the tongue every 5 (five) minutes as needed for chest pain. 11/13/18   Hilty, Nadean Corwin, MD  omeprazole (PRILOSEC OTC) 20 MG tablet Take 1  tablet (20 mg total) by mouth daily. 12/28/18   Charlott Rakes, MD  vitamin C (ASCORBIC ACID) 500 MG tablet Take 500 mg by mouth daily.    [provider]                                                                                                                                    Past Surgical History Past Surgical History:  Procedure Laterality Date   CORONARY ATHERECTOMY N/A 04/20/2017   Procedure: CORONARY ATHERECTOMY;  Surgeon: Wellington Hampshire, MD;  Location: [facility];  Service: Cardiovascular:: p-mLAD (Orbital)   CORONARY STENT INTERVENTION N/A 04/20/2017   Procedure: CORONARY STENT INTERVENTION;  Surgeon: Wellington Hampshire, MD;  Location: [facility];  Service: Cardiovascular:: Staged Orbital Atherectomy-DES PCI of p-mLAD (Synergy DES 3.0 x 24 -- 3.3 mm)   FOOT SURGERY     INTRAVASCULAR PRESSURE WIRE/FFR STUDY N/A 04/15/2017   Procedure: INTRAVASCULAR PRESSURE WIRE/FFR STUDY;  Surgeon: Belva Crome, MD;  Location: [facility];  Service: Cardiovascular;  LAD = 0.74 after D2   LEFT HEART CATH AND CORONARY ANGIOGRAPHY N/A 04/15/2017   Procedure: LEFT HEART CATH AND CORONARY ANGIOGRAPHY;  Surgeon: Belva Crome, MD;  Location: [facility];  Service: Cardiovascular;:: mRCA 99-100% CTO (calcific w/ L-R collaterals), p-mLAD calcified 60-80% (FFR 0.74 after D2), 50% mCx. --> staged PCI LAD   MANDIBLE FRACTURE SURGERY     TRANSTHORACIC ECHOCARDIOGRAM  01/2017   Moderate LVH.  EF 60 to 65%.  No R WMA.  GR 1 DD. Mild AI.    Family History Family History  Problem Relation Age of Onset   Kidney disease Sister    Stroke Mother    Stroke Father     Social History Social History   Tobacco Use   Smoking status: Light Tobacco Smoker    Types: E-cigarettes    Last attempt to quit: 2017    Years since quitting: 3.6   Smokeless tobacco: Never Used   Tobacco comment: states he is smoking 1 pack 1/2 now that he is unemployed   Substance  Use Topics   Alcohol use: Yes    Alcohol/week: 0.0 standard drinks   Drug use: No   Allergies Penicillins  Review of Systems Review of Systems  Constitutional: Positive for fatigue. Negative for fever.  Respiratory: Negative for shortness of breath.   Cardiovascular: Negative for chest  pain.  Gastrointestinal: Positive for abdominal pain, nausea and vomiting. Negative for diarrhea and melena.   All other systems are reviewed and are negative for acute change except as noted in the HPI  Physical Exam Vital Signs  I have reviewed the triage vital signs BP 126/68 (BP Location: Right Arm)    Pulse 96    Temp (!) 97 F (36.1 C) (Oral)    Resp 19    SpO2 98%   Physical Exam Vitals signs reviewed.  Constitutional:      General: He is not in acute distress.    Appearance: He is well-developed. He is not diaphoretic.  HENT:     Head: Normocephalic and atraumatic.     Jaw: No trismus.     Right Ear: External ear normal.     Left Ear: External ear normal.     Nose: Nose normal.  Eyes:     General: No scleral icterus.    Conjunctiva/sclera: Conjunctivae normal.  Neck:     Musculoskeletal: Normal range of motion.     Trachea: Phonation normal.  Cardiovascular:     Rate and Rhythm: Normal rate and regular rhythm.  Pulmonary:     Effort: Pulmonary effort is normal. No respiratory distress.     Breath sounds: No stridor.  Abdominal:     General: There is no distension.     Tenderness: There is abdominal tenderness in the left upper quadrant. There is no guarding or rebound. Negative signs include Murphy's sign.    Musculoskeletal: Normal range of motion.  Neurological:     Mental Status: He is alert and oriented to person, place, and time.  Psychiatric:        Behavior: Behavior normal.     ED Results and Treatments Labs (all labs ordered are listed, but only abnormal results are displayed) Labs Reviewed  URINALYSIS, ROUTINE W REFLEX MICROSCOPIC - Abnormal; Notable for  the following components:      Result Value   Hgb urine dipstick SMALL (*)    Ketones, ur 5 (*)    All other components within normal limits  LIPASE, BLOOD  COMPREHENSIVE METABOLIC PANEL  CBC                                                                                                                         EKG  EKG Interpretation  Date/Time:    Ventricular Rate:    PR Interval:    QRS Duration:   QT Interval:    QTC Calculation:   R Axis:     Text Interpretation:        Radiology No results found.  Pertinent labs & imaging results that were available during my care of the patient were reviewed by me and considered in my medical decision making (see chart for details).  Medications Ordered in ED Medications  sodium chloride flush (NS) 0.9 % injection 3 mL (has no administration in time range)  sodium chloride 0.9 % bolus  1,000 mL (has no administration in time range)                                                                                                                                    Procedures .Critical Care Performed by: Fatima Blank, MD Authorized by: Fatima Blank, MD     CRITICAL CARE Performed by: Grayce Sessions Gregori Abril Total critical care time: 35 minutes Critical care time was exclusive of separately billable procedures and treating other patients. Critical care was necessary to treat or prevent imminent or life-threatening deterioration. Critical care was time spent personally by me on the following activities: development of treatment plan with patient and/or surrogate as well as nursing, discussions with consultants, evaluation of patient's response to treatment, examination of patient, obtaining history from patient or surrogate, ordering and performing treatments and interventions, ordering and review of laboratory studies, ordering and review of radiographic studies, pulse oximetry and re-evaluation of patient's  condition.   (including critical care time)  Medical Decision Making / ED Course I have reviewed the nursing notes for this encounter and the patient's prior records (if available in EHR or on provided paperwork).   Richard Calhoun was evaluated in Emergency Department on 03/30/2019 for the symptoms described in the history of present illness. He was evaluated in the context of the global COVID-19 pandemic, which necessitated consideration that the patient might be at risk for infection with the SARS-CoV-2 virus that causes COVID-19. Institutional protocols and algorithms that pertain to the evaluation of patients at risk for COVID-19 are in a state of rapid change based on information released by regulatory bodies including the CDC and federal and state organizations. These policies and algorithms were followed during the patient's care in the ED.  Patient presents with left upper quadrant/flank abdominal pain. History of alcohol abuse. On exam has ecchymosis of the left flank.  Reports frequent falls but does not remember any recent falls.. Abdomen tender to palpation but not peritonitis.  Labs notable for renal insufficiency, and a 3 g hemoglobin drop. IV fluids provided. Patient requires CT scan to assess for hemoperitoneum related to fall.  If negative, patient will still require admission for renal insufficiency.  Patient care turned over to Dr Rex Kras. Patient case and results discussed in detail; please see their note for further ED managment.           This chart was dictated using voice recognition software.  Despite best efforts to proofread,  errors can occur which can change the documentation meaning.   Fatima Blank, MD 03/30/19 [phone]

## 2019-03-30 NOTE — H&P (Addendum)
HPI  Richard Calhoun [id number] DOB: 1961/04/15 DOA: 03/30/2019  PCP: Charlott Rakes, MD   Chief Complaint: Fall, pain  HPI:  10 white male community dwelling known chronic ethanol habituation half-gallon RuM daily, chronic tobacco use, CAD with subtotal CTO + RCA atherectomy and PCI of LAD-unstable angina 2018 status post cath staged atherectomy with drug-eluting stent placement at that time Chronic heart failure, hyperlipidemia, mild peripheral vascular disease, paroxysmal tachycardia  At the time of his admission in 2018 when he had a stent placement he needed Precedex  Comes into the hospital over the past several days with increasing malaise-actually vomited a lot of bright red blood yesterday he states it was bright red in consistency and has not eaten in the past 3 days He does occasionally vape and used to be a heavy smoker previous to this He continues to drink alcohol He cannot recall any falls  Because of diagnostic uncertainty on the part of the emergency room physician a CT abdomen pelvis was done--- it appears that they did not think that this was a trauma case therefore trauma series was not performed--- I am obtaining a secondhand report from Dr. Rex Kras as the attending physician who saw the patient initially has left This showed the below findings  Review of Systems:  Pertinent +'s: Abdominal pain, discomfort, he has tremors, no current vomiting although did vomit blood 1 day ago large amount Anorexia, rash, lower extremity edema is present as well Pertinent -"s: No dark or tarry stool, no unsteadiness, no weakness, no falls  ED Course: CT was obtained labs were obtained, patient kept n.p.o. Patient was not given any saline despite AKI Started on Ativan protocol   Past Medical History:  Diagnosis Date  . ALLERGIC RHINITIS   . ANXIETY   . BICEPS TENDON RUPTURE, RIGHT   . CAD S/P percutaneous coronary angioplasty 03/2017   Cath: mRCA 99-100% CTO (calcific w/  L-R collaterals), p-mLAD calcified 60-80% (FFR 0.74 after D2), 50% mCx. --> Staged Atherectomy-DES PCI of p-mLAD (Synergy DES 3.0 x 24 -- 3.3 mm)  . DEPENDENCE, ALCOHOL NEC/NOS, UNSPECIFIED   . DEPRESSION   . GERD   . HYPERLIPIDEMIA   . HYPERTENSION   . Impaired glucose tolerance   . OSTEOARTHRITIS   . Tubular adenoma of colon 06/2006   Past Surgical History:  Procedure Laterality Date  . CORONARY ATHERECTOMY N/A 04/20/2017   Procedure: CORONARY ATHERECTOMY;  Surgeon: Wellington Hampshire, MD;  Location: [facility];  Service: Cardiovascular:: p-mLAD (Orbital)  . CORONARY STENT INTERVENTION N/A 04/20/2017   Procedure: CORONARY STENT INTERVENTION;  Surgeon: Wellington Hampshire, MD;  Location: [facility];  Service: Cardiovascular:: Staged Orbital Atherectomy-DES PCI of p-mLAD (Synergy DES 3.0 x 24 -- 3.3 mm)  . FOOT SURGERY    . INTRAVASCULAR PRESSURE WIRE/FFR STUDY N/A 04/15/2017   Procedure: INTRAVASCULAR PRESSURE WIRE/FFR STUDY;  Surgeon: Belva Crome, MD;  Location: [facility];  Service: Cardiovascular;  LAD = 0.74 after D2  . LEFT HEART CATH AND CORONARY ANGIOGRAPHY N/A 04/15/2017   Procedure: LEFT HEART CATH AND CORONARY ANGIOGRAPHY;  Surgeon: Belva Crome, MD;  Location: [facility];  Service: Cardiovascular;:: mRCA 99-100% CTO (calcific w/ L-R collaterals), p-mLAD calcified 60-80% (FFR 0.74 after D2), 50% mCx. --> staged PCI LAD  . MANDIBLE FRACTURE SURGERY    . TRANSTHORACIC ECHOCARDIOGRAM  01/2017   Moderate LVH.  EF 60 to 65%.  No R WMA.  GR 1 DD.  Mild AI.     reports that he has been smoking e-cigarettes. He has never used smokeless tobacco. He reports current alcohol use. He reports that he does not use drugs.  Mobility: Independent usually Has not worked in the past 8 years for multiple reasons Mother had a CVA Father died at age 80 with probable heart failure  Allergies  Allergen Reactions  . Penicillins Other (See Comments)    Childhood  reaction Has patient had a PCN reaction causing immediate rash, facial/tongue/throat swelling, SOB or lightheadedness with hypotension: Unknown Has patient had a PCN reaction causing severe rash involving mucus membranes or skin necrosis: Unknown Has patient had a PCN reaction that required hospitalization: No Has patient had a PCN reaction occurring within the last 10 years: No If all of the above answers are "NO", then may proceed with Cephalosporin use.    Family History  Problem Relation Age of Onset  . Kidney disease Sister   . Stroke Mother   . Stroke Father    Prior to Admission medications   Medication Sig Start Date End Date Taking? Authorizing Provider  atorvastatin (LIPITOR) 80 MG tablet Take 1 tablet (80 mg total) by mouth daily at 6 PM. 12/28/18  Yes Newlin, Enobong, MD  losartan (COZAAR) 100 MG tablet Take 1 tablet (100 mg total) by mouth daily. 12/28/18  Yes Charlott Rakes, MD  metoprolol tartrate (LOPRESSOR) 25 MG tablet Take 1 tablet (25 mg total) by mouth 2 (two) times daily. 12/28/18  Yes Charlott Rakes, MD  Multiple Vitamins-Minerals (CENTRUM) tablet Take 1 tablet by mouth daily.    Yes [provider]  nitroGLYCERIN (NITROSTAT) 0.4 MG SL tablet Place 1 tablet (0.4 mg total) under the tongue every 5 (five) minutes as needed for chest pain. 11/13/18  Yes Hilty, Nadean Corwin, MD  omeprazole (PRILOSEC OTC) 20 MG tablet Take 1 tablet (20 mg total) by mouth daily. 12/28/18  Yes Charlott Rakes, MD  polycarbophil (FIBERCON) 625 MG tablet Take 625 mg by mouth daily.   Yes [provider]  clotrimazole-betamethasone (LOTRISONE) cream Apply 1 application topically 2 (two) times daily. Patient not taking: Reported on 03/30/2019 11/24/18   Charlott Rakes, MD  doxycycline (VIBRA-TABS) 100 MG tablet Take 1 tablet (100 mg total) by mouth 2 (two) times daily. Patient not taking: Reported on 03/30/2019 11/17/18   Ladell Pier, MD  furosemide (LASIX) 40 MG tablet Take 1  tablet (40 mg total) by mouth daily. 12/28/18 03/28/19  Charlott Rakes, MD    Physical Exam:  Vitals:   03/30/19 0700 03/30/19 0830  BP: (!) 109/52 (!) 121/56  Pulse: 78 84  Resp:    Temp:    SpO2: 98% 96%     Awake alert coherent looking older than stated age unkempt  No icterus no pallor  Neck soft supple, no JVD  Chest clinically clear  abd soft nt-no rebound diffusely --has a L sided ematoma under ribs around back     Lower extremity edema is present  ROM intact lower extremities ROM intact to neck ROM intact to upper areas of the back  I have personally reviewed following labs and imaging studies  Labs:   Sodium 139 potassium 4.9 bicarb 13  BUN/creatinine 72/3.6 usual baseline is 20/1.0  Alk phos is 311 lipase is 64  AST/ALT 183/73-baseline done in 2018 showed this ratio was AST/ALT 43/34  INR not performed  Hemoglobin down from 11.2 in 2019-8.5, platelets 39  Imaging studies:   CT abdomen pelvis  IMPRESSION: 1. Unusual appearance of the tail of the pancreas, poorly characterized on today's noncontrast CT examination. This could represent a complex pancreatic pseudocyst related to pancreatitis, however, the possibility of underlying mass is not entirely excluded. The appearance is less likely related to trauma. Further characterization with pancreatic protocol CT scan or abdominal MRI with and without IV gadolinium with MRCP is recommended to exclude underlying malignancy. 2. No definite signs of significant acute traumatic injury in the chest, abdomen or pelvis. 3. Multiple prominent borderline enlarged and mildly enlarged retroperitoneal lymph nodes and mildly enlarged subcarinal lymph node. This is nonspecific and should be correlated with results of forthcoming abdominal scan to evaluate the pancreas, as metastatic disease is not excluded. 4. Probable post infectious or inflammatory scarring or atelectasis in the right lower lobe. 5.  Cholelithiasis without evidence of acute cholecystitis at this time. 6. Hepatomegaly with severe hepatic steatosis. 7. Aortic atherosclerosis, in addition to left main and 3 vessel coronary artery disease. Please note that although the presence of coronary artery calcium documents the presence of coronary artery disease, the severity of this disease and any potential stenosis cannot be assessed on this non-gated CT examination. Assessment for potential risk factor modification, dietary therapy or pharmacologic therapy may be warranted, if clinically indicated. 8. Additional incidental findings, as above.  Medical tests:   EKG independently reviewed: None performed  Test discussed with performing physician:  Yes discussed with Dr. Little-clarified that there was no report of GI bleed on initial encounter  Decision to obtain old records:   Yes  Review and summation of old records:   He has  Active Problems:   Anxiety state   EtOH dependence ([place])   COLONIC POLYPS, HX OF   Infected sebaceous cyst of skin   Hypertensive heart disease without heart failure   Tachycardia   Chest pain on exertion   S/P drug eluting coronary stent placement   Upper GI bleed   Assessment/Plan Likely upper GI bleed causing prerenal azotemia saline 100 cc/H GI consulted, Protonix 40 IV twice daily, n.p.o.?  EGD later today-consider nonselective beta-blocker Note-nondysplastic polyp 2007 colonoscopy-May need to be revisited if no upper source L sided Hematoma of abdomen/pelvis  CT Abd/P r/o Retro-peritoneal hematoma--patient doesn't recall falling  See picture above--if worse in am-might need rpt imaging Not currently on DOAC Hemoglobin ??? 2/2 other etiology as above Pancreatic mass Lipase slightly elevated 64 probably subacute pancreatitis?  ERCP at some point if not resolving History of DES 2018 with CAD Not on aspirin or Plavix secondary to bleeding "I got dark stool every time I take  either 1 of them" At this time holding off on Lasix 40 as well as losartan 100 Continue with a sip of water metoprolol 25 twice daily Ethanol habituation Drinks 1/2 gallon of rum daily-last time he was here he needed Precedex Placed on aggressive CIWA scale, give Librium in the ED 50 mg in addition monitor on stepdown may need to initiate Precedex Patient in addition also has thrombocytopenia to the 30s-he may need FFP and an INR has not been done so I will order one for tomorrow but 1 for today as well We will have to calculate his meld NA after that Morbid obesity BMI is over 40 he will need counseling Tobacco habituation-vapes I have encouraged him not to vape not to use illicit substances and vape as this can cause popcorn lung-he understands    Severity of Illness: The appropriate patient status for this patient is  INPATIENT. Inpatient status is judged to be reasonable and necessary in order to provide the required intensity of service to ensure the patient's safety. The patient's presenting symptoms, physical exam findings, and initial radiographic and laboratory data in the context of their chronic comorbidities is felt to place them at high risk for further clinical deterioration. Furthermore, it is not anticipated that the patient will be medically stable for discharge from the hospital within 2 midnights of admission. The following factors support the patient status of inpatient.   " The patient's presenting symptoms include probable GI bleed. " The worrisome physical exam findings include hematoma. " The initial radiographic and laboratory data are worrisome because of pancreatic mass/pseudocyst. " The chronic co-morbidities include multiple.   * I certify that at the point of admission it is my clinical judgment that the patient will require inpatient hospital care spanning beyond 2 midnights from the point of admission due to high intensity of service, high risk for further  deterioration and high frequency of surveillance required.*  DVT prophylaxis: SCD Code Status: Presumed full he is unclear as to what he would want to happen if he was to get very sick I have encouraged him to think about this Family Communication: None Consults called: GI  Time spent: 51 minutes  Verlon Au, MD Jerl Mina my NP partners at night for Care related issues] [facility] --[employer] [employer] [place] , [email or web address]; password [facility]  03/30/2019, 9:14 AM

## 2019-03-30 NOTE — Consult Note (Addendum)
Referring Provider:  [facility]         Primary Care Physician:  Charlott Rakes, MD Primary Gastroenterologist:   Lucio Edward, MD          Reason for Consultation:  GI bleed                 ASSESSMENT /  PLAN    1. 58 yo male with Etoh abuse presenting with recent isolated episode of hematemesis. He has hepatomegaly but no evidence for cirrhosis on CT AP. Rule out [employer] tear, esophagitis, PUD, gastric varices related to #3.  --Even though no evidence for cirrhosis by imaging he could still have gastric varices from pancreatic lesion. Therefore in setting of GI bleed /  thrombocytopenia will start Octreotide gtt -IV PPI --Will need EGD at some point this admission. No active bleeding at present. Right now he is at risk for Etoh withdrawal is in AKI. --check INR   2. Thrombocytopenia. Don't have a good baseline as platelets usually "clumped". Probably bone marrow suppression from Etoh.   3. Pancreatic lesion on non-contrast CT AP.  --Will need MRI when able to hold breath long enough for exam --CA 19-9  4. Hx of colon polyps. He is overdue for surveillance colonoscopy. Can perform as outpatient.      [facility] Att[facility]   I have taken an interval history, reviewed the chart and examined the patient. I agree with the Advanced Practitioner's note, impression and recommendations.    I spoke to long-term girlfriend Richard Calhoun - patient has been depressed - in past and now again - severe with terrible anorexia. Not eating or drinking other than EtOH. Her brother recently died and their cat was sick or died and problems with house so multiple issues. 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 Need INR but doubt a candidate for steroids.   I think we need to see how things are tomorrow re: EGD - will reassess then. He is not ready for EGD now and not urgently needed. Will  cover w/ octreotide. Should he rebleed low threshold for PLT transfusion.  We need to be very sure he can hold breath x 15 sec before MR done to figure out pancreatic tail lesion.  Hx colon polyps way down on list of issues.  Apparently has failed w/ EtOH rehab multiple times.  Would get psych to see and treat depression if we can once he is detoxed.  Clears today - NPO in AM - will determine if EGD then   Gatha Mayer, MD, [facility] 03/30/2019 11:41 AM Pager [phone]    HPI:     Richard Calhoun is a 58 y.o. male with a PMH remarkable for CAD / stent,  HTN, hyperlipidemia, Etoh abuse, tobacco abuse  Patient presented to ED yesterday with nausea / vomiting and left flank pain. He had apparently had a fall at home at some point. Patient says he has been a heavy drinker most of his life. He drink "a lot" of liquor every day but doesn't quantify. He tried [employer] / outpatient treatment many times in the past.  He reports very poor PO intake at home. Yesterday, her maybe the day before patient cannot remember, he vomited blood. Though he frequently vomits at home this was the first time emesis contained blood and it alarmed him. He denies abdominal pain. No melena, in fact he  hasn't had a BM in 3 days. Patient is apparently supposed to be on daily asa but will not take it as it causes rectal bleeding. He occasionally takes Ibuprofen for headaches but basically his NSAID use is low.  Richard Calhoun gives a hx of GERD and is asymptomatic on daily Prilosec.    Past Medical History:  Diagnosis Date   ALLERGIC RHINITIS    ANXIETY    BICEPS TENDON RUPTURE, RIGHT    CAD S/P percutaneous coronary angioplasty 03/2017   Cath: mRCA 99-100% CTO (calcific w/ L-R collaterals), p-mLAD calcified 60-80% (FFR 0.74 after D2), 50% mCx. --> Staged Atherectomy-DES PCI of p-mLAD (Synergy DES 3.0 x 24 -- 3.3 mm)   DEPENDENCE, ALCOHOL NEC/NOS, UNSPECIFIED    DEPRESSION    GERD     HYPERLIPIDEMIA    HYPERTENSION    Impaired glucose tolerance    OSTEOARTHRITIS    Tubular adenoma of colon 06/2006    Past Surgical History:  Procedure Laterality Date   COLONOSCOPY W/ POLYPECTOMY  06/2006   Dr Fuller Plan.  Fror hematochezia. 5 mm, sessile polyp (tubular adenoma, no HGD).  injected saline to internal hemorrhoids.  Did not respond to fup 2012, 2014 colonoscopy.     CORONARY ATHERECTOMY N/A 04/20/2017   Wellington Hampshire, MD;  p-mLAD (Orbital)   CORONARY STENT INTERVENTION N/A 04/20/2017   Wellington Hampshire, MD;  Location: [facility].  Staged Orbital Atherectomy-DES PCI of p-mLAD (Synergy DES 3.0 x 24 -- 3.3 mm)   ESOPHAGOGASTRODUODENOSCOPY  2000   Dr Laurence Spates.  Hiatal hernia.   FOOT SURGERY     INTRAVASCULAR PRESSURE WIRE/FFR STUDY N/A 04/15/2017   Procedure: INTRAVASCULAR PRESSURE WIRE/FFR STUDY; Belva Crome, MD; LAD = 0.74 after D2   LEFT HEART CATH AND CORONARY ANGIOGRAPHY N/A 04/15/2017   MANDIBLE FRACTURE SURGERY     TRANSTHORACIC ECHOCARDIOGRAM  01/2017   Moderate LVH.  EF 60 to 65%.  No R WMA.  GR 1 DD. Mild AI.     Prior to Admission medications   Medication Sig Start Date End Date Taking? Authorizing Provider  atorvastatin (LIPITOR) 80 MG tablet Take 1 tablet (80 mg total) by mouth daily at 6 PM. 12/28/18  Yes Newlin, Enobong, MD  losartan (COZAAR) 100 MG tablet Take 1 tablet (100 mg total) by mouth daily. 12/28/18  Yes Charlott Rakes, MD  metoprolol tartrate (LOPRESSOR) 25 MG tablet Take 1 tablet (25 mg total) by mouth 2 (two) times daily. 12/28/18  Yes Charlott Rakes, MD  Multiple Vitamins-Minerals (CENTRUM) tablet Take 1 tablet by mouth daily.    Yes [provider]  nitroGLYCERIN (NITROSTAT) 0.4 MG SL tablet Place 1 tablet (0.4 mg total) under the tongue every 5 (five) minutes as needed for chest pain. 11/13/18  Yes Hilty, Nadean Corwin, MD  omeprazole (PRILOSEC OTC) 20 MG tablet Take 1 tablet (20 mg total) by mouth daily. 12/28/18  Yes Charlott Rakes, MD  polycarbophil (FIBERCON) 625 MG tablet Take 625 mg by mouth daily.   Yes [provider]  clotrimazole-betamethasone (LOTRISONE) cream Apply 1 application topically 2 (two) times daily. Patient not taking: Reported on 03/30/2019 11/24/18   Charlott Rakes, MD  doxycycline (VIBRA-TABS) 100 MG tablet Take 1 tablet (100 mg total) by mouth 2 (two) times daily. Patient not taking: Reported on 03/30/2019 11/17/18   Ladell Pier, MD  furosemide (LASIX) 40 MG tablet Take 1 tablet (40 mg total) by mouth daily. 12/28/18 03/28/19  Charlott Rakes, MD    Current Facility-Administered  Medications  Medication Dose Route Frequency Provider Last Rate Last Dose   0.9 %  sodium chloride infusion   Intravenous Continuous Nita Sells, MD       chlordiazePOXIDE (LIBRIUM) capsule 50 mg  50 mg Oral Once Nita Sells, MD       LORazepam (ATIVAN) injection 0-4 mg  0-4 mg Intravenous Q6H Little, Wenda Overland, MD   2 mg at 03/30/19 [id number]   Or   LORazepam (ATIVAN) tablet 0-4 mg  0-4 mg Oral Q6H Little, Wenda Overland, MD       [START ON 04/01/2019] LORazepam (ATIVAN) injection 0-4 mg  0-4 mg Intravenous Q12H Little, Wenda Overland, MD       Or   Derrill Memo ON 04/01/2019] LORazepam (ATIVAN) tablet 0-4 mg  0-4 mg Oral Q12H Little, Wenda Overland, MD       thiamine (VITAMIN B-1) tablet 100 mg  100 mg Oral Daily Little, Wenda Overland, MD       Or   thiamine (B-1) injection 100 mg  100 mg Intravenous Daily Little, Wenda Overland, MD   100 mg at 03/30/19 0946   Current Outpatient Medications  Medication Sig Dispense Refill   atorvastatin (LIPITOR) 80 MG tablet Take 1 tablet (80 mg total) by mouth daily at 6 PM. 30 tablet 2   losartan (COZAAR) 100 MG tablet Take 1 tablet (100 mg total) by mouth daily. 90 tablet 1   metoprolol tartrate (LOPRESSOR) 25 MG tablet Take 1 tablet (25 mg total) by mouth 2 (two) times daily. 60 tablet 2   Multiple Vitamins-Minerals (CENTRUM) tablet Take 1  tablet by mouth daily.      nitroGLYCERIN (NITROSTAT) 0.4 MG SL tablet Place 1 tablet (0.4 mg total) under the tongue every 5 (five) minutes as needed for chest pain. 25 tablet 3   omeprazole (PRILOSEC OTC) 20 MG tablet Take 1 tablet (20 mg total) by mouth daily. 30 tablet 2   polycarbophil (FIBERCON) 625 MG tablet Take 625 mg by mouth daily.     clotrimazole-betamethasone (LOTRISONE) cream Apply 1 application topically 2 (two) times daily. (Patient not taking: Reported on 03/30/2019) 30 g 1   doxycycline (VIBRA-TABS) 100 MG tablet Take 1 tablet (100 mg total) by mouth 2 (two) times daily. (Patient not taking: Reported on 03/30/2019) 14 tablet 0   furosemide (LASIX) 40 MG tablet Take 1 tablet (40 mg total) by mouth daily. 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  Physical Exam: Vital signs in last 24 hours: Temp:  [97 F (36.1 C)-97.9 F (36.6 C)] 97.9 F (36.6 C) (09/11 0657) Pulse Rate:  [72-96] 74 (09/11 0900) Resp:  [15-19] 15 (09/11 0657) BP: (107-126)/(48-68) 119/54 (09/11 0900) SpO2:  [96 %-99 %] 97 % (09/11 0900)   General:   Alert, well-developed,  male in NAD Psych:   cooperative. Normal mood and affect.  Eyes:  Pupils equal, sclera clear, no icterus.   Conjunctiva pink. Ears:  Normal auditory acuity. Nose:  No deformity, discharge,  or lesions. Neck:  Supple; no masses Lungs:  Clear throughout to auscultation.   No wheezes,  crackles, or rhonchi.  Heart:  Regular rate and rhythm; no murmurs, no lower extremity edema Abdomen:  Soft, non-distended, nontender, BS active, no palp mass   Rectal:  Deferred  Msk:  Symmetrical without gross deformities. . Neurologic:  Alert and  oriented x4;  grossly normal neurologically. No asterixis Skin:  Intact without significant lesions or rashes. Bruising of left upper flank    Intake/Output from previous day: No intake/output data recorded. Intake/Output this shift: No intake/output data recorded.  Lab Results: Recent Labs    03/30/19 0442  WBC 8.9  HGB 8.5*  HCT 26.0*  PLT 39*   BMET Recent Labs    03/30/19 0442  NA 139  K 4.9  CL 99  CO2 13*  GLUCOSE 86  BUN 72*  CREATININE 3.67*  CALCIUM 7.5*   LFT Recent Labs    03/30/19 0442  PROT 7.3  ALBUMIN 3.1*  AST 183*  ALT 73*  ALKPHOS 311*  BILITOT 2.6*   PT/INR No results for input(s): LABPROT, INR in the last 72 hours. Hepatitis Panel No results for input(s): HEPBSAG, HCVAB, HEPAIGM, HEPBIGM in the last 72 hours.   . CBC Latest Ref Rng & Units 03/30/2019 08/15/2017 04/21/2017  WBC 4.0 - 10.5 K/uL 8.9 4.2 5.8  Hemoglobin 13.0 - 17.0 g/dL 8.5(L) 11.2(L) 10.8(L)  Hematocrit 39.0 - 52.0 % 26.0(L) 33.8(L) 33.1(L)  Platelets 150 - 400 K/uL 39(L) CANCELED PLATELET CLUMPING, SUGGEST RECOLLECTION OF SAMPLE IN CITRATE TUBE.    Marland Kitchen CMP Latest Ref Rng & Units 03/30/2019 08/26/2017 08/15/2017  Glucose 70 - 99 mg/dL 86 99 100(H)  BUN 6 - 20 mg/dL 72(H) 20 66(H)  Creatinine 0.61 - 1.24 mg/dL 3.67(H) 1.03 1.95(H)  Sodium 135 - 145 mmol/L 139 146(H) 137  Potassium 3.5 - 5.1 mmol/L 4.9 4.1 3.3(L)  Chloride 98 - 111 mmol/L 99 107(H) 96  CO2 22 - 32 mmol/L 13(L) 19(L) 18(L)  Calcium 8.9 - 10.3 mg/dL 7.5(L) 9.8 8.1(L)  Total Protein 6.5 - 8.1 g/dL 7.3 - -  Total Bilirubin 0.3 - 1.2 mg/dL 2.6(H) - -  Alkaline Phos 38 - 126 U/L 311(H) - -  AST 15 - 41 U/L 183(H) - -  ALT 0 - 44 U/L 73(H) - -    Studies/Results: Ct Abdomen Pelvis Wo Contrast  Result Date: 03/30/2019 CLINICAL DATA:  58 year old male with history of blunt abdominal trauma from a fall. Left upper flank and rib pain for the past 2 days. Nausea and vomiting. EXAM: CT CHEST, ABDOMEN AND PELVIS WITHOUT CONTRAST TECHNIQUE: Multidetector CT imaging of the chest, abdomen and pelvis was performed following the standard protocol without IV contrast. COMPARISON:  No priors. FINDINGS: CT CHEST FINDINGS Cardiovascular: Heart size is normal. There is no significant pericardial fluid, thickening or pericardial calcification. There is aortic atherosclerosis, as well as atherosclerosis of the great vessels of the mediastinum and the coronary arteries, including calcified atherosclerotic plaque in the left main, left anterior descending, left circumflex and right coronary arteries. Mediastinum/Nodes: Mildly enlarged subcarinal lymph node measuring 1.5 cm in short axis. No other pathologically enlarged mediastinal or hilar lymph nodes are noted. Please note that accurate exclusion of hilar adenopathy is limited on noncontrast CT scans. Esophagus is unremarkable in appearance. Lungs/Pleura: Area of probable scarring  or atelectasis in the right lower lobe. No confluent consolidative airspace disease. No pleural effusions. No definite suspicious appearing pulmonary nodules or masses are noted. Musculoskeletal: No acute displaced fractures or aggressive appearing lytic or blastic lesions are noted in the visualized portions of the skeleton. CT ABDOMEN PELVIS FINDINGS Hepatobiliary: Liver is enlarged measuring 25.1 cm in craniocaudal span. Severe diffuse low attenuation throughout the hepatic parenchyma, indicative of severe hepatic steatosis. No definite suspicious cystic or solid hepatic lesions are confidently identified on today's noncontrast CT examination. No overt signs of hepatic trauma on today's noncontrast CT examination. Multiple tiny calcified  gallstones lying dependently in the gallbladder, without surrounding inflammatory changes to suggest an acute cholecystitis at this time. Pancreas: In the tail of the pancreas there is a 4.8 x 3.3 cm lesion of heterogeneous internal attenuation ranging from low to intermediate-high attenuation, incompletely characterized on today's noncontrast CT examination. This extends toward the splenic hilum. Head and body of the pancreas are otherwise unremarkable in appearance on today's noncontrast CT examination. Spleen: Spleen appears intact, and otherwise unremarkable in appearance. Adrenals/Urinary Tract: No definite signs of significant acute traumatic injury to either kidney or adrenal gland on today's noncontrast CT examination. Unenhanced appearance of the kidneys and bilateral adrenal glands is unremarkable. No hydroureteronephrosis. Urinary bladder appears intact, and unremarkable. Stomach/Bowel: No definite signs of acute traumatic injury to the hollow viscera. Unenhanced appearance of the stomach is normal. No pathologic dilatation of small bowel or colon. Normal appendix. Vascular/Lymphatic: Aortic atherosclerosis. Multiple prominent borderline enlarged and mildly enlarged retroperitoneal lymph nodes are noted in the abdomen, largest of which is in the aortocaval nodal station (axial image 70 of series 2) measuring 1.3 cm in short axis. Reproductive: Prostate gland and seminal vesicles are unremarkable in appearance. Other: Soft tissue stranding and trace amount of fluid in the retroperitoneum adjacent to the tail of the pancreas tracking slightly caudally. No significant volume of ascites. No pneumoperitoneum. Musculoskeletal: No acute displaced fractures or aggressive appearing lytic or blastic lesions are noted in the visualized portions of the skeleton. IMPRESSION: 1. Unusual appearance of the tail of the pancreas, poorly characterized on today's noncontrast CT examination. This could represent a complex  pancreatic pseudocyst related to pancreatitis, however, the possibility of underlying mass is not entirely excluded. The appearance is less likely related to trauma. Further characterization with pancreatic protocol CT scan or abdominal MRI with and without IV gadolinium with MRCP is recommended to exclude underlying malignancy. 2. No definite signs of significant acute traumatic injury in the chest, abdomen or pelvis. 3. Multiple prominent borderline enlarged and mildly enlarged retroperitoneal lymph nodes and mildly enlarged subcarinal lymph node. This is nonspecific and should be correlated with results of forthcoming abdominal scan to evaluate the pancreas, as metastatic disease is not excluded. 4. Probable post infectious or inflammatory scarring or atelectasis in the right lower lobe. 5. Cholelithiasis without evidence of acute cholecystitis at this time. 6. Hepatomegaly with severe hepatic steatosis. 7. Aortic atherosclerosis, in addition to left main and 3 vessel coronary artery disease. Please note that although the presence of coronary artery calcium documents the presence of coronary artery disease, the severity of this disease and any potential stenosis cannot be assessed on this non-gated CT examination. Assessment for potential risk factor modification, dietary therapy or pharmacologic therapy may be warranted, if clinically indicated. 8. Additional incidental findings, as above. Electronically Signed   By: Vinnie Langton M.D.   On: 03/30/2019 08:19   Ct Chest Wo Contrast  Result  Date: 03/30/2019 CLINICAL DATA:  58 year old male with history of blunt abdominal trauma from a fall. Left upper flank and rib pain for the past 2 days. Nausea and vomiting. EXAM: CT CHEST, ABDOMEN AND PELVIS WITHOUT CONTRAST TECHNIQUE: Multidetector CT imaging of the chest, abdomen and pelvis was performed following the standard protocol without IV contrast. COMPARISON:  No priors. FINDINGS: CT CHEST FINDINGS  Cardiovascular: Heart size is normal. There is no significant pericardial fluid, thickening or pericardial calcification. There is aortic atherosclerosis, as well as atherosclerosis of the great vessels of the mediastinum and the coronary arteries, including calcified atherosclerotic plaque in the left main, left anterior descending, left circumflex and right coronary arteries. Mediastinum/Nodes: Mildly enlarged subcarinal lymph node measuring 1.5 cm in short axis. No other pathologically enlarged mediastinal or hilar lymph nodes are noted. Please note that accurate exclusion of hilar adenopathy is limited on noncontrast CT scans. Esophagus is unremarkable in appearance. Lungs/Pleura: Area of probable scarring or atelectasis in the right lower lobe. No confluent consolidative airspace disease. No pleural effusions. No definite suspicious appearing pulmonary nodules or masses are noted. Musculoskeletal: No acute displaced fractures or aggressive appearing lytic or blastic lesions are noted in the visualized portions of the skeleton. CT ABDOMEN PELVIS FINDINGS Hepatobiliary: Liver is enlarged measuring 25.1 cm in craniocaudal span. Severe diffuse low attenuation throughout the hepatic parenchyma, indicative of severe hepatic steatosis. No definite suspicious cystic or solid hepatic lesions are confidently identified on today's noncontrast CT examination. No overt signs of hepatic trauma on today's noncontrast CT examination. Multiple tiny calcified gallstones lying dependently in the gallbladder, without surrounding inflammatory changes to suggest an acute cholecystitis at this time. Pancreas: In the tail of the pancreas there is a 4.8 x 3.3 cm lesion of heterogeneous internal attenuation ranging from low to intermediate-high attenuation, incompletely characterized on today's noncontrast CT examination. This extends toward the splenic hilum. Head and body of the pancreas are otherwise unremarkable in appearance on  today's noncontrast CT examination. Spleen: Spleen appears intact, and otherwise unremarkable in appearance. Adrenals/Urinary Tract: No definite signs of significant acute traumatic injury to either kidney or adrenal gland on today's noncontrast CT examination. Unenhanced appearance of the kidneys and bilateral adrenal glands is unremarkable. No hydroureteronephrosis. Urinary bladder appears intact, and unremarkable. Stomach/Bowel: No definite signs of acute traumatic injury to the hollow viscera. Unenhanced appearance of the stomach is normal. No pathologic dilatation of small bowel or colon. Normal appendix. Vascular/Lymphatic: Aortic atherosclerosis. Multiple prominent borderline enlarged and mildly enlarged retroperitoneal lymph nodes are noted in the abdomen, largest of which is in the aortocaval nodal station (axial image 70 of series 2) measuring 1.3 cm in short axis. Reproductive: Prostate gland and seminal vesicles are unremarkable in appearance. Other: Soft tissue stranding and trace amount of fluid in the retroperitoneum adjacent to the tail of the pancreas tracking slightly caudally. No significant volume of ascites. No pneumoperitoneum. Musculoskeletal: No acute displaced fractures or aggressive appearing lytic or blastic lesions are noted in the visualized portions of the skeleton. IMPRESSION: 1. Unusual appearance of the tail of the pancreas, poorly characterized on today's noncontrast CT examination. This could represent a complex pancreatic pseudocyst related to pancreatitis, however, the possibility of underlying mass is not entirely excluded. The appearance is less likely related to trauma. Further characterization with pancreatic protocol CT scan or abdominal MRI with and without IV gadolinium with MRCP is recommended to exclude underlying malignancy. 2. No definite signs of significant acute traumatic injury in the chest, abdomen or pelvis.  3. Multiple prominent borderline enlarged and mildly  enlarged retroperitoneal lymph nodes and mildly enlarged subcarinal lymph node. This is nonspecific and should be correlated with results of forthcoming abdominal scan to evaluate the pancreas, as metastatic disease is not excluded. 4. Probable post infectious or inflammatory scarring or atelectasis in the right lower lobe. 5. Cholelithiasis without evidence of acute cholecystitis at this time. 6. Hepatomegaly with severe hepatic steatosis. 7. Aortic atherosclerosis, in addition to left main and 3 vessel coronary artery disease. Please note that although the presence of coronary artery calcium documents the presence of coronary artery disease, the severity of this disease and any potential stenosis cannot be assessed on this non-gated CT examination. Assessment for potential risk factor modification, dietary therapy or pharmacologic therapy may be warranted, if clinically indicated. 8. Additional incidental findings, as above. Electronically Signed   By: Vinnie Langton M.D.   On: 03/30/2019 08:19    Active Problems:   Anxiety state   EtOH dependence (HCC)   COLONIC POLYPS, HX OF   Infected sebaceous cyst of skin   Hypertensive heart disease without heart failure   Tachycardia   Chest pain on exertion   S/P drug eluting coronary stent placement   Upper GI bleed    Tye Savoy, NP-C @  03/30/2019, 10:54 AM

## 2019-03-30 NOTE — ED Provider Notes (Signed)
I received pt in signout from Dr. Leonette Monarch.  Briefly, he had presented with abdominal pain in setting of chronic alcoholism and his lab work had been notable for acute renal failure, three-point hemoglobin drop since last hemoglobin, and mildly elevated LFTs.  At time of signout, awaiting CT scan and admission to hospitalist.  1 L IV fluid bolus had been ordered prior to signout.  CT shows abnormality at tail of pancreas, complex pancreatic pseudocyst versus possible underlying mass.  Some enlarged lymph nodes of unclear significance.  Recommendation of further advanced imaging of pancreas.  No evidence of acute trauma.  Placed patient on CIWA protocol.  I discussed admission with [facility] hospitalist, Dr. Verlon Au. Per his request, discussed with trauma surgery PA Janett Billow who advised no further imaging is needed to work-up the possibility of intra-abdominal trauma.  Patient admitted to medicine for further treatment.   Little, Wenda Overland, MD 03/30/19 1325

## 2019-03-31 ENCOUNTER — Encounter (HOSPITAL_COMMUNITY): Payer: Self-pay | Admitting: Gastroenterology

## 2019-03-31 DIAGNOSIS — N179 Acute kidney failure, unspecified: Secondary | ICD-10-CM

## 2019-03-31 DIAGNOSIS — F411 Generalized anxiety disorder: Secondary | ICD-10-CM

## 2019-03-31 DIAGNOSIS — K7010 Alcoholic hepatitis without ascites: Secondary | ICD-10-CM

## 2019-03-31 LAB — COMPREHENSIVE METABOLIC PANEL
ALT: 76 U/L — ABNORMAL HIGH (ref 0–44)
AST: 187 U/L — ABNORMAL HIGH (ref 15–41)
Albumin: 2.8 g/dL — ABNORMAL LOW (ref 3.5–5.0)
Alkaline Phosphatase: 311 U/L — ABNORMAL HIGH (ref 38–126)
Anion gap: 26 — ABNORMAL HIGH (ref 5–15)
BUN: 64 mg/dL — ABNORMAL HIGH (ref 6–20)
CO2: 8 mmol/L — ABNORMAL LOW (ref 22–32)
Calcium: 6.8 mg/dL — ABNORMAL LOW (ref 8.9–10.3)
Chloride: 106 mmol/L (ref 98–111)
Creatinine, Ser: 2.39 mg/dL — ABNORMAL HIGH (ref 0.61–1.24)
GFR calc Af Amer: 33 mL/min — ABNORMAL LOW (ref >60)
GFR calc non Af Amer: 28 mL/min — ABNORMAL LOW (ref >60)
Glucose, Bld: 102 mg/dL — ABNORMAL HIGH (ref 70–99)
Potassium: 4.9 mmol/L (ref 3.5–5.1)
Sodium: 140 mmol/L (ref 135–145)
Total Bilirubin: 4.4 mg/dL — ABNORMAL HIGH (ref 0.3–1.2)
Total Protein: 7.1 g/dL (ref 6.5–8.1)

## 2019-03-31 LAB — BLOOD GAS, ARTERIAL
Drawn by: 11249
FIO2: 100
O2 Saturation: 98.7 %
Patient temperature: 98.8
pH, Arterial: 7.25 — ABNORMAL LOW (ref 7.350–7.450)
pO2, Arterial: 187 mmHg — ABNORMAL HIGH (ref 83.0–108.0)

## 2019-03-31 LAB — CBC
HCT: 26.3 % — ABNORMAL LOW (ref 39.0–52.0)
Hemoglobin: 8 g/dL — ABNORMAL LOW (ref 13.0–17.0)
MCH: 37.4 pg — ABNORMAL HIGH (ref 26.0–34.0)
MCHC: 30.4 g/dL (ref 30.0–36.0)
MCV: 122.9 fL — ABNORMAL HIGH (ref 80.0–100.0)
Platelets: 27 10*3/uL — CL (ref 150–400)
RBC: 2.14 MIL/uL — ABNORMAL LOW (ref 4.22–5.81)
RDW: 16.1 % — ABNORMAL HIGH (ref 11.5–15.5)
WBC: 6.9 10*3/uL (ref 4.0–10.5)
nRBC: 0 % (ref 0.0–0.2)

## 2019-03-31 LAB — PROTIME-INR
INR: 1.5 — ABNORMAL HIGH (ref 0.8–1.2)
Prothrombin Time: 17.5 seconds — ABNORMAL HIGH (ref 11.4–15.2)

## 2019-03-31 LAB — CANCER ANTIGEN 19-9: CA 19-9: 20 U/mL (ref 0–35)

## 2019-03-31 LAB — HIV ANTIBODY (ROUTINE TESTING W REFLEX): HIV Screen 4th Generation wRfx: NONREACTIVE

## 2019-03-31 LAB — GLUCOSE, CAPILLARY: Glucose-Capillary: 181 mg/dL — ABNORMAL HIGH (ref 70–99)

## 2019-03-31 MED ORDER — METOPROLOL TARTRATE 5 MG/5ML IV SOLN
5.0000 mg | INTRAVENOUS | Status: DC | PRN
  Administered 2019-03-31 – 2019-04-01 (×2): 5 mg via INTRAVENOUS
  Filled 2019-03-31 (×2): qty 5

## 2019-03-31 MED ORDER — PHYTONADIONE 5 MG PO TABS
10.0000 mg | ORAL_TABLET | Freq: Once | ORAL | Status: DC
  Filled 2019-03-31: qty 2

## 2019-03-31 MED ORDER — VITAMIN K1 10 MG/ML IJ SOLN
5.0000 mg | Freq: Once | INTRAVENOUS | Status: AC
  Administered 2019-03-31: 13:00:00 5 mg via INTRAVENOUS
  Filled 2019-03-31: qty 0.5

## 2019-03-31 NOTE — Progress Notes (Signed)
Pt co of left flank pain 8/10 ache like he did when he arrived at [facility] ED.  NP A Zierle-Ghosh notified.  Pain med ordered if needed.  When RN returned to room pt had already fallen asleep.  Will continue to monitor closely.

## 2019-03-31 NOTE — Progress Notes (Signed)
Critical platelet level 27,000 paged to A. Zierle-Ghosh NP.

## 2019-03-31 NOTE — Progress Notes (Signed)
PROGRESS NOTE    Richard Calhoun  [id number] DOB: 1961/03/18 DOA: 03/30/2019 PCP: Charlott Rakes, MD    Brief Narrative:58 white male community dwelling known chronic ethanol habituation half-gallon RuM daily, chronic tobacco use, CAD with subtotal CTO + RCA atherectomy and PCI of LAD-unstable angina 2018 status post cath staged atherectomy with drug-eluting stent placement at that time Chronic heart failure, hyperlipidemia, mild peripheral vascular disease, paroxysmal tachycardia  At the time of his admission in 2018 when he had a stent placement he needed Precedex  Comes into the hospital over the past several days with increasing malaise-actually vomited a lot of bright red blood yesterday he states it was bright red in consistency and has not eaten in the past 3 days He does occasionally vape and used to be a heavy smoker previous to this He continues to drink alcohol He cannot recall any falls  Because of diagnostic uncertainty on the part of the emergency room physician a CT abdomen pelvis was done--- it appears that they did not think that this was a trauma case therefore trauma series was not performed--- I am obtaining a secondhand report from Dr. Rex Kras as the attending physician who saw the patient initially has left This showed the below findings  CT of the abdomen and pelvis-Unusual appearance of the tail of the pancreas, poorly characterized on today's noncontrast CT examination. This could represent a complex pancreatic pseudocyst related to pancreatitis, however, the possibility of underlying mass is not entirely excluded. The appearance is less likely related to trauma. Further characterization with pancreatic protocol CT scan or abdominal MRI with and without IV gadolinium with MRCP is recommended to exclude underlying malignancy. No definite signs of significant acute traumatic injury in the chest, abdomen or pelvis.  Multiple prominent borderline enlarged and  mildly enlarged retroperitoneal lymph nodes and mildly enlarged subcarinal lymph node. This is nonspecific and should be correlated with results of forthcoming abdominal scan to evaluate the pancreas, as metastatic disease is not excluded.  Probable post infectious or inflammatory scarring or atelectasis in the right lower lobe.Cholelithiasis without evidence of acute cholecystitis at this time.  Hepatomegaly with severe hepatic steatosis.  Aortic atherosclerosis, in addition to left main and 3 vessel coronary artery disease. Please note that although the presence of coronary artery calcium documents the presence of coronary artery disease, the severity of this disease and any potential stenosis cannot be assessed on this non-gated CT examination. Assessment for potential risk factor modification, dietary therapy or pharmacologic therapy may be warranted, if clinically indicated. Assessment & Plan:   Active Problems:   Anxiety state   EtOH dependence (HCC)   COLONIC POLYPS, HX OF   Infected sebaceous cyst of skin   Hypertensive heart disease without heart failure   Tachycardia   Chest pain on exertion   S/P drug eluting coronary stent placement   Upper GI bleed   Acute renal failure (HCC)   Alcoholic hepatitis without ascites   #1 hematemesis-in the setting of alcohol abuse and thrombocytopenia.  Patient has not had any further hematemesis or melena since admission.  He has been treated with IV Protonix and octreotide and has been n.p.o.  GI recommends clear liquid diet.  INR elevated at 1.5 we will give a dose of vitamin K.  Will consider FFP if he has any further active bleeding.  #2 AKI his creatinine has come down to 2.39 from 2.88 with IV hydration.  #3 pancreatic tail lesion?  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  Patient will need further work-up with an MRI of the abdomen to evaluate this.   Will wait for renal functions to get better.  #4 abnormal LFTs probably secondary to alcohol hepatitis AST more than ALT improving.  Alkaline phosphatase improving.  Total bilirubin slightly higher than yesterday at 4.4.  His INR is 1.5.  #5 severe thrombocytopenia likely related to alcohol use.  Will transfuse for less than 10,000 or if he has active bleeding.  His platelets today is 27 down from 34 yesterday.  #6 CAD/DES in 2018 not on anticoagulation due to GI bleed  #7 alcohol abuse continue CIWA scale.  Encouraged abstinence.  Patient has history of depression.  Patient has failed alcohol rehab in the past.  #8 tobacco abuse encouraged not to smoke     Estimated body mass index is 28.12 kg/m as calculated from the following:   Height as of this encounter: 5\' 10"  (1.778 m).   Weight as of this encounter: 88.9 kg.  DVT prophylaxis: SCD due to GI bleed  code Status full code  family Communication: None  disposition Plan: Pending clinical improvement Consultants:   GI  Procedures: None Antimicrobials: None Subjective: Patient is awake he knows he is in [facility] wanting to get out of bed staff reports no further vomiting or diarrhea melena or hematemesis.   Objective: Vitals:   03/31/19 0600 03/31/19 0752 03/31/19 0800 03/31/19 0942  BP: 131/77   121/81  Pulse: 92 (!) 128  (!) 120  Resp:      Temp:   98.8 F (37.1 C)   TempSrc:   Oral   SpO2:      Weight:      Height:        Intake/Output Summary (Last 24 hours) at 03/31/2019 1139 Last data filed at 03/31/2019 0943 Gross per 24 hour  Intake 2296.54 ml  Output 3000 ml  Net -703.46 ml   Filed Weights   03/30/19 1800  Weight: 88.9 kg    Examination:  General exam: Appears calm and comfortable  Respiratory system: Clear to auscultation. Respiratory effort normal. Cardiovascular system: S1 & S2 heard, RRR. No JVD, murmurs, rubs, gallops or clicks. No pedal edema. Gastrointestinal system: Abdomen is  nondistended, soft and nontender. No organomegaly or masses felt. Normal bowel sounds heard.  Hematoma left-sided Central nervous system: Alert and oriented. No focal neurological deficits. Extremities: Symmetric 5 x 5 power. Skin: No rashes, lesions or ulcers Psychiatry: Judgement and insight appear normal. Mood & affect appropriate.     Data Reviewed: I have personally reviewed following labs and imaging studies  CBC: Recent Labs  Lab 03/30/19 0442 03/30/19 1600 03/31/19 0202  WBC 8.9 8.5 6.9  HGB 8.5* 8.6* 8.0*  HCT 26.0* 26.7* 26.3*  MCV 116.6* 117.6* 122.9*  PLT 39* 34* 27*   Basic Metabolic Panel: Recent Labs  Lab 03/30/19 0442 03/30/19 1331 03/31/19 0202  NA 139 141 140  K 4.9 5.2* 4.9  CL 99 103 106  CO2 13* 12* 8*  GLUCOSE 86 78 102*  BUN 72* 68* 64*  CREATININE 3.67* 2.88* 2.39*  CALCIUM 7.5* 7.0* 6.8*   GFR: Estimated Creatinine Clearance: 36.9 mL/min (A) (by C-G formula based on SCr of 2.39 mg/dL (H)). Liver Function Tests: Recent Labs  Lab 03/30/19 0442 03/30/19 1331 03/31/19 0202  AST 183* 190* 187*  ALT 73* 72* 76*  ALKPHOS 311* 315* 311*  BILITOT 2.6* 3.1* 4.4*  PROT 7.3 7.1 7.1  ALBUMIN 3.1* 2.9* 2.8*   Recent Labs  Lab 03/30/19 0442  LIPASE 64*   No results for input(s): AMMONIA in the last 168 hours. Coagulation Profile: Recent Labs  Lab 03/30/19 1331 03/31/19 0202  INR 1.3* 1.5*   Cardiac Enzymes: No results for input(s): CKTOTAL, CKMB, CKMBINDEX, TROPONINI in the last 168 hours. BNP (last 3 results) No results for input(s): PROBNP in the last 8760 hours. HbA1C: No results for input(s): HGBA1C in the last 72 hours. CBG: No results for input(s): GLUCAP in the last 168 hours. Lipid Profile: No results for input(s): CHOL, HDL, LDLCALC, TRIG, CHOLHDL, LDLDIRECT in the last 72 hours. Thyroid Function Tests: No results for input(s): TSH, T4TOTAL, FREET4, T3FREE, THYROIDAB in the last 72 hours. Anemia Panel: No results for  input(s): VITAMINB12, FOLATE, FERRITIN, TIBC, IRON, RETICCTPCT in the last 72 hours. Sepsis Labs: No results for input(s): PROCALCITON, LATICACIDVEN in the last 168 hours.  Recent Results (from the past 240 hour(s))  SARS Coronavirus 2 [facility] order, Performed in [facility] hospital lab) Nasopharyngeal Nasopharyngeal Swab     Status: None   Collection Time: 03/30/19  9:47 AM   Specimen: Nasopharyngeal Swab  Result Value Ref Range Status   SARS Coronavirus 2 NEGATIVE NEGATIVE Final    Comment: (NOTE) If result is NEGATIVE SARS-CoV-2 target nucleic acids are NOT DETECTED. The SARS-CoV-2 RNA is generally detectable in upper and lower  respiratory specimens during the acute phase of infection. The lowest  concentration of SARS-CoV-2 viral copies this assay can detect is 250  copies / mL. A negative result does not preclude SARS-CoV-2 infection  and should not be used as the sole basis for treatment or other  patient management decisions.  A negative result may occur with  improper specimen collection / handling, submission of specimen other  than nasopharyngeal swab, presence of viral mutation(s) within the  areas targeted by this assay, and inadequate number of viral copies  (<250 copies / mL). A negative result must be combined with clinical  observations, patient history, and epidemiological information. If result is POSITIVE SARS-CoV-2 target nucleic acids are DETECTED. The SARS-CoV-2 RNA is generally detectable in upper and lower  respiratory specimens dur ing the acute phase of infection.  Positive  results are indicative of active infection with SARS-CoV-2.  Clinical  correlation with patient history and other diagnostic information is  necessary to determine patient infection status.  Positive results do  not rule out bacterial infection or co-infection with other viruses. If result is PRESUMPTIVE POSTIVE SARS-CoV-2 nucleic acids MAY BE PRESENT.   A presumptive positive  result was obtained on the submitted specimen  and confirmed on repeat testing.  While 2019 novel coronavirus  (SARS-CoV-2) nucleic acids may be present in the submitted sample  additional confirmatory testing may be necessary for epidemiological  and / or clinical management purposes  to differentiate between  SARS-CoV-2 and other Sarbecovirus currently known to infect humans.  If clinically indicated additional testing with an alternate test  methodology [phone]) is advised. The SARS-CoV-2 RNA is generally  detectable in upper and lower respiratory sp ecimens during the acute  phase of infection. The expected result is Negative. Fact Sheet for Patients:  StrictlyIdeas.no Fact Sheet for Healthcare Providers: BankingDealers.co.za This test is not yet approved or cleared by the [place] FDA and has been authorized for detection and/or diagnosis of SARS-CoV-2 by FDA under an Emergency Use Authorization (EUA).  This EUA will remain in effect (  meaning this test can be used) for the duration of the COVID-19 declaration under Section 564(b)(1) of the Act, 21 U.S.C. section 360bbb-3(b)(1), unless the authorization is terminated or revoked sooner. Performed at [facility], [place] [address], [place], [place] [address]   MRSA PCR Screening     Status: None   Collection Time: 03/30/19  5:55 PM   Specimen: Nasal Mucosa; Nasopharyngeal  Result Value Ref Range Status   MRSA by PCR NEGATIVE NEGATIVE Final    Comment:        The GeneXpert MRSA Assay (FDA approved for NASAL specimens only), is one component of a comprehensive MRSA colonization surveillance program. It is not intended to diagnose MRSA infection nor to guide or monitor treatment for MRSA infections. Performed at [facility], [place] [address], [place], [place] [address]          Radiology Studies: Ct Abdomen Pelvis Wo  Contrast  Result Date: 03/30/2019 CLINICAL DATA:  58 year old male with history of blunt abdominal trauma from a fall. Left upper flank and rib pain for the past 2 days. Nausea and vomiting. EXAM: CT CHEST, ABDOMEN AND PELVIS WITHOUT CONTRAST TECHNIQUE: Multidetector CT imaging of the chest, abdomen and pelvis was performed following the standard protocol without IV contrast. COMPARISON:  No priors. FINDINGS: CT CHEST FINDINGS Cardiovascular: Heart size is normal. There is no significant pericardial fluid, thickening or pericardial calcification. There is aortic atherosclerosis, as well as atherosclerosis of the great vessels of the mediastinum and the coronary arteries, including calcified atherosclerotic plaque in the left main, left anterior descending, left circumflex and right coronary arteries. Mediastinum/Nodes: Mildly enlarged subcarinal lymph node measuring 1.5 cm in short axis. No other pathologically enlarged mediastinal or hilar lymph nodes are noted. Please note that accurate exclusion of hilar adenopathy is limited on noncontrast CT scans. Esophagus is unremarkable in appearance. Lungs/Pleura: Area of probable scarring or atelectasis in the right lower lobe. No confluent consolidative airspace disease. No pleural effusions. No definite suspicious appearing pulmonary nodules or masses are noted. Musculoskeletal: No acute displaced fractures or aggressive appearing lytic or blastic lesions are noted in the visualized portions of the skeleton. CT ABDOMEN PELVIS FINDINGS Hepatobiliary: Liver is enlarged measuring 25.1 cm in craniocaudal span. Severe diffuse low attenuation throughout the hepatic parenchyma, indicative of severe hepatic steatosis. No definite suspicious cystic or solid hepatic lesions are confidently identified on today's noncontrast CT examination. No overt signs of hepatic trauma on today's noncontrast CT examination. Multiple tiny calcified gallstones lying dependently in the  gallbladder, without surrounding inflammatory changes to suggest an acute cholecystitis at this time. Pancreas: In the tail of the pancreas there is a 4.8 x 3.3 cm lesion of heterogeneous internal attenuation ranging from low to intermediate-high attenuation, incompletely characterized on today's noncontrast CT examination. This extends toward the splenic hilum. Head and body of the pancreas are otherwise unremarkable in appearance on today's noncontrast CT examination. Spleen: Spleen appears intact, and otherwise unremarkable in appearance. Adrenals/Urinary Tract: No definite signs of significant acute traumatic injury to either kidney or adrenal gland on today's noncontrast CT examination. Unenhanced appearance of the kidneys and bilateral adrenal glands is unremarkable. No hydroureteronephrosis. Urinary bladder appears intact, and unremarkable. Stomach/Bowel: No definite signs of acute traumatic injury to the hollow viscera. Unenhanced appearance of the stomach is normal. No pathologic dilatation of small bowel or colon. Normal appendix. Vascular/Lymphatic: Aortic atherosclerosis. Multiple prominent borderline enlarged and mildly enlarged retroperitoneal lymph nodes are noted in the abdomen, largest of  which is in the aortocaval nodal station (axial image 70 of series 2) measuring 1.3 cm in short axis. Reproductive: Prostate gland and seminal vesicles are unremarkable in appearance. Other: Soft tissue stranding and trace amount of fluid in the retroperitoneum adjacent to the tail of the pancreas tracking slightly caudally. No significant volume of ascites. No pneumoperitoneum. Musculoskeletal: No acute displaced fractures or aggressive appearing lytic or blastic lesions are noted in the visualized portions of the skeleton. IMPRESSION: 1. Unusual appearance of the tail of the pancreas, poorly characterized on today's noncontrast CT examination. This could represent a complex pancreatic pseudocyst related to  pancreatitis, however, the possibility of underlying mass is not entirely excluded. The appearance is less likely related to trauma. Further characterization with pancreatic protocol CT scan or abdominal MRI with and without IV gadolinium with MRCP is recommended to exclude underlying malignancy. 2. No definite signs of significant acute traumatic injury in the chest, abdomen or pelvis. 3. Multiple prominent borderline enlarged and mildly enlarged retroperitoneal lymph nodes and mildly enlarged subcarinal lymph node. This is nonspecific and should be correlated with results of forthcoming abdominal scan to evaluate the pancreas, as metastatic disease is not excluded. 4. Probable post infectious or inflammatory scarring or atelectasis in the right lower lobe. 5. Cholelithiasis without evidence of acute cholecystitis at this time. 6. Hepatomegaly with severe hepatic steatosis. 7. Aortic atherosclerosis, in addition to left main and 3 vessel coronary artery disease. Please note that although the presence of coronary artery calcium documents the presence of coronary artery disease, the severity of this disease and any potential stenosis cannot be assessed on this non-gated CT examination. Assessment for potential risk factor modification, dietary therapy or pharmacologic therapy may be warranted, if clinically indicated. 8. Additional incidental findings, as above. Electronically Signed   By: Vinnie Langton M.D.   On: 03/30/2019 08:19   Ct Chest Wo Contrast  Result Date: 03/30/2019 CLINICAL DATA:  58 year old male with history of blunt abdominal trauma from a fall. Left upper flank and rib pain for the past 2 days. Nausea and vomiting. EXAM: CT CHEST, ABDOMEN AND PELVIS WITHOUT CONTRAST TECHNIQUE: Multidetector CT imaging of the chest, abdomen and pelvis was performed following the standard protocol without IV contrast. COMPARISON:  No priors. FINDINGS: CT CHEST FINDINGS Cardiovascular: Heart size is normal. There  is no significant pericardial fluid, thickening or pericardial calcification. There is aortic atherosclerosis, as well as atherosclerosis of the great vessels of the mediastinum and the coronary arteries, including calcified atherosclerotic plaque in the left main, left anterior descending, left circumflex and right coronary arteries. Mediastinum/Nodes: Mildly enlarged subcarinal lymph node measuring 1.5 cm in short axis. No other pathologically enlarged mediastinal or hilar lymph nodes are noted. Please note that accurate exclusion of hilar adenopathy is limited on noncontrast CT scans. Esophagus is unremarkable in appearance. Lungs/Pleura: Area of probable scarring or atelectasis in the right lower lobe. No confluent consolidative airspace disease. No pleural effusions. No definite suspicious appearing pulmonary nodules or masses are noted. Musculoskeletal: No acute displaced fractures or aggressive appearing lytic or blastic lesions are noted in the visualized portions of the skeleton. CT ABDOMEN PELVIS FINDINGS Hepatobiliary: Liver is enlarged measuring 25.1 cm in craniocaudal span. Severe diffuse low attenuation throughout the hepatic parenchyma, indicative of severe hepatic steatosis. No definite suspicious cystic or solid hepatic lesions are confidently identified on today's noncontrast CT examination. No overt signs of hepatic trauma on today's noncontrast CT examination. Multiple tiny calcified gallstones lying dependently in the gallbladder, without  surrounding inflammatory changes to suggest an acute cholecystitis at this time. Pancreas: In the tail of the pancreas there is a 4.8 x 3.3 cm lesion of heterogeneous internal attenuation ranging from low to intermediate-high attenuation, incompletely characterized on today's noncontrast CT examination. This extends toward the splenic hilum. Head and body of the pancreas are otherwise unremarkable in appearance on today's noncontrast CT examination. Spleen:  Spleen appears intact, and otherwise unremarkable in appearance. Adrenals/Urinary Tract: No definite signs of significant acute traumatic injury to either kidney or adrenal gland on today's noncontrast CT examination. Unenhanced appearance of the kidneys and bilateral adrenal glands is unremarkable. No hydroureteronephrosis. Urinary bladder appears intact, and unremarkable. Stomach/Bowel: No definite signs of acute traumatic injury to the hollow viscera. Unenhanced appearance of the stomach is normal. No pathologic dilatation of small bowel or colon. Normal appendix. Vascular/Lymphatic: Aortic atherosclerosis. Multiple prominent borderline enlarged and mildly enlarged retroperitoneal lymph nodes are noted in the abdomen, largest of which is in the aortocaval nodal station (axial image 70 of series 2) measuring 1.3 cm in short axis. Reproductive: Prostate gland and seminal vesicles are unremarkable in appearance. Other: Soft tissue stranding and trace amount of fluid in the retroperitoneum adjacent to the tail of the pancreas tracking slightly caudally. No significant volume of ascites. No pneumoperitoneum. Musculoskeletal: No acute displaced fractures or aggressive appearing lytic or blastic lesions are noted in the visualized portions of the skeleton. IMPRESSION: 1. Unusual appearance of the tail of the pancreas, poorly characterized on today's noncontrast CT examination. This could represent a complex pancreatic pseudocyst related to pancreatitis, however, the possibility of underlying mass is not entirely excluded. The appearance is less likely related to trauma. Further characterization with pancreatic protocol CT scan or abdominal MRI with and without IV gadolinium with MRCP is recommended to exclude underlying malignancy. 2. No definite signs of significant acute traumatic injury in the chest, abdomen or pelvis. 3. Multiple prominent borderline enlarged and mildly enlarged retroperitoneal lymph nodes and  mildly enlarged subcarinal lymph node. This is nonspecific and should be correlated with results of forthcoming abdominal scan to evaluate the pancreas, as metastatic disease is not excluded. 4. Probable post infectious or inflammatory scarring or atelectasis in the right lower lobe. 5. Cholelithiasis without evidence of acute cholecystitis at this time. 6. Hepatomegaly with severe hepatic steatosis. 7. Aortic atherosclerosis, in addition to left main and 3 vessel coronary artery disease. Please note that although the presence of coronary artery calcium documents the presence of coronary artery disease, the severity of this disease and any potential stenosis cannot be assessed on this non-gated CT examination. Assessment for potential risk factor modification, dietary therapy or pharmacologic therapy may be warranted, if clinically indicated. 8. Additional incidental findings, as above. Electronically Signed   By: Vinnie Langton M.D.   On: 03/30/2019 08:19        Scheduled Meds:  Chlorhexidine Gluconate Cloth  6 each Topical Daily   [START ON 04/01/2019] LORazepam  0-4 mg Intravenous Q12H   Or   [START ON 04/01/2019] LORazepam  0-4 mg Oral Q12H   LORazepam  0-4 mg Intravenous Q6H   Or   LORazepam  0-4 mg Oral Q6H   mouth rinse  15 mL Mouth Rinse BID   metoprolol tartrate  25 mg Oral BID   pantoprazole (PROTONIX) IV  40 mg Intravenous Q12H   phytonadione  10 mg Oral Once   sodium chloride flush  3 mL Intravenous Q12H   thiamine  100 mg Oral Daily  Or   thiamine  100 mg Intravenous Daily   Continuous Infusions:  sodium chloride 100 mL/hr at 03/31/19 0940   octreotide  (SANDOSTATIN)    IV infusion 50 mcg/hr (03/31/19 0600)     LOS: 1 day     Georgette Shell, MD [facility]  If 7PM-7AM, please contact night-coverage [email or web address] Password [facility] 03/31/2019, 11:39 AM

## 2019-03-31 NOTE — Progress Notes (Signed)
Progress Note   Subjective  Patient lethargic, responding to some questions although confused, does not know what day it is or what brought him to the hospital. Per nursing he has not had any blood per rectum or more vomiting. Hgb stable. Platelet count very low at 27K. Given some ativan overnight for concern of possible withdrawal.    Objective   Vital signs in last 24 hours: Temp:  [97.9 F (36.6 C)-98.4 F (36.9 C)] 97.9 F (36.6 C) (09/12 0338) Pulse Rate:  [74-128] 128 (09/12 0752) Resp:  [11-25] 20 (09/12 0500) BP: (101-165)/(51-89) 131/77 (09/12 0600) SpO2:  [95 %-100 %] 100 % (09/12 0500) Weight:  [88.9 kg] 88.9 kg (09/11 1800) Last BM Date: 03/31/19 General:    white male in NAD, jaundiced Heart:  Tachycardic, regular Lungs: Respirations even and unlabored, lungs CTA bilaterally Abdomen:  Soft, nontender, mildly distended, hepatomegaly  Extremities:  Without edema. Neurologic:  Alert, slow to respond, not accurate with some answers. Asking to go home, does not known why he is here Psych:  Cooperative. Normal mood and affect.  Intake/Output from previous day: 09/11 0701 - 09/12 0700 In: 2293.5 [P.O.:240; I.V.:2053.5] Out: 2000 [Urine:2000] Intake/Output this shift: Total I/O In: -  Out: 1000 [Urine:1000]  Lab Results: Recent Labs    03/30/19 0442 03/30/19 1600 03/31/19 0202  WBC 8.9 8.5 6.9  HGB 8.5* 8.6* 8.0*  HCT 26.0* 26.7* 26.3*  PLT 39* 34* 27*   BMET Recent Labs    03/30/19 0442 03/30/19 1331 03/31/19 0202  NA 139 141 140  K 4.9 5.2* 4.9  CL 99 103 106  CO2 13* 12* 8*  GLUCOSE 86 78 102*  BUN 72* 68* 64*  CREATININE 3.67* 2.88* 2.39*  CALCIUM 7.5* 7.0* 6.8*   LFT Recent Labs    03/31/19 0202  PROT 7.1  ALBUMIN 2.8*  AST 187*  ALT 76*  ALKPHOS 311*  BILITOT 4.4*   PT/INR Recent Labs    03/30/19 1331 03/31/19 0202  LABPROT 16.5* 17.5*  INR 1.3* 1.5*    Studies/Results: Ct Abdomen Pelvis Wo Contrast  Result Date:  03/30/2019 CLINICAL DATA:  58 year old male with history of blunt abdominal trauma from a fall. Left upper flank and rib pain for the past 2 days. Nausea and vomiting. EXAM: CT CHEST, ABDOMEN AND PELVIS WITHOUT CONTRAST TECHNIQUE: Multidetector CT imaging of the chest, abdomen and pelvis was performed following the standard protocol without IV contrast. COMPARISON:  No priors. FINDINGS: CT CHEST FINDINGS Cardiovascular: Heart size is normal. There is no significant pericardial fluid, thickening or pericardial calcification. There is aortic atherosclerosis, as well as atherosclerosis of the great vessels of the mediastinum and the coronary arteries, including calcified atherosclerotic plaque in the left main, left anterior descending, left circumflex and right coronary arteries. Mediastinum/Nodes: Mildly enlarged subcarinal lymph node measuring 1.5 cm in short axis. No other pathologically enlarged mediastinal or hilar lymph nodes are noted. Please note that accurate exclusion of hilar adenopathy is limited on noncontrast CT scans. Esophagus is unremarkable in appearance. Lungs/Pleura: Area of probable scarring or atelectasis in the right lower lobe. No confluent consolidative airspace disease. No pleural effusions. No definite suspicious appearing pulmonary nodules or masses are noted. Musculoskeletal: No acute displaced fractures or aggressive appearing lytic or blastic lesions are noted in the visualized portions of the skeleton. CT ABDOMEN PELVIS FINDINGS Hepatobiliary: Liver is enlarged measuring 25.1 cm in craniocaudal span. Severe diffuse low attenuation throughout the hepatic parenchyma, indicative of severe  hepatic steatosis. No definite suspicious cystic or solid hepatic lesions are confidently identified on today's noncontrast CT examination. No overt signs of hepatic trauma on today's noncontrast CT examination. Multiple tiny calcified gallstones lying dependently in the gallbladder, without surrounding  inflammatory changes to suggest an acute cholecystitis at this time. Pancreas: In the tail of the pancreas there is a 4.8 x 3.3 cm lesion of heterogeneous internal attenuation ranging from low to intermediate-high attenuation, incompletely characterized on today's noncontrast CT examination. This extends toward the splenic hilum. Head and body of the pancreas are otherwise unremarkable in appearance on today's noncontrast CT examination. Spleen: Spleen appears intact, and otherwise unremarkable in appearance. Adrenals/Urinary Tract: No definite signs of significant acute traumatic injury to either kidney or adrenal gland on today's noncontrast CT examination. Unenhanced appearance of the kidneys and bilateral adrenal glands is unremarkable. No hydroureteronephrosis. Urinary bladder appears intact, and unremarkable. Stomach/Bowel: No definite signs of acute traumatic injury to the hollow viscera. Unenhanced appearance of the stomach is normal. No pathologic dilatation of small bowel or colon. Normal appendix. Vascular/Lymphatic: Aortic atherosclerosis. Multiple prominent borderline enlarged and mildly enlarged retroperitoneal lymph nodes are noted in the abdomen, largest of which is in the aortocaval nodal station (axial image 70 of series 2) measuring 1.3 cm in short axis. Reproductive: Prostate gland and seminal vesicles are unremarkable in appearance. Other: Soft tissue stranding and trace amount of fluid in the retroperitoneum adjacent to the tail of the pancreas tracking slightly caudally. No significant volume of ascites. No pneumoperitoneum. Musculoskeletal: No acute displaced fractures or aggressive appearing lytic or blastic lesions are noted in the visualized portions of the skeleton. IMPRESSION: 1. Unusual appearance of the tail of the pancreas, poorly characterized on today's noncontrast CT examination. This could represent a complex pancreatic pseudocyst related to pancreatitis, however, the possibility  of underlying mass is not entirely excluded. The appearance is less likely related to trauma. Further characterization with pancreatic protocol CT scan or abdominal MRI with and without IV gadolinium with MRCP is recommended to exclude underlying malignancy. 2. No definite signs of significant acute traumatic injury in the chest, abdomen or pelvis. 3. Multiple prominent borderline enlarged and mildly enlarged retroperitoneal lymph nodes and mildly enlarged subcarinal lymph node. This is nonspecific and should be correlated with results of forthcoming abdominal scan to evaluate the pancreas, as metastatic disease is not excluded. 4. Probable post infectious or inflammatory scarring or atelectasis in the right lower lobe. 5. Cholelithiasis without evidence of acute cholecystitis at this time. 6. Hepatomegaly with severe hepatic steatosis. 7. Aortic atherosclerosis, in addition to left main and 3 vessel coronary artery disease. Please note that although the presence of coronary artery calcium documents the presence of coronary artery disease, the severity of this disease and any potential stenosis cannot be assessed on this non-gated CT examination. Assessment for potential risk factor modification, dietary therapy or pharmacologic therapy may be warranted, if clinically indicated. 8. Additional incidental findings, as above. Electronically Signed   By: Vinnie Langton M.D.   On: 03/30/2019 08:19   Ct Chest Wo Contrast  Result Date: 03/30/2019 CLINICAL DATA:  58 year old male with history of blunt abdominal trauma from a fall. Left upper flank and rib pain for the past 2 days. Nausea and vomiting. EXAM: CT CHEST, ABDOMEN AND PELVIS WITHOUT CONTRAST TECHNIQUE: Multidetector CT imaging of the chest, abdomen and pelvis was performed following the standard protocol without IV contrast. COMPARISON:  No priors. FINDINGS: CT CHEST FINDINGS Cardiovascular: Heart size is normal. There  is no significant pericardial fluid,  thickening or pericardial calcification. There is aortic atherosclerosis, as well as atherosclerosis of the great vessels of the mediastinum and the coronary arteries, including calcified atherosclerotic plaque in the left main, left anterior descending, left circumflex and right coronary arteries. Mediastinum/Nodes: Mildly enlarged subcarinal lymph node measuring 1.5 cm in short axis. No other pathologically enlarged mediastinal or hilar lymph nodes are noted. Please note that accurate exclusion of hilar adenopathy is limited on noncontrast CT scans. Esophagus is unremarkable in appearance. Lungs/Pleura: Area of probable scarring or atelectasis in the right lower lobe. No confluent consolidative airspace disease. No pleural effusions. No definite suspicious appearing pulmonary nodules or masses are noted. Musculoskeletal: No acute displaced fractures or aggressive appearing lytic or blastic lesions are noted in the visualized portions of the skeleton. CT ABDOMEN PELVIS FINDINGS Hepatobiliary: Liver is enlarged measuring 25.1 cm in craniocaudal span. Severe diffuse low attenuation throughout the hepatic parenchyma, indicative of severe hepatic steatosis. No definite suspicious cystic or solid hepatic lesions are confidently identified on today's noncontrast CT examination. No overt signs of hepatic trauma on today's noncontrast CT examination. Multiple tiny calcified gallstones lying dependently in the gallbladder, without surrounding inflammatory changes to suggest an acute cholecystitis at this time. Pancreas: In the tail of the pancreas there is a 4.8 x 3.3 cm lesion of heterogeneous internal attenuation ranging from low to intermediate-high attenuation, incompletely characterized on today's noncontrast CT examination. This extends toward the splenic hilum. Head and body of the pancreas are otherwise unremarkable in appearance on today's noncontrast CT examination. Spleen: Spleen appears intact, and otherwise  unremarkable in appearance. Adrenals/Urinary Tract: No definite signs of significant acute traumatic injury to either kidney or adrenal gland on today's noncontrast CT examination. Unenhanced appearance of the kidneys and bilateral adrenal glands is unremarkable. No hydroureteronephrosis. Urinary bladder appears intact, and unremarkable. Stomach/Bowel: No definite signs of acute traumatic injury to the hollow viscera. Unenhanced appearance of the stomach is normal. No pathologic dilatation of small bowel or colon. Normal appendix. Vascular/Lymphatic: Aortic atherosclerosis. Multiple prominent borderline enlarged and mildly enlarged retroperitoneal lymph nodes are noted in the abdomen, largest of which is in the aortocaval nodal station (axial image 70 of series 2) measuring 1.3 cm in short axis. Reproductive: Prostate gland and seminal vesicles are unremarkable in appearance. Other: Soft tissue stranding and trace amount of fluid in the retroperitoneum adjacent to the tail of the pancreas tracking slightly caudally. No significant volume of ascites. No pneumoperitoneum. Musculoskeletal: No acute displaced fractures or aggressive appearing lytic or blastic lesions are noted in the visualized portions of the skeleton. IMPRESSION: 1. Unusual appearance of the tail of the pancreas, poorly characterized on today's noncontrast CT examination. This could represent a complex pancreatic pseudocyst related to pancreatitis, however, the possibility of underlying mass is not entirely excluded. The appearance is less likely related to trauma. Further characterization with pancreatic protocol CT scan or abdominal MRI with and without IV gadolinium with MRCP is recommended to exclude underlying malignancy. 2. No definite signs of significant acute traumatic injury in the chest, abdomen or pelvis. 3. Multiple prominent borderline enlarged and mildly enlarged retroperitoneal lymph nodes and mildly enlarged subcarinal lymph node.  This is nonspecific and should be correlated with results of forthcoming abdominal scan to evaluate the pancreas, as metastatic disease is not excluded. 4. Probable post infectious or inflammatory scarring or atelectasis in the right lower lobe. 5. Cholelithiasis without evidence of acute cholecystitis at this time. 6. Hepatomegaly with severe hepatic steatosis.  7. Aortic atherosclerosis, in addition to left main and 3 vessel coronary artery disease. Please note that although the presence of coronary artery calcium documents the presence of coronary artery disease, the severity of this disease and any potential stenosis cannot be assessed on this non-gated CT examination. Assessment for potential risk factor modification, dietary therapy or pharmacologic therapy may be warranted, if clinically indicated. 8. Additional incidental findings, as above. Electronically Signed   By: Vinnie Langton M.D.   On: 03/30/2019 08:19       Assessment / Plan:   58 y/o male with significant alcohol abuse, blunt trauma to the abdomen in recent days, admitted with hematemesis. Incidentally noted to have severe thrombocytopenia, severe AKI, pancreatic tail lesion on CT scan.  Suspect he may have alcoholic hepatitis with possible underlying cirrhosis. His DF is 15, does not warrant steroids right now if he has alcoholic hepatitis. He has been given empiric octreotide and protonix, he has not had any bleeding symptoms since admission and is stable in this regard. Hgb has not changed much since admission despite fluids being given. He certainly warrants an EGD at some point however he has severe thrombocytopenia and is confused on questioning, with tachycardia, I'm concerned about alcohol withdrawal. In this situation, would hold on EGD today but I am available this weekend to do it should he have any active bleeding that needs to be addressed. Should he have any active bleeding he will need a platelet transfusion. AKI slightly  better, but more time needed for renal function to improve prior to imaging his pancreas with an MRI. Overall he appears stable right now from a bleeding perspective  Recommend: - clear liquid diet okay today - continue IV protonix and empiric octreotide, trend Hgb, trend INR daily, give dose of vitamin K - will need EGD, question is timing. No active bleeding today and with some concern for withdrawal symptoms and severe thrombocytopenia will hold off today and reassess him in the AM - should the patient have any overt bleeding, he will need a platelet transfusion and please contact me, am available to do endoscopy urgently if needed in this situation - will need MRI of the pancreas once renal function improves - will need alcohol abstinence as outpatient - will be difficult has failed rehab, has severe depression, would benefit from psych consult  Will follow closely, please call with questions or changes in his status.   [place], MD [facility]

## 2019-04-01 ENCOUNTER — Inpatient Hospital Stay (HOSPITAL_COMMUNITY): Payer: Self-pay

## 2019-04-01 DIAGNOSIS — Z789 Other specified health status: Secondary | ICD-10-CM | POA: Insufficient documentation

## 2019-04-01 DIAGNOSIS — R0682 Tachypnea, not elsewhere classified: Secondary | ICD-10-CM | POA: Insufficient documentation

## 2019-04-01 DIAGNOSIS — F1020 Alcohol dependence, uncomplicated: Secondary | ICD-10-CM

## 2019-04-01 DIAGNOSIS — T6591XA Toxic effect of unspecified substance, accidental (unintentional), initial encounter: Secondary | ICD-10-CM

## 2019-04-01 DIAGNOSIS — K7290 Hepatic failure, unspecified without coma: Secondary | ICD-10-CM

## 2019-04-01 DIAGNOSIS — R109 Unspecified abdominal pain: Secondary | ICD-10-CM

## 2019-04-01 DIAGNOSIS — K7682 Hepatic encephalopathy: Secondary | ICD-10-CM | POA: Diagnosis present

## 2019-04-01 DIAGNOSIS — J9600 Acute respiratory failure, unspecified whether with hypoxia or hypercapnia: Secondary | ICD-10-CM

## 2019-04-01 DIAGNOSIS — K7200 Acute and subacute hepatic failure without coma: Secondary | ICD-10-CM | POA: Diagnosis present

## 2019-04-01 LAB — BASIC METABOLIC PANEL
Anion gap: 28 — ABNORMAL HIGH (ref 5–15)
Anion gap: 19 — ABNORMAL HIGH (ref 5–15)
BUN: 54 mg/dL — ABNORMAL HIGH (ref 6–20)
BUN: 57 mg/dL — ABNORMAL HIGH (ref 6–20)
CO2: 9 mmol/L — ABNORMAL LOW (ref 22–32)
CO2: 19 mmol/L — ABNORMAL LOW (ref 22–32)
Calcium: 6.9 mg/dL — ABNORMAL LOW (ref 8.9–10.3)
Calcium: 6.4 mg/dL — CL (ref 8.9–10.3)
Chloride: 115 mmol/L — ABNORMAL HIGH (ref 98–111)
Chloride: 117 mmol/L — ABNORMAL HIGH (ref 98–111)
Creatinine, Ser: 1.78 mg/dL — ABNORMAL HIGH (ref 0.61–1.24)
Creatinine, Ser: 2.05 mg/dL — ABNORMAL HIGH (ref 0.61–1.24)
GFR calc Af Amer: 47 mL/min — ABNORMAL LOW (ref >60)
GFR calc Af Amer: 40 mL/min — ABNORMAL LOW (ref >60)
GFR calc non Af Amer: 41 mL/min — ABNORMAL LOW (ref >60)
GFR calc non Af Amer: 34 mL/min — ABNORMAL LOW (ref >60)
Glucose, Bld: 190 mg/dL — ABNORMAL HIGH (ref 70–99)
Glucose, Bld: 214 mg/dL — ABNORMAL HIGH (ref 70–99)
Potassium: 4.5 mmol/L (ref 3.5–5.1)
Potassium: 3.1 mmol/L — ABNORMAL LOW (ref 3.5–5.1)
Sodium: 152 mmol/L — ABNORMAL HIGH (ref 135–145)
Sodium: 155 mmol/L — ABNORMAL HIGH (ref 135–145)

## 2019-04-01 LAB — RAPID URINE DRUG SCREEN, HOSP PERFORMED
Amphetamines: NOT DETECTED
Barbiturates: NOT DETECTED
Benzodiazepines: POSITIVE — AB
Cocaine: NOT DETECTED
Opiates: NOT DETECTED
Tetrahydrocannabinol: NOT DETECTED

## 2019-04-01 LAB — BLOOD GAS, ARTERIAL
Acid-base deficit: 16.4 mmol/L — ABNORMAL HIGH (ref 0.0–2.0)
Bicarbonate: 9.3 mmol/L — ABNORMAL LOW (ref 20.0–28.0)
Delivery systems: POSITIVE
Drawn by: 11249
Drawn by: 560031
Expiratory PAP: 5
FIO2: 40
FIO2: 100
Inspiratory PAP: 10
MECHVT: 430 mL
Mode: POSITIVE
O2 Saturation: 98.7 %
O2 Saturation: 99.6 %
PEEP: 5 cmH2O
Patient temperature: 98.4
Patient temperature: 98.6
RATE: 25 resp/min
pCO2 arterial: 22.1 mmHg — ABNORMAL LOW (ref 32.0–48.0)
pH, Arterial: 7.303 — ABNORMAL LOW (ref 7.350–7.450)
pH, Arterial: 7.249 — ABNORMAL LOW (ref 7.350–7.450)
pO2, Arterial: 167 mmHg — ABNORMAL HIGH (ref 83.0–108.0)
pO2, Arterial: 479 mmHg — ABNORMAL HIGH (ref 83.0–108.0)

## 2019-04-01 LAB — URINALYSIS, ROUTINE W REFLEX MICROSCOPIC
Bilirubin Urine: NEGATIVE
Glucose, UA: NEGATIVE mg/dL
Hgb urine dipstick: NEGATIVE
Ketones, ur: 20 mg/dL — AB
Leukocytes,Ua: NEGATIVE
Nitrite: NEGATIVE
Protein, ur: NEGATIVE mg/dL
Specific Gravity, Urine: 1.011 (ref 1.005–1.030)
pH: 6 (ref 5.0–8.0)

## 2019-04-01 LAB — COMPREHENSIVE METABOLIC PANEL
ALT: 97 U/L — ABNORMAL HIGH (ref 0–44)
AST: 200 U/L — ABNORMAL HIGH (ref 15–41)
Albumin: 2.8 g/dL — ABNORMAL LOW (ref 3.5–5.0)
Alkaline Phosphatase: 319 U/L — ABNORMAL HIGH (ref 38–126)
Anion gap: 29 — ABNORMAL HIGH (ref 5–15)
BUN: 51 mg/dL — ABNORMAL HIGH (ref 6–20)
CO2: 10 mmol/L — ABNORMAL LOW (ref 22–32)
Calcium: 6.8 mg/dL — ABNORMAL LOW (ref 8.9–10.3)
Chloride: 114 mmol/L — ABNORMAL HIGH (ref 98–111)
Creatinine, Ser: 1.72 mg/dL — ABNORMAL HIGH (ref 0.61–1.24)
GFR calc Af Amer: 49 mL/min — ABNORMAL LOW (ref >60)
GFR calc non Af Amer: 42 mL/min — ABNORMAL LOW (ref >60)
Glucose, Bld: 202 mg/dL — ABNORMAL HIGH (ref 70–99)
Potassium: 3.6 mmol/L (ref 3.5–5.1)
Sodium: 153 mmol/L — ABNORMAL HIGH (ref 135–145)
Total Bilirubin: 5.4 mg/dL — ABNORMAL HIGH (ref 0.3–1.2)
Total Protein: 7.5 g/dL (ref 6.5–8.1)

## 2019-04-01 LAB — PROTIME-INR
INR: 1.5 — ABNORMAL HIGH (ref 0.8–1.2)
Prothrombin Time: 17.4 seconds — ABNORMAL HIGH (ref 11.4–15.2)

## 2019-04-01 LAB — CBC
HCT: 28.1 % — ABNORMAL LOW (ref 39.0–52.0)
Hemoglobin: 8.8 g/dL — ABNORMAL LOW (ref 13.0–17.0)
MCH: 37.1 pg — ABNORMAL HIGH (ref 26.0–34.0)
MCHC: 31.3 g/dL (ref 30.0–36.0)
MCV: 118.6 fL — ABNORMAL HIGH (ref 80.0–100.0)
Platelets: 24 10*3/uL — CL (ref 150–400)
RBC: 2.37 MIL/uL — ABNORMAL LOW (ref 4.22–5.81)
RDW: 15.7 % — ABNORMAL HIGH (ref 11.5–15.5)
WBC: 9.4 10*3/uL (ref 4.0–10.5)
nRBC: 0 % (ref 0.0–0.2)

## 2019-04-01 LAB — AMMONIA: Ammonia: 98 umol/L — ABNORMAL HIGH (ref 9–35)

## 2019-04-01 LAB — GLUCOSE, CAPILLARY
Glucose-Capillary: 166 mg/dL — ABNORMAL HIGH (ref 70–99)
Glucose-Capillary: 179 mg/dL — ABNORMAL HIGH (ref 70–99)
Glucose-Capillary: 200 mg/dL — ABNORMAL HIGH (ref 70–99)

## 2019-04-01 LAB — VOLATILES,BLD-ACETONE,ETHANOL,ISOPROP,METHANOL
Acetone, blood: 0.046 % — ABNORMAL HIGH (ref 0.000–0.010)
Acetone, blood: 0.047 % — ABNORMAL HIGH (ref 0.000–0.010)
Ethanol, blood: NEGATIVE % (ref 0.000–0.010)
Ethanol, blood: 0 % (ref 0.000–0.010)
Isopropanol, blood: NEGATIVE % (ref 0.000–0.010)
Isopropanol, blood: 0.003 % (ref 0.000–0.010)
Methanol, blood: NEGATIVE % (ref 0.000–0.010)
Methanol, blood: 0 % (ref 0.000–0.010)

## 2019-04-01 LAB — SALICYLATE LEVEL: Salicylate Lvl: <7 mg/dL (ref 2.8–30.0)

## 2019-04-01 LAB — OSMOLALITY: Osmolality: 356 mOsm/kg (ref 275–295)

## 2019-04-01 LAB — NA AND K (SODIUM & POTASSIUM), RAND UR
Potassium Urine: 41 mmol/L
Sodium, Ur: 37 mmol/L

## 2019-04-01 LAB — ETHANOL: Alcohol, Ethyl (B): <10 mg/dL (ref <10)

## 2019-04-01 LAB — ETHYLENE GLYCOL: Ethylene Glycol Lvl: <5 mg/dL

## 2019-04-01 LAB — CHLORIDE, URINE, RANDOM: Chloride Urine: 68 mmol/L

## 2019-04-01 LAB — LACTIC ACID, PLASMA
Lactic Acid, Venous: 1.3 mmol/L (ref 0.5–1.9)
Lactic Acid, Venous: 0.7 mmol/L (ref 0.5–1.9)

## 2019-04-01 LAB — ACETAMINOPHEN LEVEL: Acetaminophen (Tylenol), Serum: <10 ug/mL — ABNORMAL LOW (ref 10–30)

## 2019-04-01 MED ORDER — SODIUM BICARBONATE 8.4 % IV SOLN
50.0000 meq | Freq: Once | INTRAVENOUS | Status: AC
  Administered 2019-04-01: 50 meq via INTRAVENOUS

## 2019-04-01 MED ORDER — FENTANYL BOLUS VIA INFUSION
50.0000 ug | INTRAVENOUS | Status: DC | PRN
  Administered 2019-04-01 – 2019-04-05 (×32): 50 ug via INTRAVENOUS
  Filled 2019-04-01: qty 50

## 2019-04-01 MED ORDER — SODIUM CHLORIDE 0.9 % IV SOLN
250.0000 mL | INTRAVENOUS | Status: DC
  Administered 2019-04-01: 13:00:00 20 mL via INTRAVENOUS
  Administered 2019-04-02 – 2019-04-19 (×5): 250 mL via INTRAVENOUS

## 2019-04-01 MED ORDER — PHENYLEPHRINE HCL-NACL 10-0.9 MG/250ML-% IV SOLN: 0.0000 ug/min | INTRAVENOUS | Status: DC

## 2019-04-01 MED ORDER — THIAMINE HCL 100 MG/ML IJ SOLN
50.0000 mg | Freq: Once | INTRAMUSCULAR | Status: AC
  Administered 2019-04-01: 50 mg via INTRAVENOUS
  Filled 2019-04-01: qty 2

## 2019-04-01 MED ORDER — SODIUM CHLORIDE 0.45 % IV SOLN: INTRAVENOUS | Status: DC

## 2019-04-01 MED ORDER — FOMEPIZOLE 1 GM/ML IV SOLN: 10.0000 mg/kg | Freq: Two times a day (BID) | INTRAVENOUS | Status: DC

## 2019-04-01 MED ORDER — PHENYLEPHRINE HCL-NACL 10-0.9 MG/250ML-% IV SOLN
25.0000 ug/min | INTRAVENOUS | Status: DC
  Administered 2019-04-01: 23:00:00 95 ug/min via INTRAVENOUS
  Administered 2019-04-01 (×3): 85 ug/min via INTRAVENOUS
  Administered 2019-04-01: 13:00:00 25 ug/min via INTRAVENOUS
  Administered 2019-04-02 (×3): 95 ug/min via INTRAVENOUS
  Administered 2019-04-02: 75 ug/min via INTRAVENOUS
  Administered 2019-04-02 (×3): 85 ug/min via INTRAVENOUS
  Administered 2019-04-02: 55 ug/min via INTRAVENOUS
  Administered 2019-04-02: 15:00:00 65 ug/min via INTRAVENOUS
  Administered 2019-04-03: 14:00:00 30 ug/min via INTRAVENOUS
  Administered 2019-04-04: 20 ug/min via INTRAVENOUS
  Administered 2019-04-05: 09:00:00 10 ug/min via INTRAVENOUS
  Administered 2019-04-06: 01:00:00 15 ug/min via INTRAVENOUS
  Administered 2019-04-06: 08:00:00 25 ug/min via INTRAVENOUS
  Filled 2019-04-01 (×24): qty 250

## 2019-04-01 MED ORDER — SODIUM BICARBONATE 8.4 % IV SOLN
INTRAVENOUS | Status: AC
  Filled 2019-04-01: qty 50

## 2019-04-01 MED ORDER — SODIUM BICARBONATE-DEXTROSE 150-5 MEQ/L-% IV SOLN
150.0000 meq | INTRAVENOUS | Status: DC
  Administered 2019-04-01: 18:00:00 150 meq via INTRAVENOUS
  Filled 2019-04-01 (×2): qty 1000

## 2019-04-01 MED ORDER — VITAMIN B-1 100 MG PO TABS: 500.0000 mg | ORAL_TABLET | Freq: Once | ORAL | Status: AC

## 2019-04-01 MED ORDER — FENTANYL CITRATE (PF) 100 MCG/2ML IJ SOLN
INTRAMUSCULAR | Status: AC
  Filled 2019-04-01: qty 2

## 2019-04-01 MED ORDER — FENTANYL CITRATE (PF) 100 MCG/2ML IJ SOLN
INTRAMUSCULAR | Status: AC
  Administered 2019-04-01: 100 ug
  Filled 2019-04-01: qty 2

## 2019-04-01 MED ORDER — SODIUM BICARBONATE-DEXTROSE 150-5 MEQ/L-% IV SOLN
150.0000 meq | INTRAVENOUS | Status: DC
  Filled 2019-04-01: qty 1000

## 2019-04-01 MED ORDER — SODIUM CHLORIDE 0.45 % IV SOLN
INTRAVENOUS | Status: DC
  Administered 2019-04-01: 14:00:00 via INTRAVENOUS

## 2019-04-01 MED ORDER — FREE WATER
200.0000 mL | Freq: Three times a day (TID) | Status: DC
  Administered 2019-04-01 – 2019-04-03 (×4): 200 mL

## 2019-04-01 MED ORDER — SODIUM CHLORIDE 0.9% FLUSH
10.0000 mL | INTRAVENOUS | Status: DC | PRN
  Administered 2019-04-02: 10 mL
  Filled 2019-04-01: qty 40

## 2019-04-01 MED ORDER — MIDAZOLAM HCL 2 MG/2ML IJ SOLN
INTRAMUSCULAR | Status: AC
  Administered 2019-04-01: 2 mg
  Filled 2019-04-01: qty 2

## 2019-04-01 MED ORDER — FENTANYL 2500MCG IN NS 250ML (10MCG/ML) PREMIX INFUSION
50.0000 ug/h | INTRAVENOUS | Status: DC
  Administered 2019-04-01: 50 ug/h via INTRAVENOUS
  Administered 2019-04-02: 05:00:00 150 ug/h via INTRAVENOUS
  Administered 2019-04-02: 16:00:00 200 ug/h via INTRAVENOUS
  Administered 2019-04-03: 175 ug/h via INTRAVENOUS
  Administered 2019-04-03: 200 ug/h via INTRAVENOUS
  Administered 2019-04-04: 250 ug/h via INTRAVENOUS
  Administered 2019-04-04: 01:00:00 325 ug/h via INTRAVENOUS
  Administered 2019-04-04: 16:00:00 400 ug/h via INTRAVENOUS
  Administered 2019-04-05 – 2019-04-06 (×2): 200 ug/h via INTRAVENOUS
  Filled 2019-04-01 (×13): qty 250

## 2019-04-01 MED ORDER — ORAL CARE MOUTH RINSE
15.0000 mL | OROMUCOSAL | Status: DC
  Administered 2019-04-01 – 2019-04-06 (×40): 15 mL via OROMUCOSAL

## 2019-04-01 MED ORDER — CALCIUM GLUCONATE-NACL 1-0.675 GM/50ML-% IV SOLN
1.0000 g | Freq: Once | INTRAVENOUS | Status: AC
  Administered 2019-04-01: 1000 mg via INTRAVENOUS
  Filled 2019-04-01: qty 50

## 2019-04-01 MED ORDER — POTASSIUM CHLORIDE 20 MEQ/15ML (10%) PO SOLN
10.0000 meq | Freq: Once | ORAL | Status: AC
  Administered 2019-04-01: 10 meq via ORAL
  Filled 2019-04-01: qty 15

## 2019-04-01 MED ORDER — POTASSIUM CHLORIDE 10 MEQ/100ML IV SOLN
10.0000 meq | INTRAVENOUS | Status: AC
  Administered 2019-04-01 – 2019-04-02 (×4): 10 meq via INTRAVENOUS
  Filled 2019-04-01 (×4): qty 100

## 2019-04-01 MED ORDER — STERILE WATER FOR INJECTION IV SOLN
INTRAVENOUS | Status: DC
  Administered 2019-04-01: 16:00:00 via INTRAVENOUS
  Filled 2019-04-01: qty 850

## 2019-04-01 MED ORDER — LACTULOSE 10 GM/15ML PO SOLN
20.0000 g | Freq: Two times a day (BID) | ORAL | Status: DC
  Administered 2019-04-01 (×2): 20 g via NASOGASTRIC
  Filled 2019-04-01 (×2): qty 30

## 2019-04-01 MED ORDER — PHENYLEPHRINE HCL-NACL 10-0.9 MG/250ML-% IV SOLN
INTRAVENOUS | Status: AC
  Filled 2019-04-01: qty 250

## 2019-04-01 MED ORDER — THIAMINE HCL 100 MG/ML IJ SOLN
500.0000 mg | Freq: Once | INTRAVENOUS | Status: AC
  Administered 2019-04-01: 21:00:00 500 mg via INTRAVENOUS
  Filled 2019-04-01: qty 5

## 2019-04-01 MED ORDER — INSULIN ASPART 100 UNIT/ML ~~LOC~~ SOLN
0.0000 [IU] | SUBCUTANEOUS | Status: DC
  Administered 2019-04-01 (×3): 3 [IU] via SUBCUTANEOUS
  Administered 2019-04-02: 2 [IU] via SUBCUTANEOUS
  Administered 2019-04-02: 12:00:00 3 [IU] via SUBCUTANEOUS
  Administered 2019-04-02 (×3): 2 [IU] via SUBCUTANEOUS
  Administered 2019-04-03: 17:00:00 3 [IU] via SUBCUTANEOUS
  Administered 2019-04-03 (×2): 2 [IU] via SUBCUTANEOUS
  Administered 2019-04-03: 3 [IU] via SUBCUTANEOUS
  Administered 2019-04-03: 04:00:00 2 [IU] via SUBCUTANEOUS
  Administered 2019-04-03: 3 [IU] via SUBCUTANEOUS
  Administered 2019-04-03 – 2019-04-04 (×2): 2 [IU] via SUBCUTANEOUS
  Administered 2019-04-04: 3 [IU] via SUBCUTANEOUS
  Administered 2019-04-04 – 2019-04-07 (×7): 2 [IU] via SUBCUTANEOUS
  Administered 2019-04-07 (×3): 3 [IU] via SUBCUTANEOUS
  Administered 2019-04-08 (×2): 2 [IU] via SUBCUTANEOUS
  Administered 2019-04-08: 3 [IU] via SUBCUTANEOUS
  Administered 2019-04-08 (×2): 2 [IU] via SUBCUTANEOUS
  Administered 2019-04-08 – 2019-04-09 (×2): 3 [IU] via SUBCUTANEOUS
  Administered 2019-04-09 (×2): 2 [IU] via SUBCUTANEOUS
  Administered 2019-04-09: 3 [IU] via SUBCUTANEOUS
  Administered 2019-04-09 (×2): 2 [IU] via SUBCUTANEOUS
  Administered 2019-04-10: 20:00:00 3 [IU] via SUBCUTANEOUS
  Administered 2019-04-10 – 2019-04-11 (×5): 2 [IU] via SUBCUTANEOUS
  Administered 2019-04-12: 3 [IU] via SUBCUTANEOUS
  Administered 2019-04-12 – 2019-04-13 (×7): 2 [IU] via SUBCUTANEOUS
  Administered 2019-04-13: 1 [IU] via SUBCUTANEOUS
  Administered 2019-04-14 (×3): 2 [IU] via SUBCUTANEOUS

## 2019-04-01 MED ORDER — DEXTROSE 5 % IV SOLN
INTRAVENOUS | Status: DC
  Administered 2019-04-01 – 2019-04-03 (×4): via INTRAVENOUS

## 2019-04-01 MED ORDER — FENTANYL CITRATE (PF) 100 MCG/2ML IJ SOLN
50.0000 ug | Freq: Once | INTRAMUSCULAR | Status: AC
  Administered 2019-04-01: 13:00:00 50 ug via INTRAVENOUS

## 2019-04-01 MED ORDER — VITAMIN K1 10 MG/ML IJ SOLN
5.0000 mg | Freq: Once | INTRAVENOUS | Status: AC
  Administered 2019-04-01: 5 mg via INTRAVENOUS
  Filled 2019-04-01: qty 0.5

## 2019-04-01 MED ORDER — SODIUM CHLORIDE 0.9 % IV SOLN
15.0000 mg/kg | Freq: Once | INTRAVENOUS | Status: AC
  Administered 2019-04-01: 1330 mg via INTRAVENOUS
  Filled 2019-04-01: qty 1.33

## 2019-04-01 MED ORDER — FENTANYL CITRATE (PF) 100 MCG/2ML IJ SOLN: 100.0000 ug | Freq: Once | INTRAMUSCULAR | Status: DC

## 2019-04-01 MED ORDER — ETOMIDATE 2 MG/ML IV SOLN
20.0000 mg | Freq: Once | INTRAVENOUS | Status: AC
  Administered 2019-04-01: 13:00:00 20 mg via INTRAVENOUS

## 2019-04-01 MED ORDER — THIAMINE HCL 100 MG/ML IJ SOLN
50.0000 mg | Freq: Four times a day (QID) | INTRAMUSCULAR | Status: DC
  Administered 2019-04-02 (×2): 50 mg via INTRAVENOUS
  Filled 2019-04-01 (×2): qty 2

## 2019-04-01 MED ORDER — CHLORHEXIDINE GLUCONATE 0.12% ORAL RINSE (MEDLINE KIT)
15.0000 mL | Freq: Two times a day (BID) | OROMUCOSAL | Status: DC
  Administered 2019-04-01 – 2019-04-06 (×9): 15 mL via OROMUCOSAL

## 2019-04-01 NOTE — Consult Note (Signed)
[facility]    NEPHROLOGY CONSULTATION NOTE  PATIENT ID:  Richard Calhoun, DOB:  02/06/1961  HPI: The patient is a 58 y.o. year old male past medical history significant for chronic ethanol consumption, chronic tobacco use, CAD, PCI of LAD with staged arthrectomy with drug-eluting stent placement, chronic congestive heart failure, hyperlipidemia, mild peripheral vascular disease, and paroxysmal tachycardia who presented to the hospital for increasing malaise for the past several days.  He apparently vomited a lot of bright red blood and had not been eating.  He reported continuing to drink alcohol.  He was treated with IV fluids.  Of note, his creatinine was 2.8 on admission which is steadily improving.  Early this morning, he was noted to have increasing O2 requirements, tachypnea, and decreased LOC.  Evaluation revealed a pH of 7.25, and very low CO2, and he was placed on BiPAP.  He was ultimately intubated for worsening respiratory distress.  Evaluation revealed a anion gap metabolic acidosis, prompting further renal investigation.   Past Medical History:  Diagnosis Date  . Acute renal failure ([place])   . Alcoholic hepatitis without ascites   . ALLERGIC RHINITIS   . Anemia   . Anginal pain ([place])   . ANXIETY   . BICEPS TENDON RUPTURE, RIGHT   . CAD S/P percutaneous coronary angioplasty 03/2017   Cath: mRCA 99-100% CTO (calcific w/ L-R collaterals), p-mLAD calcified 60-80% (FFR 0.74 after D2), 50% mCx. --> Staged Atherectomy-DES PCI of p-mLAD (Synergy DES 3.0 x 24 -- 3.3 mm)  . DEPENDENCE, ALCOHOL NEC/NOS, UNSPECIFIED   . DEPRESSION   . GERD   . Headache   . HYPERLIPIDEMIA   . HYPERTENSION   . Impaired glucose tolerance   . OSTEOARTHRITIS   . Tubular adenoma of colon 06/2006    Past Surgical History:  Procedure Laterality Date  . COLONOSCOPY W/ POLYPECTOMY  06/2006   Dr Fuller Plan.  For hematochezia. 5 mm, sessile polyp (tubular adenoma, no HGD).  injected saline to internal  hemorrhoids.  Did not respond to fup 2012, 2014 colonoscopy.    . CORONARY ANGIOPLASTY    . CORONARY ATHERECTOMY N/A 04/20/2017   Wellington Hampshire, MD;  p-mLAD (Orbital)  . CORONARY STENT INTERVENTION N/A 04/20/2017   Wellington Hampshire, MD;  Location: [facility]  Staged Orbital Atherectomy-DES PCI of p-mLAD (Synergy DES 3.0 x 24 -- 3.3 mm)  . ESOPHAGOGASTRODUODENOSCOPY  2000   Dr Laurence Spates.  Hiatal hernia.  [facility] Kitchen FOOT SURGERY    . INTRAVASCULAR PRESSURE WIRE/FFR STUDY N/A 04/15/2017   Procedure: INTRAVASCULAR PRESSURE WIRE/FFR STUDY; Belva Crome, MD; LAD = 0.74 after D2  . LEFT HEART CATH AND CORONARY ANGIOGRAPHY N/A 04/15/2017  . MANDIBLE FRACTURE SURGERY    . TRANSTHORACIC ECHOCARDIOGRAM  01/2017   Moderate LVH.  EF 60 to 65%.  No R WMA.  GR 1 DD. Mild AI.     Family History  Problem Relation Age of Onset  . Kidney disease Sister   . Stroke Mother   . Stroke Father     Social History   Tobacco Use  . Smoking status: Light Tobacco Smoker    Types: E-cigarettes    Last attempt to quit: 2017    Years since quitting: 3.7  . Smokeless tobacco: Never Used  . Tobacco comment: states he is smoking 1 pack 1/2 now that he is unemployed   Substance Use Topics  . Alcohol use: Yes    Alcohol/week: 0.0 standard drinks  . Drug use:  No    REVIEW OF SYSTEMS: Unobtainable as he is intubated and sedated on the ventilator.   PHYSICAL EXAM:  Vitals:   04/01/19 1400 04/01/19 1543  BP: (!) 87/55 91/65  Pulse: (!) 105 98  Resp: 17 (!) 27  Temp:    SpO2: 96% 100%   I/O last 3 completed shifts: In: 4731.7 [I.V.:4731.7] Out: 6200 [Urine:6200]   General: Sedated NAD HEENT: MMM [place] AT anicteric sclera, orally intubated Neck:  No JVD, no adenopathy CV:  Heart RRR  Lungs:  L/S CTA bilaterally Abd:  abd SNT/ND with normal BS GU:  Bladder non-palpable, positive Foley Extremities: +1 bilateral lower extremity edema Skin:  No skin rash Psych: Sedated on the ventilator Neuro:  no focal  deficits, sedated   CURRENT MEDICATIONS:  . Chlorhexidine Gluconate Cloth  6 each Topical Daily  . fentaNYL (SUBLIMAZE) injection  100 mcg Intravenous Once  . lactulose  20 g Per NG tube BID  . LORazepam  0-4 mg Intravenous Q12H   Or  . LORazepam  0-4 mg Oral Q12H  . mouth rinse  15 mL Mouth Rinse BID  . metoprolol tartrate  25 mg Oral BID  . pantoprazole (PROTONIX) IV  40 mg Intravenous Q12H  . sodium chloride flush  3 mL Intravenous Q12H  . thiamine  100 mg Oral Daily   Or  . thiamine  100 mg Intravenous Daily     HOME MEDICATIONS:  Prior to Admission medications   Medication Sig Start Date End Date Taking? Authorizing Provider  atorvastatin (LIPITOR) 80 MG tablet Take 1 tablet (80 mg total) by mouth daily at 6 PM. 12/28/18  Yes Newlin, Enobong, MD  losartan (COZAAR) 100 MG tablet Take 1 tablet (100 mg total) by mouth daily. 12/28/18  Yes Charlott Rakes, MD  metoprolol tartrate (LOPRESSOR) 25 MG tablet Take 1 tablet (25 mg total) by mouth 2 (two) times daily. 12/28/18  Yes Charlott Rakes, MD  Multiple Vitamins-Minerals (CENTRUM) tablet Take 1 tablet by mouth daily.    Yes [provider]  nitroGLYCERIN (NITROSTAT) 0.4 MG SL tablet Place 1 tablet (0.4 mg total) under the tongue every 5 (five) minutes as needed for chest pain. 11/13/18  Yes Hilty, Nadean Corwin, MD  omeprazole (PRILOSEC OTC) 20 MG tablet Take 1 tablet (20 mg total) by mouth daily. 12/28/18  Yes Charlott Rakes, MD  polycarbophil (FIBERCON) 625 MG tablet Take 625 mg by mouth daily.   Yes [provider]  clotrimazole-betamethasone (LOTRISONE) cream Apply 1 application topically 2 (two) times daily. Patient not taking: Reported on 03/30/2019 11/24/18   Charlott Rakes, MD  doxycycline (VIBRA-TABS) 100 MG tablet Take 1 tablet (100 mg total) by mouth 2 (two) times daily. Patient not taking: Reported on 03/30/2019 11/17/18   Ladell Pier, MD  furosemide (LASIX) 40 MG tablet Take 1 tablet (40 mg total) by  mouth daily. 12/28/18 03/28/19  Charlott Rakes, MD       LABS:  CBC Latest Ref Rng & Units 04/01/2019 03/31/2019 03/30/2019  WBC 4.0 - 10.5 K/uL 9.4 6.9 8.5  Hemoglobin 13.0 - 17.0 g/dL 8.8(L) 8.0(L) 8.6(L)  Hematocrit 39.0 - 52.0 % 28.1(L) 26.3(L) 26.7(L)  Platelets 150 - 400 K/uL 24(LL) 27(LL) 34(L)    CMP Latest Ref Rng & Units 04/01/2019 04/01/2019 03/31/2019  Glucose 70 - 99 mg/dL 202(H) 190(H) 102(H)  BUN 6 - 20 mg/dL 51(H) 54(H) 64(H)  Creatinine 0.61 - 1.24 mg/dL 1.72(H) 1.78(H) 2.39(H)  Sodium 135 - 145 mmol/L 153(H) 152(H)  140  Potassium 3.5 - 5.1 mmol/L 3.6 4.5 4.9  Chloride 98 - 111 mmol/L 114(H) 115(H) 106  CO2 22 - 32 mmol/L 10(L) 9(L) 8(L)  Calcium 8.9 - 10.3 mg/dL 6.8(L) 6.9(L) 6.8(L)  Total Protein 6.5 - 8.1 g/dL 7.5 - 7.1  Total Bilirubin 0.3 - 1.2 mg/dL 5.4(H) - 4.4(H)  Alkaline Phos 38 - 126 U/L 319(H) - 311(H)  AST 15 - 41 U/L 200(H) - 187(H)  ALT 0 - 44 U/L 97(H) - 76(H)    Lab Results  Component Value Date   CALCIUM 6.8 (L) 04/01/2019   CAION 1.19 04/14/2017   PHOS 2.8 04/20/2017       Component Value Date/Time   COLORURINE AMBER (A) 04/01/2019 1336   APPEARANCEUR CLEAR 04/01/2019 1336   LABSPEC 1.011 04/01/2019 1336   PHURINE 6.0 04/01/2019 1336   GLUCOSEU NEGATIVE 04/01/2019 1336   GLUCOSEU NEGATIVE 08/29/2012 1520   HGBUR NEGATIVE 04/01/2019 1336   BILIRUBINUR NEGATIVE 04/01/2019 1336   KETONESUR 20 (A) 04/01/2019 1336   PROTEINUR NEGATIVE 04/01/2019 1336   UROBILINOGEN 0.2 08/29/2012 1520   NITRITE NEGATIVE 04/01/2019 1336   LEUKOCYTESUR NEGATIVE 04/01/2019 1336      Component Value Date/Time   PHART 7.249 (L) 04/01/2019 1338   PCO2ART 22.1 (L) 04/01/2019 1338   PO2ART 479 (H) 04/01/2019 1338   HCO3 9.3 (L) 04/01/2019 1338   TCO2 20 (L) 04/14/2017 1657   ACIDBASEDEF 16.4 (H) 04/01/2019 1338   O2SAT 99.6 04/01/2019 1338    No results found for: IRON, TIBC, FERRITIN, IRONPCTSAT     ASSESSMENT/PLAN:     1.  Baseline serum creatinine  1.03 on 08/26/2017.  2.  Acute kidney injury.  On admission, his serum creatinine was 3.67.  It has improved to 1.72.  He has had excellent urine output over the past 24 hours.  Continue supportive care.  3.  High anion gap metabolic acidosis.  His anion gap is actually increased during his hospitalization.  His renal function is improving.  Toxic alcohols have been ordered.  Osmolar gap is 21.  Urine positive for ketones from 03/29/2019 and 04/01/2019.  He has been n.p.o. and on clear liquids for a short time during this hospitalization.  Given his clinical deterioration, it certainly makes sense to treat him for a toxic ingestion.  Would give fomepizole x4 doses.  Hopefully we will see the results of a toxic alcohol panel soon.  Would continue D5 W with 150 mEq of bicarbonate at 125 mL/h.  I suspect this is related to alcoholic ketoacidosis and starvation ketosis, but given the clinical progression and osmolar gap, would continue with fomepizole as well.  4.  Acute respiratory failure.  This was likely related to his underlying metabolic acidosis.  Should improve with improvement in the acid-base issues.  5.  Alcohol abuse.  Agree with thiamine.  CIWA protocol.   [place], DO, [place]

## 2019-04-01 NOTE — Consult Note (Addendum)
NAME:  Richard Calhoun, MRN:  [id number], DOB:  1960-12-30, LOS: 2 ADMISSION DATE:  03/30/2019, CONSULTATION DATE:  04/01/19 REFERRING MD:  Landis Gandy, MD CHIEF COMPLAINT:  Acute respiratory failure  Brief History   58 year old male with alcohol abuse who presents hematemesis. Has had worsening mental status, tachypnea and oxygen requirement. Placed on BiPAP overnight. Transferred to PCCM for acute respiratory failure  History of present illness   Unable to obtain history due to altered mental status. Chart reviewed and staff spoke to wife for history.  [name] is a 58 year old male with hx of alcohol abuse who presented to ED with abdominal pain, nausea and vomiting, and episode of hematemesis. Per his wife, she noticed he had a poor appetite at least for the last two weeks. He drinks 2-3 drinks with her each night. He has been more noticeably drowsy and sleeping continuously for the last five days. He began complaining of left-sided abdominal pain associated with nausea and vomiting for three days that progressively worsened to the point that it was unbearable. The day before he presented he had an episode of hematemesis. Wife does note low level bottle of rubbing alcohol.   Past Medical History  CAD s/p DES, HTN, HLD  Significant Hospital Events   9/11 Admitted on BiPAP  Consults:  PCCM  Procedures:  ETT 9/13>  Significant Diagnostic Tests:  CT CAP 9/13 - Mildly enlarged subcarinal and retroperitoneal lymph nodes, RLL scarring, hepatomegaly, abnormal pancreas tail lesion  Micro Data:    Antimicrobials:    Interim history/subjective:    Objective   Blood pressure 126/90, pulse (!) 130, temperature 100.3 F (37.9 C), temperature source Axillary, resp. rate (!) 25, height 5\' 10"  (1.778 m), weight 88.9 kg, SpO2 100 %.    Vent Mode: BIPAP;PCV FiO2 (%):  [40 %] 40 % Set Rate:  [8 bmp-10 bmp] 8 bmp PEEP:  [5 cmH20] 5 cmH20   Intake/Output Summary (Last 24 hours)  at 04/01/2019 1152 Last data filed at 04/01/2019 [id number] Gross per 24 hour  Intake 2805.72 ml  Output 3200 ml  Net -394.28 ml   Filed Weights   03/30/19 1800  Weight: 88.9 kg   Physical Exam: General: Chronically ill-appearing, confused HENT: [place], AT, OP clear, MMM Eyes: EOMI, no scleral icterus Respiratory: Coarse breath sounds bilaterally Cardiovascular: RRR, -M/R/G, no JVD GI: BS+, soft, nontender Extremities:-Edema,-tenderness Neuro: Does not open eyes, incomprehensible speech, does not follow commands, moves extremities x 4 GU: Foley in place  Resolved Hospital Problem list     Assessment & Plan:   Acute respiratory failure: Exacerbated by metabolic acidosis Full vent support Wean FIO2/PEEP for goal SpO2 88-95%. No plan to wean or extubate due to mental status Reviewed ABG. Wean FIO2 otherwise no vent changes at this time  Metabolic acidosis 2/2 starvation ketoacidosis  Elevated osmolal gap: Measure (356) - calc (21 BMP @8 :00 PM and q4h Ethylene glycol and volatile panel sent (acetone,ethanol,isoprop,methanol) Consult Nephrology. Appreciate input. Will start fomepizole Continue D5 sodium bicarb gtt Continue thiamine  Hypernatremia Trend BMP FWF  Acute encephalopathy: acidosis, hyperammonemia, electrolyte abnormalities. Drug screen +benzo post-hospitalization, ethanol <10 Continue lactulose Electrolyte management as above  Hypotension secondary to sedation. Normotensive pre-procedure Wean neo for goal MAP >65 If persistent or requires increased pressor support, consider cultures and abx for developing sepsis  Anemia, ?acute vs chronic Hematemesis. None since admission Thrombocytopenia. Present on admission. Unclear if chronic. Last normal counts in 07/2016. Mildly elevated INR Trend  CBC Transfuse for goal Hg >7 and Plt 10 Octreotide, PPI and vitamin K per GI  Abnormal pancreas tail lesion on CT When stable, will need pancreatic CT or abdominal MRI  Best  practice:  Diet: NPO Pain/Anxiety/Delirium protocol (if indicated): Fentanyl gtt VAP protocol (if indicated): Yes DVT prophylaxis: Holding in setting of thrombocytopenia GI prophylaxis: PPI Glucose control: CBG q4h Mobility: BR Code Status: Full code Family Communication: Updated "wife" [name] on 9/13. Disposition: ICU  Labs   CBC: Recent Labs  Lab 03/30/19 0442 03/30/19 1600 03/31/19 0202 04/01/19 0755  WBC 8.9 8.5 6.9 9.4  HGB 8.5* 8.6* 8.0* 8.8*  HCT 26.0* 26.7* 26.3* 28.1*  MCV 116.6* 117.6* 122.9* 118.6*  PLT 39* 34* 27* 24*    Basic Metabolic Panel: Recent Labs  Lab 03/30/19 0442 03/30/19 1331 03/31/19 0202 04/01/19 0755  NA 139 141 140 152*  K 4.9 5.2* 4.9 4.5  CL 99 103 106 115*  CO2 13* 12* 8* 9*  GLUCOSE 86 78 102* 190*  BUN 72* 68* 64* 54*  CREATININE 3.67* 2.88* 2.39* 1.78*  CALCIUM 7.5* 7.0* 6.8* 6.9*   GFR: Estimated Creatinine Clearance: 49.6 mL/min (A) (by C-G formula based on SCr of 1.78 mg/dL (H)). Recent Labs  Lab 03/30/19 0442 03/30/19 1600 03/31/19 0202 04/01/19 0755  WBC 8.9 8.5 6.9 9.4    Liver Function Tests: Recent Labs  Lab 03/30/19 0442 03/30/19 1331 03/31/19 0202  AST 183* 190* 187*  ALT 73* 72* 76*  ALKPHOS 311* 315* 311*  BILITOT 2.6* 3.1* 4.4*  PROT 7.3 7.1 7.1  ALBUMIN 3.1* 2.9* 2.8*   Recent Labs  Lab 03/30/19 0442  LIPASE 64*   No results for input(s): AMMONIA in the last 168 hours.  ABG    Component Value Date/Time   PHART 7.351 04/01/2019 1040   PCO2ART PENDING 04/01/2019 1040   PO2ART 199 (H) 04/01/2019 1040   HCO3 PENDING 04/01/2019 1040   TCO2 20 (L) 04/14/2017 1657   ACIDBASEDEF PENDING 04/01/2019 1040   O2SAT 99.0 04/01/2019 1040     Coagulation Profile: Recent Labs  Lab 03/30/19 1331 03/31/19 0202 04/01/19 0257  INR 1.3* 1.5* 1.5*    Cardiac Enzymes: No results for input(s): CKTOTAL, CKMB, CKMBINDEX, TROPONINI in the last 168 hours.  HbA1C: Hemoglobin A1C  Date/Time Value Ref  Range Status  12/23/2016 12:01 PM 5.6  Final   Hgb A1c MFr Bld  Date/Time Value Ref Range Status  04/15/2017 02:34 AM 5.9 (H) 4.8 - 5.6 % Final    Comment:    (NOTE) Pre diabetes:          5.7%-6.4% Diabetes:              >6.4% Glycemic control for   <7.0% adults with diabetes   08/29/2012 03:20 PM 6.0 4.6 - 6.5 % Final    Comment:    Glycemic Control Guidelines for People with Diabetes:Non Diabetic:  <6%Goal of Therapy: <7%Additional Action Suggested:  >8%     CBG: Recent Labs  Lab 03/31/19 2330  [place] 181*    Review of Systems:   Unable to obtain due to mental status  Past Medical History  He,  has a past medical history of Acute renal failure ([place]), Alcoholic hepatitis without ascites, ALLERGIC RHINITIS, Anemia, Anginal pain ([place]), ANXIETY, BICEPS TENDON RUPTURE, RIGHT, CAD S/P percutaneous coronary angioplasty (03/2017), DEPENDENCE, ALCOHOL NEC/NOS, UNSPECIFIED, DEPRESSION, GERD, Headache, HYPERLIPIDEMIA, HYPERTENSION, Impaired glucose tolerance, OSTEOARTHRITIS, and Tubular adenoma of colon (06/2006).   Surgical History  Past Surgical History:  Procedure Laterality Date  . COLONOSCOPY W/ POLYPECTOMY  06/2006   Dr Fuller Plan.  For hematochezia. 5 mm, sessile polyp (tubular adenoma, no HGD).  injected saline to internal hemorrhoids.  Did not respond to fup 2012, 2014 colonoscopy.    . CORONARY ANGIOPLASTY    . CORONARY ATHERECTOMY N/A 04/20/2017   Wellington Hampshire, MD;  p-mLAD (Orbital)  . CORONARY STENT INTERVENTION N/A 04/20/2017   Wellington Hampshire, MD;  Location: [facility].  Staged Orbital Atherectomy-DES PCI of p-mLAD (Synergy DES 3.0 x 24 -- 3.3 mm)  . ESOPHAGOGASTRODUODENOSCOPY  2000   Dr Laurence Spates.  Hiatal hernia.  Marland Kitchen FOOT SURGERY    . INTRAVASCULAR PRESSURE WIRE/FFR STUDY N/A 04/15/2017   Procedure: INTRAVASCULAR PRESSURE WIRE/FFR STUDY; Belva Crome, MD; LAD = 0.74 after D2  . LEFT HEART CATH AND CORONARY ANGIOGRAPHY N/A 04/15/2017  . MANDIBLE FRACTURE SURGERY     . TRANSTHORACIC ECHOCARDIOGRAM  01/2017   Moderate LVH.  EF 60 to 65%.  No R WMA.  GR 1 DD. Mild AI.      Social History   reports that he has been smoking e-cigarettes. He has never used smokeless tobacco. He reports current alcohol use. He reports that he does not use drugs.   Family History   His family history includes Kidney disease in his sister; Stroke in his father and mother.   Allergies Allergies  Allergen Reactions  . Penicillins Other (See Comments)    Childhood reaction Has patient had a PCN reaction causing immediate rash, facial/tongue/throat swelling, SOB or lightheadedness with hypotension: Unknown Has patient had a PCN reaction causing severe rash involving mucus membranes or skin necrosis: Unknown Has patient had a PCN reaction that required hospitalization: No Has patient had a PCN reaction occurring within the last 10 years: No If all of the above answers are "NO", then may proceed with Cephalosporin use.      Home Medications  Prior to Admission medications   Medication Sig Start Date End Date Taking? Authorizing Provider  atorvastatin (LIPITOR) 80 MG tablet Take 1 tablet (80 mg total) by mouth daily at 6 PM. 12/28/18  Yes Newlin, Enobong, MD  losartan (COZAAR) 100 MG tablet Take 1 tablet (100 mg total) by mouth daily. 12/28/18  Yes Charlott Rakes, MD  metoprolol tartrate (LOPRESSOR) 25 MG tablet Take 1 tablet (25 mg total) by mouth 2 (two) times daily. 12/28/18  Yes Charlott Rakes, MD  Multiple Vitamins-Minerals (CENTRUM) tablet Take 1 tablet by mouth daily.    Yes [provider]  nitroGLYCERIN (NITROSTAT) 0.4 MG SL tablet Place 1 tablet (0.4 mg total) under the tongue every 5 (five) minutes as needed for chest pain. 11/13/18  Yes Hilty, Nadean Corwin, MD  omeprazole (PRILOSEC OTC) 20 MG tablet Take 1 tablet (20 mg total) by mouth daily. 12/28/18  Yes Charlott Rakes, MD  polycarbophil (FIBERCON) 625 MG tablet Take 625 mg by mouth daily.   Yes [provider]  clotrimazole-betamethasone (LOTRISONE) cream Apply 1 application topically 2 (two) times daily. Patient not taking: Reported on 03/30/2019 11/24/18   Charlott Rakes, MD  doxycycline (VIBRA-TABS) 100 MG tablet Take 1 tablet (100 mg total) by mouth 2 (two) times daily. Patient not taking: Reported on 03/30/2019 11/17/18   Ladell Pier, MD  furosemide (LASIX) 40 MG tablet Take 1 tablet (40 mg total) by mouth daily. 12/28/18 03/28/19  Charlott Rakes, MD     Critical care time: 80 min  The patient is critically ill with multiple organ systems failure and requires high complexity decision making for assessment and support, frequent evaluation and titration of therapies, application of advanced monitoring technologies and extensive interpretation of multiple databases.   Critical Care Time devoted to patient care services described in this note is 80 Minutes. Discussed and coordinated care with primary team, nephrology, RT and RNs.  Rodman Pickle, M.D. [facility]monary/Critical Care Medicine 04/01/2019 5:57 PM  Pager: [phone] After hours pager: [phone]

## 2019-04-01 NOTE — Progress Notes (Signed)
PROGRESS NOTE    Richard Calhoun  [id number] DOB: Mar 25, 1961 DOA: 03/30/2019 PCP: Charlott Rakes, MD    Brief Narrative:58white male [place] dwelling known chronic ethanol habituation half-gallon RuM daily, chronic tobacco use, CAD with subtotal CTO + RCA atherectomy and PCI of LAD-unstable angina 2018 status post cath staged atherectomy with drug-eluting stent placement at that time Chronic heart failure, hyperlipidemia, mild peripheral vascular disease, paroxysmal tachycardia  At the time of his admission in 2018 when he had a stent placement he needed Precedex  Comes into the hospital over the past several days with increasing malaise-actually vomited a lot of bright red blood yesterday he states it was bright red in consistency and has not eaten in the past 3 days He does occasionally vape and used to be a heavy smoker previous to this He continues to drink alcohol He cannot recall any falls  Because of diagnostic uncertainty on the part of the emergency room physician a CT abdomen pelvis was done---it appears that they did not think that this was a trauma case therefore trauma series was not performed---I am obtaining a secondhand report from Dr. Rex Kras as the attending physician who saw the patient initially has left  CT of the abdomen and pelvis-Unusual appearance of the tail of the pancreas, poorly characterized on today's noncontrast CT examination. This could represent a complex pancreatic pseudocyst related to pancreatitis, however, the possibility of underlying mass is not entirely excluded. The appearance is less likely related to trauma. Further characterization with pancreatic protocol CT scan or abdominal MRI with and without IV gadolinium with MRCP is recommended to exclude underlying malignancy. No definite signs of significant acute traumatic injury in the chest, abdomen or pelvis.  Multiple prominent borderline enlarged and mildly enlarged retroperitoneal  lymph nodes and mildly enlarged subcarinal lymph node. This is nonspecific and should be correlated with results of forthcoming abdominal scan to evaluate the pancreas, as metastatic disease is not excluded.  Probable post infectious or inflammatory scarring or atelectasis in the right lower lobe.Cholelithiasis without evidence of acute cholecystitis at this time.  Hepatomegaly with severe hepatic steatosis.  Aortic atherosclerosis, in addition to left main and 3 vessel coronary artery disease. Please note that although the presence of coronary artery calcium documents the presence of coronary artery disease, the severity of this disease and any potential stenosis cannot be assessed on this non-gated CT examination. Assessment for potential risk factor modification, dietary therapy or pharmacologic therapy may be warranted, if clinically indicated Assessment & Plan:   Active Problems:   Anxiety state   EtOH dependence (HCC)   COLONIC POLYPS, HX OF   Infected sebaceous cyst of skin   Hypertensive heart disease without heart failure   Tachycardia   Chest pain on exertion   S/P drug eluting coronary stent placement   Upper GI bleed   Acute renal failure (HCC)   Alcoholic hepatitis without ascites   #1 high gap metabolic acidosis unclear etiology-ethanol level pending.  Alcohol level less than 10.  Lactic acid level 1.3.  No signs of uremia creatinine 1.78.  No signs of DKA.  He does have ketones in his urine.  Salicylate acetaminophen levels pending.  Discussed with significant other who reported that patient went into depression after death of someone in the family and has not been eating or drinking well for many weeks.  Question if this is related to starvation.  #2 hepatic encephalopathy with elevated ammonia level we will start lactulose once NG tube placed.  Patient is no not awake or alert enough to take anything p.o.  #3 hematemesis in the setting of thrombocytopenia and  alcohol abuse and alcoholic hepatitis.  Continue Protonix and octreotide.  No plans for EGD at this time.  No further bleeding hematemesis or melena reported.  Hemoglobin 8.8 and platelets 27.  #4 AKI his creatinine has improved with hydration creatinine today is 1.78.  #5 hyponatremia change IV fluids to half-normal saline he has been getting normal saline.  #6 abnormal LFTs secondary to alcohol hepatitis CMP from today pending.  #7pancreatic tail lesion?  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  Patient will need further work-up with an MRI of the abdomen to evaluate this.  Will wait for renal functions to get better.  #8 alcohol abuse patient with ongoing need for Ativan remains tachycardic however this morning he arose more confused.  He likely would need intubation to protect his airway.  I have consulted PCCM.  DVT prophylaxis: SCD due to GI bleed  code Status full code  family Communication: None  disposition Plan: Pending clinical improvement Consultants:   GI  Procedures: None Antimicrobials: None    Estimated body mass index is 28.12 kg/m as calculated from the following:   Height as of this encounter: 5\' 10"  (1.778 m).   Weight as of this encounter: 88.9 kg.     Subjective:  Confused responds only to pain very lethargic does not follow any commands  Chest x-ray shows increased patchy bibasilar opacities may represent atelectasis developing pneumonia or aspiration should be considered.  ABG from 1040 this morning pH 7.3 which is improved from 7.25 however PCO2 is below reportable PO2 is 199. Objective: Vitals:   04/01/19 0800 04/01/19 0801 04/01/19 1200 04/01/19 1242  BP: 126/90     Pulse: (!) 130   (!) 133  Resp: (!) 25   (!) 36  Temp: 100.3 F (37.9 C)  99 F (37.2 C)   TempSrc: Axillary  Axillary   SpO2: 100% 100%  99%  Weight:      Height:        Intake/Output Summary  (Last 24 hours) at 04/01/2019 1302 Last data filed at 04/01/2019 [id number] Gross per 24 hour  Intake 2805.72 ml  Output 3200 ml  Net -394.28 ml   Filed Weights   03/30/19 1800  Weight: 88.9 kg    Examination:  General exam confused lethargic not responding to commands Respiratory system: Scattered rhonchi in both lung fields to auscultation. Respiratory effort normal. Cardiovascular system: S1 & S2 heard, RRR. No JVD, murmurs, rubs, gallops or clicks. No pedal edema. Gastrointestinal system: Abdomen is nondistended, soft and nontender. No organomegaly or masses felt. Normal bowel sounds heard. Central nervous system: Does not follow any commands and confused  extremities: 1+ pitting edema. Skin: No rashes, lesions or ulcers  Data Reviewed: I have personally reviewed following labs and imaging studies  CBC: Recent Labs  Lab 03/30/19 0442 03/30/19 1600 03/31/19 0202 04/01/19 0755  WBC 8.9 8.5 6.9 9.4  HGB 8.5* 8.6* 8.0* 8.8*  HCT 26.0* 26.7* 26.3* 28.1*  MCV 116.6* 117.6* 122.9* 118.6*  PLT 39* 34* 27* 24*   Basic Metabolic Panel: Recent Labs  Lab 03/30/19 0442 03/30/19 1331 03/31/19 0202 04/01/19 0755  NA 139 141 140 152*  K 4.9 5.2* 4.9 4.5  CL 99 103 106 115*  CO2 13* 12* 8* 9*  GLUCOSE 86 78 102* 190*  BUN 72* 68* 64*  54*  CREATININE 3.67* 2.88* 2.39* 1.78*  CALCIUM 7.5* 7.0* 6.8* 6.9*   GFR: Estimated Creatinine Clearance: 49.6 mL/min (A) (by C-G formula based on SCr of 1.78 mg/dL (H)). Liver Function Tests: Recent Labs  Lab 03/30/19 0442 03/30/19 1331 03/31/19 0202  AST 183* 190* 187*  ALT 73* 72* 76*  ALKPHOS 311* 315* 311*  BILITOT 2.6* 3.1* 4.4*  PROT 7.3 7.1 7.1  ALBUMIN 3.1* 2.9* 2.8*   Recent Labs  Lab 03/30/19 0442  LIPASE 64*   Recent Labs  Lab 04/01/19 1134  AMMONIA 98*   Coagulation Profile: Recent Labs  Lab 03/30/19 1331 03/31/19 0202 04/01/19 0257  INR 1.3* 1.5* 1.5*   Cardiac Enzymes: No results for input(s): CKTOTAL,  CKMB, CKMBINDEX, TROPONINI in the last 168 hours. BNP (last 3 results) No results for input(s): PROBNP in the last 8760 hours. HbA1C: No results for input(s): HGBA1C in the last 72 hours. CBG: Recent Labs  Lab 03/31/19 2330  GLUCAP 181*   Lipid Profile: No results for input(s): CHOL, HDL, LDLCALC, TRIG, CHOLHDL, LDLDIRECT in the last 72 hours. Thyroid Function Tests: No results for input(s): TSH, T4TOTAL, FREET4, T3FREE, THYROIDAB in the last 72 hours. Anemia Panel: No results for input(s): VITAMINB12, FOLATE, FERRITIN, TIBC, IRON, RETICCTPCT in the last 72 hours. Sepsis Labs: Recent Labs  Lab 04/01/19 1130  LATICACIDVEN 1.3    Recent Results (from the past 240 hour(s))  SARS Coronavirus 2 [facility] order, Performed in [facility] hospital lab) Nasopharyngeal Nasopharyngeal Swab     Status: None   Collection Time: 03/30/19  9:47 AM   Specimen: Nasopharyngeal Swab  Result Value Ref Range Status   SARS Coronavirus 2 NEGATIVE NEGATIVE Final    Comment: (NOTE) If result is NEGATIVE SARS-CoV-2 target nucleic acids are NOT DETECTED. The SARS-CoV-2 RNA is generally detectable in upper and lower  respiratory specimens during the acute phase of infection. The lowest  concentration of SARS-CoV-2 viral copies this assay can detect is 250  copies / mL. A negative result does not preclude SARS-CoV-2 infection  and should not be used as the sole basis for treatment or other  patient management decisions.  A negative result may occur with  improper specimen collection / handling, submission of specimen other  than nasopharyngeal swab, presence of viral mutation(s) within the  areas targeted by this assay, and inadequate number of viral copies  (<250 copies / mL). A negative result must be combined with clinical  observations, patient history, and epidemiological information. If result is POSITIVE SARS-CoV-2 target nucleic acids are DETECTED. The SARS-CoV-2 RNA is generally detectable  in upper and lower  respiratory specimens dur ing the acute phase of infection.  Positive  results are indicative of active infection with SARS-CoV-2.  Clinical  correlation with patient history and other diagnostic information is  necessary to determine patient infection status.  Positive results do  not rule out bacterial infection or co-infection with other viruses. If result is PRESUMPTIVE POSTIVE SARS-CoV-2 nucleic acids MAY BE PRESENT.   A presumptive positive result was obtained on the submitted specimen  and confirmed on repeat testing.  While 2019 novel coronavirus  (SARS-CoV-2) nucleic acids may be present in the submitted sample  additional confirmatory testing may be necessary for epidemiological  and / or clinical management purposes  to differentiate between  SARS-CoV-2 and other Sarbecovirus currently known to infect humans.  If clinically indicated additional testing with an alternate test  methodology [phone]) is advised. The SARS-CoV-2 RNA is generally  detectable in upper and lower respiratory sp ecimens during the acute  phase of infection. The expected result is Negative. Fact Sheet for Patients:  StrictlyIdeas.no Fact Sheet for Healthcare Providers: BankingDealers.co.za This test is not yet approved or cleared by the [place] FDA and has been authorized for detection and/or diagnosis of SARS-CoV-2 by FDA under an Emergency Use Authorization (EUA).  This EUA will remain in effect (meaning this test can be used) for the duration of the COVID-19 declaration under Section 564(b)(1) of the Act, 21 U.S.C. section 360bbb-3(b)(1), unless the authorization is terminated or revoked sooner. Performed at [facility], [place] [address] [place], [place] [address]   MRSA PCR Screening     Status: None   Collection Time: 03/30/19  5:55 PM   Specimen: Nasal Mucosa; Nasopharyngeal  Result Value Ref Range  Status   MRSA by PCR NEGATIVE NEGATIVE Final    Comment:        The GeneXpert MRSA Assay (FDA approved for NASAL specimens only), is one component of a comprehensive MRSA colonization surveillance program. It is not intended to diagnose MRSA infection nor to guide or monitor treatment for MRSA infections. Performed at [facility], [place] [address] [place], [place] [address]          Radiology Studies: Dg Chest Port 1 View  Result Date: 04/01/2019 CLINICAL DATA:  Tachypnea. EXAM: PORTABLE CHEST 1 VIEW COMPARISON:  Chest CT yesterday, radiograph 04/14/2017 FINDINGS: Increasing patchy bibasilar opacities. Heart is normal in size. Normal mediastinal contours. No pleural fluid or pneumothorax. No osseous abnormalities. IMPRESSION: Increasing patchy bibasilar opacities may represent atelectasis. Developing pneumonia or aspiration are also considered. Electronically Signed   By: Keith Rake M.D.   On: 04/01/2019 00:35        Scheduled Meds:  Chlorhexidine Gluconate Cloth  6 each Topical Daily   fentaNYL (SUBLIMAZE) injection  100 mcg Intravenous Once   LORazepam  0-4 mg Intravenous Q12H   Or   LORazepam  0-4 mg Oral Q12H   mouth rinse  15 mL Mouth Rinse BID   metoprolol tartrate  25 mg Oral BID   pantoprazole (PROTONIX) IV  40 mg Intravenous Q12H   sodium chloride flush  3 mL Intravenous Q12H   thiamine  100 mg Oral Daily   Or   thiamine  100 mg Intravenous Daily   Continuous Infusions:  sodium chloride 20 mL (04/01/19 1300)   fentaNYL infusion INTRAVENOUS     octreotide  (SANDOSTATIN)    IV infusion Stopped (04/01/19 [id number])   phenylephrine     phenylephrine (NEO-SYNEPHRINE) Adult infusion       LOS: 2 days     Georgette Shell, MD [facility]  If 7PM-7AM, please contact night-coverage [email or web address] Password [email or web address] 04/01/2019, 1:02 PM

## 2019-04-01 NOTE — Procedures (Signed)
Intubation Procedure Note FITZPATRICK ALBERICO [id number] [birth year]  Procedure: Intubation Indications: Airway protection and maintenance  Procedure Details Consent: Unable to obtain consent because of emergent medical necessity. Primary team contacted wife who is aware patient is transferred to Critical Care Service Time Out: Verified patient identification, verified procedure, site/side was marked, verified correct patient position, special equipment/implants available, medications/allergies/relevent history reviewed, required imaging and test results available.  Performed  Maximum sterile technique was used including gloves, gown, hand hygiene and mask.  MAC  3. Grade I view. Dried blood and mucous throughout the pharynx. ETT visualized passing through vocal cords  Evaluation Hemodynamic Status: BP stable throughout; O2 sats: stable throughout Patient's Current Condition: stable Complications: No apparent complications Patient did tolerate procedure well. Chest X-ray ordered to verify placement.  CXR: tube position acceptable.   Aletha Allebach Rodman Pickle 04/01/2019

## 2019-04-01 NOTE — Progress Notes (Signed)
     Progress Note    ASSESSMENT AND PLAN:   42. 58 yo male with Etoh abuse admitted with isolated episode of hematemesis. No further episodes. Noted to have significant thrombocytopenia, no evidence for cirrhosis on CT AP. Concerned for risk of Etoh withdrawal we have been holding off on EGD.  Hgb stable at 8.8.  -EGD when stable.  Continue IV PPI. Continue Octreotide -no further bleeding but is at risk with platelet count of 24, mild coagulopathy. Will give dose of IV Vitamin K  2. Acute respiratory failure / decreased LOC . Placed on BiPap last night. Now getting ready to be intubated.    3. Probable Etoh hepatitis. Bili continues to rise.  -am Liver test  4. Pancreatic lesion on non-contrast CT AP. Normal CA 19-9 -Eventual MRI   SUBJECTIVE   Tachycardia / tachypnea during the night. On BiPap, in process of being intubated now  OBJECTIVE:     Vital signs in last 24 hours: Temp:  [98.1 F (36.7 C)-100.3 F (37.9 C)] 100.3 F (37.9 C) (09/13 0800) Pulse Rate:  [30-151] 130 (09/13 0800) Resp:  [22-32] 25 (09/13 0800) BP: (101-151)/(70-113) 126/90 (09/13 0800) SpO2:  [88 %-100 %] 100 % (09/13 0801) FiO2 (%):  [40 %] 40 % (09/13 0801) Last BM Date: 03/31/19  General:   well-developed male, Not following commands at this point. Staff in room getting ready to intubate Heart:  Sinus tachycardia  Pulm: On BiPap.  Intake/Output from previous day: 09/12 0701 - 09/13 0700 In: 2808.7 [I.V.:2808.7] Out: 4200 [Urine:4200] Intake/Output this shift: No intake/output data recorded.  Lab Results: Recent Labs    03/30/19 1600 03/31/19 0202 04/01/19 0755  WBC 8.5 6.9 9.4  HGB 8.6* 8.0* 8.8*  HCT 26.7* 26.3* 28.1*  PLT 34* 27* 24*   BMET Recent Labs    03/30/19 1331 03/31/19 0202 04/01/19 0755  NA 141 140 152*  K 5.2* 4.9 4.5  CL 103 106 115*  CO2 12* 8* 9*  GLUCOSE 78 102* 190*  BUN 68* 64* 54*  CREATININE 2.88* 2.39* 1.78*  CALCIUM 7.0* 6.8* 6.9*   LFT  Recent Labs    03/31/19 0202  PROT 7.1  ALBUMIN 2.8*  AST 187*  ALT 76*  ALKPHOS 311*  BILITOT 4.4*   PT/INR Recent Labs    03/31/19 0202 04/01/19 0257  LABPROT 17.5* 17.4*  INR 1.5* 1.5*   Hepatitis Panel No results for input(s): HEPBSAG, HCVAB, HEPAIGM, HEPBIGM in the last 72 hours.  Dg Chest Port 1 View  Result Date: 04/01/2019 CLINICAL DATA:  Tachypnea. EXAM: PORTABLE CHEST 1 VIEW COMPARISON:  Chest CT yesterday, radiograph 04/14/2017 FINDINGS: Increasing patchy bibasilar opacities. Heart is normal in size. Normal mediastinal contours. No pleural fluid or pneumothorax. No osseous abnormalities. IMPRESSION: Increasing patchy bibasilar opacities may represent atelectasis. Developing pneumonia or aspiration are also considered. Electronically Signed   By: Keith Rake M.D.   On: 04/01/2019 00:35    Active Problems:   Anxiety state   EtOH dependence (HCC)   COLONIC POLYPS, HX OF   Infected sebaceous cyst of skin   Hypertensive heart disease without heart failure   Tachycardia   Chest pain on exertion   S/P drug eluting coronary stent placement   Upper GI bleed   Acute renal failure ([place])   Alcoholic hepatitis without ascites     LOS: 2 days   Tye Savoy ,NP 04/01/2019, 10:55 AM

## 2019-04-01 NOTE — Progress Notes (Addendum)
CRITICAL VALUE ALERT  Critical Value:  Calcium 6.4  Date & Time Notied:  04/01/2019 22:00  Provider Notified: E-link RN Kathlee Nations (stated she will let E-link provider know)  Orders Received/Actions taken: 1 gram of Calcium gluconate IV

## 2019-04-01 NOTE — Progress Notes (Signed)
Pharmacy Brief Note:  Followed up with [facility] regarding open case for potential rubbing alcohol ingestion.   Patient received a dose of fomepizole 15 mg/kg IV once this evening.   Ethylene glycol level @ 11:34 = <5  Case discussed with [facility], recommending no further doses of fomepizole at this time.   Recommended thiamine 500 mg IV once followed by 50 mg IV q6h. Ordered per discussion w/MD.  Lenis Noon, PharmD 04/01/19 8:01 PM

## 2019-04-01 NOTE — Progress Notes (Addendum)
Pulmonary Progress Note  [place] regarding patient potentially drinking rubbing alcohol in the setting of severe metabolic acidosis with elevated osmolal gap.  Discussed clinical course with toxicologist. Started on fomepizole. Returned call for toxin panel results.  Volatile panel positive for acetone level 0.046. Ethylene glycol, methanol, ethanol and isopropanol negative. Will discontinue fomepizole and continue supportive care. Discussed with pharmacy.  Additional Critical Care Time: 30 min  Rodman Pickle, M.D. [facility] Pulmonary/Critical Care Medicine 04/01/2019 6:48 PM

## 2019-04-01 NOTE — Progress Notes (Signed)
Paged by bedside RN regarding patient's increased O2 demands, tachypnea, and decreased LOC.  Patient was previously on room air after receiving a bath started requiring a nonrebreather to maintain O2 sats around 98%.  Upon entering the room patient is lethargic and tachypneic with respiratory rate in the 30s.  Awakens to sternal rub but does not follow commands.  An ABG was drawn showing a pH of 7.25, PCO2 with below reportable level, PO2 was 187, and O2 sat was 98.7.  Patient more arousable now responding to voice but still not following commands.  Patient was placed on a BiPAP which helped ease tachypnea and effort of respirations.  X-ray obtained showing "increasing patchy bibasilar opacities which may read present Alic stasis developing pneumonia or aspiration."  Patient resting on BiPAP at this time O2 sat was 99.  Leave the patient on the BiPAP as long as he tolerates.  Patient tachycardic into the 130s as well has metoprolol PRN ordered.  We will also skip the next scheduled dose of Ativan.  We will continue to monitor closely  Arby Barrette, APRN-C [facility] Pager [phone]

## 2019-04-01 NOTE — Progress Notes (Signed)
[place] Progress Note Patient Name: Richard Calhoun DOB: 08-22-1960 MRN: [id number]   Date of Service  04/01/2019  HPI/Events of Note  Multiple issues: 1. K+ = 3.1, Ca++ = 6.4 Na+ = 155, Glucose = 214 and Creatinine = 2.05.   eICU Interventions  Will order: 1. Replace K+ and Ca++. 2. 0.45 NaCl to run IV at 75 mL/hour.      Intervention Category Major Interventions: Electrolyte abnormality - evaluation and management;Hyperglycemia - active titration of insulin therapy  Lysle Dingwall 04/01/2019, 10:14 PM

## 2019-04-02 ENCOUNTER — Inpatient Hospital Stay (HOSPITAL_COMMUNITY): Payer: Self-pay

## 2019-04-02 DIAGNOSIS — J9601 Acute respiratory failure with hypoxia: Secondary | ICD-10-CM

## 2019-04-02 DIAGNOSIS — K7030 Alcoholic cirrhosis of liver without ascites: Secondary | ICD-10-CM

## 2019-04-02 DIAGNOSIS — Z452 Encounter for adjustment and management of vascular access device: Secondary | ICD-10-CM

## 2019-04-02 DIAGNOSIS — I959 Hypotension, unspecified: Secondary | ICD-10-CM

## 2019-04-02 LAB — GLUCOSE, CAPILLARY
Glucose-Capillary: 147 mg/dL — ABNORMAL HIGH (ref 70–99)
Glucose-Capillary: 139 mg/dL — ABNORMAL HIGH (ref 70–99)
Glucose-Capillary: 166 mg/dL — ABNORMAL HIGH (ref 70–99)
Glucose-Capillary: 143 mg/dL — ABNORMAL HIGH (ref 70–99)
Glucose-Capillary: 141 mg/dL — ABNORMAL HIGH (ref 70–99)
Glucose-Capillary: 128 mg/dL — ABNORMAL HIGH (ref 70–99)

## 2019-04-02 LAB — BASIC METABOLIC PANEL
Anion gap: 15 (ref 5–15)
Anion gap: 15 (ref 5–15)
Anion gap: 9 (ref 5–15)
Anion gap: 9 (ref 5–15)
BUN: 46 mg/dL — ABNORMAL HIGH (ref 6–20)
BUN: 54 mg/dL — ABNORMAL HIGH (ref 6–20)
BUN: 42 mg/dL — ABNORMAL HIGH (ref 6–20)
BUN: 39 mg/dL — ABNORMAL HIGH (ref 6–20)
CO2: 20 mmol/L — ABNORMAL LOW (ref 22–32)
CO2: 21 mmol/L — ABNORMAL LOW (ref 22–32)
CO2: 22 mmol/L (ref 22–32)
CO2: 22 mmol/L (ref 22–32)
Calcium: 5.9 mg/dL — CL (ref 8.9–10.3)
Calcium: 6.4 mg/dL — CL (ref 8.9–10.3)
Calcium: 6.2 mg/dL — CL (ref 8.9–10.3)
Calcium: 6.3 mg/dL — CL (ref 8.9–10.3)
Chloride: 120 mmol/L — ABNORMAL HIGH (ref 98–111)
Chloride: 119 mmol/L — ABNORMAL HIGH (ref 98–111)
Chloride: 118 mmol/L — ABNORMAL HIGH (ref 98–111)
Chloride: 118 mmol/L — ABNORMAL HIGH (ref 98–111)
Creatinine, Ser: 1.62 mg/dL — ABNORMAL HIGH (ref 0.61–1.24)
Creatinine, Ser: 1.88 mg/dL — ABNORMAL HIGH (ref 0.61–1.24)
Creatinine, Ser: 1.4 mg/dL — ABNORMAL HIGH (ref 0.61–1.24)
Creatinine, Ser: 1.27 mg/dL — ABNORMAL HIGH (ref 0.61–1.24)
GFR calc Af Amer: 53 mL/min — ABNORMAL LOW (ref >60)
GFR calc Af Amer: 44 mL/min — ABNORMAL LOW (ref >60)
GFR calc Af Amer: >60 mL/min (ref >60)
GFR calc Af Amer: >60 mL/min (ref >60)
GFR calc non Af Amer: 45 mL/min — ABNORMAL LOW (ref >60)
GFR calc non Af Amer: 38 mL/min — ABNORMAL LOW (ref >60)
GFR calc non Af Amer: 54 mL/min — ABNORMAL LOW (ref >60)
GFR calc non Af Amer: >60 mL/min (ref >60)
Glucose, Bld: 157 mg/dL — ABNORMAL HIGH (ref 70–99)
Glucose, Bld: 162 mg/dL — ABNORMAL HIGH (ref 70–99)
Glucose, Bld: 154 mg/dL — ABNORMAL HIGH (ref 70–99)
Glucose, Bld: 151 mg/dL — ABNORMAL HIGH (ref 70–99)
Potassium: 2.8 mmol/L — ABNORMAL LOW (ref 3.5–5.1)
Potassium: 3.3 mmol/L — ABNORMAL LOW (ref 3.5–5.1)
Potassium: 3.2 mmol/L — ABNORMAL LOW (ref 3.5–5.1)
Potassium: 3.5 mmol/L (ref 3.5–5.1)
Sodium: 155 mmol/L — ABNORMAL HIGH (ref 135–145)
Sodium: 155 mmol/L — ABNORMAL HIGH (ref 135–145)
Sodium: 149 mmol/L — ABNORMAL HIGH (ref 135–145)
Sodium: 149 mmol/L — ABNORMAL HIGH (ref 135–145)

## 2019-04-02 LAB — COMPREHENSIVE METABOLIC PANEL
ALT: 92 U/L — ABNORMAL HIGH (ref 0–44)
AST: 177 U/L — ABNORMAL HIGH (ref 15–41)
Albumin: 2.5 g/dL — ABNORMAL LOW (ref 3.5–5.0)
Alkaline Phosphatase: 280 U/L — ABNORMAL HIGH (ref 38–126)
Anion gap: 14 (ref 5–15)
BUN: 46 mg/dL — ABNORMAL HIGH (ref 6–20)
CO2: 22 mmol/L (ref 22–32)
Calcium: 6.7 mg/dL — ABNORMAL LOW (ref 8.9–10.3)
Chloride: 117 mmol/L — ABNORMAL HIGH (ref 98–111)
Creatinine, Ser: 1.54 mg/dL — ABNORMAL HIGH (ref 0.61–1.24)
GFR calc Af Amer: 56 mL/min — ABNORMAL LOW (ref >60)
GFR calc non Af Amer: 48 mL/min — ABNORMAL LOW (ref >60)
Glucose, Bld: 167 mg/dL — ABNORMAL HIGH (ref 70–99)
Potassium: 3.4 mmol/L — ABNORMAL LOW (ref 3.5–5.1)
Sodium: 153 mmol/L — ABNORMAL HIGH (ref 135–145)
Total Bilirubin: 3.8 mg/dL — ABNORMAL HIGH (ref 0.3–1.2)
Total Protein: 6.6 g/dL (ref 6.5–8.1)

## 2019-04-02 LAB — BLOOD GAS, VENOUS
Acid-base deficit: 0.7 mmol/L (ref 0.0–2.0)
Bicarbonate: 24.1 mmol/L (ref 20.0–28.0)
FIO2: 40
MECHVT: 430 mL
O2 Saturation: 95.3 %
PEEP: 5 cmH2O
Patient temperature: 98.6
RATE: 25 resp/min
pCO2, Ven: 42.7 mmHg — ABNORMAL LOW (ref 44.0–60.0)
pH, Ven: 7.371 (ref 7.250–7.430)
pO2, Ven: 81.3 mmHg — ABNORMAL HIGH (ref 32.0–45.0)

## 2019-04-02 LAB — BETA-HYDROXYBUTYRIC ACID
Beta-Hydroxybutyric Acid: 0.24 mmol/L (ref 0.05–0.27)
Beta-Hydroxybutyric Acid: 1.28 mmol/L — ABNORMAL HIGH (ref 0.05–0.27)
Beta-Hydroxybutyric Acid: 3.49 mmol/L — ABNORMAL HIGH (ref 0.05–0.27)
Beta-Hydroxybutyric Acid: 6.89 mmol/L — ABNORMAL HIGH (ref 0.05–0.27)

## 2019-04-02 LAB — CBC
HCT: 24.6 % — ABNORMAL LOW (ref 39.0–52.0)
Hemoglobin: 7.9 g/dL — ABNORMAL LOW (ref 13.0–17.0)
MCH: 38.7 pg — ABNORMAL HIGH (ref 26.0–34.0)
MCHC: 32.1 g/dL (ref 30.0–36.0)
MCV: 120.6 fL — ABNORMAL HIGH (ref 80.0–100.0)
Platelets: 38 10*3/uL — ABNORMAL LOW (ref 150–400)
RBC: 2.04 MIL/uL — ABNORMAL LOW (ref 4.22–5.81)
RDW: 16.6 % — ABNORMAL HIGH (ref 11.5–15.5)
WBC: 9.5 10*3/uL (ref 4.0–10.5)
nRBC: 0 % (ref 0.0–0.2)

## 2019-04-02 LAB — PROTIME-INR
INR: 1.3 — ABNORMAL HIGH (ref 0.8–1.2)
Prothrombin Time: 16.4 seconds — ABNORMAL HIGH (ref 11.4–15.2)

## 2019-04-02 MED ORDER — DEXMEDETOMIDINE HCL IN NACL 200 MCG/50ML IV SOLN
0.4000 ug/kg/h | INTRAVENOUS | Status: DC
  Administered 2019-04-02 (×2): 0.4 ug/kg/h via INTRAVENOUS
  Administered 2019-04-03: 08:00:00 0.6 ug/kg/h via INTRAVENOUS
  Administered 2019-04-03 (×3): 1 ug/kg/h via INTRAVENOUS
  Administered 2019-04-03: 13:00:00 0.8 ug/kg/h via INTRAVENOUS
  Administered 2019-04-04: 12:00:00 0.9 ug/kg/h via INTRAVENOUS
  Filled 2019-04-02 (×3): qty 50
  Filled 2019-04-02: qty 100
  Filled 2019-04-02 (×5): qty 50
  Filled 2019-04-02: qty 100
  Filled 2019-04-02 (×7): qty 50

## 2019-04-02 MED ORDER — LACTATED RINGERS IV BOLUS
1000.0000 mL | Freq: Once | INTRAVENOUS | Status: AC
  Administered 2019-04-02: 1000 mL via INTRAVENOUS

## 2019-04-02 MED ORDER — THIAMINE HCL 100 MG/ML IJ SOLN
100.0000 mg | Freq: Every day | INTRAMUSCULAR | Status: DC
  Administered 2019-04-03 – 2019-04-11 (×9): 100 mg via INTRAVENOUS
  Filled 2019-04-02 (×9): qty 2

## 2019-04-02 MED ORDER — POTASSIUM CHLORIDE 10 MEQ/50ML IV SOLN
10.0000 meq | INTRAVENOUS | Status: AC
  Administered 2019-04-02 (×4): 10 meq via INTRAVENOUS
  Filled 2019-04-02 (×4): qty 50

## 2019-04-02 MED ORDER — FOLIC ACID 5 MG/ML IJ SOLN
1.0000 mg | Freq: Every day | INTRAMUSCULAR | Status: DC
  Administered 2019-04-02 – 2019-04-13 (×12): 1 mg via INTRAVENOUS
  Filled 2019-04-02 (×12): qty 0.2

## 2019-04-02 MED ORDER — POTASSIUM CHLORIDE 10 MEQ/50ML IV SOLN
10.0000 meq | INTRAVENOUS | Status: AC
  Administered 2019-04-02 – 2019-04-03 (×4): 10 meq via INTRAVENOUS
  Filled 2019-04-02 (×4): qty 50

## 2019-04-02 MED ORDER — POTASSIUM CHLORIDE 10 MEQ/50ML IV SOLN: 10.0000 meq | INTRAVENOUS | Status: DC

## 2019-04-02 MED ORDER — POTASSIUM CHLORIDE 10 MEQ/100ML IV SOLN
10.0000 meq | INTRAVENOUS | Status: AC
  Administered 2019-04-02 (×4): 10 meq via INTRAVENOUS
  Filled 2019-04-02 (×4): qty 100

## 2019-04-02 MED ORDER — CALCIUM GLUCONATE-NACL 2-0.675 GM/100ML-% IV SOLN
2.0000 g | Freq: Once | INTRAVENOUS | Status: AC
  Administered 2019-04-02: 04:00:00 2000 mg via INTRAVENOUS
  Filled 2019-04-02: qty 100

## 2019-04-02 MED ORDER — CALCIUM GLUCONATE-NACL 2-0.675 GM/100ML-% IV SOLN
2.0000 g | Freq: Once | INTRAVENOUS | Status: AC
  Administered 2019-04-02: 2000 mg via INTRAVENOUS
  Filled 2019-04-02: qty 100

## 2019-04-02 MED ORDER — LACTULOSE 10 GM/15ML PO SOLN
10.0000 g | Freq: Two times a day (BID) | ORAL | Status: DC
  Administered 2019-04-02 (×2): 10 g via NASOGASTRIC
  Filled 2019-04-02 (×2): qty 15

## 2019-04-02 NOTE — Progress Notes (Addendum)
CRITICAL VALUE ALERT  Critical Value:  Calcium 6.4  Date & Time Notied:  04/02/2019 0345  Provider Notified: Jenita Seashore RN notified (stated will get orders from provider)  Orders Received/Actions taken: Calcium gluconate 2g/128mL sodium chloride IVPB

## 2019-04-02 NOTE — Progress Notes (Signed)
Initial Nutrition Assessment  DOCUMENTATION CODES:   Not applicable  INTERVENTION:  - if patient to remain intubated >/= 24 hours, recommend Vital AF 1.2 @ 20 ml/hr to advance by 10 mL every 8 hours to goal rate of 60 ml/hr with 30 ml prostat BID. - at goal rate, this regimen will provide 1928 kcal, 138 grams protein, and 1168 ml free water.    Monitor magnesium, potassium, and phosphorus daily for at least 3 days, MD to replete as needed, as pt is at risk for refeeding syndrome given alcohol abuse, current hypokalemia.   NUTRITION DIAGNOSIS:   Inadequate oral intake related to inability to eat as evidenced by NPO status.  GOAL:   Patient will meet greater than or equal to 90% of their needs  MONITOR:   Vent status, Labs, Weight trends  REASON FOR ASSESSMENT:   Ventilator  ASSESSMENT:   58 year-old male with medical history of chronic alcohol abuse (half gallon of rum/day), CAD and PCI, unstable angina with stent placement in 2018, CHF, hyperlipidemia, mild peripheral vascular disease, and tachycardia. Patient presented to the ED on 9/11 d/t increasing malaise and increased sleeping duration x5 days, vomiting and poor PO intakes x3 days.  Patient was intubated yesterday at 1327. He remains intubated with OGT in place. Spoke with RN briefly outside of patient's room. No family/visitors present. Per chart review, current weight is 196 lb and weight on 4/29 was 208 lb. This indicates 12 lb weight loss (5.8% body weight) in the past 4.5 months; not significant for time frame. Order in place for 200 ml free water per OGT TID.   Per notes: - metabolic acidosis 2/2 alcoholic/starvation ketoacidosis - hypokalemia--improving with repletion - acute encephalopathy - hypotension 2/2 sedation--goal MAP >65 - anemia - hematemesis--none since admission - AKI - abnormal pancreas tail lesion seen on CT--CT or MRI when stable   Patient is currently intubated on ventilator support MV:  10.6 L/min Temp (24hrs), Avg:98.9 F (37.2 C), Min:98.4 F (36.9 C), Max:99.1 F (37.3 C) Propofol: none BP: 120/62 and MAP: 78   Labs reviewed; CBGs: 147 and 139 mg/dl, Na: 155 mmol/l, K: 3.3 mmol/l, Cl: 119 mmol/l, BUN: 54 mg/dl, creatinine: 1.88 mg/dl, Ca: 6.4 mg/dl, GFR: 30 ml/min.  Medications reviewed; 1 mg folic acid/day, sliding scale novolog, 10 g lactulose BID, 5 mg IV vitamin K x1 run 9/13, 10 mEq IV KCl x5 runs 9/13 and x4 runs 9/14, 1 amp sodium bicarb x1 on 9/13, 500 mg oral thiamine x1 dose 9/13, 50 mg IV thiamine x1 dose 9/13, 500 mg IV thiamine x1 dose 9/13, 50 mg IV thiamine QID. IVF; D5 @ 100 ml/hr (408 kcal). Drips; fentanyl @ 200 mcg/hr, neo @ 85 mcg/min.    NUTRITION - FOCUSED PHYSICAL EXAM:  completed; no muscle and no fat wasting.   Diet Order:   Diet Order            Diet clear liquid Room service appropriate? Yes; Fluid consistency: Thin  Diet effective now              EDUCATION NEEDS:   No education needs have been identified at this time  Skin:  Skin Assessment: Reviewed RN Assessment  Last BM:  9/14  Height:   Ht Readings from Last 1 Encounters:  03/30/19 5\' 10"  (1.778 m)    Weight:   Wt Readings from Last 1 Encounters:  03/30/19 88.9 kg    Ideal Body Weight:  75.4 kg  BMI:  Body  mass index is 28.12 kg/m.  Estimated Nutritional Needs:   Kcal:  1986 kcal  Protein:  107-133 grams  Fluid:  >/= 2 L/day     Jarome Matin, MS, RD, LDN, [facility] Inpatient Clinical Dietitian Pager # [phone] After hours/weekend pager # [phone]

## 2019-04-02 NOTE — Progress Notes (Signed)
Patient ID: Richard Calhoun, male   DOB: January 29, 1961, 58 y.o.   MRN: [id number]    Progress Note   Subjective  Day # 3 CC: ETOH hepatitis , encephalopathy, isolated episode hematemesis  Intubated,sedated  No further hematemesis  Labs- K= 3.3, Creat 1.88 Hepatitis panel INR -1.3 hgb 7.9,plts 38 LFT's -P   Objective   Vital signs in last 24 hours: Temp:  [98.4 F (36.9 C)-99.1 F (37.3 C)] 99 F (37.2 C) (09/14 0400) Pulse Rate:  [76-133] 86 (09/14 0845) Resp:  [11-36] 25 (09/14 0845) BP: (84-137)/(52-85) 111/59 (09/14 0845) SpO2:  [94 %-100 %] 96 % (09/14 0845) FiO2 (%):  [30 %-100 %] 30 % (09/14 0811) Last BM Date: 04/02/19 General:    White male in NAD, intubated and sedated Heart:  Regular rate and rhythm; no murmurs Lungs: Respirations even and unlabored, lungs CTA bilaterally Abdomen:  Soft, nontender and nondistended. Normal bowel sounds. Extremities:  Without edema. Neurologic: Intubated Psych: Debated and sedated  Intake/Output from previous day: 09/13 0701 - 09/14 0700 In: 4529 [I.V.:3662.1; IV Piggyback:866.9] Out: 575 [Urine:575] Intake/Output this shift: Total I/O In: 1001.2 [I.V.:743.2; IV Piggyback:258] Out: -   Lab Results: Recent Labs    03/30/19 1600 03/31/19 0202 04/01/19 0755  WBC 8.5 6.9 9.4  HGB 8.6* 8.0* 8.8*  HCT 26.7* 26.3* 28.1*  PLT 34* 27* 24*   BMET Recent Labs    04/01/19 1959 04/02/19 0100 04/02/19 0246  NA 155* 155* 155*  K 3.1* 2.8* 3.3*  CL 117* 120* 119*  CO2 19* 20* 21*  GLUCOSE 214* 157* 162*  BUN 57* 46* 54*  CREATININE 2.05* 1.62* 1.88*  CALCIUM 6.4* 5.9* 6.4*   LFT Recent Labs    04/01/19 1129  PROT 7.5  ALBUMIN 2.8*  AST 200*  ALT 97*  ALKPHOS 319*  BILITOT 5.4*   PT/INR Recent Labs    03/31/19 0202 04/01/19 0257  LABPROT 17.5* 17.4*  INR 1.5* 1.5*    Studies/Results: Dg Chest 1 View  Result Date: 04/01/2019 CLINICAL DATA:  Intubation. EXAM: CHEST  1 VIEW COMPARISON:  03/31/2019 FINDINGS:  Endotracheal tube has been inserted and is in good position approximately 6 cm above the carina. Heart size and vascularity are normal. Right lung is clear. New atelectasis at the left lung base. No acute bone abnormality. IMPRESSION: Endotracheal tube in good position. New atelectasis at the left lung base. Electronically Signed   By: Lorriane Shire M.D.   On: 04/01/2019 13:34   Dg Abd 1 View  Result Date: 04/01/2019 CLINICAL DATA:  NG tube placement. EXAM: ABDOMEN - 1 VIEW COMPARISON:  None. FINDINGS: Nasogastric tube is present with tip and side-port over the stomach in the left upper quadrant. Bowel gas pattern is nonobstructive with relative paucity of bowel gas over the mid to upper abdomen. No free peritoneal air. Minimal calcified plaque over the abdominal aorta. Bony structures are normal. IMPRESSION: Nonobstructive bowel gas pattern. Nasogastric tube with tip and side-port over the stomach in the left upper quadrant. Electronically Signed   By: Marin Olp M.D.   On: 04/01/2019 16:00   Dg Chest Port 1 View  Result Date: 04/01/2019 CLINICAL DATA:  Tachypnea. EXAM: PORTABLE CHEST 1 VIEW COMPARISON:  Chest CT yesterday, radiograph 04/14/2017 FINDINGS: Increasing patchy bibasilar opacities. Heart is normal in size. Normal mediastinal contours. No pleural fluid or pneumothorax. No osseous abnormalities. IMPRESSION: Increasing patchy bibasilar opacities may represent atelectasis. Developing pneumonia or aspiration are also considered. Electronically Signed  By: Keith Rake M.D.   On: 04/01/2019 00:35       Assessment / Plan:    #52 58 year old white male with history of chronic EtOH use admitted with altered mental status, complaints of abdominal pain nausea and vomiting at home and one episode of hematemesis. no evidence for active bleeding since admit  Hemoglobin 7.9-  Continue PPI infusion and empiric octreotide  He had progressive respiratory decline and is now intubated   Suspect underlying EtOH hepatitis, probable cirrhosis with hepatic encephalopathy and now component of withdrawal. Will need EGD this admit   #2 metabolic acidosis #3 acute encephalopathy in setting of acidosis and elevated ammonia  On lactulose, continue  #4 hypotension-on pressor #5 thrombocytopenia #6 coronary artery disease status post stent-was on antiplatelet prior to admission, on hold #7 abnormal pancreatic tail on CT-plan for MRI of the abdomen when patient more stable            Active Problems:   Anxiety state   EtOH dependence (HCC)   COLONIC POLYPS, HX OF   Infected sebaceous cyst of skin   Hypertensive heart disease without heart failure   Tachycardia   Chest pain on exertion   S/P drug eluting coronary stent placement   Upper GI bleed   Acute renal failure (HCC)   Alcoholic hepatitis without ascites   Flank pain   Tachypnea   Airway intubation performed without difficulty   Encephalopathy, hepatic ([place])     LOS: 3 days   Amy EsterwoodPA-C  04/02/2019, 8:56 AM

## 2019-04-02 NOTE — Progress Notes (Signed)
[facility] Progress Note  Subjective: creat down 1.4 today  Vitals:   04/02/19 1430 04/02/19 1445 04/02/19 1500 04/02/19 1519  BP: (!) 102/57 (!) 108/59 115/65   Pulse: 83 77 79   Resp: (!) 25 (!) 25 (!) 25   Temp:      TempSrc:      SpO2: 100% 100% 100% 97%  Weight:      Height:        Inpatient medications: . chlorhexidine gluconate (MEDLINE KIT)  15 mL Mouth Rinse BID  . Chlorhexidine Gluconate Cloth  6 each Topical Daily  . fentaNYL (SUBLIMAZE) injection  100 mcg Intravenous Once  . folic acid  1 mg Intravenous Daily  . free water  200 mL Per Tube Q8H  . insulin aspart  0-15 Units Subcutaneous Q4H  . lactulose  10 g Per NG tube BID  . LORazepam  0-4 mg Intravenous Q12H   Or  . LORazepam  0-4 mg Oral Q12H  . mouth rinse  15 mL Mouth Rinse 10 times per day  . pantoprazole (PROTONIX) IV  40 mg Intravenous Q12H  . sodium chloride flush  3 mL Intravenous Q12H  . [START ON 04/03/2019] thiamine injection  100 mg Intravenous Daily   . sodium chloride 20 mL/hr at 04/01/19 1304  . dexmedetomidine (PRECEDEX) IV infusion 0.4 mcg/kg/hr (04/02/19 1521)  . dextrose 100 mL/hr at 04/02/19 1508  . fentaNYL infusion INTRAVENOUS 200 mcg/hr (04/02/19 1623)  . octreotide  (SANDOSTATIN)    IV infusion 50 mcg/hr (04/02/19 1508)  . phenylephrine (NEO-SYNEPHRINE) Adult infusion 65 mcg/min (04/02/19 1508)  . potassium chloride     fentaNYL, metoprolol tartrate, promethazine, sodium chloride flush    Exam: General: Sedated NAD HEENT: MMM [place] AT anicteric sclera, orally intubated Neck:  No JVD, no adenopathy CV:  Heart RRR  Lungs:  L/S CTA bilaterally Abd:  abd SNT/ND with normal BS GU:  Bladder non-palpable, positive Foley Extremities: +1 bilateral lower extremity edema Skin:  No skin rash Psych: Sedated on the ventilator Neuro:  no focal deficits, sedated    Assessment/ Plan:   Acute kidney injury.  On admission, his serum creatinine was 3.67 due to vol  depletion, now improved down to 1.4 today.  He has had excellent urine output over the past 24 hours.  No further suggestions, will sign off.    High anion gap metabolic acidosis: volatile alcohol w/u was essentially negative, pt likely suffered from starvation ketosis due to etoh abuse. AG now closed and serum CO2 up to 22.   Hypernatremia - still has H2O deficit, but Na coming down to 149 this afternoon, would cont your regimen for this (hypotonic fluids).    Acute respiratory failure - per CCM   Alcohol abuse.  Agree with thiamine.  CIWA protocol.     Rob Doctor, hospital 04/02/2019, 4:40 PM  Iron/TIBC/Ferritin/ %Sat No results found for: IRON, TIBC, FERRITIN, IRONPCTSAT Recent Labs  Lab 04/02/19 0400 04/02/19 1000 04/02/19 1200  NA  --  153* 149*  K  --  3.4* 3.2*  CL  --  117* 118*  CO2  --  22 22  GLUCOSE  --  167* 154*  BUN  --  46* 42*  CREATININE  --  1.54* 1.40*  CALCIUM  --  6.7* 6.2*  ALBUMIN  --  2.5*  --   INR 1.3*  --   --    Recent Labs  Lab 04/02/19 1000  AST 177*  ALT 92*  ALKPHOS 280*  BILITOT 3.8*  PROT 6.6   Recent Labs  Lab 04/02/19 0400  WBC 9.5  HGB 7.9*  HCT 24.6*  PLT 38*

## 2019-04-02 NOTE — Progress Notes (Addendum)
NAME:  Richard Calhoun, MRN:  [id number], DOB:  07-23-1960, LOS: 3 ADMISSION DATE:  03/30/2019, CONSULTATION DATE:  04/01/19 REFERRING MD:  Landis Gandy, MD CHIEF COMPLAINT:  Acute respiratory failure  Brief History   58 year old male with alcohol abuse who presents hematemesis. Has had worsening mental status, tachypnea and oxygen requirement. Placed on BiPAP overnight. Transferred to PCCM for acute respiratory failure  History of present illness   Unable to obtain history due to altered mental status. Chart reviewed and staff spoke to wife for history.  Richard Calhoun is a 58 year old male with hx of alcohol abuse who presented to ED with abdominal pain, nausea and vomiting, and episode of hematemesis. Per his wife, she noticed he had a poor appetite at least for the last two weeks. He drinks 2-3 drinks with her each night. He has been more noticeably drowsy and sleeping continuously for the last five days. He began complaining of left-sided abdominal pain associated with nausea and vomiting for three days that progressively worsened to the point that it was unbearable. The day before he presented he had an episode of hematemesis. Wife does note low level bottle of rubbing alcohol.   Past Medical History  CAD s/p DES, HTN, HLD  Significant Hospital Events   9/11 Admitted on BiPAP  Consults:  PCCM  Procedures:  ETT 9/13>  Significant Diagnostic Tests:  CT CAP 9/13 - Mildly enlarged subcarinal and retroperitoneal lymph nodes, RLL scarring, hepatomegaly, abnormal pancreas tail lesion  Micro Data:    Antimicrobials:    Interim history/subjective:  Good UOP. Remains on Neo  Objective   Blood pressure (!) 115/59, pulse 86, temperature 99 F (37.2 C), temperature source Oral, resp. rate (!) 25, height 5\' 10"  (1.778 m), weight 88.9 kg, SpO2 99 %.    Vent Mode: PRVC FiO2 (%):  [40 %-100 %] 40 % Set Rate:  [8 bmp-25 bmp] 25 bmp Vt Set:  [430 mL] 430 mL PEEP:  [5 cmH20] 5 cmH20  Plateau Pressure:  [15 cmH20-20 cmH20] 15 cmH20   Intake/Output Summary (Last 24 hours) at 04/02/2019 0729 Last data filed at 04/02/2019 [id number] Gross per 24 hour  Intake 4528.97 ml  Output 575 ml  Net 3953.97 ml   Filed Weights   03/30/19 1800  Weight: 88.9 kg   Physical Exam: General: Chronically ill-appearing, sedated  HENT: [place], AT, ETT in place Eyes: EOMI, no scleral icterus Respiratory: Clear to auscultation bilaterally.  No crackles, wheezing or rales Cardiovascular: RRR, -M/R/G, no JVD GI: BS+, soft, nontender Extremities:-Edema,-tenderness Neuro: Sedated, does not open eyes, awakens to touch, purposeful movements, moves extremities x 4 [id number] cath in place  Resolved Hospital Problem list     Assessment & Plan:   Acute respiratory failure: Exacerbated by metabolic acidosis Continue vent support WUA/SBT as tolerated. Hold on extubation due to mental status and potential EGD VAP  Metabolic acidosis with elevated osmolal gap 2/2 alcoholic/starvation ketoacidosis - Volatile panel + acetone. DC fomepizole DC sodium bicarbonate Continue D5 Continue thiamine per poison control recommendations Trend BMP and B-hydroxybutyric acid Appreciate Nephrology input  Hypernatremia Trend BMP Continue D5  FWF  Hypokalemia Trend BMP Replete for goal >4  Acute encephalopathy: acidosis, hyperammonemia, electrolyte abnormalities. Drug screen +benzo post-hospitalization, ethanol <10 Continue lactulose Electrolyte management as above  Hypotension secondary to sedation. Normotensive pre-procedure Wean Neo for goal MAP >65 Consider fluid bolus trial If persistent or requires increased pressor support, consider cultures and abx for developing sepsis  Anemia, ?acute vs chronic Thrombocytopenia. Present on admission. Unclear if chronic. Last normal counts in 07/2016. Trend CBC Transfuse for goal Hg >7 and Plt 10  Hematemesis. None since admission Alcoholic hepatitis  Octreotide, PPI and vitamin K per GI. No steroids indicated GI following. Will discuss EGD while patient is intubated Continue thiamine and folate Trend INR F/u hepatitis panel  AKI: likely pre-renal. Good UOP Trend UOP/Cr  Abnormal pancreas tail lesion on CT When stable, will need pancreatic CT or abdominal MRI  CAD s/p stent 04/2017 Holding antiplatelet therapy  Best practice:  Diet: NPO Pain/Anxiety/Delirium protocol (if indicated): Fentanyl gtt VAP protocol (if indicated): Yes DVT prophylaxis: Holding in setting of thrombocytopenia GI prophylaxis: PPI Glucose control: CBG q4h Mobility: BR Code Status: Full code Family Communication: Updated "wife" [name] on 9/14 Disposition: ICU  Labs   CBC: Recent Labs  Lab 03/30/19 0442 03/30/19 1600 03/31/19 0202 04/01/19 0755  WBC 8.9 8.5 6.9 9.4  HGB 8.5* 8.6* 8.0* 8.8*  HCT 26.0* 26.7* 26.3* 28.1*  MCV 116.6* 117.6* 122.9* 118.6*  PLT 39* 34* 27* 24*    Basic Metabolic Panel: Recent Labs  Lab 04/01/19 0755 04/01/19 1129 04/01/19 1959 04/02/19 0100 04/02/19 0246  NA 152* 153* 155* 155* 155*  K 4.5 3.6 3.1* 2.8* 3.3*  CL 115* 114* 117* 120* 119*  CO2 9* 10* 19* 20* 21*  GLUCOSE 190* 202* 214* 157* 162*  BUN 54* 51* 57* 46* 54*  CREATININE 1.78* 1.72* 2.05* 1.62* 1.88*  CALCIUM 6.9* 6.8* 6.4* 5.9* 6.4*   GFR: Estimated Creatinine Clearance: 46.9 mL/min (A) (by C-G formula based on SCr of 1.88 mg/dL (H)). Recent Labs  Lab 03/30/19 0442 03/30/19 1600 03/31/19 0202 04/01/19 0755 04/01/19 1130 04/01/19 1412  WBC 8.9 8.5 6.9 9.4  --   --   LATICACIDVEN  --   --   --   --  1.3 0.7    Liver Function Tests: Recent Labs  Lab 03/30/19 0442 03/30/19 1331 03/31/19 0202 04/01/19 1129  AST 183* 190* 187* 200*  ALT 73* 72* 76* 97*  ALKPHOS 311* 315* 311* 319*  BILITOT 2.6* 3.1* 4.4* 5.4*  PROT 7.3 7.1 7.1 7.5  ALBUMIN 3.1* 2.9* 2.8* 2.8*   Recent Labs  Lab 03/30/19 0442  LIPASE 64*   Recent Labs   Lab 04/01/19 1134  AMMONIA 98*    ABG    Component Value Date/Time   PHART 7.249 (L) 04/01/2019 1338   PCO2ART 22.1 (L) 04/01/2019 1338   PO2ART 479 (H) 04/01/2019 1338   HCO3 24.1 04/02/2019 0219   TCO2 20 (L) 04/14/2017 1657   ACIDBASEDEF 0.7 04/02/2019 0219   O2SAT 95.3 04/02/2019 0219     Coagulation Profile: Recent Labs  Lab 03/30/19 1331 03/31/19 0202 04/01/19 0257  INR 1.3* 1.5* 1.5*    Cardiac Enzymes: No results for input(s): CKTOTAL, CKMB, CKMBINDEX, TROPONINI in the last 168 hours.  HbA1C: Hemoglobin A1C  Date/Time Value Ref Range Status  12/23/2016 12:01 PM 5.6  Final   Hgb A1c MFr Bld  Date/Time Value Ref Range Status  04/15/2017 02:34 AM 5.9 (H) 4.8 - 5.6 % Final    Comment:    (NOTE) Pre diabetes:          5.7%-6.4% Diabetes:              >6.4% Glycemic control for   <7.0% adults with diabetes   08/29/2012 03:20 PM 6.0 4.6 - 6.5 % Final    Comment:  Glycemic Control Guidelines for People with Diabetes:Non Diabetic:  <6%Goal of Therapy: <7%Additional Action Suggested:  >8%     CBG: Recent Labs  Lab 04/01/19 1824 04/01/19 1941 04/01/19 2332 04/02/19 0331 04/02/19 0718  GLUCAP 179* 200* 166* 147* 139*    Critical care time: 40 min    The patient is critically ill with multiple organ systems failure and requires high complexity decision making for assessment and support, frequent evaluation and titration of therapies, application of advanced monitoring technologies and extensive interpretation of multiple databases.   Critical Care Time devoted to patient care services described in this note is 40 Minutes. This time reflects time of care of this signee Dr. Rodman Pickle. This critical care time does not reflect procedure time, or teaching time or supervisory time of PA/NP/Med student/Med Resident etc but could involve care discussion time.  Rodman Pickle, M.D. [facility] Pulmonary/Critical Care Medicine 04/02/2019 7:29 AM  Pager:  [phone] After hours pager: [phone]

## 2019-04-02 NOTE — Progress Notes (Addendum)
CRITICAL VALUE ALERT  Critical Value:  Calcium 5.9 and Potassium 2.8  Date & Time Notied:  04/02/2019 0200  Provider Notified: E-Link Nurse Kathlee Nations (stated she would let Provider know)

## 2019-04-02 NOTE — Progress Notes (Signed)
CRITICAL VALUE ALERT  Critical Value:  Calcium 6.2   Date & Time Notied:  9/14 1549  Provider Notified: Loanne Drilling, MD   Orders Received/Actions taken: new lab orders

## 2019-04-02 NOTE — Procedures (Signed)
Central Venous Catheter Insertion Procedure Note Richard Calhoun [id number] 09-24-1960  Procedure: Insertion of Central Venous Catheter Indications: Drug and/or fluid administration Real time [place] used to ID and cannulate the vessel.  Procedure Details Consent: Risks of procedure as well as the alternatives and risks of each were explained to the (patient/caregiver).  Consent for procedure obtained. Time Out: Verified patient identification, verified procedure, site/side was marked, verified correct patient position, special equipment/implants available, medications/allergies/relevent history reviewed, required imaging and test results available.  Performed  Maximum sterile technique was used including antiseptics, cap, gloves, gown, hand hygiene, mask and sheet. Skin prep: Chlorhexidine; local anesthetic administered A antimicrobial bonded/coated triple lumen catheter was placed in the right internal jugular vein using the Seldinger technique.  Evaluation Blood flow good Complications: No apparent complications Patient did tolerate procedure well. Chest X-ray ordered to verify placement.  CXR: pending.  Clementeen Graham 04/02/2019, 2:15 PM  Erick Colace ACNP-BC [place] Pager # [phone] OR # [phone] if no answer

## 2019-04-02 NOTE — Progress Notes (Signed)
[place] Progress Note Patient Name: UZOMA THWING DOB: May 15, 1961 MRN: [id number]   Date of Service  04/02/2019  HPI/Events of Note  K+ = 3.5, Ca++ = 6.3 and Creatinine = 1.27.   eICU Interventions  Will replace K+ and Ca++.      Intervention Category Major Interventions: Electrolyte abnormality - evaluation and management  Sommer,Steven Eugene 04/02/2019, 9:29 PM

## 2019-04-02 NOTE — Progress Notes (Signed)
[place] Progress Note Patient Name: GOVIND LYSAGHT DOB: [birth year] MRN: [id number]   Date of Service  04/02/2019  HPI/Events of Note  K+ = 3.3, Ca++ = 6.4 and Creatinine = 1.88.  eICU Interventions  Will replace K+ and Calcium.     Intervention Category Major Interventions: Electrolyte abnormality - evaluation and management  Obe Ahlers Eugene 04/02/2019, 3:54 AM

## 2019-04-03 DIAGNOSIS — G9340 Encephalopathy, unspecified: Secondary | ICD-10-CM

## 2019-04-03 DIAGNOSIS — K769 Liver disease, unspecified: Secondary | ICD-10-CM

## 2019-04-03 DIAGNOSIS — D696 Thrombocytopenia, unspecified: Secondary | ICD-10-CM

## 2019-04-03 DIAGNOSIS — K920 Hematemesis: Secondary | ICD-10-CM

## 2019-04-03 DIAGNOSIS — E872 Acidosis: Secondary | ICD-10-CM

## 2019-04-03 LAB — PHOSPHORUS
Phosphorus: <1 mg/dL — CL (ref 2.5–4.6)
Phosphorus: 1.4 mg/dL — ABNORMAL LOW (ref 2.5–4.6)
Phosphorus: 1.4 mg/dL — ABNORMAL LOW (ref 2.5–4.6)

## 2019-04-03 LAB — GLUCOSE, CAPILLARY
Glucose-Capillary: 146 mg/dL — ABNORMAL HIGH (ref 70–99)
Glucose-Capillary: 164 mg/dL — ABNORMAL HIGH (ref 70–99)
Glucose-Capillary: 187 mg/dL — ABNORMAL HIGH (ref 70–99)
Glucose-Capillary: 154 mg/dL — ABNORMAL HIGH (ref 70–99)
Glucose-Capillary: 142 mg/dL — ABNORMAL HIGH (ref 70–99)
Glucose-Capillary: 146 mg/dL — ABNORMAL HIGH (ref 70–99)

## 2019-04-03 LAB — HEPATITIS PANEL, ACUTE
HCV Ab: 0.1 s/co ratio (ref 0.0–0.9)
Hep A IgM: NEGATIVE
Hep B C IgM: NEGATIVE
Hepatitis B Surface Ag: NEGATIVE

## 2019-04-03 LAB — BASIC METABOLIC PANEL
Anion gap: 13 (ref 5–15)
Anion gap: 7 (ref 5–15)
Anion gap: 9 (ref 5–15)
Anion gap: 9 (ref 5–15)
BUN: 29 mg/dL — ABNORMAL HIGH (ref 6–20)
BUN: 30 mg/dL — ABNORMAL HIGH (ref 6–20)
BUN: 30 mg/dL — ABNORMAL HIGH (ref 6–20)
BUN: 34 mg/dL — ABNORMAL HIGH (ref 6–20)
CO2: 19 mmol/L — ABNORMAL LOW (ref 22–32)
CO2: 20 mmol/L — ABNORMAL LOW (ref 22–32)
CO2: 20 mmol/L — ABNORMAL LOW (ref 22–32)
CO2: 21 mmol/L — ABNORMAL LOW (ref 22–32)
Calcium: 6.7 mg/dL — ABNORMAL LOW (ref 8.9–10.3)
Calcium: 6.8 mg/dL — ABNORMAL LOW (ref 8.9–10.3)
Calcium: 6.9 mg/dL — ABNORMAL LOW (ref 8.9–10.3)
Calcium: 7 mg/dL — ABNORMAL LOW (ref 8.9–10.3)
Chloride: 114 mmol/L — ABNORMAL HIGH (ref 98–111)
Chloride: 115 mmol/L — ABNORMAL HIGH (ref 98–111)
Chloride: 119 mmol/L — ABNORMAL HIGH (ref 98–111)
Chloride: 119 mmol/L — ABNORMAL HIGH (ref 98–111)
Creatinine, Ser: 0.96 mg/dL (ref 0.61–1.24)
Creatinine, Ser: 1 mg/dL (ref 0.61–1.24)
Creatinine, Ser: 1.01 mg/dL (ref 0.61–1.24)
Creatinine, Ser: 1.07 mg/dL (ref 0.61–1.24)
GFR calc Af Amer: >60 mL/min (ref >60)
GFR calc Af Amer: >60 mL/min (ref >60)
GFR calc Af Amer: >60 mL/min (ref >60)
GFR calc Af Amer: >60 mL/min (ref >60)
GFR calc non Af Amer: >60 mL/min (ref >60)
GFR calc non Af Amer: >60 mL/min (ref >60)
GFR calc non Af Amer: >60 mL/min (ref >60)
GFR calc non Af Amer: >60 mL/min (ref >60)
Glucose, Bld: 157 mg/dL — ABNORMAL HIGH (ref 70–99)
Glucose, Bld: 159 mg/dL — ABNORMAL HIGH (ref 70–99)
Glucose, Bld: 305 mg/dL — ABNORMAL HIGH (ref 70–99)
Glucose, Bld: 324 mg/dL — ABNORMAL HIGH (ref 70–99)
Potassium: 3.4 mmol/L — ABNORMAL LOW (ref 3.5–5.1)
Potassium: 3.7 mmol/L (ref 3.5–5.1)
Potassium: 6.1 mmol/L — ABNORMAL HIGH (ref 3.5–5.1)
Potassium: 6.1 mmol/L — ABNORMAL HIGH (ref 3.5–5.1)
Sodium: 143 mmol/L (ref 135–145)
Sodium: 146 mmol/L — ABNORMAL HIGH (ref 135–145)
Sodium: 148 mmol/L — ABNORMAL HIGH (ref 135–145)
Sodium: 148 mmol/L — ABNORMAL HIGH (ref 135–145)

## 2019-04-03 LAB — CBC
HCT: 24.7 % — ABNORMAL LOW (ref 39.0–52.0)
HCT: 23.8 % — ABNORMAL LOW (ref 39.0–52.0)
Hemoglobin: 7.7 g/dL — ABNORMAL LOW (ref 13.0–17.0)
Hemoglobin: 7.5 g/dL — ABNORMAL LOW (ref 13.0–17.0)
MCH: 37.9 pg — ABNORMAL HIGH (ref 26.0–34.0)
MCH: 38.3 pg — ABNORMAL HIGH (ref 26.0–34.0)
MCHC: 31.2 g/dL (ref 30.0–36.0)
MCHC: 31.5 g/dL (ref 30.0–36.0)
MCV: 121.7 fL — ABNORMAL HIGH (ref 80.0–100.0)
MCV: 121.4 fL — ABNORMAL HIGH (ref 80.0–100.0)
Platelets: 30 10*3/uL — ABNORMAL LOW (ref 150–400)
Platelets: 36 10*3/uL — ABNORMAL LOW (ref 150–400)
RBC: 2.03 MIL/uL — ABNORMAL LOW (ref 4.22–5.81)
RBC: 1.96 MIL/uL — ABNORMAL LOW (ref 4.22–5.81)
RDW: 16.2 % — ABNORMAL HIGH (ref 11.5–15.5)
RDW: 16.3 % — ABNORMAL HIGH (ref 11.5–15.5)
WBC: 6.2 10*3/uL (ref 4.0–10.5)
WBC: 5.6 10*3/uL (ref 4.0–10.5)
nRBC: 0 % (ref 0.0–0.2)
nRBC: 0 % (ref 0.0–0.2)

## 2019-04-03 LAB — PROTIME-INR
INR: 1.5 — ABNORMAL HIGH (ref 0.8–1.2)
Prothrombin Time: 17.8 seconds — ABNORMAL HIGH (ref 11.4–15.2)

## 2019-04-03 LAB — TYPE AND SCREEN
ABO/RH(D): A POS
Antibody Screen: NEGATIVE

## 2019-04-03 LAB — COMPREHENSIVE METABOLIC PANEL
ALT: 82 U/L — ABNORMAL HIGH (ref 0–44)
AST: 151 U/L — ABNORMAL HIGH (ref 15–41)
Albumin: 2.2 g/dL — ABNORMAL LOW (ref 3.5–5.0)
Alkaline Phosphatase: 262 U/L — ABNORMAL HIGH (ref 38–126)
Anion gap: 8 (ref 5–15)
BUN: 31 mg/dL — ABNORMAL HIGH (ref 6–20)
CO2: 21 mmol/L — ABNORMAL LOW (ref 22–32)
Calcium: 6.9 mg/dL — ABNORMAL LOW (ref 8.9–10.3)
Chloride: 120 mmol/L — ABNORMAL HIGH (ref 98–111)
Creatinine, Ser: 1.14 mg/dL (ref 0.61–1.24)
GFR calc Af Amer: >60 mL/min (ref >60)
GFR calc non Af Amer: >60 mL/min (ref >60)
Glucose, Bld: 166 mg/dL — ABNORMAL HIGH (ref 70–99)
Potassium: 3.7 mmol/L (ref 3.5–5.1)
Sodium: 149 mmol/L — ABNORMAL HIGH (ref 135–145)
Total Bilirubin: 3.3 mg/dL — ABNORMAL HIGH (ref 0.3–1.2)
Total Protein: 6.2 g/dL — ABNORMAL LOW (ref 6.5–8.1)

## 2019-04-03 LAB — HEPATITIS B SURFACE ANTIBODY, QUANTITATIVE: Hep B S AB Quant (Post): <3.1 m[IU]/mL — ABNORMAL LOW (ref >9.9)

## 2019-04-03 LAB — MAGNESIUM
Magnesium: 0.5 mg/dL — CL (ref 1.7–2.4)
Magnesium: 1 mg/dL — ABNORMAL LOW (ref 1.7–2.4)
Magnesium: 1.1 mg/dL — ABNORMAL LOW (ref 1.7–2.4)

## 2019-04-03 LAB — ABO/RH: ABO/RH(D): A POS

## 2019-04-03 LAB — HEPATITIS A ANTIBODY, TOTAL: hep A Total Ab: NEGATIVE

## 2019-04-03 LAB — CALCIUM, IONIZED
Calcium, Ionized, Serum: 3.5 mg/dL — ABNORMAL LOW (ref 4.5–5.6)
Calcium, Ionized, Serum: 3.7 mg/dL — ABNORMAL LOW (ref 4.5–5.6)

## 2019-04-03 LAB — POTASSIUM: Potassium: 3.2 mmol/L — ABNORMAL LOW (ref 3.5–5.1)

## 2019-04-03 MED ORDER — POTASSIUM CHLORIDE 10 MEQ/50ML IV SOLN
10.0000 meq | INTRAVENOUS | Status: DC
  Filled 2019-04-03: qty 50

## 2019-04-03 MED ORDER — SODIUM CHLORIDE 0.9% IV SOLUTION: Freq: Once | INTRAVENOUS | Status: DC

## 2019-04-03 MED ORDER — LACTULOSE 10 GM/15ML PO SOLN
20.0000 g | Freq: Two times a day (BID) | ORAL | Status: DC
  Administered 2019-04-03 – 2019-04-05 (×5): 20 g via NASOGASTRIC
  Filled 2019-04-03 (×5): qty 30

## 2019-04-03 MED ORDER — POTASSIUM PHOSPHATES 15 MMOLE/5ML IV SOLN
30.0000 mmol | Freq: Once | INTRAVENOUS | Status: AC
  Administered 2019-04-03: 30 mmol via INTRAVENOUS
  Filled 2019-04-03: qty 10

## 2019-04-03 MED ORDER — MAGNESIUM SULFATE 4 GM/100ML IV SOLN
4.0000 g | Freq: Once | INTRAVENOUS | Status: AC
  Administered 2019-04-03: 4 g via INTRAVENOUS
  Filled 2019-04-03: qty 100

## 2019-04-03 MED ORDER — MAGNESIUM SULFATE 2 GM/50ML IV SOLN
2.0000 g | Freq: Once | INTRAVENOUS | Status: AC
  Administered 2019-04-03: 2 g via INTRAVENOUS
  Filled 2019-04-03: qty 50

## 2019-04-03 MED ORDER — FREE WATER
200.0000 mL | Freq: Four times a day (QID) | Status: DC
  Administered 2019-04-03 – 2019-04-06 (×13): 200 mL

## 2019-04-03 MED ORDER — ALBUTEROL SULFATE (2.5 MG/3ML) 0.083% IN NEBU
10.0000 mg | INHALATION_SOLUTION | Freq: Once | RESPIRATORY_TRACT | Status: AC
  Administered 2019-04-03: 10 mg via RESPIRATORY_TRACT
  Filled 2019-04-03: qty 12

## 2019-04-03 MED ORDER — POTASSIUM PHOSPHATES 15 MMOLE/5ML IV SOLN
30.0000 mmol | Freq: Once | INTRAVENOUS | Status: AC
  Administered 2019-04-03: 23:00:00 30 mmol via INTRAVENOUS
  Filled 2019-04-03: qty 10

## 2019-04-03 MED ORDER — CALCIUM GLUCONATE-NACL 1-0.675 GM/50ML-% IV SOLN
1.0000 g | Freq: Once | INTRAVENOUS | Status: AC
  Administered 2019-04-03: 1000 mg via INTRAVENOUS
  Filled 2019-04-03: qty 50

## 2019-04-03 NOTE — Progress Notes (Signed)
[place] Progress Note Patient Name: Richard Calhoun DOB: 15-Sep-1960 MRN: [id number]   Date of Service  04/03/2019  HPI/Events of Note  Now asynchronous with ventilator.   eICU Interventions  Will order: 1. Increase ceiling on Fentanyl IV infusion to 400 mcg/hour.      Intervention Category Major Interventions: Respiratory failure - evaluation and management  Sommer,Steven Eugene 04/03/2019, 8:53 PM

## 2019-04-03 NOTE — H&P (View-Only) (Signed)
Patient ID: Richard Calhoun, male   DOB: 1961/03/17, 58 y.o.   MRN: [id number]    Progress Note   Subjective   Day # 4 CC; hematemesis/AMS/ETOH abuse/ resp failure  Intubated, sedated, on pressors. No GI bleeding  INR 1.5 hgb 7.7 plts 30 tbili 3.3/AST 151/ALT 82    Objective   Vital signs in last 24 hours: Temp:  [98.6 F (37 C)-100.6 F (38.1 C)] 98.6 F (37 C) (09/15 0400) Pulse Rate:  [64-116] 67 (09/15 0815) Resp:  [20-25] 20 (09/15 0815) BP: (86-141)/(48-77) 101/65 (09/15 0815) SpO2:  [92 %-100 %] 94 % (09/15 0821) FiO2 (%):  [30 %] 30 % (09/15 0821) Last BM Date: 04/03/19 General:    white male, intubated, sedated, moving extremities Heart:  Regular rate and rhythm; no murmurs Lungs: Respirations even and unlabored, lungs CTA  Abdomen:  Soft, large, nontender and nondistended. Normal bowel sounds. Extremities:  Without edema. Neurologic: Sedated  Intake/Output from previous day: 09/14 0701 - 09/15 0700 In: 7162.9 [I.V.:4945.9; NG/GT:800; IV Piggyback:1417.1] Out: 1175 [Urine:1175] Intake/Output this shift: Total I/O In: 638.9 [I.V.:348.9; IV Piggyback:290] Out: -   Lab Results: Recent Labs    04/01/19 0755 04/02/19 0400 04/03/19 0417  WBC 9.4 9.5 6.2  HGB 8.8* 7.9* 7.7*  HCT 28.1* 24.6* 24.7*  PLT 24* 38* 30*   BMET Recent Labs    04/02/19 1933 04/03/19 0018 04/03/19 0417  NA 149* 148* 149*  K 3.5 3.7 3.7  CL 118* 119* 120*  CO2 22 20* 21*  GLUCOSE 151* 157* 166*  BUN 39* 34* 31*  CREATININE 1.27* 1.07 1.14  CALCIUM 6.3* 6.9* 6.9*   LFT Recent Labs    04/03/19 0417  PROT 6.2*  ALBUMIN 2.2*  AST 151*  ALT 82*  ALKPHOS 262*  BILITOT 3.3*   PT/INR Recent Labs    04/02/19 0400 04/03/19 0417  LABPROT 16.4* 17.8*  INR 1.3* 1.5*    Studies/Results: Dg Chest 1 View  Result Date: 04/01/2019 CLINICAL DATA:  Intubation. EXAM: CHEST  1 VIEW COMPARISON:  03/31/2019 FINDINGS: Endotracheal tube has been inserted and is in good  position approximately 6 cm above the carina. Heart size and vascularity are normal. Right lung is clear. New atelectasis at the left lung base. No acute bone abnormality. IMPRESSION: Endotracheal tube in good position. New atelectasis at the left lung base. Electronically Signed   By: Lorriane Shire M.D.   On: 04/01/2019 13:34   Dg Abd 1 View  Result Date: 04/01/2019 CLINICAL DATA:  NG tube placement. EXAM: ABDOMEN - 1 VIEW COMPARISON:  None. FINDINGS: Nasogastric tube is present with tip and side-port over the stomach in the left upper quadrant. Bowel gas pattern is nonobstructive with relative paucity of bowel gas over the mid to upper abdomen. No free peritoneal air. Minimal calcified plaque over the abdominal aorta. Bony structures are normal. IMPRESSION: Nonobstructive bowel gas pattern. Nasogastric tube with tip and side-port over the stomach in the left upper quadrant. Electronically Signed   By: Marin Olp M.D.   On: 04/01/2019 16:00   Dg Chest Port 1 View  Result Date: 04/02/2019 CLINICAL DATA:  Central line placement. EXAM: PORTABLE CHEST 1 VIEW COMPARISON:  04/01/2019 FINDINGS: A new right jugular catheter has been placed and terminates over the lower SVC. An endotracheal tube remains in place and terminates at the clavicular heads. An enteric tube courses into the abdomen with tip not imaged. The cardiomediastinal silhouette is unchanged. The lungs remain mildly hypoinflated with  unchanged asymmetric left basilar opacity. No sizable pleural effusion or pneumothorax is identified. IMPRESSION: 1. Interval right jugular catheter placement as above. 2. Unchanged left basilar lung opacity suggesting atelectasis. Electronically Signed   By: Logan Bores M.D.   On: 04/02/2019 14:34       Assessment / Plan:    #24 58 year old male history of chronic EtOH use admitted with altered mental status, had been complaining of abdominal pain nausea and vomiting and an episode of hematemesis.  No  active bleeding and hemoglobin has remained stable  Continue PPI infusion and empiric octreotide day #3  Suspect underlying alcoholic hepatitis, probable cirrhosis and hepatic encephalopathy.  Had considered EGD today, will reschedule for tomorrow morning.  He has significant thrombocytopenia with platelets in the 30 range today and will need platelet infusion during procedure.  #2 persistent hypotension-requiring pressors #3 coronary artery disease status post stent, on antiplatelet prior to admission #4 metabolic acidosis/acute respiratory failure-Per CCM holding on extubation due to altered mental status and planned EGD #5 abnormal pancreatic tail on CT-we will need MRI of the abdomen when more stable    Active Problems:   Anxiety state   EtOH dependence (HCC)   COLONIC POLYPS, HX OF   Infected sebaceous cyst of skin   Hypertensive heart disease without heart failure   Tachycardia   Chest pain on exertion   S/P drug eluting coronary stent placement   Upper GI bleed   Acute renal failure (HCC)   Alcoholic hepatitis without ascites   Flank pain   Tachypnea   Airway intubation performed without difficulty   Encephalopathy, hepatic ([place])   Encounter for central line placement     LOS: 4 days   [place] Natarajan PA-C 04/03/2019, 8:52 AM

## 2019-04-03 NOTE — Progress Notes (Signed)
[place] Progress Note Patient Name: Richard Calhoun DOB: 1960/08/19 MRN: [id number]   Date of Service  04/03/2019  HPI/Events of Note  Hypokalemia/Hypophosphatemia/Hypomagnesemia - K+ = 3.2, PO4--- = 1.4, Mg++ = 1.1 and Creatinine = 1.0.   eICU Interventions  Will replace K+, PO4--- and Mg++.      Intervention Category Major Interventions: Electrolyte abnormality - evaluation and management  Sommer,Steven Eugene 04/03/2019, 10:24 PM

## 2019-04-03 NOTE — Progress Notes (Signed)
NAME:  Richard Calhoun, MRN:  [id number], DOB:  12/30/1960, LOS: 4 ADMISSION DATE:  03/30/2019, CONSULTATION DATE:  04/01/19 REFERRING MD:  Landis Gandy, MD CHIEF COMPLAINT:  Acute respiratory failure  Brief History   58 year old male with alcohol abuse who presents hematemesis. Has had worsening mental status, tachypnea and oxygen requirement. Placed on BiPAP overnight. Transferred to PCCM for acute respiratory failure  History of present illness   Unable to obtain history due to altered mental status. Chart reviewed and staff spoke to wife for history.  Richard Calhoun is a 58 year old male with hx of alcohol abuse who presented to ED with abdominal pain, nausea and vomiting, and episode of hematemesis. Per his wife, she noticed he had a poor appetite at least for the last two weeks. He drinks 2-3 drinks with her each night. He has been more noticeably drowsy and sleeping continuously for the last five days. He began complaining of left-sided abdominal pain associated with nausea and vomiting for three days that progressively worsened to the point that it was unbearable. The day before he presented he had an episode of hematemesis. Wife does note low level bottle of rubbing alcohol.   Past Medical History  CAD s/p DES, HTN, HLD  Significant Hospital Events   9/11 Admitted on BiPAP  Consults:  PCCM  Procedures:  ETT 9/13> L CVC 9/15 Significant Diagnostic Tests:  CT CAP 9/13 - Mildly enlarged subcarinal and retroperitoneal lymph nodes, RLL scarring, hepatomegaly, abnormal pancreas tail lesion  Micro Data:  9/14 Blood cx  Antimicrobials:    Interim history/subjective:  Afebrile. Fair UOP. Severe hypomag and hypophosphatemia.  Objective   Blood pressure (!) 86/48, pulse 69, temperature 98.6 F (37 C), temperature source Oral, resp. rate (!) 25, height 5\' 10"  (1.778 m), weight 88.9 kg, SpO2 96 %.    Vent Mode: PRVC FiO2 (%):  [30 %] 30 % Set Rate:  [25 bmp] 25 bmp Vt Set:   [430 mL] 430 mL PEEP:  [5 cmH20] 5 cmH20 Plateau Pressure:  [14 cmH20-22 cmH20] 17 cmH20   Intake/Output Summary (Last 24 hours) at 04/03/2019 0745 Last data filed at 04/03/2019 0600 Gross per 24 hour  Intake 7162.91 ml  Output 1175 ml  Net 5987.91 ml   Filed Weights   03/30/19 1800  Weight: 88.9 kg   Physical Exam: General: Chronically ill-appearing, sedated HENT: [place], AT, ETT in place Eyes: EOMI, no scleral icterus Respiratory: Mild rhonchi. No wheezing or rales Cardiovascular: RRR, -M/R/G, no JVD GI: BS+, soft, nontender Extremities:-Edema,-tenderness Neuro: Sedated, purposeful movements with pain, moves extremities x 4 GU: Condom cath in place  Resolved Hospital Problem list   AKI  Assessment & Plan:   Acute respiratory failure: Exacerbated by metabolic acidosis Continue full vent support WUA/SBT as tolerated. Hold on extubation due to mental status and potential EGD PAD protocol: Goal RASS 0 and -1 with fentanyl and precedex VAP  Metabolic acidosis with elevated osmolal gap 2/2 alcoholic/starvation ketoacidosis - Volatile panel + acetone. Acidosis resolved Trend BMP  Continue thiamine  Hypernatremia Trend BMP Continue D5 Increase FWF  Severe electrolyte abnormalities: hypokalemia, hypomag, hypophosphorous Trend BMP, Replete for goal >4 Replete electrolytes  Acute encephalopathy: acidosis, hyperammonemia, electrolyte abnormalities. Drug screen +benzo post-hospitalization, ethanol <10 Increase lactulose for goal stool TID Electrolyte management as above  Hypotension secondary to sedation. Requiring minimal support Wean Neo for goal MAP >65  Anemia, ?acute vs chronic Thrombocytopenia. Present on admission. Unclear if chronic. Last normal  counts in 07/2016. Trend CBC Transfuse for goal Hg >7 and Plt 10  Hematemesis. None since admission Alcoholic hepatitis Elevated INR s/p Vit K Octreotide, PPI and vitamin K per GI. No steroids indicated Appreciate GI  input. Considering EGD Continue thiamine and folate Trend INR F/u hepatitis panel  Abnormal pancreas tail lesion on CT When stable, will need pancreatic CT or abdominal MRI  CAD s/p stent 04/2017 Holding home ASA  Best practice:  Diet: NPO, D5. Will consider TF post-EGD Pain/Anxiety/Delirium protocol (if indicated): Fentanyl gtt, Precedex VAP protocol (if indicated): Yes DVT prophylaxis: Holding in setting of thrombocytopenia GI prophylaxis: PPI Glucose control: CBG q4h Mobility: BR Code Status: Full code Family Communication: Updated "wife" [name] on 9/14 Disposition: ICU  Labs   CBC: Recent Labs  Lab 03/30/19 1600 03/31/19 0202 04/01/19 0755 04/02/19 0400 04/03/19 0417  WBC 8.5 6.9 9.4 9.5 6.2  HGB 8.6* 8.0* 8.8* 7.9* 7.7*  HCT 26.7* 26.3* 28.1* 24.6* 24.7*  MCV 117.6* 122.9* 118.6* 120.6* 121.7*  PLT 34* 27* 24* 38* 30*    Basic Metabolic Panel: Recent Labs  Lab 04/02/19 1000 04/02/19 1200 04/02/19 1933 04/03/19 0018 04/03/19 0417  NA 153* 149* 149* 148* 149*  K 3.4* 3.2* 3.5 3.7 3.7  CL 117* 118* 118* 119* 120*  CO2 22 22 22  20* 21*  GLUCOSE 167* 154* 151* 157* 166*  BUN 46* 42* 39* 34* 31*  CREATININE 1.54* 1.40* 1.27* 1.07 1.14  CALCIUM 6.7* 6.2* 6.3* 6.9* 6.9*  MG  --   --   --   --  0.5*  PHOS  --   --   --   --  <1.0*   GFR: Estimated Creatinine Clearance: 77.4 mL/min (by C-G formula based on SCr of 1.14 mg/dL). Recent Labs  Lab 03/31/19 0202 04/01/19 0755 04/01/19 1130 04/01/19 1412 04/02/19 0400 04/03/19 0417  WBC 6.9 9.4  --   --  9.5 6.2  LATICACIDVEN  --   --  1.3 0.7  --   --     Liver Function Tests: Recent Labs  Lab 03/30/19 1331 03/31/19 0202 04/01/19 1129 04/02/19 1000 04/03/19 0417  AST 190* 187* 200* 177* 151*  ALT 72* 76* 97* 92* 82*  ALKPHOS 315* 311* 319* 280* 262*  BILITOT 3.1* 4.4* 5.4* 3.8* 3.3*  PROT 7.1 7.1 7.5 6.6 6.2*  ALBUMIN 2.9* 2.8* 2.8* 2.5* 2.2*   Recent Labs  Lab 03/30/19 0442  LIPASE 64*    Recent Labs  Lab 04/01/19 1134  AMMONIA 98*    ABG    Component Value Date/Time   PHART 7.249 (L) 04/01/2019 1338   PCO2ART 22.1 (L) 04/01/2019 1338   PO2ART 479 (H) 04/01/2019 1338   HCO3 24.1 04/02/2019 0219   TCO2 20 (L) 04/14/2017 1657   ACIDBASEDEF 0.7 04/02/2019 0219   O2SAT 95.3 04/02/2019 0219     Coagulation Profile: Recent Labs  Lab 03/30/19 1331 03/31/19 0202 04/01/19 0257 04/02/19 0400 04/03/19 0417  INR 1.3* 1.5* 1.5* 1.3* 1.5*    Cardiac Enzymes: No results for input(s): CKTOTAL, CKMB, CKMBINDEX, TROPONINI in the last 168 hours.  HbA1C: Hemoglobin A1C  Date/Time Value Ref Range Status  12/23/2016 12:01 PM 5.6  Final   Hgb A1c MFr Bld  Date/Time Value Ref Range Status  04/15/2017 02:34 AM 5.9 (H) 4.8 - 5.6 % Final    Comment:    (NOTE) Pre diabetes:          5.7%-6.4% Diabetes:              >  6.4% Glycemic control for   <7.0% adults with diabetes   08/29/2012 03:20 PM 6.0 4.6 - 6.5 % Final    Comment:    Glycemic Control Guidelines for People with Diabetes:Non Diabetic:  <6%Goal of Therapy: <7%Additional Action Suggested:  >8%     CBG: Recent Labs  Lab 04/02/19 1145 04/02/19 1547 04/02/19 1952 04/02/19 2329 04/03/19 0357  GLUCAP 166* 143* 141* 128* 146*    Critical care time: 39 min    The patient is critically ill with multiple organ systems failure and requires high complexity decision making for assessment and support, frequent evaluation and titration of therapies, application of advanced monitoring technologies and extensive interpretation of multiple databases.   Critical Care Time devoted to patient care services described in this note is 39 Minutes. This time reflects time of care of this signee Dr. Rodman Pickle. This critical care time does not reflect procedure time, or teaching time or supervisory time of PA/NP/Med student/Med Resident etc but could involve care discussion time.  Rodman Pickle, M.D. [facility]  Pulmonary/Critical Care Medicine 04/03/2019 7:45 AM  Pager: [phone] After hours pager: [phone]

## 2019-04-03 NOTE — Progress Notes (Signed)
Patient ID: Richard Calhoun, male   DOB: July 07, 1961, 58 y.o.   MRN: [id number]    Progress Note   Subjective   Day # 4 CC; hematemesis/AMS/ETOH abuse/ resp failure  Intubated, sedated, on pressors. No GI bleeding  INR 1.5 hgb 7.7 plts 30 tbili 3.3/AST 151/ALT 82    Objective   Vital signs in last 24 hours: Temp:  [98.6 F (37 C)-100.6 F (38.1 C)] 98.6 F (37 C) (09/15 0400) Pulse Rate:  [64-116] 67 (09/15 0815) Resp:  [20-25] 20 (09/15 0815) BP: (86-141)/(48-77) 101/65 (09/15 0815) SpO2:  [92 %-100 %] 94 % (09/15 0821) FiO2 (%):  [30 %] 30 % (09/15 0821) Last BM Date: 04/03/19 General:    white male, intubated, sedated, moving extremities Heart:  Regular rate and rhythm; no murmurs Lungs: Respirations even and unlabored, lungs CTA  Abdomen:  Soft, large, nontender and nondistended. Normal bowel sounds. Extremities:  Without edema. Neurologic: Sedated  Intake/Output from previous day: 09/14 0701 - 09/15 0700 In: 7162.9 [I.V.:4945.9; NG/GT:800; IV Piggyback:1417.1] Out: 1175 [Urine:1175] Intake/Output this shift: Total I/O In: 638.9 [I.V.:348.9; IV Piggyback:290] Out: -   Lab Results: Recent Labs    04/01/19 0755 04/02/19 0400 04/03/19 0417  WBC 9.4 9.5 6.2  HGB 8.8* 7.9* 7.7*  HCT 28.1* 24.6* 24.7*  PLT 24* 38* 30*   BMET Recent Labs    04/02/19 1933 04/03/19 0018 04/03/19 0417  NA 149* 148* 149*  K 3.5 3.7 3.7  CL 118* 119* 120*  CO2 22 20* 21*  GLUCOSE 151* 157* 166*  BUN 39* 34* 31*  CREATININE 1.27* 1.07 1.14  CALCIUM 6.3* 6.9* 6.9*   LFT Recent Labs    04/03/19 0417  PROT 6.2*  ALBUMIN 2.2*  AST 151*  ALT 82*  ALKPHOS 262*  BILITOT 3.3*   PT/INR Recent Labs    04/02/19 0400 04/03/19 0417  LABPROT 16.4* 17.8*  INR 1.3* 1.5*    Studies/Results: Dg Chest 1 View  Result Date: 04/01/2019 CLINICAL DATA:  Intubation. EXAM: CHEST  1 VIEW COMPARISON:  03/31/2019 FINDINGS: Endotracheal tube has been inserted and is in good  position approximately 6 cm above the carina. Heart size and vascularity are normal. Right lung is clear. New atelectasis at the left lung base. No acute bone abnormality. IMPRESSION: Endotracheal tube in good position. New atelectasis at the left lung base. Electronically Signed   By: Lorriane Shire M.D.   On: 04/01/2019 13:34   Dg Abd 1 View  Result Date: 04/01/2019 CLINICAL DATA:  NG tube placement. EXAM: ABDOMEN - 1 VIEW COMPARISON:  None. FINDINGS: Nasogastric tube is present with tip and side-port over the stomach in the left upper quadrant. Bowel gas pattern is nonobstructive with relative paucity of bowel gas over the mid to upper abdomen. No free peritoneal air. Minimal calcified plaque over the abdominal aorta. Bony structures are normal. IMPRESSION: Nonobstructive bowel gas pattern. Nasogastric tube with tip and side-port over the stomach in the left upper quadrant. Electronically Signed   By: Marin Olp M.D.   On: 04/01/2019 16:00   Dg Chest Port 1 View  Result Date: 04/02/2019 CLINICAL DATA:  Central line placement. EXAM: PORTABLE CHEST 1 VIEW COMPARISON:  04/01/2019 FINDINGS: A new right jugular catheter has been placed and terminates over the lower SVC. An endotracheal tube remains in place and terminates at the clavicular heads. An enteric tube courses into the abdomen with tip not imaged. The cardiomediastinal silhouette is unchanged. The lungs remain mildly hypoinflated with  unchanged asymmetric left basilar opacity. No sizable pleural effusion or pneumothorax is identified. IMPRESSION: 1. Interval right jugular catheter placement as above. 2. Unchanged left basilar lung opacity suggesting atelectasis. Electronically Signed   By: Logan Bores M.D.   On: 04/02/2019 14:34       Assessment / Plan:    #47 58 year old male history of chronic EtOH use admitted with altered mental status, had been complaining of abdominal pain nausea and vomiting and an episode of hematemesis.  No  active bleeding and hemoglobin has remained stable  Continue PPI infusion and empiric octreotide day #3  Suspect underlying alcoholic hepatitis, probable cirrhosis and hepatic encephalopathy.  Had considered EGD today, will reschedule for tomorrow morning.  He has significant thrombocytopenia with platelets in the 30 range today and will need platelet infusion during procedure.  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

## 2019-04-03 NOTE — Progress Notes (Signed)
[facility] Progress Note Patient Name: Richard Calhoun DOB: 1960-08-01 MRN: [id number]   Date of Service  04/03/2019  HPI/Events of Note  Agitation   eICU Interventions  Will order: 1. Increase ceiling on Precedex IV infusion to 1.7 mcg/kg/hour.      Intervention Category Major Interventions: Delirium, psychosis, severe agitation - evaluation and management  Sommer,Steven Eugene 04/03/2019, 8:08 PM

## 2019-04-03 NOTE — Progress Notes (Addendum)
[facility] Progress Note Patient Name: Richard Calhoun DOB: April 19, 1961 MRN: [id number]   Date of Service  04/03/2019  HPI/Events of Note  K+ = 3.7, Mg++ = 0.5, PO4--- < 1.0, Ca++ = 6.9 and Creatinine = 3.7.  eICU Interventions  Will order: 1. Replace K+, PO4---, Mg++ and Ca++.     Intervention Category Major Interventions: Electrolyte abnormality - evaluation and management  Sommer,Steven Eugene 04/03/2019, 5:19 AM

## 2019-04-04 ENCOUNTER — Encounter (HOSPITAL_COMMUNITY): Payer: Self-pay | Admitting: Gastroenterology

## 2019-04-04 ENCOUNTER — Inpatient Hospital Stay (HOSPITAL_COMMUNITY): Payer: Self-pay

## 2019-04-04 ENCOUNTER — Encounter (HOSPITAL_COMMUNITY)
Admission: EM | Disposition: A | Discharge status: Home Health | Payer: Self-pay | Source: Home / Self Care | Attending: Internal Medicine

## 2019-04-04 DIAGNOSIS — I8510 Secondary esophageal varices without bleeding: Secondary | ICD-10-CM

## 2019-04-04 DIAGNOSIS — K269 Duodenal ulcer, unspecified as acute or chronic, without hemorrhage or perforation: Secondary | ICD-10-CM

## 2019-04-04 DIAGNOSIS — E878 Other disorders of electrolyte and fluid balance, not elsewhere classified: Secondary | ICD-10-CM

## 2019-04-04 HISTORY — PX: ESOPHAGOGASTRODUODENOSCOPY (EGD) WITH PROPOFOL: SHX5813

## 2019-04-04 LAB — BLOOD CULTURE ID PANEL (REFLEXED)

## 2019-04-04 LAB — PHOSPHORUS
Phosphorus: 4 mg/dL (ref 2.5–4.6)
Phosphorus: 3.7 mg/dL (ref 2.5–4.6)

## 2019-04-04 LAB — COMPREHENSIVE METABOLIC PANEL
ALT: 71 U/L — ABNORMAL HIGH (ref 0–44)
AST: 105 U/L — ABNORMAL HIGH (ref 15–41)
Albumin: 2.2 g/dL — ABNORMAL LOW (ref 3.5–5.0)
Alkaline Phosphatase: 248 U/L — ABNORMAL HIGH (ref 38–126)
Anion gap: 11 (ref 5–15)
BUN: 27 mg/dL — ABNORMAL HIGH (ref 6–20)
CO2: 19 mmol/L — ABNORMAL LOW (ref 22–32)
Calcium: 6.6 mg/dL — ABNORMAL LOW (ref 8.9–10.3)
Chloride: 115 mmol/L — ABNORMAL HIGH (ref 98–111)
Creatinine, Ser: 0.97 mg/dL (ref 0.61–1.24)
GFR calc Af Amer: >60 mL/min (ref >60)
GFR calc non Af Amer: >60 mL/min (ref >60)
Glucose, Bld: 169 mg/dL — ABNORMAL HIGH (ref 70–99)
Potassium: 3.8 mmol/L (ref 3.5–5.1)
Sodium: 145 mmol/L (ref 135–145)
Total Bilirubin: 2.6 mg/dL — ABNORMAL HIGH (ref 0.3–1.2)
Total Protein: 6.2 g/dL — ABNORMAL LOW (ref 6.5–8.1)

## 2019-04-04 LAB — GLUCOSE, CAPILLARY
Glucose-Capillary: 156 mg/dL — ABNORMAL HIGH (ref 70–99)
Glucose-Capillary: 127 mg/dL — ABNORMAL HIGH (ref 70–99)
Glucose-Capillary: 114 mg/dL — ABNORMAL HIGH (ref 70–99)
Glucose-Capillary: 118 mg/dL — ABNORMAL HIGH (ref 70–99)
Glucose-Capillary: 129 mg/dL — ABNORMAL HIGH (ref 70–99)
Glucose-Capillary: 121 mg/dL — ABNORMAL HIGH (ref 70–99)

## 2019-04-04 LAB — PROTIME-INR
INR: 1.5 — ABNORMAL HIGH (ref 0.8–1.2)
Prothrombin Time: 17.9 seconds — ABNORMAL HIGH (ref 11.4–15.2)

## 2019-04-04 LAB — BASIC METABOLIC PANEL
Anion gap: 10 (ref 5–15)
BUN: 30 mg/dL — ABNORMAL HIGH (ref 6–20)
CO2: 18 mmol/L — ABNORMAL LOW (ref 22–32)
Calcium: 6.2 mg/dL — CL (ref 8.9–10.3)
Chloride: 117 mmol/L — ABNORMAL HIGH (ref 98–111)
Creatinine, Ser: 1.16 mg/dL (ref 0.61–1.24)
GFR calc Af Amer: >60 mL/min (ref >60)
GFR calc non Af Amer: >60 mL/min (ref >60)
Glucose, Bld: 156 mg/dL — ABNORMAL HIGH (ref 70–99)
Potassium: 4 mmol/L (ref 3.5–5.1)
Sodium: 145 mmol/L (ref 135–145)

## 2019-04-04 LAB — CBC
HCT: 23.7 % — ABNORMAL LOW (ref 39.0–52.0)
Hemoglobin: 7.4 g/dL — ABNORMAL LOW (ref 13.0–17.0)
MCH: 38.1 pg — ABNORMAL HIGH (ref 26.0–34.0)
MCHC: 31.2 g/dL (ref 30.0–36.0)
MCV: 122.2 fL — ABNORMAL HIGH (ref 80.0–100.0)
Platelets: DECREASED 10*3/uL (ref 150–400)
RBC: 1.94 MIL/uL — ABNORMAL LOW (ref 4.22–5.81)
RDW: 16.3 % — ABNORMAL HIGH (ref 11.5–15.5)
WBC: 6.2 10*3/uL (ref 4.0–10.5)
nRBC: 0.3 % — ABNORMAL HIGH (ref 0.0–0.2)

## 2019-04-04 LAB — MAGNESIUM
Magnesium: 1.4 mg/dL — ABNORMAL LOW (ref 1.7–2.4)
Magnesium: 1.5 mg/dL — ABNORMAL LOW (ref 1.7–2.4)

## 2019-04-04 LAB — POTASSIUM: Potassium: 3.9 mmol/L (ref 3.5–5.1)

## 2019-04-04 SURGERY — ESOPHAGOGASTRODUODENOSCOPY (EGD) WITH PROPOFOL
Anesthesia: Moderate Sedation

## 2019-04-04 MED ORDER — MAGNESIUM SULFATE 2 GM/50ML IV SOLN
2.0000 g | Freq: Once | INTRAVENOUS | Status: AC
  Administered 2019-04-04: 10:00:00 2 g via INTRAVENOUS
  Filled 2019-04-04: qty 50

## 2019-04-04 MED ORDER — MIDAZOLAM HCL 2 MG/2ML IJ SOLN
INTRAMUSCULAR | Status: AC
  Administered 2019-04-04: 14:00:00 2 mg
  Filled 2019-04-04: qty 2

## 2019-04-04 MED ORDER — SODIUM CHLORIDE 0.9 % IV SOLN
2.0000 g | INTRAVENOUS | Status: AC
  Administered 2019-04-04 – 2019-04-08 (×5): 2 g via INTRAVENOUS
  Filled 2019-04-04 (×5): qty 2

## 2019-04-04 MED ORDER — VANCOMYCIN HCL 10 G IV SOLR
2000.0000 mg | Freq: Once | INTRAVENOUS | Status: AC
  Administered 2019-04-04: 2000 mg via INTRAVENOUS
  Filled 2019-04-04: qty 2000

## 2019-04-04 MED ORDER — QUETIAPINE FUMARATE 50 MG PO TABS
25.0000 mg | ORAL_TABLET | Freq: Every day | ORAL | Status: DC
  Administered 2019-04-04: 21:00:00 25 mg via ORAL
  Filled 2019-04-04: qty 1

## 2019-04-04 MED ORDER — MIDAZOLAM HCL 2 MG/2ML IJ SOLN
INTRAMUSCULAR | Status: AC
  Administered 2019-04-04: 09:00:00 2 mg
  Filled 2019-04-04: qty 2

## 2019-04-04 MED ORDER — CALCIUM GLUCONATE-NACL 2-0.675 GM/100ML-% IV SOLN
2.0000 g | Freq: Once | INTRAVENOUS | Status: AC
  Administered 2019-04-04: 19:00:00 2000 mg via INTRAVENOUS
  Filled 2019-04-04: qty 100

## 2019-04-04 MED ORDER — VANCOMYCIN HCL IN DEXTROSE 1-5 GM/200ML-% IV SOLN
1000.0000 mg | Freq: Two times a day (BID) | INTRAVENOUS | Status: DC
  Administered 2019-04-05: 06:00:00 1000 mg via INTRAVENOUS
  Filled 2019-04-04: qty 200

## 2019-04-04 MED ORDER — MIDAZOLAM HCL 2 MG/2ML IJ SOLN
INTRAMUSCULAR | Status: AC
  Filled 2019-04-04: qty 2

## 2019-04-04 MED ORDER — DEXMEDETOMIDINE HCL IN NACL 400 MCG/100ML IV SOLN
0.4000 ug/kg/h | INTRAVENOUS | Status: DC
  Administered 2019-04-04: 15:00:00 1.2 ug/kg/h via INTRAVENOUS
  Administered 2019-04-04: 18:00:00 1.7 ug/kg/h via INTRAVENOUS
  Administered 2019-04-05 (×3): 0.7 ug/kg/h via INTRAVENOUS
  Administered 2019-04-06: 09:00:00 0.4 ug/kg/h via INTRAVENOUS
  Filled 2019-04-04: qty 100
  Filled 2019-04-04: qty 200
  Filled 2019-04-04 (×6): qty 100
  Filled 2019-04-04: qty 200

## 2019-04-04 SURGICAL SUPPLY — 15 items

## 2019-04-04 NOTE — Interval H&P Note (Signed)
History and Physical Interval Note:  04/04/2019 8:16 AM  Richard Calhoun  has presented today for surgery, with the diagnosis of GI bleed.  The various methods of treatment have been discussed with the patient and family. After consideration of risks, benefits and other options for treatment, the patient has consented to  Procedure(s) with comments: ESOPHAGOGASTRODUODENOSCOPY (EGD) WITH PROPOFOL (N/A) - bedside-on vent as a surgical intervention.  The patient's history has been reviewed, patient examined, no change in status, stable for surgery.  I have reviewed the patient's chart and labs.  Questions were answered to the patient's satisfaction.     [employer]

## 2019-04-04 NOTE — Progress Notes (Signed)
[facility] Progress Note Patient Name: Richard Calhoun DOB: [birth year] MRN: [id number]   Date of Service  04/04/2019  HPI/Events of Note  Agitation - Request to renew restraint orders.   eICU Interventions  Will order: 1. Will renew restraint orders x 10 hours.  2. Please be sure to have the rounding team evaluate the patient at bedside and renew the restraint orders in AM     Intervention Category Major Interventions: Delirium, psychosis, severe agitation - evaluation and management  Allycia Pitz Eugene 04/04/2019, 10:10 PM

## 2019-04-04 NOTE — TOC Initial Note (Signed)
Transition of Care [facility]) - Initial/Assessment Note    Patient Details  Name: Richard Calhoun MRN: [id number] Date of Birth: 11/04/1960  [facility]) CM/SW Contact:    Teaghan Melrose, Marjie Skiff, RN Phone Number:[phone] 04/04/2019, 10:30 AM  Clinical Narrative:                 Pt is active with the [facility] for PCP. TOC will continue to follow along.  Expected Discharge Plan: Home/Self Care Barriers to Discharge: Continued Medical Work up   Patient Goals and CMS Choice        Expected Discharge Plan and Services Expected Discharge Plan: Home/Self Care   Discharge Planning Services: CM Consult   Living arrangements for the past 2 months: Single Family Home Expected Discharge Date: (unknown)                                    Prior Living Arrangements/Services Living arrangements for the past 2 months: Single Family Home Lives with:: Significant Other Patient language and need for interpreter reviewed:: Yes                 Activities of Daily Living Home Assistive Devices/Equipment: None ADL Screening (condition at time of admission) Patient's cognitive ability adequate to safely complete daily activities?: Yes Is the patient deaf or have difficulty hearing?: No Does the patient have difficulty seeing, even when wearing glasses/contacts?: No Does the patient have difficulty concentrating, remembering, or making decisions?: Yes Patient able to express need for assistance with ADLs?: Yes Does the patient have difficulty dressing or bathing?: No Independently performs ADLs?: Yes (appropriate for developmental age) Does the patient have difficulty walking or climbing stairs?: Yes Weakness of Legs: Both Weakness of Arms/Hands: None  Permission Sought/Granted                  Emotional Assessment         Alcohol / Substance Use: Alcohol Use Psych Involvement: No (comment)  Admission diagnosis:  Flank pain [R10.9] Acute renal failure,  unspecified acute renal failure type [facility]) [N17.9] Patient Active Problem List   Diagnosis Date Noted  . Encounter for central line placement   . Flank pain   . Tachypnea   . Airway intubation performed without difficulty   . Encephalopathy, hepatic ([place])   . Acute renal failure ([place])   . Alcoholic hepatitis without ascites   . Upper GI bleed 03/30/2019  . Decreased pulses in feet 11/16/2018  . Pain in both lower extremities 11/16/2018  . Localized swelling of both lower legs 11/16/2018  . S/P drug eluting coronary stent placement 07/20/2017  . Tinnitus of left ear 07/08/2017  . Hand edema 04/19/2017  . Hypophosphatemia 04/19/2017  . Coronary artery disease involving native coronary artery of native heart without angina pectoris   . Acute hyperactive alcohol withdrawal delirium ([place])   . Chest pain on exertion 04/15/2017  . Chest pain 04/14/2017  . Hypokalemia 04/14/2017  . Tobacco abuse 04/14/2017  . Hypertensive heart disease without heart failure 03/25/2017  . Tachycardia 03/25/2017  . Infected sebaceous cyst of skin 10/23/2012  . Diarrhea 08/29/2012  . Impaired glucose tolerance 05/18/2011  . Preventative health care 05/16/2011  . BICEPS TENDON RUPTURE, RIGHT 06/01/2010  . Anxiety state 10/30/2009  . ANGIOEDEMA 04/24/2009  . Hyperlipidemia LDL goal <70 02/21/2007  . EtOH dependence ([place]) 02/21/2007  . DEPRESSION 02/21/2007  . Uncontrolled hypertension 02/21/2007  .  HEMORRHOIDS 02/21/2007  . ALLERGIC RHINITIS 02/21/2007  . GERD 02/21/2007  . OSTEOARTHRITIS 02/21/2007  . LOW BACK PAIN 02/21/2007  . Headache(784.0) 02/21/2007  . COLONIC POLYPS, HX OF 02/21/2007   PCP:  Charlott Rakes, MD Pharmacy:   [facility] [place] [address] [place] [address] Phone: [phone] Fax: [phone]     Social Determinants of Health (SDOH) Interventions    Readmission Risk Interventions Readmission Risk Prevention  Plan 04/04/2019  Transportation Screening Complete  PCP or Specialist Appt within 3-5 Days Not Complete  Not Complete comments Not ready for dc  HRI or [place] Not Complete  HRI or Home Care Consult comments NA  Social Work Consult for [place] Planning/Counseling Not Complete  SW consult not completed comments Pt confused  Palliative Care Screening Not Applicable  Medication Review [employer]) Complete  Some recent data might be hidden

## 2019-04-04 NOTE — Progress Notes (Signed)
Wasted 2mg  of versed with Polly Cobia, RN in stericycle. Override Pulled for EGD at bedside, never used.

## 2019-04-04 NOTE — Op Note (Signed)
[facility] Patient Name: Richard Calhoun Procedure Date: 04/04/2019 MRN: [id number] Attending MD: Justice Britain , MD Date of Birth: [birth year] CSN: [id number] Age: 58 Admit Type: Inpatient Procedure:                Upper GI endoscopy Indications:              Hematemesis, Portal hypertension with UGI bleeding                            and suspected esophageal varices Providers:                Justice Britain, MD, Cleda Daub, RN, Marguerita Merles, Technician Referring MD:             Carlota Raspberry. Havery Moros, MD, Dr. Loanne Drilling Medicines:                Monitored Anesthesia Care, Anesthesia per ICU                            (administered an addition 2 mg IV Versed in                            addition to his Precedex and other sedation) Complications:            No immediate complications. Estimated Blood Loss:     Estimated blood loss was minimal. Estimated blood                            loss: none. Procedure:                Pre-Anesthesia Assessment:                           - Prior to the procedure, a History and Physical                            was performed, and patient medications and                            allergies were reviewed. The patient's tolerance of                            previous anesthesia was also reviewed. The risks                            and benefits of the procedure and the sedation                            options and risks were discussed with the patient.                            All questions were answered, and informed consent  was obtained. Prior Anticoagulants: The patient has                            taken no previous anticoagulant or antiplatelet                            agents. ASA Grade Assessment: IV - A patient with                            severe systemic disease that is a constant threat                            to life. After reviewing the risks and  benefits,                            the patient was deemed in satisfactory condition to                            undergo the procedure.                           After obtaining informed consent, the endoscope was                            passed under direct vision. Throughout the                            procedure, the patient's blood pressure, pulse, and                            oxygen saturations were monitored continuously. The                            [id number] ([id number]) Olympus therapeutic endoscope                            was introduced through the mouth, and advanced to                            the second part of duodenum. The upper GI endoscopy                            was accomplished without difficulty. The patient                            tolerated the procedure. Scope In: Scope Out: Findings:      No gross lesions were noted in the proximal esophagus and in the mid       esophagus.      Grade I varices were found in the distal esophagus. No evidence of red       wale signs.      Severe, diffuse portal hypertensive gastropathy was found in the entire       examined stomach.      A few dispersed, small non-bleeding  erosions were found in the gastric       antrum. There were no stigmata of recent bleeding.      A few dispersed erosions without bleeding were found in the duodenal       bulb, in the first portion of the duodenum and in the second portion of       the duodenum.      A 16 Fr orogastric tube was placed through the nares into the esophagus.       Under endoscopic guidance, the tube was advanced into the stomach.       Placement was confirmed by scope visualization. Impression:               - No gross lesions in esophagus proximally. Grade I                            esophageal varices distally.                           - Portal hypertensive gastropathy. Non-bleeding                            erosive gastropathy.                            - Duodenal erosions without bleeding.                           - Feeding tube placement was successfully performed.                           - No specimens collected due to chronic                            thrombocytopenia. Moderate Sedation:      Not Applicable - Patient had care per Anesthesia. Recommendation:           - The patient will be observed post-procedure,                            until all discharge criteria are met.                           - Return patient to ICU for ongoing care.                           - May stop Octreotide drip.                           - Transition to IV PPI BID until able to take oral                            medications.                           - Send H. pylori blood serology and will treat if  returns positive.                           - Continue Antibiotics for Infection prophylaxis                            for at least 5-days (total since admission) but                            defer further antibiotics or management to primary                            ICU service.                           - If able to proceed with MRI-Abdomen/MRICP to                            further elucidate the Pancreatic lesion/mass in                            tail while intubated that would be OK but if not                            felt safe to proceed then can proceed at time of                            patient stability.                           - The findings and recommendations were discussed                            with the referring physician. Procedure Code(s):        --- Professional ---                           [phone], Esophagogastroduodenoscopy, flexible,                            transoral; with insertion of intraluminal tube or                            catheter Diagnosis Code(s):        --- Professional ---                           K76.6, Portal hypertension                           I85.10,  Secondary esophageal varices without                            bleeding                           K31.89, Other diseases of  stomach and duodenum                           K26.9, Duodenal ulcer, unspecified as acute or                            chronic, without hemorrhage or perforation                           K92.0, Hematemesis                           K92.2, Gastrointestinal hemorrhage, unspecified CPT copyright 2019 American Medical Association. All rights reserved. The codes documented in this report are preliminary and upon coder review may  be revised to meet current compliance requirements. Justice Britain, MD 04/04/2019 9:51:45 AM Number of Addenda: 0

## 2019-04-04 NOTE — Progress Notes (Signed)
CRITICAL VALUE ALERT  Critical Value:  Ca 6.2  Date & Time Notied:  04/04/2019, 1743  Provider Notified: Loanne Drilling MD  Orders Received/Actions taken: 2g Calcium gluconate

## 2019-04-04 NOTE — Progress Notes (Signed)
NAME:  Richard Calhoun, MRN:  [id number], DOB:  Jul 19, 1961, LOS: 5 ADMISSION DATE:  03/30/2019, CONSULTATION DATE:  04/01/19 REFERRING MD:  Landis Gandy, MD CHIEF COMPLAINT:  Acute respiratory failure  Brief History   58 year old male with alcohol abuse who presents hematemesis. Has had worsening mental status, tachypnea and oxygen requirement. Placed on BiPAP overnight. Transferred to PCCM for acute respiratory failure  History of present illness   Unable to obtain history due to altered mental status. Chart reviewed and staff spoke to wife for history.  Richard Calhoun is a 57 year old male with hx of alcohol abuse who presented to ED with abdominal pain, nausea and vomiting, and episode of hematemesis. Per his wife, she noticed he had a poor appetite at least for the last two weeks. He drinks 2-3 drinks with her each night. He has been more noticeably drowsy and sleeping continuously for the last five days. He began complaining of left-sided abdominal pain associated with nausea and vomiting for three days that progressively worsened to the point that it was unbearable. The day before he presented he had an episode of hematemesis. Wife does note low level bottle of rubbing alcohol.   Past Medical History  CAD s/p DES, HTN, HLD  Significant Hospital Events   9/11 Admitted on BiPAP 9/13 ICU consulted and patient intubated for acute toxic-metabolic encephalopathy [id number] GI planning for EGD  Consults:  PCCM  Procedures:  ETT 9/13> L CVC 9/14> Significant Diagnostic Tests:  CT CAP 9/13 - Mildly enlarged subcarinal and retroperitoneal lymph nodes, RLL scarring, hepatomegaly, abnormal pancreas tail lesion  Micro Data:  9/14 Blood cx  Antimicrobials:    Interim history/subjective:  On minimal sedation. Fair UOP  Objective   Blood pressure (!) 100/58, pulse 72, temperature 97.6 F (36.4 C), temperature source Axillary, resp. rate 14, height 5\' 10"  (1.778 m), weight 88.9 kg, SpO2  100 %.    Vent Mode: PRVC FiO2 (%):  [30 %-40 %] 30 % Set Rate:  [25 bmp] 25 bmp Vt Set:  [430 mL] 430 mL PEEP:  [5 cmH20] 5 cmH20 Plateau Pressure:  [16 cmH20-19 cmH20] 18 cmH20   Intake/Output Summary (Last 24 hours) at 04/04/2019 0733 Last data filed at 04/04/2019 0400 Gross per 24 hour  Intake 5019.9 ml  Output 925 ml  Net 4094.9 ml   Filed Weights   03/30/19 1800  Weight: 88.9 kg   Physical Exam: General: Chronically ill-appearing, sedated HENT: [place], AT, ETT in place Eyes: EOMI, no scleral icterus Respiratory: Mild rhonchi. No wheezing or rales Cardiovascular: RRR, -M/R/G, no JVD GI: BS+, soft, nontender Extremities:-Edema,-tenderness Neuro: Sedated, purposeful movements with pain, moves extremities x 4 GU: Condom cath in place  Resolved Hospital Problem list   Metabolic acidosis with elevated osmolal gap 2/2 alcoholic/starvation ketoacidosis - Volatile panel + acetone. AKI  Assessment & Plan:   Acute respiratory failure: metabolic acidosis, airway protection Continue full vent support Wean as tolerated however no plans for extubation due to procedure and mental status PAD protocol: Goal RASS 0 and -1 with fentanyl and precedex VAP  Hypernatremia Trend BMP FWF  Severe electrolyte abnormalities: hypokalemia, hypomag, hypophosphorous Trend BMP Replete for goal K>4, Mg >2  Acute encephalopathy: acidosis, hyperammonemia, electrolyte abnormalities. Drug screen +benzo post-hospitalization, ethanol <10 Increase lactulose for goal stool TID Electrolyte management as above  Hypotension secondary to sedation. Requiring minimal support Wean Neo for goal MAP >65  Anemia, ?acute vs chronic Thrombocytopenia. Present on admission. Unclear if  chronic. Last normal counts in 07/2016. Trend CBC Transfuse for goal Hg >7 and Plt 10 EGD planned today. Will receive platelet transfusion prior to procedure  Hematemesis. None since admission Alcoholic hepatitis. Viral  hepatitis panel neg Severe hepatic steatosis Elevated INR s/p Vit K Octreotide and PPI per GI. No steroids indicated EGD planned today Continue thiamine and folate Trend INR  Abnormal pancreas tail lesion on CT When stable, will need pancreatic CT or abdominal MRI  CAD s/p stent 04/2017 Holding home ASA  Best practice:  Diet: NPO. Will consider TF post-EGD Pain/Anxiety/Delirium protocol (if indicated): Fentanyl gtt, Precedex VAP protocol (if indicated): Yes DVT prophylaxis: Holding in setting of thrombocytopenia GI prophylaxis: PPI Glucose control: CBG q4h Mobility: BR Code Status: Full code Family Communication: Updated "wife" [name] on 9/16 Disposition: ICU  Labs   CBC: Recent Labs  Lab 04/01/19 0755 04/02/19 0400 04/03/19 0417 04/03/19 1240 04/04/19 0440  WBC 9.4 9.5 6.2 5.6 6.2  HGB 8.8* 7.9* 7.7* 7.5* 7.4*  HCT 28.1* 24.6* 24.7* 23.8* 23.7*  MCV 118.6* 120.6* 121.7* 121.4* 122.2*  PLT 24* 38* 30* 36* PLATELET CLUMPS NOTED ON SMEAR, COUNT APPEARS DECREASED    Basic Metabolic Panel: Recent Labs  Lab 04/03/19 0018 04/03/19 0417 04/03/19 0818 04/03/19 1240 04/03/19 1710 04/03/19 1810 04/03/19 2112  NA 148* 149* 146* 143  --  148*  --   K 3.7 3.7 6.1* 6.1*  --  3.4* 3.2*  CL 119* 120* 114* 115*  --  119*  --   CO2 20* 21* 19* 21*  --  20*  --   GLUCOSE 157* 166* 305* 324*  --  159*  --   BUN 34* 31* 30* 30*  --  29*  --   CREATININE 1.07 1.14 1.01 0.96  --  1.00  --   CALCIUM 6.9* 6.9* 7.0* 6.7*  --  6.8*  --   MG  --  0.5*  --   --  1.0* 1.1*  --   PHOS  --  <1.0*  --   --  1.4* 1.4*  --    GFR: Estimated Creatinine Clearance: 88.2 mL/min (by C-G formula based on SCr of 1 mg/dL). Recent Labs  Lab 04/01/19 1130 04/01/19 1412 04/02/19 0400 04/03/19 0417 04/03/19 1240 04/04/19 0440  WBC  --   --  9.5 6.2 5.6 6.2  LATICACIDVEN 1.3 0.7  --   --   --   --     Liver Function Tests: Recent Labs  Lab 03/30/19 1331 03/31/19 0202 04/01/19 1129  04/02/19 1000 04/03/19 0417  AST 190* 187* 200* 177* 151*  ALT 72* 76* 97* 92* 82*  ALKPHOS 315* 311* 319* 280* 262*  BILITOT 3.1* 4.4* 5.4* 3.8* 3.3*  PROT 7.1 7.1 7.5 6.6 6.2*  ALBUMIN 2.9* 2.8* 2.8* 2.5* 2.2*   Recent Labs  Lab 03/30/19 0442  LIPASE 64*   Recent Labs  Lab 04/01/19 1134  AMMONIA 98*    ABG    Component Value Date/Time   PHART 7.249 (L) 04/01/2019 1338   PCO2ART 22.1 (L) 04/01/2019 1338   PO2ART 479 (H) 04/01/2019 1338   HCO3 24.1 04/02/2019 0219   TCO2 20 (L) 04/14/2017 1657   ACIDBASEDEF 0.7 04/02/2019 0219   O2SAT 95.3 04/02/2019 0219     Coagulation Profile: Recent Labs  Lab 03/31/19 0202 04/01/19 0257 04/02/19 0400 04/03/19 0417 04/04/19 0440  INR 1.5* 1.5* 1.3* 1.5* 1.5*    Cardiac Enzymes: No results for input(s): CKTOTAL, CKMB, CKMBINDEX,  TROPONINI in the last 168 hours.  HbA1C: Hemoglobin A1C  Date/Time Value Ref Range Status  12/23/2016 12:01 PM 5.6  Final   Hgb A1c MFr Bld  Date/Time Value Ref Range Status  04/15/2017 02:34 AM 5.9 (H) 4.8 - 5.6 % Final    Comment:    (NOTE) Pre diabetes:          5.7%-6.4% Diabetes:              >6.4% Glycemic control for   <7.0% adults with diabetes   08/29/2012 03:20 PM 6.0 4.6 - 6.5 % Final    Comment:    Glycemic Control Guidelines for People with Diabetes:Non Diabetic:  <6%Goal of Therapy: <7%Additional Action Suggested:  >8%     CBG: Recent Labs  Lab 04/03/19 1519 04/03/19 1953 04/03/19 2323 04/04/19 0314 04/04/19 0726  GLUCAP 154* 142* 146* 156* 127*    Critical care time: 36min    The patient is critically ill with multiple organ systems failure and requires high complexity decision making for assessment and support, frequent evaluation and titration of therapies, application of advanced monitoring technologies and extensive interpretation of multiple databases.   Critical Care Time devoted to patient care services described in this note is 40 Minutes. This time  reflects time of care of this signee Dr. Rodman Pickle. This critical care time does not reflect procedure time, or teaching time or supervisory time of PA/NP/Med student/Med Resident etc but could involve care discussion time.  Rodman Pickle, M.D. [facility] Pulmonary/Critical Care Medicine 04/04/2019 7:33 AM  Pager: [phone] After hours pager: [phone]

## 2019-04-04 NOTE — Progress Notes (Signed)
Noticed swelling around patients central line, right IJ. About the size of a small baseball. Stopped fluids and sedation running through it immediately. Called MD to come look at it. Swelling now down, blood pulled back from IJ. Will continue to monitor patient closely.

## 2019-04-04 NOTE — Progress Notes (Addendum)
Pharmacy Antibiotic Note  Richard Calhoun is a 58 y.o. male admitted on 03/30/2019 with UGIB and metabolic acidosis.  Now with low grade fever & 1/4 blood cx samples +GPCC.  Pharmacy has been consulted for Vancomycin dosing for bacteremia.  04/04/2019:  Tm 100.77F  No leukocytosis  Scr at baseline- Scr 0.97  Plan: Vancomycin 2gm IV x1 now then 1gm IV q12h (goal AUC 400-550) Rocephin per MD Monitor renal function and cx data   Height: 5\' 10"  (177.8 cm) Weight: 195 lb 15.8 oz (88.9 kg) IBW/kg (Calculated) : 73  Temp (24hrs), Avg:98.4 F (36.9 C), Min:97.6 F (36.4 C), Max:100.4 F (38 C)  Recent Labs  Lab 04/01/19 0755  04/01/19 1130 04/01/19 1412  04/02/19 0400  04/03/19 0417 04/03/19 0818 04/03/19 1240 04/03/19 1810 04/04/19 0440  WBC 9.4  --   --   --   --  9.5  --  6.2  --  5.6  --  6.2  CREATININE 1.78*   < >  --   --    < >  --    < > 1.14 1.01 0.96 1.00 0.97  LATICACIDVEN  --   --  1.3 0.7  --   --   --   --   --   --   --   --    < > = values in this interval not displayed.    Estimated Creatinine Clearance: 91 mL/min (by C-G formula based on SCr of 0.97 mg/dL).    Allergies  Allergen Reactions  . Penicillins Other (See Comments)    Childhood reaction Has patient had a PCN reaction causing immediate rash, facial/tongue/throat swelling, SOB or lightheadedness with hypotension: Unknown Has patient had a PCN reaction causing severe rash involving mucus membranes or skin necrosis: Unknown Has patient had a PCN reaction that required hospitalization: No Has patient had a PCN reaction occurring within the last 10 years: No If all of the above answers are "NO", then may proceed with Cephalosporin use.     Antimicrobials this admission: 9/16 Rocephin >>  9/16 Vanc >>   Dose adjustments this admission:  Microbiology results: 9/14 BCx: 1/4 GPCC 9/11 COVID: negative  Thank you for allowing pharmacy to be a part of this patient's care.  Netta Cedars,  PharmD, BCPS 04/04/2019 5:22 PM   Addendum: Received BCID call from lab- nothing detected on BCID.  Dr Arnoldo Lenis already aware of Bcx +GPC and Vancomycin added, repeat blood cx already sent.  Netta Cedars, PharmD, BCPS 04/04/2019@6 :29 PM

## 2019-04-05 ENCOUNTER — Inpatient Hospital Stay (HOSPITAL_COMMUNITY): Payer: Self-pay

## 2019-04-05 DIAGNOSIS — K766 Portal hypertension: Secondary | ICD-10-CM

## 2019-04-05 DIAGNOSIS — Z789 Other specified health status: Secondary | ICD-10-CM

## 2019-04-05 DIAGNOSIS — K3189 Other diseases of stomach and duodenum: Secondary | ICD-10-CM

## 2019-04-05 DIAGNOSIS — Z9911 Dependence on respirator [ventilator] status: Secondary | ICD-10-CM

## 2019-04-05 LAB — COMPREHENSIVE METABOLIC PANEL
ALT: 56 U/L — ABNORMAL HIGH (ref 0–44)
AST: 75 U/L — ABNORMAL HIGH (ref 15–41)
Albumin: 2 g/dL — ABNORMAL LOW (ref 3.5–5.0)
Alkaline Phosphatase: 216 U/L — ABNORMAL HIGH (ref 38–126)
Anion gap: 14 (ref 5–15)
BUN: 29 mg/dL — ABNORMAL HIGH (ref 6–20)
CO2: 15 mmol/L — ABNORMAL LOW (ref 22–32)
Calcium: 6.4 mg/dL — CL (ref 8.9–10.3)
Chloride: 116 mmol/L — ABNORMAL HIGH (ref 98–111)
Creatinine, Ser: 1.23 mg/dL (ref 0.61–1.24)
GFR calc Af Amer: >60 mL/min (ref >60)
GFR calc non Af Amer: >60 mL/min (ref >60)
Glucose, Bld: 137 mg/dL — ABNORMAL HIGH (ref 70–99)
Potassium: 3.7 mmol/L (ref 3.5–5.1)
Sodium: 145 mmol/L (ref 135–145)
Total Bilirubin: 2.3 mg/dL — ABNORMAL HIGH (ref 0.3–1.2)
Total Protein: 5.7 g/dL — ABNORMAL LOW (ref 6.5–8.1)

## 2019-04-05 LAB — PROTIME-INR
INR: 1.6 — ABNORMAL HIGH (ref 0.8–1.2)
Prothrombin Time: 18.8 seconds — ABNORMAL HIGH (ref 11.4–15.2)

## 2019-04-05 LAB — BASIC METABOLIC PANEL
Anion gap: 9 (ref 5–15)
BUN: 31 mg/dL — ABNORMAL HIGH (ref 6–20)
CO2: 16 mmol/L — ABNORMAL LOW (ref 22–32)
Calcium: 6.7 mg/dL — ABNORMAL LOW (ref 8.9–10.3)
Chloride: 121 mmol/L — ABNORMAL HIGH (ref 98–111)
Creatinine, Ser: 1.19 mg/dL (ref 0.61–1.24)
GFR calc Af Amer: >60 mL/min (ref >60)
GFR calc non Af Amer: >60 mL/min (ref >60)
Glucose, Bld: 137 mg/dL — ABNORMAL HIGH (ref 70–99)
Potassium: 3.8 mmol/L (ref 3.5–5.1)
Sodium: 146 mmol/L — ABNORMAL HIGH (ref 135–145)

## 2019-04-05 LAB — CULTURE, BLOOD (ROUTINE X 2): Special Requests: ADEQUATE

## 2019-04-05 LAB — PREPARE PLATELET PHERESIS: Unit division: 0

## 2019-04-05 LAB — CBC
HCT: 22.9 % — ABNORMAL LOW (ref 39.0–52.0)
Hemoglobin: 7 g/dL — ABNORMAL LOW (ref 13.0–17.0)
MCH: 37.8 pg — ABNORMAL HIGH (ref 26.0–34.0)
MCHC: 30.6 g/dL (ref 30.0–36.0)
MCV: 123.8 fL — ABNORMAL HIGH (ref 80.0–100.0)
Platelets: 55 10*3/uL — ABNORMAL LOW (ref 150–400)
RBC: 1.85 MIL/uL — ABNORMAL LOW (ref 4.22–5.81)
RDW: 16.4 % — ABNORMAL HIGH (ref 11.5–15.5)
WBC: 5 10*3/uL (ref 4.0–10.5)
nRBC: 0.4 % — ABNORMAL HIGH (ref 0.0–0.2)

## 2019-04-05 LAB — GLUCOSE, CAPILLARY
Glucose-Capillary: 120 mg/dL — ABNORMAL HIGH (ref 70–99)
Glucose-Capillary: 144 mg/dL — ABNORMAL HIGH (ref 70–99)
Glucose-Capillary: 127 mg/dL — ABNORMAL HIGH (ref 70–99)
Glucose-Capillary: 120 mg/dL — ABNORMAL HIGH (ref 70–99)
Glucose-Capillary: 109 mg/dL — ABNORMAL HIGH (ref 70–99)

## 2019-04-05 LAB — MAGNESIUM
Magnesium: 1.5 mg/dL — ABNORMAL LOW (ref 1.7–2.4)
Magnesium: 1.9 mg/dL (ref 1.7–2.4)

## 2019-04-05 LAB — BPAM PLATELET PHERESIS
Blood Product Expiration Date: 202009162359
ISSUE DATE / TIME: 202009160812
Unit Type and Rh: 6200

## 2019-04-05 LAB — PHOSPHORUS
Phosphorus: 3.7 mg/dL (ref 2.5–4.6)
Phosphorus: 4.1 mg/dL (ref 2.5–4.6)

## 2019-04-05 MED ORDER — LACTULOSE ENEMA
300.0000 mL | Freq: Once | ORAL | Status: AC
  Administered 2019-04-05: 300 mL via RECTAL
  Filled 2019-04-05: qty 300

## 2019-04-05 MED ORDER — SODIUM CHLORIDE 0.9% FLUSH
10.0000 mL | Freq: Two times a day (BID) | INTRAVENOUS | Status: DC
  Administered 2019-04-05 – 2019-04-08 (×8): 10 mL

## 2019-04-05 MED ORDER — FENTANYL BOLUS VIA INFUSION
50.0000 ug | INTRAVENOUS | Status: DC | PRN
  Administered 2019-04-05 (×3): 100 ug via INTRAVENOUS
  Administered 2019-04-05: 15:00:00 50 ug via INTRAVENOUS
  Administered 2019-04-06: 05:00:00 100 ug via INTRAVENOUS
  Administered 2019-04-06: 50 ug via INTRAVENOUS
  Administered 2019-04-06 (×2): 100 ug via INTRAVENOUS
  Administered 2019-04-06: 50 ug via INTRAVENOUS
  Administered 2019-04-06: 100 ug via INTRAVENOUS
  Filled 2019-04-05: qty 100

## 2019-04-05 MED ORDER — MAGNESIUM SULFATE 2 GM/50ML IV SOLN
2.0000 g | Freq: Once | INTRAVENOUS | Status: AC
  Administered 2019-04-05: 06:00:00 2 g via INTRAVENOUS
  Filled 2019-04-05: qty 50

## 2019-04-05 MED ORDER — MIDAZOLAM HCL 2 MG/2ML IJ SOLN
2.0000 mg | INTRAMUSCULAR | Status: AC | PRN
  Administered 2019-04-05 – 2019-04-06 (×3): 2 mg via INTRAVENOUS
  Filled 2019-04-05 (×3): qty 2

## 2019-04-05 MED ORDER — SODIUM CHLORIDE 0.9% FLUSH: 10.0000 mL | INTRAVENOUS | Status: DC | PRN

## 2019-04-05 MED ORDER — LACTULOSE 10 GM/15ML PO SOLN
20.0000 g | Freq: Three times a day (TID) | ORAL | Status: DC
  Administered 2019-04-05 – 2019-04-06 (×3): 20 g via NASOGASTRIC
  Filled 2019-04-05 (×3): qty 30

## 2019-04-05 MED ORDER — MIDAZOLAM HCL 2 MG/2ML IJ SOLN
2.0000 mg | INTRAMUSCULAR | Status: DC | PRN
  Administered 2019-04-05 (×2): 2 mg via INTRAVENOUS
  Filled 2019-04-05 (×2): qty 2

## 2019-04-05 MED ORDER — CHLORDIAZEPOXIDE HCL 10 MG PO CAPS
10.0000 mg | ORAL_CAPSULE | Freq: Three times a day (TID) | ORAL | Status: DC
  Administered 2019-04-05 – 2019-04-07 (×9): 10 mg via ORAL
  Filled 2019-04-05 (×9): qty 1

## 2019-04-05 MED ORDER — CALCIUM GLUCONATE-NACL 2-0.675 GM/100ML-% IV SOLN
2.0000 g | Freq: Once | INTRAVENOUS | Status: AC
  Administered 2019-04-05: 04:00:00 2000 mg via INTRAVENOUS
  Filled 2019-04-05: qty 100

## 2019-04-05 NOTE — Progress Notes (Signed)
Patient's restraint order was renewed this am at 0728 and due to software malfunction the order canceled at 1730. This RN was unaware of the malfunction but did get it renewed again in the computer at 1830. Charting was still done on the restraints per policy during shift

## 2019-04-05 NOTE — Progress Notes (Signed)
[facility] Progress Note Patient Name: Richard Calhoun DOB: 12-24-1960 MRN: [id number]   Date of Service  04/05/2019  HPI/Events of Note  Mg++ = 1.5 and Creatinine = 1.23.   eICU Interventions  Will replace Mg++.      Intervention Category Major Interventions: Electrolyte abnormality - evaluation and management  Sommer,Steven Eugene 04/05/2019, 5:38 AM

## 2019-04-05 NOTE — Progress Notes (Signed)
NAME:  Richard Calhoun, MRN:  [id number], DOB:  June 26, 1961, LOS: 6 ADMISSION DATE:  03/30/2019, CONSULTATION DATE:  04/01/19 REFERRING MD:  Landis Gandy, MD CHIEF COMPLAINT:  Acute respiratory failure  Brief History   58 year old male with alcohol abuse who presents hematemesis. Has had worsening mental status, tachypnea and oxygen requirement. Placed on BiPAP overnight. Transferred to PCCM for acute respiratory failure  History of present illness   Unable to obtain history due to altered mental status. Chart reviewed and staff spoke to wife for history.  Richard Calhoun is a 58 year old male with hx of alcohol abuse who presented to ED with abdominal pain, nausea and vomiting, and episode of hematemesis. Per his wife, she noticed he had a poor appetite at least for the last two weeks. He drinks 2-3 drinks with her each night. He has been more noticeably drowsy and sleeping continuously for the last five days. He began complaining of left-sided abdominal pain associated with nausea and vomiting for three days that progressively worsened to the point that it was unbearable. The day before he presented he had an episode of hematemesis. Wife does note low level bottle of rubbing alcohol.   Past Medical History  CAD s/p DES, HTN, HLD  Significant Hospital Events   9/11 Admitted on BiPAP 9/13 ICU consulted and patient intubated for acute toxic-metabolic encephalopathy [id number] GI planning for EGD  Consults:  PCCM  Procedures:  ETT 9/13> L CVC 9/14> Significant Diagnostic Tests:  CT CAP 9/13 - Mildly enlarged subcarinal and retroperitoneal lymph nodes, RLL scarring, hepatomegaly, abnormal pancreas tail lesion  Micro Data:  9/14 Blood cx good gram-positive cocci in clusters, possible contaminant discussed with pharmacy. 9/11: MRSA PCR negative  Antimicrobials:  Vancomycin-stop Ceftriaxone x5 days stop date placed  Interim history/subjective:   Patient remains critically ill intubated  on mechanical life support  Objective   Blood pressure (!) 103/58, pulse 61, temperature 98 F (36.7 C), temperature source Axillary, resp. rate (!) 25, height 5\' 10"  (1.778 m), weight 88.9 kg, SpO2 99 %.    Vent Mode: PRVC FiO2 (%):  [30 %] 30 % Set Rate:  [25 bmp] 25 bmp Vt Set:  [430 mL] 430 mL PEEP:  [5 cmH20] 5 cmH20 Plateau Pressure:  [14 cmH20-27 cmH20] 17 cmH20   Intake/Output Summary (Last 24 hours) at 04/05/2019 0944 Last data filed at 04/05/2019 0600 Gross per 24 hour  Intake 4082.68 ml  Output 900 ml  Net 3182.68 ml   Filed Weights   03/30/19 1800  Weight: 88.9 kg   Physical Exam: General: Ill-appearing, elderly intubated on mechanical life support HENT: NCAT, pupils reactive to light does not wake to voice no spontaneous eye movements Respiratory: intubated, bilateral ventilated breath sounds Cardiovascular: Regular rate and rhythm, S1-S2 no MRG GI: Distended, bowel sounds present, Extremities: No significant edema Neuro: Sedated, RA SS -5 GU: condom cath in place   Resolved Hospital Problem list   Metabolic acidosis with elevated osmolal gap 2/2 alcoholic/starvation ketoacidosis - Volatile panel + acetone. AKI  Assessment & Plan:   Acute hypoxemic respiratory failure requiring intubation mechanical ventilation secondary to inability to protect airway, possible delirium secondary to alcohol withdrawal Continue full mechanical ventilatory support. Continue to wean sedatives as tolerated Goal RA SS 0 to -1 Mechanical support right now is minimal and limited due to clinical status at this time. Decreased continuous fentanyl threshold to 200 mics per hour currently on 400  Hypernatremia Continue to follow BMP  Free water given  Severe electrolyte abnormalities: hypokalemia, hypomag, hypophosphorous Electrolyte abnormalities, follow with BMP Replete as necessary.  Goal potassium greater than 4, magnesium greater than 2, phosphorus  Delirium, possible  alcohol withdrawal, acute metabolic encephalopathy: acidosis, hyperammonemia, electrolyte abnormalities.  Drug screen +benzo post-hospitalization, ethanol <10 Likely related to alcohol use Continue lactulose 3 times daily Added Librium 10 mg 3 times daily Wean from Precedex Added PRN Versed 2 mg  Hypotension secondary to sedation. Requiring minimal support Suspect hypotension was related to sedatives. Continue to wean off phenylephrine.  Anemia Thrombocytopenia Use for hemoglobin greater than 7 Appreciate GI input and their expertise Continue PPI  Hematemesis.  Resolved Alcoholic hepatitis. Viral hepatitis panel neg Severe hepatic steatosis Elevated INR s/p Vit K Thiamine folate supplementation  Abnormal pancreas tail lesion on CT Stable off vent will need MRCP/abdominal MRI  CAD s/p stent 04/2017 Continue ASA  Best practice:  Diet: NPO. Will consider TF post-EGD Pain/Anxiety/Delirium protocol (if indicated): Fentanyl gtt, Precedex VAP protocol (if indicated): Yes DVT prophylaxis: Holding in setting of thrombocytopenia GI prophylaxis: PPI Glucose control: CBG q4h Mobility: BR Code Status: Full code Family Communication: Updated "wife" [name] on 9/16 Disposition: ICU  Labs   CBC: Recent Labs  Lab 04/02/19 0400 04/03/19 0417 04/03/19 1240 04/04/19 0440 04/05/19 0200  WBC 9.5 6.2 5.6 6.2 5.0  HGB 7.9* 7.7* 7.5* 7.4* 7.0*  HCT 24.6* 24.7* 23.8* 23.7* 22.9*  MCV 120.6* 121.7* 121.4* 122.2* 123.8*  PLT 38* 30* 36* PLATELET CLUMPS NOTED ON SMEAR, COUNT APPEARS DECREASED 55*    Basic Metabolic Panel: Recent Labs  Lab 04/03/19 1240 04/03/19 1710 04/03/19 1810 04/03/19 2112 04/04/19 0440 04/04/19 0949 04/04/19 1700 04/05/19 0200  NA 143  --  148*  --  145  --  145 145  K 6.1*  --  3.4* 3.2* 3.8 3.9 4.0 3.7  CL 115*  --  119*  --  115*  --  117* 116*  CO2 21*  --  20*  --  19*  --  18* 15*  GLUCOSE 324*  --  159*  --  169*  --  156* 137*  BUN 30*  --   29*  --  27*  --  30* 29*  CREATININE 0.96  --  1.00  --  0.97  --  1.16 1.23  CALCIUM 6.7*  --  6.8*  --  6.6*  --  6.2* 6.4*  MG  --  1.0* 1.1*  --  1.4*  --  1.5* 1.5*  PHOS  --  1.4* 1.4*  --  4.0  --  3.7 3.7   GFR: Estimated Creatinine Clearance: 71.7 mL/min (by C-G formula based on SCr of 1.23 mg/dL). Recent Labs  Lab 04/01/19 1130 04/01/19 1412  04/03/19 0417 04/03/19 1240 04/04/19 0440 04/05/19 0200  WBC  --   --    < > 6.2 5.6 6.2 5.0  LATICACIDVEN 1.3 0.7  --   --   --   --   --    < > = values in this interval not displayed.    Liver Function Tests: Recent Labs  Lab 04/01/19 1129 04/02/19 1000 04/03/19 0417 04/04/19 0440 04/05/19 0200  AST 200* 177* 151* 105* 75*  ALT 97* 92* 82* 71* 56*  ALKPHOS 319* 280* 262* 248* 216*  BILITOT 5.4* 3.8* 3.3* 2.6* 2.3*  PROT 7.5 6.6 6.2* 6.2* 5.7*  ALBUMIN 2.8* 2.5* 2.2* 2.2* 2.0*   Recent Labs  Lab 03/30/19 0442  LIPASE 64*  Recent Labs  Lab 04/01/19 1134  AMMONIA 98*    ABG    Component Value Date/Time   PHART 7.249 (L) 04/01/2019 1338   PCO2ART 22.1 (L) 04/01/2019 1338   PO2ART 479 (H) 04/01/2019 1338   HCO3 24.1 04/02/2019 0219   TCO2 20 (L) 04/14/2017 1657   ACIDBASEDEF 0.7 04/02/2019 0219   O2SAT 95.3 04/02/2019 0219     Coagulation Profile: Recent Labs  Lab 04/01/19 0257 04/02/19 0400 04/03/19 0417 04/04/19 0440 04/05/19 0200  INR 1.5* 1.3* 1.5* 1.5* 1.6*    Cardiac Enzymes: No results for input(s): CKTOTAL, CKMB, CKMBINDEX, TROPONINI in the last 168 hours.  HbA1C: Hemoglobin A1C  Date/Time Value Ref Range Status  12/23/2016 12:01 PM 5.6  Final   Hgb A1c MFr Bld  Date/Time Value Ref Range Status  04/15/2017 02:34 AM 5.9 (H) 4.8 - 5.6 % Final    Comment:    (NOTE) Pre diabetes:          5.7%-6.4% Diabetes:              >6.4% Glycemic control for   <7.0% adults with diabetes   08/29/2012 03:20 PM 6.0 4.6 - 6.5 % Final    Comment:    Glycemic Control Guidelines for People  with Diabetes:Non Diabetic:  <6%Goal of Therapy: <7%Additional Action Suggested:  >8%     CBG: Recent Labs  Lab 04/04/19 1519 04/04/19 1942 04/04/19 2319 04/05/19 0307 04/05/19 0750  GLUCAP 118* 129* 121* 120* 144*    This patient is critically ill with multiple organ system failure; which, requires frequent high complexity decision making, assessment, support, evaluation, and titration of therapies. This was completed through the application of advanced monitoring technologies and extensive interpretation of multiple databases. During this encounter critical care time was devoted to patient care services described in this note for 36 minutes.   Garner Nash, DO [facility] 04/05/2019 9:45 AM  Personal pager: [phone] If unanswered, please page CCM On-call: [phone]

## 2019-04-05 NOTE — Progress Notes (Signed)
[facility] Progress Note  CC:  Hematemesis/AMS/ETOH abuse  Subjective:  Day #6   EGD on 9/16 showed the following:  - No gross lesions in esophagus proximally. Grade I esophageal varices distally. - Portal hypertensive gastropathy. Non-bleeding erosive gastropathy. - Duodenal erosions without bleeding. - Feeding tube placement was successfully performed. - No specimens collected due to chronic thrombocytopenia.  Intubated, sedated, on pressors.  Hgb 7.0 grams.  Did not get to talk to the nurse as she was busy in another room.  Objective:  Vital signs in last 24 hours: Temp:  [97.8 F (36.6 C)-100.8 F (38.2 C)] 98 F (36.7 C) (09/17 0700) Pulse Rate:  [59-153] 61 (09/17 0900) Resp:  [5-25] 25 (09/17 0900) BP: (89-174)/(45-95) 103/58 (09/17 0900) SpO2:  [92 %-100 %] 99 % (09/17 0900) FiO2 (%):  [30 %] 30 % (09/17 0747) Last BM Date: 04/04/19 General:  Intubated, sedated. Heart:  Regular rate and rhythm; no murmurs Pulm:  CTAB. Abdomen:  Soft, obese, non-distended.  BS present.  Extremities:  Without edema. Neurologic:  Sedated.  Intake/Output from previous day: 09/16 0701 - 09/17 0700 In: 4602.7 [I.V.:2330.1; Blood:212; NG/GT:1080; IV Piggyback:980.5] Out: 900 [Urine:900]  Lab Results: Recent Labs    04/03/19 1240 04/04/19 0440 04/05/19 0200  WBC 5.6 6.2 5.0  HGB 7.5* 7.4* 7.0*  HCT 23.8* 23.7* 22.9*  PLT 36* PLATELET CLUMPS NOTED ON SMEAR, COUNT APPEARS DECREASED 55*   BMET Recent Labs    04/04/19 0440 04/04/19 0949 04/04/19 1700 04/05/19 0200  NA 145  --  145 145  K 3.8 3.9 4.0 3.7  CL 115*  --  117* 116*  CO2 19*  --  18* 15*  GLUCOSE 169*  --  156* 137*  BUN 27*  --  30* 29*  CREATININE 0.97  --  1.16 1.23  CALCIUM 6.6*  --  6.2* 6.4*   LFT Recent Labs    04/05/19 0200  PROT 5.7*  ALBUMIN 2.0*  AST 75*  ALT 56*  ALKPHOS 216*  BILITOT 2.3*   PT/INR Recent Labs    04/04/19 0440 04/05/19 0200  LABPROT 17.9*  18.8*  INR 1.5* 1.6*   Dg Abd 1 View  Result Date: 04/04/2019 CLINICAL DATA:  58 year old male status post NG tube placement. EXAM: ABDOMEN - 1 VIEW COMPARISON:  Earlier radiograph dated 04/04/2019 FINDINGS: Partially visualized NG tube with side-port just distal to the gastroesophageal junction and tip in the proximal stomach. The tube appears to have slightly been pulled back since the prior radiograph. IMPRESSION: NG tube with tip in the proximal stomach. Electronically Signed   By: Anner Crete M.D.   On: 04/04/2019 13:47   Dg Abd 1 View  Result Date: 04/04/2019 CLINICAL DATA:  Enteric tube placement. EXAM: ABDOMEN - 1 VIEW COMPARISON:  Abdominal x-ray dated April 01, 2019. FINDINGS: Enteric tube tip in the gastric fundus. Mild gaseous distention of the transverse colon. No dilated small bowel loops. No acute osseous abnormality. IMPRESSION: 1. Enteric tube within the stomach. Electronically Signed   By: Titus Dubin M.D.   On: 04/04/2019 10:09   Assessment / Plan: #56 58 year old male history of chronic EtOH use admitted with altered mental status, had been complaining of abdominal pain, nausea and vomiting, and an episode of hematemesis.  EGD with grade 1 esophageal varices and portal hypertensive gastropathy on 9/16.  Hgb down to 7.0 grams this AM.  Did not get to speak to the nurse this AM although no significant  GI bleeding documented anywhere.  Suspect underlying alcoholic hepatitis, probable cirrhosis and hepatic encephalopathy.  He has significant thrombocytopenia with platelets in the 55 range today after transfusion with one unit of platelets yesterday.  I ordered Hpylori serology so that is now pending.  If positive will treat.  Continue pantoprazole 40 mg IV BID for now.  #2 persistent hypotension-requiring pressors #3 coronary artery disease status post stent, on antiplatelet prior to admission #4 metabolic acidosis/acute respiratory failure-Per CCM. #5  abnormal pancreatic tail on CT-we will need MRI of the abdomen when more stable   LOS: 6 days   Pearson Picou D. Timberlyn Pickford  04/05/2019, 9:09 AM

## 2019-04-05 NOTE — Progress Notes (Signed)
[facility] Progress Note Patient Name: Richard Calhoun DOB: [birth year] MRN: [id number]   Date of Service  04/05/2019  HPI/Events of Note  Hypocalcemia - Ca++ = 6.4 which corrects to 8.0 (low) given albumin = 2.0.  eICU Interventions  Will replace Ca++.     Intervention Category Major Interventions: Electrolyte abnormality - evaluation and management  Jovian Lembcke Eugene 04/05/2019, 2:54 AM

## 2019-04-06 ENCOUNTER — Encounter (HOSPITAL_COMMUNITY): Payer: Self-pay | Admitting: Gastroenterology

## 2019-04-06 ENCOUNTER — Inpatient Hospital Stay (HOSPITAL_COMMUNITY): Payer: Self-pay

## 2019-04-06 DIAGNOSIS — R410 Disorientation, unspecified: Secondary | ICD-10-CM

## 2019-04-06 DIAGNOSIS — F10229 Alcohol dependence with intoxication, unspecified: Secondary | ICD-10-CM

## 2019-04-06 LAB — CBC
HCT: 26 % — ABNORMAL LOW (ref 39.0–52.0)
Hemoglobin: 7.9 g/dL — ABNORMAL LOW (ref 13.0–17.0)
MCH: 38.7 pg — ABNORMAL HIGH (ref 26.0–34.0)
MCHC: 30.4 g/dL (ref 30.0–36.0)
MCV: 127.5 fL — ABNORMAL HIGH (ref 80.0–100.0)
Platelets: 74 10*3/uL — ABNORMAL LOW (ref 150–400)
RBC: 2.04 MIL/uL — ABNORMAL LOW (ref 4.22–5.81)
RDW: 16.6 % — ABNORMAL HIGH (ref 11.5–15.5)
WBC: 11.3 10*3/uL — ABNORMAL HIGH (ref 4.0–10.5)
nRBC: 0.4 % — ABNORMAL HIGH (ref 0.0–0.2)

## 2019-04-06 LAB — COMPREHENSIVE METABOLIC PANEL
ALT: 59 U/L — ABNORMAL HIGH (ref 0–44)
AST: 94 U/L — ABNORMAL HIGH (ref 15–41)
Albumin: 2.2 g/dL — ABNORMAL LOW (ref 3.5–5.0)
Alkaline Phosphatase: 253 U/L — ABNORMAL HIGH (ref 38–126)
Anion gap: 9 (ref 5–15)
BUN: 37 mg/dL — ABNORMAL HIGH (ref 6–20)
CO2: 16 mmol/L — ABNORMAL LOW (ref 22–32)
Calcium: 6.8 mg/dL — ABNORMAL LOW (ref 8.9–10.3)
Chloride: 124 mmol/L — ABNORMAL HIGH (ref 98–111)
Creatinine, Ser: 1.36 mg/dL — ABNORMAL HIGH (ref 0.61–1.24)
GFR calc Af Amer: >60 mL/min (ref >60)
GFR calc non Af Amer: 56 mL/min — ABNORMAL LOW (ref >60)
Glucose, Bld: 134 mg/dL — ABNORMAL HIGH (ref 70–99)
Potassium: 4.3 mmol/L (ref 3.5–5.1)
Sodium: 149 mmol/L — ABNORMAL HIGH (ref 135–145)
Total Bilirubin: 2.8 mg/dL — ABNORMAL HIGH (ref 0.3–1.2)
Total Protein: 6.5 g/dL (ref 6.5–8.1)

## 2019-04-06 LAB — GLUCOSE, CAPILLARY
Glucose-Capillary: 107 mg/dL — ABNORMAL HIGH (ref 70–99)
Glucose-Capillary: 108 mg/dL — ABNORMAL HIGH (ref 70–99)
Glucose-Capillary: 118 mg/dL — ABNORMAL HIGH (ref 70–99)
Glucose-Capillary: 125 mg/dL — ABNORMAL HIGH (ref 70–99)
Glucose-Capillary: 129 mg/dL — ABNORMAL HIGH (ref 70–99)
Glucose-Capillary: 88 mg/dL (ref 70–99)
Glucose-Capillary: 89 mg/dL (ref 70–99)

## 2019-04-06 LAB — BASIC METABOLIC PANEL
Anion gap: 15 (ref 5–15)
BUN: 32 mg/dL — ABNORMAL HIGH (ref 6–20)
CO2: 15 mmol/L — ABNORMAL LOW (ref 22–32)
Calcium: 7 mg/dL — ABNORMAL LOW (ref 8.9–10.3)
Chloride: 120 mmol/L — ABNORMAL HIGH (ref 98–111)
Creatinine, Ser: 1.47 mg/dL — ABNORMAL HIGH (ref 0.61–1.24)
GFR calc Af Amer: 59 mL/min — ABNORMAL LOW (ref >60)
GFR calc non Af Amer: 51 mL/min — ABNORMAL LOW (ref >60)
Glucose, Bld: 135 mg/dL — ABNORMAL HIGH (ref 70–99)
Potassium: 3.6 mmol/L (ref 3.5–5.1)
Sodium: 150 mmol/L — ABNORMAL HIGH (ref 135–145)

## 2019-04-06 LAB — PROTIME-INR
INR: 1.5 — ABNORMAL HIGH (ref 0.8–1.2)
Prothrombin Time: 17.8 seconds — ABNORMAL HIGH (ref 11.4–15.2)

## 2019-04-06 LAB — MAGNESIUM
Magnesium: 1.8 mg/dL (ref 1.7–2.4)
Magnesium: 1.5 mg/dL — ABNORMAL LOW (ref 1.7–2.4)

## 2019-04-06 LAB — H. PYLORI ANTIBODY, IGG: H Pylori IgG: 0.3 Index Value (ref 0.00–0.79)

## 2019-04-06 LAB — PHOSPHORUS
Phosphorus: 4.6 mg/dL (ref 2.5–4.6)
Phosphorus: 4.4 mg/dL (ref 2.5–4.6)

## 2019-04-06 MED ORDER — LACTULOSE 10 GM/15ML PO SOLN
20.0000 g | Freq: Three times a day (TID) | ORAL | Status: DC
  Administered 2019-04-06: 20 g via ORAL
  Filled 2019-04-06: qty 30

## 2019-04-06 MED ORDER — FUROSEMIDE 10 MG/ML IJ SOLN
40.0000 mg | Freq: Three times a day (TID) | INTRAMUSCULAR | Status: DC
  Administered 2019-04-06 – 2019-04-08 (×7): 40 mg via INTRAVENOUS
  Filled 2019-04-06 (×7): qty 4

## 2019-04-06 MED ORDER — METOLAZONE 5 MG PO TABS
5.0000 mg | ORAL_TABLET | Freq: Once | ORAL | Status: AC
  Administered 2019-04-06: 12:00:00 5 mg via ORAL
  Filled 2019-04-06: qty 1

## 2019-04-06 MED ORDER — ORAL CARE MOUTH RINSE
15.0000 mL | Freq: Two times a day (BID) | OROMUCOSAL | Status: DC
  Administered 2019-04-06 – 2019-05-25 (×95): 15 mL via OROMUCOSAL

## 2019-04-06 NOTE — Progress Notes (Addendum)
[facility] Progress Note  CC:  Hematemesis/AMS/ETOH abuse  Subjective:  Day #7  Nurse reports that he is having bowel movements after the lactulose enema and increase in lactulose PO dosing.  They are trying to wean him and plan to try to extubate him later today.  Turned off sedation and he is opening his eyes and moving around, biting ET tube.  Hgb 7.9 grams.  Objective:  Vital signs in last 24 hours: Temp:  [97.7 F (36.5 C)-98.7 F (37.1 C)] 98.4 F (36.9 C) (09/18 1212) Pulse Rate:  [58-112] 108 (09/18 1215) Resp:  [7-35] 18 (09/18 1215) BP: (86-146)/(47-94) 131/84 (09/18 1215) SpO2:  [90 %-100 %] 100 % (09/18 1215) FiO2 (%):  [30 %] 30 % (09/18 1055) Last BM Date: 04/06/19 General:  Alert, but still intubated.  Opening eyes and moving extremities. Heart:  Regular rate and rhythm; no murmurs Pulm:  CTAB.  No increased WOB. Abdomen:  Soft, obese.  BS present.   Extremities:  Without edema. Neurologic:  Opening eyes and moving extremities.  Shook his head to acknowledge my presence when I said hello to him.  Intake/Output from previous day: 09/17 0701 - 09/18 0700 In: 2274 [I.V.:1574.1; NG/GT:600; IV Piggyback:99.9] Out: 400 [Urine:400] Intake/Output this shift: Total I/O In: 329.1 [I.V.:329.1] Out: -   Lab Results: Recent Labs    04/04/19 0440 04/05/19 0200 04/06/19 0500  WBC 6.2 5.0 11.3*  HGB 7.4* 7.0* 7.9*  HCT 23.7* 22.9* 26.0*  PLT PLATELET CLUMPS NOTED ON SMEAR, COUNT APPEARS DECREASED 55* 74*   BMET Recent Labs    04/05/19 0200 04/05/19 1724 04/06/19 0500  NA 145 146* 149*  K 3.7 3.8 4.3  CL 116* 121* 124*  CO2 15* 16* 16*  GLUCOSE 137* 137* 134*  BUN 29* 31* 37*  CREATININE 1.23 1.19 1.36*  CALCIUM 6.4* 6.7* 6.8*   LFT Recent Labs    04/06/19 0500  PROT 6.5  ALBUMIN 2.2*  AST 94*  ALT 59*  ALKPHOS 253*  BILITOT 2.8*   PT/INR Recent Labs    04/05/19 0200 04/06/19 0500  LABPROT 18.8* 17.8*  INR 1.6* 1.5*    Dg Abd 1 View  Result Date: 04/06/2019 CLINICAL DATA:  Ileus. EXAM: ABDOMEN - 1 VIEW COMPARISON:  Radiograph of April 04, 2019. FINDINGS: Stable mild gaseous distention of transverse colon and cecum. No small bowel dilatation is noted. No radio-opaque calculi or other significant radiographic abnormality are seen. IMPRESSION: Stable mildly distended air-filled colon is noted. No other abnormality seen. Electronically Signed   By: Marijo Conception M.D.   On: 04/06/2019 08:22   Dg Abd 1 View  Result Date: 04/04/2019 CLINICAL DATA:  58 year old male status post NG tube placement. EXAM: ABDOMEN - 1 VIEW COMPARISON:  Earlier radiograph dated 04/04/2019 FINDINGS: Partially visualized NG tube with side-port just distal to the gastroesophageal junction and tip in the proximal stomach. The tube appears to have slightly been pulled back since the prior radiograph. IMPRESSION: NG tube with tip in the proximal stomach. Electronically Signed   By: Anner Crete M.D.   On: 04/04/2019 13:47   Dg Chest Port 1 View  Result Date: 04/05/2019 CLINICAL DATA:  Follow-up mechanically assisted ventilation. EXAM: PORTABLE CHEST 1 VIEW COMPARISON:  Chest x-rays dated 04/02/2019 and 04/01/2019 FINDINGS: Support apparatus remains appropriately positioned. Stable mild central pulmonary vascular congestion. No confluent opacity to suggest a developing pneumonia or alveolar pulmonary edema. Probable small LEFT pleural effusion. No pneumothorax seen. IMPRESSION:  Stable chest x-ray. No evidence of pneumonia or alveolar pulmonary edema. Probable small LEFT pleural effusion. Electronically Signed   By: Franki Cabot M.D.   On: 04/05/2019 11:57   Assessment / Plan: #70 58 year old male history of chronic EtOH use admitted with altered mental status, had been complaining of abdominal pain, nausea and vomiting, and an episode of hematemesis.  EGD with grade 1 esophageal varices and portal hypertensive gastropathy on 9/16.  Hgb  7.9 grams this AM.  No sign of bleeding.  Suspect underlying alcoholic hepatitis, probable cirrhosis and hepatic encephalopathy.  Increased lactulose to TID on 9/17 and also ordered a lactulose enema.  He has significant thrombocytopenia with platelets in the 74 today after transfusion with one unit of platelets 9/16.  Hpylori serology negative.  Continue pantoprazole 40 mg IV BID for now.  #2 persistent hypotension-requiring pressors #3 coronary artery disease status post stent, on antiplatelet prior to admission #4 metabolic acidosis/acute respiratory failure-Per CCM.  Trying to wean and possibly extubate today. #5 abnormal pancreatic tail on CT-we will need MRI of the abdomen when more stable   LOS: 7 days   Jessica D. Zehr  04/06/2019, 12:32 PM   Attending physician's note   I have taken an interval history, reviewed the chart and examined the patient. I agree with the Advanced Practitioner's note, impression and recommendations.   Mental status improving with lactulose through OG tube and enema  Acute alcoholic hepatitis, underlying cirrhosis with hepatic encephalopathy Not a candidate for steroid therapy Continue supportive care  We will continue to follow   K. Denzil Magnuson , MD [phone]

## 2019-04-06 NOTE — Progress Notes (Signed)
Pt still too agitated and on too many lines to obtain MRI. 3rd day on work list. Luna Kitchens order can be replaced when pt is more compliant. Or has had marked improvement to complete his MRI/MCRP scan.

## 2019-04-06 NOTE — Progress Notes (Signed)
NAME:  Richard Calhoun, MRN:  [id number], DOB:  01/05/1961, LOS: 7 ADMISSION DATE:  03/30/2019, CONSULTATION DATE:  04/01/19 REFERRING MD:  Landis Gandy, MD CHIEF COMPLAINT:  Acute respiratory failure  Brief History   58 year old male with alcohol abuse who presents hematemesis. Has had worsening mental status, tachypnea and oxygen requirement. Placed on BiPAP overnight. Transferred to PCCM for acute respiratory failure  History of present illness   Unable to obtain history due to altered mental status. Chart reviewed and staff spoke to wife for history.  Richard Calhoun is a 58 year old male with hx of alcohol abuse who presented to ED with abdominal pain, nausea and vomiting, and episode of hematemesis. Per his wife, she noticed he had a poor appetite at least for the last two weeks. He drinks 2-3 drinks with her each night. He has been more noticeably drowsy and sleeping continuously for the last five days. He began complaining of left-sided abdominal pain associated with nausea and vomiting for three days that progressively worsened to the point that it was unbearable. The day before he presented he had an episode of hematemesis. Wife does note low level bottle of rubbing alcohol.   Past Medical History  CAD s/p DES, HTN, HLD  Significant Hospital Events   9/11 Admitted on BiPAP 9/13 ICU consulted and patient intubated for acute toxic-metabolic encephalopathy [id number] GI planning for EGD  Consults:  PCCM  Procedures:  ETT 9/13> L CVC 9/14> Significant Diagnostic Tests:  CT CAP 9/13 - Mildly enlarged subcarinal and retroperitoneal lymph nodes, RLL scarring, hepatomegaly, abnormal pancreas tail lesion  Micro Data:  9/14 Blood cx good gram-positive cocci in clusters, possible contaminant discussed with pharmacy. 9/11: MRSA PCR negative  Antimicrobials:  Vancomycin-stop Ceftriaxone x5 days stop date placed  Interim history/subjective:   Remains in ICU, critically ill, on  mechanical support   Objective   Blood pressure 108/67, pulse 90, temperature 98 F (36.7 C), temperature source Oral, resp. rate 13, height 5\' 10"  (1.778 m), weight 88.9 kg, SpO2 100 %.    Vent Mode: PRVC FiO2 (%):  [30 %] 30 % Set Rate:  [25 bmp] 25 bmp Vt Set:  [430 mL] 430 mL PEEP:  [5 cmH20] 5 cmH20 Plateau Pressure:  [14 cmH20-30 cmH20] 20 cmH20   Intake/Output Summary (Last 24 hours) at 04/06/2019 1050 Last data filed at 04/06/2019 1022 Gross per 24 hour  Intake 2603.06 ml  Output 150 ml  Net 2453.06 ml   Filed Weights   03/30/19 1800  Weight: 88.9 kg   Physical Exam: General: intubated, sedated  HENT: NCAT, pupils reactive  Respiratory: intubated, on mech vent, BL vented breath sounds  Cardiovascular: RRR, S1 s2  GI: mildley distended, soft  Extremities: no sig edema  Neuro: Sedated, will follow commands  GU: condom cath   Resolved Hospital Problem list   Metabolic acidosis with elevated osmolal gap 2/2 alcoholic/starvation ketoacidosis - Volatile panel + acetone. AKI  Assessment & Plan:   Acute hypoxemic respiratory failure requiring intubation mechanical ventilation secondary to inability to protect airway, possible delirium secondary to alcohol withdrawal Stop sedation SBT this morning If tolerates will plan for liberation from mechanical support.  AKI  Hypernatremia Positive cumulative fluid balance Elevated serum creatinine Follow BMP Free water Start diuresis and follow UOP   Severe electrolyte abnormalities: hypokalemia, hypomag, hypophosphorous The as necessary, goal potassium greater than 4, magnesium greater than 2  Delirium, possible alcohol withdrawal, acute metabolic encephalopathy: acidosis, hyperammonemia, electrolyte  abnormalities.  Drug screen +benzo post-hospitalization, ethanol <10 Likely related to alcohol use Continue lactulose Continue Librium Wean off Precedex  Hypotension secondary to sedation. Requiring minimal support  Suspect hypotension is related to medication effect Wean from Neo-Synephrine as tolerated.  Maintain mean arterial pressure greater than 65  Anemia Thrombocytopenia Appreciate GI input Continue PPI  Hematemesis.  Resolved Alcoholic hepatitis. Viral hepatitis panel neg Severe hepatic steatosis Elevated INR s/p Vit K Thiamine folate supplementation  Abnormal pancreas tail lesion on CT Stable off and will need MRCP/abdominal MRI  CAD s/p stent 04/2017 Will need to restart aspirin  Best practice:  Diet: NPO. Will consider TF post-EGD Pain/Anxiety/Delirium protocol (if indicated): Fentanyl gtt, Precedex VAP protocol (if indicated): Yes DVT prophylaxis: Holding in setting of thrombocytopenia GI prophylaxis: PPI Glucose control: CBG q4h Mobility: BR Code Status: Full code Family Communication: Updated "wife" [name] on 9/16 Disposition: ICU  Labs   CBC: Recent Labs  Lab 04/03/19 0417 04/03/19 1240 04/04/19 0440 04/05/19 0200 04/06/19 0500  WBC 6.2 5.6 6.2 5.0 11.3*  HGB 7.7* 7.5* 7.4* 7.0* 7.9*  HCT 24.7* 23.8* 23.7* 22.9* 26.0*  MCV 121.7* 121.4* 122.2* 123.8* 127.5*  PLT 30* 36* PLATELET CLUMPS NOTED ON SMEAR, COUNT APPEARS DECREASED 55* 74*    Basic Metabolic Panel: Recent Labs  Lab 04/04/19 0440 04/04/19 0949 04/04/19 1700 04/05/19 0200 04/05/19 1724 04/06/19 0500  NA 145  --  145 145 146* 149*  K 3.8 3.9 4.0 3.7 3.8 4.3  CL 115*  --  117* 116* 121* 124*  CO2 19*  --  18* 15* 16* 16*  GLUCOSE 169*  --  156* 137* 137* 134*  BUN 27*  --  30* 29* 31* 37*  CREATININE 0.97  --  1.16 1.23 1.19 1.36*  CALCIUM 6.6*  --  6.2* 6.4* 6.7* 6.8*  MG 1.4*  --  1.5* 1.5* 1.9 1.8  PHOS 4.0  --  3.7 3.7 4.1 4.6   GFR: Estimated Creatinine Clearance: 64.9 mL/min (A) (by C-G formula based on SCr of 1.36 mg/dL (H)). Recent Labs  Lab 04/01/19 1130 04/01/19 1412  04/03/19 1240 04/04/19 0440 04/05/19 0200 04/06/19 0500  WBC  --   --    < > 5.6 6.2 5.0 11.3*   LATICACIDVEN 1.3 0.7  --   --   --   --   --    < > = values in this interval not displayed.    Liver Function Tests: Recent Labs  Lab 04/02/19 1000 04/03/19 0417 04/04/19 0440 04/05/19 0200 04/06/19 0500  AST 177* 151* 105* 75* 94*  ALT 92* 82* 71* 56* 59*  ALKPHOS 280* 262* 248* 216* 253*  BILITOT 3.8* 3.3* 2.6* 2.3* 2.8*  PROT 6.6 6.2* 6.2* 5.7* 6.5  ALBUMIN 2.5* 2.2* 2.2* 2.0* 2.2*   No results for input(s): LIPASE, AMYLASE in the last 168 hours. Recent Labs  Lab 04/01/19 1134  AMMONIA 98*    ABG    Component Value Date/Time   PHART 7.249 (L) 04/01/2019 1338   PCO2ART 22.1 (L) 04/01/2019 1338   PO2ART 479 (H) 04/01/2019 1338   HCO3 24.1 04/02/2019 0219   TCO2 20 (L) 04/14/2017 1657   ACIDBASEDEF 0.7 04/02/2019 0219   O2SAT 95.3 04/02/2019 0219     Coagulation Profile: Recent Labs  Lab 04/02/19 0400 04/03/19 0417 04/04/19 0440 04/05/19 0200 04/06/19 0500  INR 1.3* 1.5* 1.5* 1.6* 1.5*    Cardiac Enzymes: No results for input(s): CKTOTAL, CKMB, CKMBINDEX, TROPONINI in the last  168 hours.  HbA1C: Hemoglobin A1C  Date/Time Value Ref Range Status  12/23/2016 12:01 PM 5.6  Final   Hgb A1c MFr Bld  Date/Time Value Ref Range Status  04/15/2017 02:34 AM 5.9 (H) 4.8 - 5.6 % Final    Comment:    (NOTE) Pre diabetes:          5.7%-6.4% Diabetes:              >6.4% Glycemic control for   <7.0% adults with diabetes   08/29/2012 03:20 PM 6.0 4.6 - 6.5 % Final    Comment:    Glycemic Control Guidelines for People with Diabetes:Non Diabetic:  <6%Goal of Therapy: <7%Additional Action Suggested:  >8%     CBG: Recent Labs  Lab 04/05/19 1602 04/05/19 1924 04/06/19 0044 04/06/19 0418 04/06/19 0803  GLUCAP 120* 109* 108* 125* 88    This patient is critically ill with multiple organ system failure; which, requires frequent high complexity decision making, assessment, support, evaluation, and titration of therapies. This was completed through the  application of advanced monitoring technologies and extensive interpretation of multiple databases. During this encounter critical care time was devoted to patient care services described in this note for 38 minutes.   Garner Nash, DO [facility] 04/06/2019 11:01 AM  Personal pager: [phone] If unanswered, please page CCM On-call: [phone]

## 2019-04-06 NOTE — Procedures (Signed)
Extubation Procedure Note  Patient Details:   Name: Richard Calhoun DOB: 03/25/1961 MRN: [id number]   Airway Documentation:  Airway 7.5 mm (Active)  Secured at (cm) 24 cm 04/06/19 1055  Measured From Lips 04/06/19 1055  Secured Location Right 04/06/19 1055  Secured By [employer] 04/06/19 1055  Tube Holder Repositioned Yes 04/06/19 0732  Cuff Pressure (cm H2O) 28 cm H2O 04/06/19 0732  Site Condition Dry 04/06/19 0800   Vent end date: (not recorded) Vent end time: (not recorded)   Evaluation  O2 sats: stable throughout Complications: No apparent complications Patient did tolerate procedure well. Bilateral Breath Sounds: Diminished   Yes  Elsie Stain 04/06/2019, 1:40 PM

## 2019-04-06 NOTE — Progress Notes (Signed)
F/u  Fully awake. Following commands.  Placed him on SBT w/ excellent F/Vt. Based on this we progressed to extubation.   Plan Supplemental oxygen Minimize sedation Pulse ox NPO  Erick Colace ACNP-BC [place] Pager # [phone] OR # [phone] if no answer

## 2019-04-06 NOTE — Progress Notes (Signed)
Pt's sedation was turned down and became very agitated. HR 140. The nurse gave him 5 of fentanyl and then he did breath on wean. Pt becomes very agitated when sedation is lowered. RT will continue to monitor

## 2019-04-06 NOTE — Progress Notes (Signed)
Notified by MRI that current order for MRI will be discontinued until patient is stable.

## 2019-04-06 NOTE — Progress Notes (Signed)
Pt extubated to a 2L [place] per doctors orders. Leak test good, followed directions, and vitals are good. Pt was able to talk after extubation and asked for water and to go home. RT will continue to monitor.

## 2019-04-07 ENCOUNTER — Encounter (HOSPITAL_COMMUNITY): Payer: Self-pay | Admitting: Internal Medicine

## 2019-04-07 ENCOUNTER — Inpatient Hospital Stay (HOSPITAL_COMMUNITY): Payer: Self-pay

## 2019-04-07 DIAGNOSIS — E872 Acidosis, unspecified: Secondary | ICD-10-CM | POA: Diagnosis present

## 2019-04-07 DIAGNOSIS — D539 Nutritional anemia, unspecified: Secondary | ICD-10-CM | POA: Diagnosis present

## 2019-04-07 DIAGNOSIS — I5032 Chronic diastolic (congestive) heart failure: Secondary | ICD-10-CM

## 2019-04-07 DIAGNOSIS — Z955 Presence of coronary angioplasty implant and graft: Secondary | ICD-10-CM

## 2019-04-07 DIAGNOSIS — D696 Thrombocytopenia, unspecified: Secondary | ICD-10-CM | POA: Diagnosis present

## 2019-04-07 DIAGNOSIS — M7989 Other specified soft tissue disorders: Secondary | ICD-10-CM

## 2019-04-07 DIAGNOSIS — E870 Hyperosmolality and hypernatremia: Secondary | ICD-10-CM | POA: Diagnosis not present

## 2019-04-07 HISTORY — DX: Chronic diastolic (congestive) heart failure: I50.32

## 2019-04-07 LAB — CBC
HCT: 26.4 % — ABNORMAL LOW (ref 39.0–52.0)
Hemoglobin: 8.3 g/dL — ABNORMAL LOW (ref 13.0–17.0)
MCH: 37.9 pg — ABNORMAL HIGH (ref 26.0–34.0)
MCHC: 31.4 g/dL (ref 30.0–36.0)
MCV: 120.5 fL — ABNORMAL HIGH (ref 80.0–100.0)
Platelets: UNDETERMINED 10*3/uL (ref 150–400)
RBC: 2.19 MIL/uL — ABNORMAL LOW (ref 4.22–5.81)
RDW: 16.4 % — ABNORMAL HIGH (ref 11.5–15.5)
WBC: 10.6 10*3/uL — ABNORMAL HIGH (ref 4.0–10.5)
nRBC: 0 % (ref 0.0–0.2)

## 2019-04-07 LAB — COMPREHENSIVE METABOLIC PANEL
ALT: 56 U/L — ABNORMAL HIGH (ref 0–44)
AST: 84 U/L — ABNORMAL HIGH (ref 15–41)
Albumin: 2.3 g/dL — ABNORMAL LOW (ref 3.5–5.0)
Alkaline Phosphatase: 275 U/L — ABNORMAL HIGH (ref 38–126)
Anion gap: 18 — ABNORMAL HIGH (ref 5–15)
BUN: 31 mg/dL — ABNORMAL HIGH (ref 6–20)
CO2: 16 mmol/L — ABNORMAL LOW (ref 22–32)
Calcium: 7.3 mg/dL — ABNORMAL LOW (ref 8.9–10.3)
Chloride: 114 mmol/L — ABNORMAL HIGH (ref 98–111)
Creatinine, Ser: 1.44 mg/dL — ABNORMAL HIGH (ref 0.61–1.24)
GFR calc Af Amer: >60 mL/min (ref >60)
GFR calc non Af Amer: 52 mL/min — ABNORMAL LOW (ref >60)
Glucose, Bld: 160 mg/dL — ABNORMAL HIGH (ref 70–99)
Potassium: 2.6 mmol/L — CL (ref 3.5–5.1)
Sodium: 148 mmol/L — ABNORMAL HIGH (ref 135–145)
Total Bilirubin: 2.5 mg/dL — ABNORMAL HIGH (ref 0.3–1.2)
Total Protein: 6.6 g/dL (ref 6.5–8.1)

## 2019-04-07 LAB — PROTIME-INR
INR: 1.5 — ABNORMAL HIGH (ref 0.8–1.2)
Prothrombin Time: 18.3 seconds — ABNORMAL HIGH (ref 11.4–15.2)

## 2019-04-07 LAB — BASIC METABOLIC PANEL
Anion gap: 13 (ref 5–15)
BUN: 29 mg/dL — ABNORMAL HIGH (ref 6–20)
CO2: 18 mmol/L — ABNORMAL LOW (ref 22–32)
Calcium: 7.4 mg/dL — ABNORMAL LOW (ref 8.9–10.3)
Chloride: 113 mmol/L — ABNORMAL HIGH (ref 98–111)
Creatinine, Ser: 1.35 mg/dL — ABNORMAL HIGH (ref 0.61–1.24)
GFR calc Af Amer: >60 mL/min (ref >60)
GFR calc non Af Amer: 57 mL/min — ABNORMAL LOW (ref >60)
Glucose, Bld: 171 mg/dL — ABNORMAL HIGH (ref 70–99)
Potassium: 2.6 mmol/L — CL (ref 3.5–5.1)
Sodium: 144 mmol/L (ref 135–145)

## 2019-04-07 LAB — PHOSPHORUS
Phosphorus: 4.4 mg/dL (ref 2.5–4.6)
Phosphorus: 2.8 mg/dL (ref 2.5–4.6)

## 2019-04-07 LAB — GLUCOSE, CAPILLARY
Glucose-Capillary: 105 mg/dL — ABNORMAL HIGH (ref 70–99)
Glucose-Capillary: 120 mg/dL — ABNORMAL HIGH (ref 70–99)
Glucose-Capillary: 140 mg/dL — ABNORMAL HIGH (ref 70–99)
Glucose-Capillary: 165 mg/dL — ABNORMAL HIGH (ref 70–99)
Glucose-Capillary: 182 mg/dL — ABNORMAL HIGH (ref 70–99)
Glucose-Capillary: 192 mg/dL — ABNORMAL HIGH (ref 70–99)

## 2019-04-07 LAB — MAGNESIUM
Magnesium: 1.2 mg/dL — ABNORMAL LOW (ref 1.7–2.4)
Magnesium: 2.5 mg/dL — ABNORMAL HIGH (ref 1.7–2.4)

## 2019-04-07 MED ORDER — POTASSIUM CHLORIDE 20 MEQ PO PACK
40.0000 meq | PACK | ORAL | Status: AC
  Administered 2019-04-07 (×2): 40 meq via ORAL
  Filled 2019-04-07 (×2): qty 2

## 2019-04-07 MED ORDER — LACTULOSE 10 GM/15ML PO SOLN
10.0000 g | Freq: Two times a day (BID) | ORAL | Status: DC
  Administered 2019-04-07 (×2): 10 g via ORAL
  Filled 2019-04-07 (×2): qty 15

## 2019-04-07 MED ORDER — DEXTROSE-NACL 5-0.45 % IV SOLN
INTRAVENOUS | Status: DC
  Administered 2019-04-07 – 2019-04-09 (×3): via INTRAVENOUS

## 2019-04-07 MED ORDER — DEXTROSE-NACL 5-0.45 % IV SOLN
INTRAVENOUS | Status: DC
  Administered 2019-04-07: 16:00:00 via INTRAVENOUS

## 2019-04-07 MED ORDER — POTASSIUM CHLORIDE 10 MEQ/100ML IV SOLN: 10.0000 meq | INTRAVENOUS | Status: DC

## 2019-04-07 MED ORDER — POTASSIUM CHLORIDE 20 MEQ PO PACK
40.0000 meq | PACK | ORAL | Status: AC
  Administered 2019-04-07 (×2): 40 meq via ORAL
  Filled 2019-04-07 (×3): qty 2

## 2019-04-07 MED ORDER — MAGNESIUM SULFATE 2 GM/50ML IV SOLN
2.0000 g | INTRAVENOUS | Status: AC
  Administered 2019-04-07 (×3): 2 g via INTRAVENOUS
  Filled 2019-04-07 (×3): qty 50

## 2019-04-07 MED ORDER — HYDROCORTISONE ACETATE 25 MG RE SUPP
25.0000 mg | Freq: Every day | RECTAL | Status: DC
  Administered 2019-04-07 – 2019-05-21 (×21): 25 mg via RECTAL
  Filled 2019-04-07 (×52): qty 1

## 2019-04-07 MED ORDER — PANTOPRAZOLE SODIUM 40 MG PO TBEC
40.0000 mg | DELAYED_RELEASE_TABLET | Freq: Two times a day (BID) | ORAL | Status: DC
  Administered 2019-04-07 (×2): 40 mg via ORAL
  Filled 2019-04-07 (×2): qty 1

## 2019-04-07 NOTE — Progress Notes (Signed)
CRITICAL VALUE ALERT  Critical Value:  K 2.6  Date & Time Notied:  1701, 04/07/19  Provider Notified: Lonny Prude MD  Orders Received/Actions taken: Awaiting orders

## 2019-04-07 NOTE — Progress Notes (Addendum)
Daily Rounding Note  04/07/2019, 8:20 AM  LOS: 8 days   SUBJECTIVE:   Chief complaint: Hematemesis.  Esophageal varices.  Alcoholic liver disease.     Extubated yesterday.  Able to swallow water.  Flexi-Seal in place to collect abundant liquid stool in setting of 3 times daily lactulose.  Patient denies nausea, abdominal pain, difficulty swallowing.  OBJECTIVE:         Vital signs in last 24 hours:    Temp:  [97.3 F (36.3 C)-98.4 F (36.9 C)] 97.6 F (36.4 C) (09/19 0325) Pulse Rate:  [75-125] 117 (09/19 0800) Resp:  [9-25] 18 (09/19 0800) BP: (94-151)/(49-105) 130/105 (09/19 0800) SpO2:  [90 %-100 %] 100 % (09/19 0800) FiO2 (%):  [30 %-99 %] 99 % (09/18 1328) Last BM Date: 04/06/19 Filed Weights   03/30/19 1800  Weight: 88.9 kg   General: Looks ill, alert, comfortable. Heart: Regular, mildly tacky in the low 100s. Chest: Chest no labored breathing, no cough.  Voice soft/hoarse. Abdomen: Somewhat tense, nontender, protuberant.  Bowel sounds active. Extremities: No CCE. Neuro/Psych: No asterixis, very slight fine tremor in his hands..  Oriented to place, self, year.  Appropriate.  Moves all 4 limbs.  Intake/Output from previous day: 09/18 0701 - 09/19 0700 In: 591.8 [I.V.:491.8; IV Piggyback:99.9] Out: 3050 [Urine:3050]  Intake/Output this shift: Total I/O In: -  Out: 1500 [Urine:1500]  Lab Results: Recent Labs    04/05/19 0200 04/06/19 0500  WBC 5.0 11.3*  HGB 7.0* 7.9*  HCT 22.9* 26.0*  PLT 55* 74*   BMET Recent Labs    04/05/19 1724 04/06/19 0500 04/06/19 1700  NA 146* 149* 150*  K 3.8 4.3 3.6  CL 121* 124* 120*  CO2 16* 16* 15*  GLUCOSE 137* 134* 135*  BUN 31* 37* 32*  CREATININE 1.19 1.36* 1.47*  CALCIUM 6.7* 6.8* 7.0*   LFT Recent Labs    04/05/19 0200 04/06/19 0500  PROT 5.7* 6.5  ALBUMIN 2.0* 2.2*  AST 75* 94*  ALT 56* 59*  ALKPHOS 216* 253*  BILITOT 2.3* 2.8*   PT/INR  Recent Labs    04/05/19 0200 04/06/19 0500  LABPROT 18.8* 17.8*  INR 1.6* 1.5*   Hepatitis Panel No results for input(s): HEPBSAG, HCVAB, HEPAIGM, HEPBIGM in the last 72 hours.  Studies/Results: Dg Abd 1 View  Result Date: 04/06/2019 CLINICAL DATA:  Ileus. EXAM: ABDOMEN - 1 VIEW COMPARISON:  Radiograph of April 04, 2019. FINDINGS: Stable mild gaseous distention of transverse colon and cecum. No small bowel dilatation is noted. No radio-opaque calculi or other significant radiographic abnormality are seen. IMPRESSION: Stable mildly distended air-filled colon is noted. No other abnormality seen. Electronically Signed   By: Marijo Conception M.D.   On: 04/06/2019 08:22   Dg Chest Port 1 View  Result Date: 04/05/2019 CLINICAL DATA:  Follow-up mechanically assisted ventilation. EXAM: PORTABLE CHEST 1 VIEW COMPARISON:  Chest x-rays dated 04/02/2019 and 04/01/2019 FINDINGS: Support apparatus remains appropriately positioned. Stable mild central pulmonary vascular congestion. No confluent opacity to suggest a developing pneumonia or alveolar pulmonary edema. Probable small LEFT pleural effusion. No pneumothorax seen. IMPRESSION: Stable chest x-ray. No evidence of pneumonia or alveolar pulmonary edema. Probable small LEFT pleural effusion. Electronically Signed   By: Franki Cabot M.D.   On: 04/05/2019 11:57    Scheduled Meds: . sodium chloride   Intravenous Once  . chlordiazePOXIDE  10 mg Oral TID  . Chlorhexidine Gluconate Cloth  6  each Topical Daily  . folic acid  1 mg Intravenous Daily  . furosemide  40 mg Intravenous Q8H  . insulin aspart  0-15 Units Subcutaneous Q4H  . lactulose  20 g Oral TID  . mouth rinse  15 mL Mouth Rinse BID  . pantoprazole (PROTONIX) IV  40 mg Intravenous Q12H  . sodium chloride flush  10-40 mL Intracatheter Q12H  . sodium chloride flush  3 mL Intravenous Q12H  . thiamine injection  100 mg Intravenous Daily   Continuous Infusions: . sodium chloride 250 mL  (04/06/19 1827)  . cefTRIAXone (ROCEPHIN)  IV 200 mL/hr at 04/06/19 1900  . dextrose 5 % and 0.45% NaCl 100 mL/hr at 04/07/19 0803   PRN Meds:.promethazine, sodium chloride flush, sodium chloride flush   ASSESMENT:   *   Hematemesis, resolved.    9/16 EGD: grade 1 esoph varices, portal gastropathy, H Pylori negative.  Day 4 Rocephin.  Finished 72 hours octreotide.     *    Alcoholic liver dz, etoh hepatitis and likely cirrhosis LFTs increased.     *    Thrombocytopenia.  Improving.  received platelets on 9/16.  *   Tail of pancreas abnormality (pseudocyst?, underlying mass?).  Retroperitoneal adenopathy per CT.   Plan MRI when he stableizes.     *   2018 cardiac stent, no ASA or plavix PTA.    *   resp failure, met acidosis, requiring vent.  Extubated 9/18.    Ammonia 98, on lactulose.   Librium for etoh withdrawal.     *     AKI.      *    Macrocytic anemia.      PLAN   *    Finish 5 d Rocephin.  Switch to po Protonix.     *     Start diet with full liquids, advance to heart healthy/carb mod as tolerated.  *      Reduce dose of lactulose  *    B12, folate, anemia profile tomorrow.   Azucena Freed  04/07/2019, 8:20 AM Phone [phone]

## 2019-04-07 NOTE — Progress Notes (Signed)
CRITICAL VALUE ALERT  Critical Value:  K 2.6  Date & Time Notied: 04/07/2019, 0950am   Provider Notified: Lonny Prude MD   Orders Received/Actions taken: Awaiting orders

## 2019-04-07 NOTE — Progress Notes (Signed)
LUE venous duplex       has been completed. Preliminary results can be found under CV proc through chart review. Tanis Burnley, BS, RDMS, RVT   

## 2019-04-07 NOTE — Progress Notes (Addendum)
[facility]  PROGRESS NOTE  Richard Calhoun [id number] DOB: Jul 31, 1960 DOA: 03/30/2019 PCP: Charlott Rakes, MD  Brief History    Richard Calhoun is a 58 y.o. year old male with medical history significant for chronic alcohol abuse who presented on 03/30/2019 with Hematemesis over several days and initially admitted for concern for upper GI bleed causing AKI.  Hospital course complicated by worsening mentation/confusion and inability to protect airway leading to intubation on 9/13.  While encephalopathic patient was placed on octreotide and Protonix drip due to concern for upper GI bleed in setting of alcoholic hepatitis and possible underlying cirrhosis.  Underwent EGD on 9/16 which showed grade 1 esophageal varices and portal hypertensive gastropathy.   A & P  Hematemesis in the setting of grade 1 esophageal varices and portal hypertensive gastropathy.  Hemoglobin stable.  No apparent current hematemesis.   IV PPI oral, continue 5 days of Rocephin per GI  Hypernatremia, improving.  Still has water deficit, sodium 148, will start D5 half-normal, closely monitor initiation of oral diet (on full liquids).  Monitor for need for additional nutrition in in case patient needs cortrak  Left upper extremity swelling.  Differential includes superficial cellulitis versus DVT.  Will assess with venous duplex.  Hypokalemia and hypomagnesemia.  In [place] nutrition related to alcohol abuse.  Supplementing currently, closely monitor.  Macrocytic anemia, chronic. Baseline 10-11. Nadir of 7 here.  Currently 8.3, closely monitor.  Checking B12, folate, iron panel  Chronic diastolic CHF.  TTE from 01/2017 with preserved EF, grade 1 diastolic dysfunction.  Having good output status post metolazone and Lasix on 9/18, - 3 L in 24 hours.  Still has some lower extremity swelling likely some element of dependent edema, will continue to closely monitor, daily weights  Abnormal pancreatic tail incidentally  found on CT abdomen.  GI recommends MRI/MRCP once patient clinically stable/off pressors.  Metabolic acidosis, previously had anion gap metabolic acidosis.  CO2 seems to be dropping again work-up for volatile alcohol was negative, likely due to starvation ketosis due to alcohol abuse.  AKI, improved.  Peak of 3.67, due to volume depletion, improved with fluids, still has normal output.  Nephrology agrees, currently 1.47.  Holding home losartan  Alcohol abuse.  Continue Librium, folic acid, Thiamine  Alcohol hepatitis, hepatic steatosis, possible cirrhosis with hepatic encephalopathy improving.  Continue lactulose, but will reduce dose given increase in bowel movements and improvement in mental status.  Not a candidate for steroid therapy per GI  Thrombocytopenia secondary to alcohol abuse/hepatic steatosis.  Nadir of 24during admission, requiring transfusions.  Currently before (9/18), closely monitor, not actively bleeding.  CAD.  Status post DES (2018).  Currently asymptomatic.  Currently holding home aspirin.    DVT prophylaxis: SCDs Code Status: Full code Family Communication: Significant other [name] present at bedside Disposition Plan: Continue close monitoring of mental status, evaluate left arm swelling, close monitoring of electrolytes and nutritional status    [facility] Direct contact: see [email or web address] (further directions at bottom of note if needed) 7PM-7AM contact night coverage as at bottom of note 04/07/2019, 7:43 AM  LOS: 8 days   Consultants  . GI, nephrology, PCCM  Procedures  . Intubated 9/13, extubated 9/18 . EGD 9/16  Antibiotics  . Vancomycin 9/16 . Ceftriaxone 9/16-  Interval History/Subjective  Able to say hi Nurse and consultant reports patient is seeing cats and bugs on the wall   Objective   Vitals:  Vitals:   04/07/19 0600 04/07/19 0700  BP:  133/62 (!) 148/78  Pulse: 99 (!) 121  Resp: 13 16  Temp:    SpO2: 100% 100%    Exam:   Awake, somewhat somnolent but easily arousable, oriented to self, place ([facility] to [facility]) Symmetrical Chest wall movement, Good air movement bilaterally, CTAB RRR,No Gallops,Rubs or new Murmurs, 1+ pitting edema bilateral lower extremities 1+ pitting edema of left arm from elbow down to wrist Diminished bowel sounds, Abd Soft and distended, No tenderness,  No rebound - guarding or rigidity.    I have personally reviewed the following:   Data Reviewed: Basic Metabolic Panel: Recent Labs  Lab 04/04/19 1700 04/05/19 0200 04/05/19 1724 04/06/19 0500 04/06/19 1700  NA 145 145 146* 149* 150*  K 4.0 3.7 3.8 4.3 3.6  CL 117* 116* 121* 124* 120*  CO2 18* 15* 16* 16* 15*  GLUCOSE 156* 137* 137* 134* 135*  BUN 30* 29* 31* 37* 32*  CREATININE 1.16 1.23 1.19 1.36* 1.47*  CALCIUM 6.2* 6.4* 6.7* 6.8* 7.0*  MG 1.5* 1.5* 1.9 1.8 1.5*  PHOS 3.7 3.7 4.1 4.6 4.4   Liver Function Tests: Recent Labs  Lab 04/02/19 1000 04/03/19 0417 04/04/19 0440 04/05/19 0200 04/06/19 0500  AST 177* 151* 105* 75* 94*  ALT 92* 82* 71* 56* 59*  ALKPHOS 280* 262* 248* 216* 253*  BILITOT 3.8* 3.3* 2.6* 2.3* 2.8*  PROT 6.6 6.2* 6.2* 5.7* 6.5  ALBUMIN 2.5* 2.2* 2.2* 2.0* 2.2*   No results for input(s): LIPASE, AMYLASE in the last 168 hours. Recent Labs  Lab 04/01/19 1134  AMMONIA 98*   CBC: Recent Labs  Lab 04/03/19 0417 04/03/19 1240 04/04/19 0440 04/05/19 0200 04/06/19 0500  WBC 6.2 5.6 6.2 5.0 11.3*  HGB 7.7* 7.5* 7.4* 7.0* 7.9*  HCT 24.7* 23.8* 23.7* 22.9* 26.0*  MCV 121.7* 121.4* 122.2* 123.8* 127.5*  PLT 30* 36* PLATELET CLUMPS NOTED ON SMEAR, COUNT APPEARS DECREASED 55* 74*   Cardiac Enzymes: No results for input(s): CKTOTAL, CKMB, CKMBINDEX, TROPONINI in the last 168 hours. BNP (last 3 results) No results for input(s): BNP in the last 8760 hours.  ProBNP (last 3 results) No results for input(s): PROBNP in the last 8760 hours.  CBG: Recent Labs  Lab 04/06/19 1642  04/06/19 1954 04/06/19 2315 04/07/19 0313 04/07/19 0735  GLUCAP 118* 129* 107* 105* 120*    Recent Results (from the past 240 hour(s))  SARS Coronavirus 2 [facility] order, Performed in [facility] hospital lab) Nasopharyngeal Nasopharyngeal Swab     Status: None   Collection Time: 03/30/19  9:47 AM   Specimen: Nasopharyngeal Swab  Result Value Ref Range Status   SARS Coronavirus 2 NEGATIVE NEGATIVE Final    Comment: (NOTE) If result is NEGATIVE SARS-CoV-2 target nucleic acids are NOT DETECTED. The SARS-CoV-2 RNA is generally detectable in upper and lower  respiratory specimens during the acute phase of infection. The lowest  concentration of SARS-CoV-2 viral copies this assay can detect is 250  copies / mL. A negative result does not preclude SARS-CoV-2 infection  and should not be used as the sole basis for treatment or other  patient management decisions.  A negative result may occur with  improper specimen collection / handling, submission of specimen other  than nasopharyngeal swab, presence of viral mutation(s) within the  areas targeted by this assay, and inadequate number of viral copies  (<250 copies / mL). A negative result must be combined with clinical  observations, patient history, and epidemiological information. If result is POSITIVE SARS-CoV-2  target nucleic acids are DETECTED. The SARS-CoV-2 RNA is generally detectable in upper and lower  respiratory specimens dur ing the acute phase of infection.  Positive  results are indicative of active infection with SARS-CoV-2.  Clinical  correlation with patient history and other diagnostic information is  necessary to determine patient infection status.  Positive results do  not rule out bacterial infection or co-infection with other viruses. If result is PRESUMPTIVE POSTIVE SARS-CoV-2 nucleic acids MAY BE PRESENT.   A presumptive positive result was obtained on the submitted specimen  and confirmed on repeat testing.   While 2019 novel coronavirus  (SARS-CoV-2) nucleic acids may be present in the submitted sample  additional confirmatory testing may be necessary for epidemiological  and / or clinical management purposes  to differentiate between  SARS-CoV-2 and other Sarbecovirus currently known to infect humans.  If clinically indicated additional testing with an alternate test  methodology [phone]) is advised. The SARS-CoV-2 RNA is generally  detectable in upper and lower respiratory sp ecimens during the acute  phase of infection. The expected result is Negative. Fact Sheet for Patients:  StrictlyIdeas.no Fact Sheet for Healthcare Providers: BankingDealers.co.za This test is not yet approved or cleared by the [place] FDA and has been authorized for detection and/or diagnosis of SARS-CoV-2 by FDA under an Emergency Use Authorization (EUA).  This EUA will remain in effect (meaning this test can be used) for the duration of the COVID-19 declaration under Section 564(b)(1) of the Act, 21 U.S.C. section 360bbb-3(b)(1), unless the authorization is terminated or revoked sooner. Performed at [facility], [place] [address] [place], [place] [address]   MRSA PCR Screening     Status: None   Collection Time: 03/30/19  5:55 PM   Specimen: Nasal Mucosa; Nasopharyngeal  Result Value Ref Range Status   MRSA by PCR NEGATIVE NEGATIVE Final    Comment:        The GeneXpert MRSA Assay (FDA approved for NASAL specimens only), is one component of a comprehensive MRSA colonization surveillance program. It is not intended to diagnose MRSA infection nor to guide or monitor treatment for MRSA infections. Performed at [facility], [place] [address] [place], [place] [address]   Culture, blood (routine x 2)     Status: Abnormal   Collection Time: 04/02/19  8:23 PM   Specimen: BLOOD  Result Value Ref Range Status   Specimen  Description   Final    BLOOD LEFT ARM Performed at [place] [address] [place], [place] [address]    Special Requests   Final    BOTTLES DRAWN AEROBIC ONLY Blood Culture adequate volume Performed at [place] [address] [place], [place] [address]    Culture  Setup Time   Final    GRAM POSITIVE COCCI IN CLUSTERS AEROBIC BOTTLE ONLY CRITICAL RESULT CALLED TO, READ BACK BY AND VERIFIED WITH: Sheffield Slider PHARMD [id number] 04/04/19 A BROWNING    Culture (A)  Final    STAPHYLOCOCCUS SPECIES (COAGULASE NEGATIVE) THE SIGNIFICANCE OF ISOLATING THIS ORGANISM FROM A SINGLE SET OF BLOOD CULTURES WHEN MULTIPLE SETS ARE DRAWN IS UNCERTAIN. PLEASE NOTIFY THE MICROBIOLOGY DEPARTMENT WITHIN ONE WEEK IF SPECIATION AND SENSITIVITIES ARE REQUIRED. Performed at [facility], [place] [address], [place], [place] [address]    Report Status 04/05/2019 FINAL  Final  Blood Culture ID Panel (Reflexed)     Status: None   Collection Time: 04/02/19  8:23 PM  Result Value Ref Range Status   Enterococcus species NOT DETECTED NOT DETECTED Final   Listeria monocytogenes NOT DETECTED NOT DETECTED Final   Staphylococcus species NOT DETECTED NOT DETECTED Final   Staphylococcus aureus (BCID) NOT DETECTED NOT DETECTED Final   Streptococcus species NOT DETECTED NOT DETECTED Final   Streptococcus agalactiae NOT DETECTED NOT DETECTED Final   Streptococcus pneumoniae NOT DETECTED NOT DETECTED Final   Streptococcus pyogenes NOT DETECTED NOT DETECTED Final   Acinetobacter baumannii NOT DETECTED NOT DETECTED Final   Enterobacteriaceae species NOT DETECTED NOT DETECTED Final   Enterobacter cloacae complex NOT DETECTED NOT DETECTED Final   Escherichia coli NOT DETECTED NOT DETECTED Final   Klebsiella oxytoca NOT DETECTED NOT DETECTED Final   Klebsiella pneumoniae NOT DETECTED NOT DETECTED Final   Proteus species NOT DETECTED NOT DETECTED Final   Serratia marcescens NOT  DETECTED NOT DETECTED Final   Haemophilus influenzae NOT DETECTED NOT DETECTED Final   Neisseria meningitidis NOT DETECTED NOT DETECTED Final   Pseudomonas aeruginosa NOT DETECTED NOT DETECTED Final   Candida albicans NOT DETECTED NOT DETECTED Final   Candida glabrata NOT DETECTED NOT DETECTED Final   Candida krusei NOT DETECTED NOT DETECTED Final   Candida parapsilosis NOT DETECTED NOT DETECTED Final   Candida tropicalis NOT DETECTED NOT DETECTED Final    Comment: Performed at [facility], [address], [place], [place] [address]  Culture, blood (routine x 2)     Status: None (Preliminary result)   Collection Time: 04/02/19  8:25 PM   Specimen: BLOOD  Result Value Ref Range Status   Specimen Description   Final    BLOOD LEFT HAND Performed at [place] [address], [place], [place] [address]    Special Requests   Final    BOTTLES DRAWN AEROBIC ONLY Blood Culture adequate volume Performed at [place] [address] [place], [place] [address]    Culture  Setup Time AEROBIC BOTTLE ONLY GRAM POSITIVE COCCI   Final   Culture   Final    NO GROWTH 4 DAYS Performed at [facility], [place] [address] [place], [place] [address]    Report Status PENDING  Incomplete  Culture, blood (routine x 2)     Status: None (Preliminary result)   Collection Time: 04/04/19  5:51 PM   Specimen: BLOOD LEFT HAND  Result Value Ref Range Status   Specimen Description   Final    BLOOD LEFT HAND Performed at [place] [address] [place], [place] [address]    Special Requests   Final    BOTTLES DRAWN AEROBIC ONLY Blood Culture results may not be optimal due to an inadequate volume of blood received in culture bottles Performed at [place] [address] [place], [place] [address]    Culture   Final    NO GROWTH 2 DAYS Performed at [place] [address] [place], [place]  [address]    Report Status PENDING  Incomplete  Culture, blood (routine x 2)     Status: None (Preliminary result)   Collection Time: 04/04/19  5:57 PM   Specimen: BLOOD  Result Value Ref Range Status   Specimen Description   Final    BLOOD LEFT ANTECUBITAL Performed at [place] [address] [place], [place] [address]    Special Requests   Final    BOTTLES DRAWN AEROBIC ONLY Blood Culture adequate volume  Performed at [facility], [place] [address] [place], [place] [address]    Culture   Final    NO GROWTH 2 DAYS Performed at [place] [address] [place], [place] [address]    Report Status PENDING  Incomplete     Studies: Dg Abd 1 View  Result Date: 04/06/2019 CLINICAL DATA:  Ileus. EXAM: ABDOMEN - 1 VIEW COMPARISON:  Radiograph of April 04, 2019. FINDINGS: Stable mild gaseous distention of transverse colon and cecum. No small bowel dilatation is noted. No radio-opaque calculi or other significant radiographic abnormality are seen. IMPRESSION: Stable mildly distended air-filled colon is noted. No other abnormality seen. Electronically Signed   By: Marijo Conception M.D.   On: 04/06/2019 08:22   Dg Chest Port 1 View  Result Date: 04/05/2019 CLINICAL DATA:  Follow-up mechanically assisted ventilation. EXAM: PORTABLE CHEST 1 VIEW COMPARISON:  Chest x-rays dated 04/02/2019 and 04/01/2019 FINDINGS: Support apparatus remains appropriately positioned. Stable mild central pulmonary vascular congestion. No confluent opacity to suggest a developing pneumonia or alveolar pulmonary edema. Probable small LEFT pleural effusion. No pneumothorax seen. IMPRESSION: Stable chest x-ray. No evidence of pneumonia or alveolar pulmonary edema. Probable small LEFT pleural effusion. Electronically Signed   By: Franki Cabot M.D.   On: 04/05/2019 11:57    Scheduled Meds: . sodium chloride   Intravenous Once  . chlordiazePOXIDE  10 mg Oral TID  .  Chlorhexidine Gluconate Cloth  6 each Topical Daily  . folic acid  1 mg Intravenous Daily  . furosemide  40 mg Intravenous Q8H  . insulin aspart  0-15 Units Subcutaneous Q4H  . lactulose  20 g Oral TID  . mouth rinse  15 mL Mouth Rinse BID  . pantoprazole (PROTONIX) IV  40 mg Intravenous Q12H  . sodium chloride flush  10-40 mL Intracatheter Q12H  . sodium chloride flush  3 mL Intravenous Q12H  . thiamine injection  100 mg Intravenous Daily   Continuous Infusions: . sodium chloride 250 mL (04/06/19 1827)  . cefTRIAXone (ROCEPHIN)  IV 200 mL/hr at 04/06/19 1900  . dextrose 5 % and 0.45% NaCl      Active Problems:   Anxiety state   EtOH dependence (HCC)   COLONIC POLYPS, HX OF   Infected sebaceous cyst of skin   Hypertensive heart disease without heart failure   Tachycardia   Chest pain on exertion   S/P drug eluting coronary stent placement   Upper GI bleed   Acute renal failure (HCC)   Alcoholic hepatitis without ascites   Flank pain   Tachypnea   Airway intubation performed without difficulty   Encephalopathy, hepatic ([place])   Encounter for central line placement   Acute hypernatremia   Metabolic acidosis   Thrombocytopenia ([place])      Desiree Hane  [facility]

## 2019-04-08 ENCOUNTER — Inpatient Hospital Stay (HOSPITAL_COMMUNITY): Payer: Self-pay

## 2019-04-08 ENCOUNTER — Inpatient Hospital Stay: Payer: Self-pay

## 2019-04-08 DIAGNOSIS — R0600 Dyspnea, unspecified: Secondary | ICD-10-CM

## 2019-04-08 DIAGNOSIS — R0689 Other abnormalities of breathing: Secondary | ICD-10-CM

## 2019-04-08 DIAGNOSIS — R000 Tachycardia, unspecified: Secondary | ICD-10-CM

## 2019-04-08 LAB — COMPREHENSIVE METABOLIC PANEL
ALT: 50 U/L — ABNORMAL HIGH (ref 0–44)
AST: 75 U/L — ABNORMAL HIGH (ref 15–41)
Albumin: 2.1 g/dL — ABNORMAL LOW (ref 3.5–5.0)
Alkaline Phosphatase: 277 U/L — ABNORMAL HIGH (ref 38–126)
Anion gap: 11 (ref 5–15)
BUN: 26 mg/dL — ABNORMAL HIGH (ref 6–20)
CO2: 21 mmol/L — ABNORMAL LOW (ref 22–32)
Calcium: 7.4 mg/dL — ABNORMAL LOW (ref 8.9–10.3)
Chloride: 113 mmol/L — ABNORMAL HIGH (ref 98–111)
Creatinine, Ser: 1.35 mg/dL — ABNORMAL HIGH (ref 0.61–1.24)
GFR calc Af Amer: >60 mL/min (ref >60)
GFR calc non Af Amer: 57 mL/min — ABNORMAL LOW (ref >60)
Glucose, Bld: 166 mg/dL — ABNORMAL HIGH (ref 70–99)
Potassium: 2.5 mmol/L — CL (ref 3.5–5.1)
Sodium: 145 mmol/L (ref 135–145)
Total Bilirubin: 1.8 mg/dL — ABNORMAL HIGH (ref 0.3–1.2)
Total Protein: 6.3 g/dL — ABNORMAL LOW (ref 6.5–8.1)

## 2019-04-08 LAB — CULTURE, BLOOD (ROUTINE X 2): Special Requests: ADEQUATE

## 2019-04-08 LAB — IRON AND TIBC
Iron: 26 ug/dL — ABNORMAL LOW (ref 45–182)
Saturation Ratios: 24 % (ref 17.9–39.5)
TIBC: 109 ug/dL — ABNORMAL LOW (ref 250–450)
UIBC: 83 ug/dL

## 2019-04-08 LAB — CBC
HCT: 24.7 % — ABNORMAL LOW (ref 39.0–52.0)
Hemoglobin: 8 g/dL — ABNORMAL LOW (ref 13.0–17.0)
MCH: 37.4 pg — ABNORMAL HIGH (ref 26.0–34.0)
MCHC: 32.4 g/dL (ref 30.0–36.0)
MCV: 115.4 fL — ABNORMAL HIGH (ref 80.0–100.0)
Platelets: 82 10*3/uL — ABNORMAL LOW (ref 150–400)
RBC: 2.14 MIL/uL — ABNORMAL LOW (ref 4.22–5.81)
RDW: 16.6 % — ABNORMAL HIGH (ref 11.5–15.5)
WBC: 12.2 10*3/uL — ABNORMAL HIGH (ref 4.0–10.5)
nRBC: 0 % (ref 0.0–0.2)

## 2019-04-08 LAB — GLUCOSE, CAPILLARY
Glucose-Capillary: 150 mg/dL — ABNORMAL HIGH (ref 70–99)
Glucose-Capillary: 148 mg/dL — ABNORMAL HIGH (ref 70–99)
Glucose-Capillary: 141 mg/dL — ABNORMAL HIGH (ref 70–99)
Glucose-Capillary: 161 mg/dL — ABNORMAL HIGH (ref 70–99)
Glucose-Capillary: 135 mg/dL — ABNORMAL HIGH (ref 70–99)
Glucose-Capillary: 134 mg/dL — ABNORMAL HIGH (ref 70–99)

## 2019-04-08 LAB — RETICULOCYTES
Immature Retic Fract: 21.1 % — ABNORMAL HIGH (ref 2.3–15.9)
RBC.: 2.14 MIL/uL — ABNORMAL LOW (ref 4.22–5.81)
Retic Count, Absolute: 58.9 10*3/uL (ref 19.0–186.0)
Retic Ct Pct: 2.8 % (ref 0.4–3.1)

## 2019-04-08 LAB — FERRITIN: Ferritin: 841 ng/mL — ABNORMAL HIGH (ref 24–336)

## 2019-04-08 LAB — FOLATE: Folate: 13.5 ng/mL (ref >5.9)

## 2019-04-08 LAB — PHOSPHORUS
Phosphorus: 2.4 mg/dL — ABNORMAL LOW (ref 2.5–4.6)
Phosphorus: 2.3 mg/dL — ABNORMAL LOW (ref 2.5–4.6)

## 2019-04-08 LAB — BASIC METABOLIC PANEL
Anion gap: 13 (ref 5–15)
BUN: 25 mg/dL — ABNORMAL HIGH (ref 6–20)
CO2: 22 mmol/L (ref 22–32)
Calcium: 7.6 mg/dL — ABNORMAL LOW (ref 8.9–10.3)
Chloride: 109 mmol/L (ref 98–111)
Creatinine, Ser: 1.31 mg/dL — ABNORMAL HIGH (ref 0.61–1.24)
GFR calc Af Amer: >60 mL/min (ref >60)
GFR calc non Af Amer: 59 mL/min — ABNORMAL LOW (ref >60)
Glucose, Bld: 158 mg/dL — ABNORMAL HIGH (ref 70–99)
Potassium: 2.6 mmol/L — CL (ref 3.5–5.1)
Sodium: 144 mmol/L (ref 135–145)

## 2019-04-08 LAB — PROTIME-INR
INR: 1.5 — ABNORMAL HIGH (ref 0.8–1.2)
Prothrombin Time: 18 seconds — ABNORMAL HIGH (ref 11.4–15.2)

## 2019-04-08 LAB — MAGNESIUM
Magnesium: 1.9 mg/dL (ref 1.7–2.4)
Magnesium: 1.5 mg/dL — ABNORMAL LOW (ref 1.7–2.4)

## 2019-04-08 LAB — VITAMIN B12: Vitamin B-12: 2741 pg/mL — ABNORMAL HIGH (ref 180–914)

## 2019-04-08 MED ORDER — POTASSIUM CHLORIDE 20 MEQ PO PACK
40.0000 meq | PACK | Freq: Two times a day (BID) | ORAL | Status: DC
  Administered 2019-04-08: 40 meq via ORAL
  Filled 2019-04-08: qty 2

## 2019-04-08 MED ORDER — LORAZEPAM 2 MG/ML IJ SOLN: 1.0000 mg | INTRAMUSCULAR | Status: DC | PRN

## 2019-04-08 MED ORDER — POTASSIUM CHLORIDE 10 MEQ/100ML IV SOLN
10.0000 meq | INTRAVENOUS | Status: AC
  Administered 2019-04-08 – 2019-04-09 (×6): 10 meq via INTRAVENOUS
  Filled 2019-04-08 (×6): qty 100

## 2019-04-08 MED ORDER — ADULT MULTIVITAMIN W/MINERALS CH: 1.0000 | ORAL_TABLET | Freq: Every day | ORAL | Status: DC

## 2019-04-08 MED ORDER — LORAZEPAM 2 MG/ML IJ SOLN
0.0000 mg | INTRAMUSCULAR | Status: DC
  Administered 2019-04-08 – 2019-04-09 (×4): 2 mg via INTRAVENOUS
  Filled 2019-04-08 (×5): qty 1

## 2019-04-08 MED ORDER — LORAZEPAM 2 MG/ML IJ SOLN: 0.0000 mg | Freq: Three times a day (TID) | INTRAMUSCULAR | Status: DC

## 2019-04-08 MED ORDER — POTASSIUM CHLORIDE 20 MEQ PO PACK
40.0000 meq | PACK | ORAL | Status: DC
  Administered 2019-04-08: 40 meq via ORAL
  Filled 2019-04-08 (×2): qty 2

## 2019-04-08 MED ORDER — SODIUM CHLORIDE 0.9% FLUSH
10.0000 mL | Freq: Two times a day (BID) | INTRAVENOUS | Status: DC
  Administered 2019-04-08 – 2019-04-11 (×5): 10 mL
  Administered 2019-04-11: 10:00:00 20 mL
  Administered 2019-04-12 – 2019-05-02 (×19): 10 mL
  Administered 2019-05-03: 10:00:00 20 mL
  Administered 2019-05-05: 10 mL
  Administered 2019-05-05: 20 mL
  Administered 2019-05-06 – 2019-05-25 (×14): 10 mL

## 2019-04-08 MED ORDER — MAGNESIUM SULFATE 2 GM/50ML IV SOLN
2.0000 g | Freq: Once | INTRAVENOUS | Status: AC
  Administered 2019-04-08: 2 g via INTRAVENOUS
  Filled 2019-04-08: qty 50

## 2019-04-08 MED ORDER — SODIUM CHLORIDE 0.9% FLUSH
10.0000 mL | INTRAVENOUS | Status: DC | PRN
  Administered 2019-04-20 – 2019-04-30 (×3): 10 mL
  Administered 2019-05-02: 16:00:00 20 mL
  Administered 2019-05-03 – 2019-05-14 (×2): 10 mL
  Administered 2019-05-15: 20 mL
  Filled 2019-04-08 (×7): qty 40

## 2019-04-08 MED ORDER — LORAZEPAM 2 MG/ML IJ SOLN
1.0000 mg | INTRAMUSCULAR | Status: DC | PRN
  Administered 2019-04-08 (×2): 2 mg via INTRAVENOUS
  Filled 2019-04-08: qty 1

## 2019-04-08 MED ORDER — LORAZEPAM 1 MG PO TABS: 1.0000 mg | ORAL_TABLET | ORAL | Status: DC | PRN

## 2019-04-08 MED ORDER — POTASSIUM CHLORIDE 10 MEQ/100ML IV SOLN
10.0000 meq | INTRAVENOUS | Status: AC
  Administered 2019-04-08 (×6): 10 meq via INTRAVENOUS
  Filled 2019-04-08 (×6): qty 100

## 2019-04-08 MED ORDER — PANTOPRAZOLE SODIUM 40 MG IV SOLR
40.0000 mg | Freq: Two times a day (BID) | INTRAVENOUS | Status: DC
  Administered 2019-04-08 – 2019-04-11 (×6): 40 mg via INTRAVENOUS
  Filled 2019-04-08 (×6): qty 40

## 2019-04-08 MED ORDER — LACTULOSE ENEMA
300.0000 mL | Freq: Two times a day (BID) | ORAL | Status: DC
  Administered 2019-04-08 – 2019-04-10 (×4): 300 mL via RECTAL
  Filled 2019-04-08 (×6): qty 300

## 2019-04-08 MED ORDER — DEXMEDETOMIDINE HCL IN NACL 200 MCG/50ML IV SOLN
0.2000 ug/kg/h | INTRAVENOUS | Status: DC
  Administered 2019-04-08: 0.2 ug/kg/h via INTRAVENOUS
  Filled 2019-04-08: qty 50

## 2019-04-08 NOTE — Progress Notes (Signed)
PT Cancellation Note  Patient Details Name: Richard Calhoun MRN: [id number] DOB: 08-07-1960   Cancelled Treatment:     PT order received but eval deferred this date at request of RN - pt confused and hallucinating 2* ETOH withdrawal.  Will follow.  Debe Coder PT Acute Rehabilitation Services Pager [phone] Office [phone]    [facility] 04/08/2019, 8:19 AM

## 2019-04-08 NOTE — Progress Notes (Signed)
Patient is sweating, having tremors, visual and auditory hallucinations, and severe anxiety. Pt HR is also 120s-140s sustained. RN paged [facility]. [facility] ordered precedex. RN will continue to monitor.

## 2019-04-08 NOTE — Progress Notes (Signed)
Peripherally Inserted Central Catheter/Midline Placement  The IV Nurse has discussed with the patient and/or persons authorized to consent for the patient, the purpose of this procedure and the potential benefits and risks involved with this procedure.  The benefits include less needle sticks, lab draws from the catheter, and the patient may be discharged home with the catheter. Risks include, but not limited to, infection, bleeding, blood clot (thrombus formation), and puncture of an artery; nerve damage and irregular heartbeat and possibility to perform a PICC exchange if needed/ordered by physician.  Alternatives to this procedure were also discussed.  Bard Power PICC patient education guide, fact sheet on infection prevention and patient information card has been provided to patient /or left at bedside.  Phone consent obtain from sister [facility]   PICC/Midline Placement Documentation  PICC Double Lumen [id number] PICC Right Basilic 39 cm 0 cm (Active)  Indication for Insertion or Continuance of Line Prolonged intravenous therapies 04/08/19 1234  Exposed Catheter (cm) 0 cm 04/08/19 1234  Site Assessment Clean;Dry;Intact 04/08/19 1234  Lumen #1 Status Flushed;Saline locked;Blood return noted 04/08/19 1234  Lumen #2 Status Flushed;Saline locked;Blood return noted 04/08/19 1234  Dressing Type Transparent 04/08/19 1234  Dressing Status Clean;Dry;Intact;Antimicrobial disc in place 04/08/19 1234  Dressing Change Due 04/15/19 04/08/19 1234       Gordan Payment 04/08/2019, 12:36 PM

## 2019-04-08 NOTE — Progress Notes (Signed)
CRITICAL VALUE ALERT  Critical Value:  Potassium at 2.5  Date & Time Notied: 04/08/2019 0500am  Provider Notified: [facility] paged  Orders Received/Actions taken: Waiting for orders; RN will continue to monitor

## 2019-04-08 NOTE — Progress Notes (Signed)
CRITICAL VALUE ALERT  Critical Value: K 2.6  Date & Time Notied:  1635, 04/08/19  Provider Notified: Lisbeth Ply MD   Orders Received/Actions taken: Awaiting orders

## 2019-04-08 NOTE — Progress Notes (Addendum)
[facility]  PROGRESS NOTE  Richard Calhoun QMG:[id number] DOB: 11-04-1960 DOA: 03/30/2019 PCP: Richard Rakes, MD  Brief History    Richard Calhoun is a 58 y.o. year old male with medical history significant for chronic alcohol abuse, CAD s/p DES ( 2018), CHFpEF, HLD,  who presented on 03/30/2019 reports of worsening left flank pain, unwitnessed fall, nausea and vomiting for several days and episode of hematemesis  and initially admitted for concern for upper GI bleed causing AKI.    ED Course:  T-max 97.9 SPO2 95% on room air, HR 114, BP 143/71  Hemoglobin 8.5, WBC 8.9, platelets 39 INR 1.3 K5.2, CO2 12, creatinine 3.67, BUN 72, albumin 3.1  COVID test negative Lipase 64  CT chest abdomen for evaluation of reported fall with left upper flank pain: Unusual appearance of pancreatic tail, concern for complex pancreatic pseudocyst related to pancreatitis, however underlying mass not excluded, otherwise no significant acute traumatic injury to chest abdomen or pelvis, nonspecific retroperitoneal lymph node enlargement, atelectasis of right lower lobe, hepatomegaly with severe hepatic steatosis.  Patient received IV Ativan, IV thiamine, Librium, IV octreotide and was admitted to [facility] for further management  Hospital course complicated by worsening mentation/confusion and worsening tachypnea did not respond to BiPAP due to inability to protect airway was intubated on 9/13.  While encephalopathic patient was placed on octreotide and Protonix drip due to concern for upper GI bleed in setting of alcoholic hepatitis and possible underlying cirrhosis.  Patient is also received scheduled lactulose for management of hepatic encephalopathy renal was consulted due to high anion gap metabolic acidosis with AKI.  Patient received omeprazole due to concern for toxic alcohol ingestion given osmolar gap mild alcohol work-up was negative and presumed acidosis/AKI secondary to starvation  ketosis due to alcohol abuse with prerenal injury that improved with IV fluids.  Underwent EGD on 9/16 which showed grade 1 esophageal varices and portal hypertensive gastropathy.  Patient was extubated on 9/18 and transferred from [facility] to [facility] for further management  Overnight on 9/19 patient found to be more agitated, tachycardic, tachypneic, and hypertensive. Was started on precedex gtt shortly due to concern for EtOH withdrawal. That was then changed to scheduled ativan according to CIWA protocol with close monitoring in step down unit.      A & P   Alcohol abuse, seems to be active withdrawal despite being in hospital for 10 days.  Overnight had episodes of worsening hallucinations, agitation, tachycardia, hypotensive episode.  Initially placed on Precedex early this a.m.  Instead placed  patient on CIWA protocol with scheduled IV Ativan and PRN with improvement in hemodynamics, not on any other sedatives that could be contributing,  close monitoring of mental status/electrolytes in case need to escalate to Precedex again.  Continue Librium, folic acid, Thiamine  Alcohol hepatitis, hepatic steatosis, possible cirrhosis with hepatic encephalopathy, worsening Mental status started worsening (oriented to self, hallucinations), no stool output in last 24 hours after reduction of lactulose in last 24 hours. Lactulose by enema and titrate to BMs.   closely monitor in setting what seems to be active alcohol withdrawal not a candidate for steroid therapy per GI given moderate severity of hepatitis ( PT 15.8, Bilirubin 1.8), ALT/AST downtrending, alk phos remains elevated, repeat CMP in am.  Hypokalemia and hypomagnesemia.   nutrition related to alcohol abuse, likely acutely worsened in setting of what seems to be active DTs/alcohol withdrawal.  Supplementing currently, closely monitor.  Hematemesis episode x1  in the setting of grade 1 esophageal varices and portal  hypertensive gastropathy.  Hemoglobin stable.  No recurrent hematemesis in hospital.  Transition back to IV PPI twice daily given patient is n.p.o. with change in mental status.    5 days of Rocephin per GI  Hypernatremia, improving.  Still has water deficit, sodium 145, continue D5 half-normal, closely monitor since currently NPO due to waxing mental status in what seems to be EtOH withdrawal.   Monitor for need for additional nutrition in in case patient needs cortrak  Left upper extremity swelling.  Venous duplex negative for  DVT  Macrocytic anemia, chronic. Baseline 10-11. Nadir of 7 here.  Currently 8.3, closely monitor.  Checking B12, folate, iron panel  Chronic diastolic CHF, stable.  TTE from 01/2017 with preserved EF, grade 1 diastolic dysfunction. Had 5 L out in last 24 hours and now with worse mentation not able to eat and looks volume down will dc further diuresis. CXR looks good with no volume overload.  Still has some lower extremity swelling likely some element of dependent edema, will continue to closely monitor, daily weights  Abnormal pancreatic tail incidentally found on CT abdomen.  GI recommends MRI/MRCP once patient clinically stable to be able to hold breath for procedure  Metabolic acidosis, previously had anion gap metabolic acidosis, resolved.   work-up for volatile alcohol was negative, likely due to starvation ketosis due to alcohol abuse.  AKI, improving.  Peak of 3.67, due to volume depletion, improved with fluids, still has normal output.  Nephrology agrees, currently 1.35.  Holding home losartan  Thrombocytopenia secondary to alcohol abuse/hepatic steatosis, stable.  Nadir of 24 during admission, requiring transfusions, currently 82.  , closely monitor, not actively bleeding.  CAD.  Status post DES (2018).  Currently asymptomatic.  Currently holding home aspirin.    DVT prophylaxis: SCDs Code Status: Full code Family Communication: Updated significant other  Richard Calhoun by phone on 9/20 Disposition Plan: Continue close monitoring of mental status, on scheduled ativan/CIWA protocol for what seems to be active DTs, increase lactulose for worsening hepatic encephalopathy, close monitoring of electrolytes and nutritional status    [facility] Direct contact: see [email or web address] (further directions at bottom of note if needed) 7PM-7AM contact night coverage as at bottom of note 04/08/2019, 3:17 PM  LOS: 9 days   Consultants   GI, nephrology, PCCM  Procedures   Intubated 9/13, extubated 9/18  EGD 9/16  Antibiotics   Vancomycin 9/16  Ceftriaxone 9/16-  Interval History/Subjective   Overnight had episodes of worsening concentration, agitation, tachycardia, hypotensive episode.  Initially placed on Precedex early this a.m.     Objective   Vitals:  Vitals:   04/08/19 1400 04/08/19 1500  BP: (!) 153/73 (!) 151/70  Pulse: (!) 108 (!) 102  Resp: (!) 23 (!) 23  Temp:    SpO2: 99% 98%    Exam:  Awake, somewhat somnolent but easily arousable, oriented to self only, difficult to understand due to gurgling breathing), does not answer any other orientation questions, waves at me with hand mitten on Symmetrical Chest wall movement, on room air, no wheezing or rales, gurgling seems to be in upper airway RRR,No Gallops,Rubs or new Murmurs, 1+ pitting edema bilateral lower extremities 1+ pitting edema of left arm from elbow down to wrist Diminished bowel sounds, Abd Soft and distended, No tenderness,  No rebound - guarding or rigidity.    I have personally reviewed the following:   Data Reviewed: Basic Metabolic Panel: Recent  Labs  Lab 04/06/19 0500 04/06/19 1700 04/07/19 0500 04/07/19 1630 04/08/19 0330  NA 149* 150* 148* 144 145  K 4.3 3.6 2.6* 2.6* 2.5*  CL 124* 120* 114* 113* 113*  CO2 16* 15* 16* 18* 21*  GLUCOSE 134* 135* 160* 171* 166*  BUN 37* 32* 31* 29* 26*  CREATININE 1.36* 1.47* 1.44* 1.35* 1.35*  CALCIUM 6.8* 7.0*  7.3* 7.4* 7.4*  MG 1.8 1.5* 1.2* 2.5* 1.9  PHOS 4.6 4.4 4.4 2.8 2.4*   Liver Function Tests: Recent Labs  Lab 04/04/19 0440 04/05/19 0200 04/06/19 0500 04/07/19 0500 04/08/19 0330  AST 105* 75* 94* 84* 75*  ALT 71* 56* 59* 56* 50*  ALKPHOS 248* 216* 253* 275* 277*  BILITOT 2.6* 2.3* 2.8* 2.5* 1.8*  PROT 6.2* 5.7* 6.5 6.6 6.3*  ALBUMIN 2.2* 2.0* 2.2* 2.3* 2.1*   No results for input(s): LIPASE, AMYLASE in the last 168 hours. No results for input(s): AMMONIA in the last 168 hours. CBC: Recent Labs  Lab 04/04/19 0440 04/05/19 0200 04/06/19 0500 04/07/19 0500 04/08/19 0330  WBC 6.2 5.0 11.3* 10.6* 12.2*  HGB 7.4* 7.0* 7.9* 8.3* 8.0*  HCT 23.7* 22.9* 26.0* 26.4* 24.7*  MCV 122.2* 123.8* 127.5* 120.5* 115.4*  PLT PLATELET CLUMPS NOTED ON SMEAR, COUNT APPEARS DECREASED 55* 74* PLATELET CLUMPS NOTED ON SMEAR, UNABLE TO ESTIMATE 82*   Cardiac Enzymes: No results for input(s): CKTOTAL, CKMB, CKMBINDEX, TROPONINI in the last 168 hours. BNP (last 3 results) No results for input(s): BNP in the last 8760 hours.  ProBNP (last 3 results) No results for input(s): PROBNP in the last 8760 hours.  CBG: Recent Labs  Lab 04/07/19 1942 04/07/19 2341 04/08/19 0653 04/08/19 0736 04/08/19 1118  GLUCAP 140* 182* 150* 148* 141*    Recent Results (from the past 240 hour(s))  SARS Coronavirus 2 [facility] order, Performed in [facility] hospital lab) Nasopharyngeal Nasopharyngeal Swab     Status: None   Collection Time: 03/30/19  9:47 AM   Specimen: Nasopharyngeal Swab  Result Value Ref Range Status   SARS Coronavirus 2 NEGATIVE NEGATIVE Final    Comment: (NOTE) If result is NEGATIVE SARS-CoV-2 target nucleic acids are NOT DETECTED. The SARS-CoV-2 RNA is generally detectable in upper and lower  respiratory specimens during the acute phase of infection. The lowest  concentration of SARS-CoV-2 viral copies this assay can detect is 250  copies / mL. A negative result does not preclude  SARS-CoV-2 infection  and should not be used as the sole basis for treatment or other  patient management decisions.  A negative result may occur with  improper specimen collection / handling, submission of specimen other  than nasopharyngeal swab, presence of viral mutation(s) within the  areas targeted by this assay, and inadequate number of viral copies  (<250 copies / mL). A negative result must be combined with clinical  observations, patient history, and epidemiological information. If result is POSITIVE SARS-CoV-2 target nucleic acids are DETECTED. The SARS-CoV-2 RNA is generally detectable in upper and lower  respiratory specimens dur ing the acute phase of infection.  Positive  results are indicative of active infection with SARS-CoV-2.  Clinical  correlation with patient history and other diagnostic information is  necessary to determine patient infection status.  Positive results do  not rule out bacterial infection or co-infection with other viruses. If result is PRESUMPTIVE POSTIVE SARS-CoV-2 nucleic acids MAY BE PRESENT.   A presumptive positive result was obtained on the submitted specimen  and confirmed on  repeat testing.  While 2019 novel coronavirus  (SARS-CoV-2) nucleic acids may be present in the submitted sample  additional confirmatory testing may be necessary for epidemiological  and / or clinical management purposes  to differentiate between  SARS-CoV-2 and other Sarbecovirus currently known to infect humans.  If clinically indicated additional testing with an alternate test  methodology [phone]) is advised. The SARS-CoV-2 RNA is generally  detectable in upper and lower respiratory sp ecimens during the acute  phase of infection. The expected result is Negative. Fact Sheet for Patients:  StrictlyIdeas.no Fact Sheet for Healthcare Providers: BankingDealers.co.za This test is not yet approved or cleared by the  [place] FDA and has been authorized for detection and/or diagnosis of SARS-CoV-2 by FDA under an Emergency Use Authorization (EUA).  This EUA will remain in effect (meaning this test can be used) for the duration of the COVID-19 declaration under Section 564(b)(1) of the Act, 21 U.S.C. section 360bbb-3(b)(1), unless the authorization is terminated or revoked sooner. Performed at [facility], [place] [address] [place], [place] [address]   MRSA PCR Screening     Status: None   Collection Time: 03/30/19  5:55 PM   Specimen: Nasal Mucosa; Nasopharyngeal  Result Value Ref Range Status   MRSA by PCR NEGATIVE NEGATIVE Final    Comment:        The GeneXpert MRSA Assay (FDA approved for NASAL specimens only), is one component of a comprehensive MRSA colonization surveillance program. It is not intended to diagnose MRSA infection nor to guide or monitor treatment for MRSA infections. Performed at [facility], [place] [address], [place], [place] [address]   Culture, blood (routine x 2)     Status: Abnormal   Collection Time: 04/02/19  8:23 PM   Specimen: BLOOD  Result Value Ref Range Status   Specimen Description   Final    BLOOD LEFT ARM Performed at [facility] [address], [place], [place] [address]    Special Requests   Final    BOTTLES DRAWN AEROBIC ONLY Blood Culture adequate volume Performed at [facility] [address], [place], [place] [address]    Culture  Setup Time   Final    GRAM POSITIVE COCCI IN CLUSTERS AEROBIC BOTTLE ONLY CRITICAL RESULT CALLED TO, READ BACK BY AND VERIFIED WITH: Sheffield Slider PHARMD [id number] 04/04/19 A BROWNING    Culture (A)  Final    STAPHYLOCOCCUS SPECIES (COAGULASE NEGATIVE) THE SIGNIFICANCE OF ISOLATING THIS ORGANISM FROM A SINGLE SET OF BLOOD CULTURES WHEN MULTIPLE SETS ARE DRAWN IS UNCERTAIN. PLEASE NOTIFY THE MICROBIOLOGY DEPARTMENT WITHIN ONE WEEK IF  SPECIATION AND SENSITIVITIES ARE REQUIRED. Performed at [facility], [place] [address] [place], [place] [address]    Report Status 04/05/2019 FINAL  Final  Blood Culture ID Panel (Reflexed)     Status: None   Collection Time: 04/02/19  8:23 PM  Result Value Ref Range Status   Enterococcus species NOT DETECTED NOT DETECTED Final   Listeria monocytogenes NOT DETECTED NOT DETECTED Final   Staphylococcus species NOT DETECTED NOT DETECTED Final   Staphylococcus aureus (BCID) NOT DETECTED NOT DETECTED Final   Streptococcus species NOT DETECTED NOT DETECTED Final   Streptococcus agalactiae NOT DETECTED NOT DETECTED Final   Streptococcus pneumoniae NOT DETECTED NOT DETECTED Final   Streptococcus pyogenes NOT DETECTED NOT DETECTED Final   Acinetobacter baumannii NOT DETECTED NOT DETECTED Final   Enterobacteriaceae species NOT DETECTED NOT DETECTED Final  Enterobacter cloacae complex NOT DETECTED NOT DETECTED Final   Escherichia coli NOT DETECTED NOT DETECTED Final   Klebsiella oxytoca NOT DETECTED NOT DETECTED Final   Klebsiella pneumoniae NOT DETECTED NOT DETECTED Final   Proteus species NOT DETECTED NOT DETECTED Final   Serratia marcescens NOT DETECTED NOT DETECTED Final   Haemophilus influenzae NOT DETECTED NOT DETECTED Final   Neisseria meningitidis NOT DETECTED NOT DETECTED Final   Pseudomonas aeruginosa NOT DETECTED NOT DETECTED Final   Candida albicans NOT DETECTED NOT DETECTED Final   Candida glabrata NOT DETECTED NOT DETECTED Final   Candida krusei NOT DETECTED NOT DETECTED Final   Candida parapsilosis NOT DETECTED NOT DETECTED Final   Candida tropicalis NOT DETECTED NOT DETECTED Final    Comment: Performed at [facility], [place] [address], [place], [place] [address]  Culture, blood (routine x 2)     Status: Abnormal   Collection Time: 04/02/19  8:25 PM   Specimen: BLOOD  Result Value Ref Range Status   Specimen Description   Final    BLOOD LEFT HAND Performed at  [place] [address], [place], [place] [address]    Special Requests   Final    BOTTLES DRAWN AEROBIC ONLY Blood Culture adequate volume Performed at [place] [address] [place], [place] [address]    Culture  Setup Time   Final    AEROBIC BOTTLE ONLY GRAM POSITIVE COCCI CRITICAL VALUE NOTED.  VALUE IS CONSISTENT WITH PREVIOUSLY REPORTED AND CALLED VALUE.    Culture (A)  Final    STAPHYLOCOCCUS AURICULARIS THE SIGNIFICANCE OF ISOLATING THIS ORGANISM FROM A SINGLE SET OF BLOOD CULTURES WHEN MULTIPLE SETS ARE DRAWN IS UNCERTAIN. PLEASE NOTIFY THE MICROBIOLOGY DEPARTMENT WITHIN ONE WEEK IF SPECIATION AND SENSITIVITIES ARE REQUIRED. Performed at [facility], [place] [address] [place], [place] [address]    Report Status 04/08/2019 FINAL  Final  Culture, blood (routine x 2)     Status: None (Preliminary result)   Collection Time: 04/04/19  5:51 PM   Specimen: BLOOD LEFT HAND  Result Value Ref Range Status   Specimen Description   Final    BLOOD LEFT HAND Performed at [place] [address], [place], [place] [address]    Special Requests   Final    BOTTLES DRAWN AEROBIC ONLY Blood Culture results may not be optimal due to an inadequate volume of blood received in culture bottles Performed at [place] [address], [place], [place] [address]    Culture   Final    NO GROWTH 3 DAYS Performed at [facility], [place] [address], [place], [place] [address]    Report Status PENDING  Incomplete  Culture, blood (routine x 2)     Status: None (Preliminary result)   Collection Time: 04/04/19  5:57 PM   Specimen: BLOOD  Result Value Ref Range Status   Specimen Description   Final    BLOOD LEFT ANTECUBITAL Performed at [place] [address] [place], [place] [address]    Special Requests   Final    BOTTLES DRAWN AEROBIC ONLY Blood Culture adequate  volume Performed at [place] [address] [place], [place] [address]    Culture   Final    NO GROWTH 3 DAYS Performed at [facility], [place] [address], [place], [place] [address]    Report Status PENDING  Incomplete  Studies: Dg Chest Port 1 View  Result Date: 04/08/2019 CLINICAL DATA:  Post extubation.  Follow-up exam. EXAM: PORTABLE CHEST 1 VIEW COMPARISON:  04/05/2019 and older studies. FINDINGS: Endotracheal tube and nasal/orogastric tube have been removed. Right internal jugular central venous line is stable. Lungs are clear.  Heart is normal size. No pleural effusion or pneumothorax IMPRESSION: 1. No active cardiopulmonary disease. 2. Status post extubation and removal of the nasogastric tube. Electronically Signed   By: Lajean Manes M.D.   On: 04/08/2019 08:59   Vas [place] Upper Extremity Venous Duplex  Result Date: 04/08/2019 UPPER VENOUS STUDY  Indications: Swelling Limitations: Poor patient position and poor ultrasound/tissue interface. Comparison Study: 04/21/17 negative Performing Technologist: June Leap RDMS, RVT  Examination Guidelines: A complete evaluation includes B-mode imaging, spectral Doppler, color Doppler, and power Doppler as needed of all accessible portions of each vessel. Bilateral testing is considered an integral part of a complete examination. Limited examinations for reoccurring indications may be performed as noted.  Right Findings: +-----+------------+---------+-----------+----------+--------------+  RIGHT Compressible Phasicity Spontaneous Properties    Summary      +-----+------------+---------+-----------+----------+--------------+  IJV                                                 Not visualized  +-----+------------+---------+-----------+----------+--------------+  Left Findings: +----------+------------+---------+-----------+----------+-------+  LEFT       Compressible Phasicity Spontaneous Properties Summary   +----------+------------+---------+-----------+----------+-------+  IJV                        Yes        Yes                         +----------+------------+---------+-----------+----------+-------+  Subclavian     Full        Yes        Yes                         +----------+------------+---------+-----------+----------+-------+  Axillary       Full        Yes        Yes                         +----------+------------+---------+-----------+----------+-------+  Brachial       Full        Yes        Yes                         +----------+------------+---------+-----------+----------+-------+  Radial         Full                                               +----------+------------+---------+-----------+----------+-------+  Ulnar          Full                                               +----------+------------+---------+-----------+----------+-------+  Cephalic       Full                                               +----------+------------+---------+-----------+----------+-------+  Basilic        Full                                               +----------+------------+---------+-----------+----------+-------+  Summary:  Left: No evidence of deep vein thrombosis in the upper extremity. No evidence of superficial vein thrombosis in the upper extremity.  *See table(s) above for measurements and observations.  Diagnosing physician: Ruta Hinds MD Electronically signed by Ruta Hinds MD on 04/08/2019 at 11:00:12 AM.    Final    [place] Ekg Site Rite  Result Date: 04/08/2019 If Site Rite image not attached, placement could not be confirmed due to current cardiac rhythm.   Scheduled Meds:  sodium chloride   Intravenous Once   Chlorhexidine Gluconate Cloth  6 each Topical Daily   folic acid  1 mg Intravenous Daily   furosemide  40 mg Intravenous Q8H   hydrocortisone  25 mg Rectal QHS   insulin aspart  0-15 Units Subcutaneous Q4H   lactulose  10 g Oral BID   LORazepam  0-4 mg Intravenous  Q4H   Followed by   Derrill Memo ON 04/10/2019] LORazepam  0-4 mg Intravenous Q8H   mouth rinse  15 mL Mouth Rinse BID   multivitamin with minerals  1 tablet Oral Daily   pantoprazole  40 mg Oral BID   sodium chloride flush  10-40 mL Intracatheter Q12H   sodium chloride flush  10-40 mL Intracatheter Q12H   sodium chloride flush  3 mL Intravenous Q12H   thiamine injection  100 mg Intravenous Daily   Continuous Infusions:  sodium chloride 250 mL (04/06/19 1827)   cefTRIAXone (ROCEPHIN)  IV Stopped (04/07/19 1741)   dextrose 5 % and 0.45% NaCl 100 mL/hr at 04/08/19 1503   potassium chloride Stopped (04/08/19 1543)    Active Problems:   Anxiety state   EtOH dependence ([place])   COLONIC POLYPS, HX OF   Infected sebaceous cyst of skin   Hypertensive heart disease without heart failure   Tachycardia   Chest pain on exertion   S/P drug eluting coronary stent placement   Upper GI bleed   Acute renal failure (HCC)   Alcoholic hepatitis without ascites   Flank pain   Tachypnea   Airway intubation performed without difficulty   Encephalopathy, hepatic (HCC)   Encounter for central line placement   Acute hypernatremia   Metabolic acidosis   Thrombocytopenia (HCC)   Left arm swelling   Hypomagnesemia   Chronic diastolic CHF (congestive heart failure) ([place])   Macrocytic anemia      Waldron Session Berry Godsey  [facility]

## 2019-04-08 NOTE — Progress Notes (Signed)
Patient needing PICC line due to central line being in 6 days. Paged S. Nettey MD about this.

## 2019-04-08 NOTE — Progress Notes (Signed)
Pt switched from ST to afib; HR 110-130s. Pt is currently receiving runs of potassium for a level of 2.6. MD notified, and new orders received to complete an EKG and hang magnesium (mag 1.5). Will continue to monitor pt and response to interventions.

## 2019-04-08 NOTE — Progress Notes (Addendum)
        Daily Rounding Note  04/08/2019, 12:25 PM  LOS: 9 days   SUBJECTIVE:   Chief complaint: Hematemesis, nonbleeding esophageal varices.  Alcoholic liver disease. 600 mL's of stool recorded into the rectal pouch yesterday.  None recorded yet today. Last night was having tremors, visual and auditory hallucinations, severe anxiety with heart rate into the 120s-140s. Not sure if he ever received full liquids yesterday as ordered, currently n.p.o. as of 8 AM today to allow for PICC line placement?  OBJECTIVE:         Vital signs in last 24 hours:    Temp:  [97.5 F (36.4 C)-98.5 F (36.9 C)] 97.5 F (36.4 C) (09/20 0800) Pulse Rate:  [95-130] 95 (09/20 1100) Resp:  [16-28] 22 (09/20 1100) BP: (102-164)/(57-115) 151/68 (09/20 1100) SpO2:  [95 %-100 %] 98 % (09/20 1100) Last BM Date: 04/06/19 Filed Weights   03/30/19 1800  Weight: 88.9 kg   PICC line getting placed, not able to enter the room due to sterile procedure in progress. Will to examine him just now.  He is obtunded and not responding to my exam or voice.  I did not make vigorous attempts to awaken him. Abdomen slightly distended, not tender.  Bowel sounds active. Heart rate in the low 100s, regular. Has a mucoid sound emanating from his throat and wet cough. No CCE.   Intake/Output from previous day: 09/19 0701 - 09/20 0700 In: 1049.3 [I.V.:901; IV Piggyback:148.3] Out: 5250 [Urine:4650; Stool:600]  Intake/Output this shift: Total I/O In: 1771.9 [I.V.:1443.5; IV Piggyback:328.4] Out: -   Lab Results: Recent Labs    04/06/19 0500 04/07/19 0500 04/08/19 0330  WBC 11.3* 10.6* 12.2*  HGB 7.9* 8.3* 8.0*  HCT 26.0* 26.4* 24.7*  PLT 74* PLATELET CLUMPS NOTED ON SMEAR, UNABLE TO ESTIMATE 82*   BMET Recent Labs    04/07/19 0500 04/07/19 1630 04/08/19 0330  NA 148* 144 145  K 2.6* 2.6* 2.5*  CL 114* 113* 113*  CO2 16* 18* 21*  GLUCOSE 160* 171* 166*   BUN 31* 29* 26*  CREATININE 1.44* 1.35* 1.35*  CALCIUM 7.3* 7.4* 7.4*   LFT Recent Labs    04/06/19 0500 04/07/19 0500 04/08/19 0330  PROT 6.5 6.6 6.3*  ALBUMIN 2.2* 2.3* 2.1*  AST 94* 84* 75*  ALT 59* 56* 50*  ALKPHOS 253* 275* 277*  BILITOT 2.8* 2.5* 1.8*   PT/INR Recent Labs    04/07/19 0500 04/08/19 0330  LABPROT 18.3* 18.0*  INR 1.5* 1.5*   Hepatitis Panel No results for input(s): HEPBSAG, HCVAB, HEPAIGM, HEPBIGM in the last 72 hours.  Studies/Results: Dg Chest Port 1 View  Result Date: 04/08/2019 CLINICAL DATA:  Post extubation.  Follow-up exam. EXAM: PORTABLE CHEST 1 VIEW COMPARISON:  04/05/2019 and older studies. FINDINGS: Endotracheal tube and nasal/orogastric tube have been removed. Right internal jugular central venous line is stable. Lungs are clear.  Heart is normal size. No pleural effusion or pneumothorax IMPRESSION: 1. No active cardiopulmonary disease. 2. Status post extubation and removal of the nasogastric tube. Electronically Signed   By: David  Ormond M.D.   On: 04/08/2019 08:59   Vas Us Upper Extremity Venous Duplex  Result Date: 04/08/2019 UPPER VENOUS STUDY Summary:  Left: No evidence of deep vein thrombosis in the upper extremity. No evidence of superficial vein thrombosis in the upper extremity.  *See table(s) above for measurements and observations.  Diagnosing physician: Charles Fields MD Electronically signed by Charles Fields MD on   04/08/2019 at 11:00:12 AM.    Final    [place] Ekg Site Rite  Result Date: 04/08/2019 If Site Rite image not attached, placement could not be confirmed due to current cardiac rhythm.   ASSESMENT:   *   Hematemesis, resolved.    9/16 EGD: grade 1 esoph varices, portal gastropathy, H Pylori negative.  Day 5 Rocephin.  Finished 72 hours octreotide.     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     *    Thrombocytopenia.  Improving.  received platelets on 9/16.  *   Tail of pancreas abnormality (pseudocyst?, underlying mass?).  Retroperitoneal adenopathy per CT.   Plan MRI when he stableizes.     *   2018 cardiac stent, no ASA or plavix PTA.    *   resp failure, met acidosis, requiring vent.  Extubated 9/18.    Ammonia 98, on lactulose.   Librium for etoh withdrawal.     *     AKI.      *    Hypokalemia.  *    Macrocytic anemia.    B12 elevated, folate normal.  Low iron and TIBC, ferritin elevated.   PLAN   *    Continue supportive care.    Azucena Freed  04/08/2019, 12:25 PM Phone [phone]

## 2019-04-09 ENCOUNTER — Inpatient Hospital Stay (HOSPITAL_COMMUNITY): Payer: Self-pay

## 2019-04-09 DIAGNOSIS — I4891 Unspecified atrial fibrillation: Secondary | ICD-10-CM

## 2019-04-09 LAB — PHOSPHORUS
Phosphorus: 2.1 mg/dL — ABNORMAL LOW (ref 2.5–4.6)
Phosphorus: 2.6 mg/dL (ref 2.5–4.6)

## 2019-04-09 LAB — MAGNESIUM
Magnesium: 1.5 mg/dL — ABNORMAL LOW (ref 1.7–2.4)
Magnesium: 2.5 mg/dL — ABNORMAL HIGH (ref 1.7–2.4)

## 2019-04-09 LAB — COMPREHENSIVE METABOLIC PANEL
ALT: 38 U/L (ref 0–44)
AST: 55 U/L — ABNORMAL HIGH (ref 15–41)
Albumin: 2 g/dL — ABNORMAL LOW (ref 3.5–5.0)
Alkaline Phosphatase: 220 U/L — ABNORMAL HIGH (ref 38–126)
Anion gap: 11 (ref 5–15)
BUN: 22 mg/dL — ABNORMAL HIGH (ref 6–20)
CO2: 21 mmol/L — ABNORMAL LOW (ref 22–32)
Calcium: 7 mg/dL — ABNORMAL LOW (ref 8.9–10.3)
Chloride: 108 mmol/L (ref 98–111)
Creatinine, Ser: 1.24 mg/dL (ref 0.61–1.24)
GFR calc Af Amer: >60 mL/min (ref >60)
GFR calc non Af Amer: >60 mL/min (ref >60)
Glucose, Bld: 443 mg/dL — ABNORMAL HIGH (ref 70–99)
Potassium: 2.3 mmol/L — CL (ref 3.5–5.1)
Sodium: 140 mmol/L (ref 135–145)
Total Bilirubin: 1.4 mg/dL — ABNORMAL HIGH (ref 0.3–1.2)
Total Protein: 5.8 g/dL — ABNORMAL LOW (ref 6.5–8.1)

## 2019-04-09 LAB — GLUCOSE, CAPILLARY
Glucose-Capillary: 123 mg/dL — ABNORMAL HIGH (ref 70–99)
Glucose-Capillary: 140 mg/dL — ABNORMAL HIGH (ref 70–99)
Glucose-Capillary: 142 mg/dL — ABNORMAL HIGH (ref 70–99)
Glucose-Capillary: 144 mg/dL — ABNORMAL HIGH (ref 70–99)
Glucose-Capillary: 148 mg/dL — ABNORMAL HIGH (ref 70–99)
Glucose-Capillary: 154 mg/dL — ABNORMAL HIGH (ref 70–99)
Glucose-Capillary: 165 mg/dL — ABNORMAL HIGH (ref 70–99)

## 2019-04-09 LAB — BASIC METABOLIC PANEL
Anion gap: 11 (ref 5–15)
BUN: 24 mg/dL — ABNORMAL HIGH (ref 6–20)
CO2: 21 mmol/L — ABNORMAL LOW (ref 22–32)
Calcium: 7.4 mg/dL — ABNORMAL LOW (ref 8.9–10.3)
Chloride: 111 mmol/L (ref 98–111)
Creatinine, Ser: 1.19 mg/dL (ref 0.61–1.24)
GFR calc Af Amer: >60 mL/min (ref >60)
GFR calc non Af Amer: >60 mL/min (ref >60)
Glucose, Bld: 166 mg/dL — ABNORMAL HIGH (ref 70–99)
Potassium: 3 mmol/L — ABNORMAL LOW (ref 3.5–5.1)
Sodium: 143 mmol/L (ref 135–145)

## 2019-04-09 LAB — CULTURE, BLOOD (ROUTINE X 2)
Culture: NO GROWTH
Culture: NO GROWTH
Special Requests: ADEQUATE

## 2019-04-09 LAB — CBC
HCT: 21.8 % — ABNORMAL LOW (ref 39.0–52.0)
Hemoglobin: 7 g/dL — ABNORMAL LOW (ref 13.0–17.0)
MCH: 37.2 pg — ABNORMAL HIGH (ref 26.0–34.0)
MCHC: 32.1 g/dL (ref 30.0–36.0)
MCV: 116 fL — ABNORMAL HIGH (ref 80.0–100.0)
Platelets: 62 10*3/uL — ABNORMAL LOW (ref 150–400)
RBC: 1.88 MIL/uL — ABNORMAL LOW (ref 4.22–5.81)
RDW: 16.9 % — ABNORMAL HIGH (ref 11.5–15.5)
WBC: 12.4 10*3/uL — ABNORMAL HIGH (ref 4.0–10.5)
nRBC: 0 % (ref 0.0–0.2)

## 2019-04-09 LAB — HEMOGLOBIN AND HEMATOCRIT, BLOOD
HCT: 25.6 % — ABNORMAL LOW (ref 39.0–52.0)
Hemoglobin: 8.6 g/dL — ABNORMAL LOW (ref 13.0–17.0)

## 2019-04-09 LAB — PROTIME-INR
INR: 1.6 — ABNORMAL HIGH (ref 0.8–1.2)
Prothrombin Time: 19 seconds — ABNORMAL HIGH (ref 11.4–15.2)

## 2019-04-09 LAB — PREPARE RBC (CROSSMATCH)

## 2019-04-09 LAB — TSH: TSH: 0.806 u[IU]/mL (ref 0.350–4.500)

## 2019-04-09 LAB — AMMONIA: Ammonia: 16 umol/L (ref 9–35)

## 2019-04-09 MED ORDER — SODIUM CHLORIDE 0.9% IV SOLUTION: Freq: Once | INTRAVENOUS | Status: DC

## 2019-04-09 MED ORDER — METOPROLOL TARTRATE 5 MG/5ML IV SOLN
2.5000 mg | INTRAVENOUS | Status: AC
  Filled 2019-04-09: qty 5

## 2019-04-09 MED ORDER — VITAL HIGH PROTEIN PO LIQD: 1000.0000 mL | ORAL | Status: DC

## 2019-04-09 MED ORDER — PRO-STAT SUGAR FREE PO LIQD
30.0000 mL | Freq: Every day | ORAL | Status: DC
  Administered 2019-04-10 – 2019-04-17 (×7): 30 mL
  Filled 2019-04-09 (×7): qty 30

## 2019-04-09 MED ORDER — METOPROLOL TARTRATE 5 MG/5ML IV SOLN: 5.0000 mg | INTRAVENOUS | Status: AC

## 2019-04-09 MED ORDER — MAGNESIUM SULFATE 2 GM/50ML IV SOLN
2.0000 g | INTRAVENOUS | Status: AC
  Administered 2019-04-09 (×2): 2 g via INTRAVENOUS
  Filled 2019-04-09 (×2): qty 50

## 2019-04-09 MED ORDER — POTASSIUM CHLORIDE 10 MEQ/100ML IV SOLN
10.0000 meq | INTRAVENOUS | Status: AC
  Administered 2019-04-09 (×5): 10 meq via INTRAVENOUS
  Filled 2019-04-09 (×5): qty 100

## 2019-04-09 MED ORDER — OSMOLITE 1.2 CAL PO LIQD
1000.0000 mL | ORAL | Status: DC
  Administered 2019-04-10 – 2019-04-14 (×2): 1000 mL
  Filled 2019-04-09 (×4): qty 1000

## 2019-04-09 MED ORDER — METOPROLOL TARTRATE 5 MG/5ML IV SOLN
5.0000 mg | INTRAVENOUS | Status: DC | PRN
  Filled 2019-04-09: qty 5

## 2019-04-09 MED ORDER — KCL IN DEXTROSE-NACL 40-5-0.45 MEQ/L-%-% IV SOLN
INTRAVENOUS | Status: DC
  Administered 2019-04-09 – 2019-04-10 (×3): via INTRAVENOUS
  Filled 2019-04-09 (×3): qty 1000

## 2019-04-09 NOTE — Progress Notes (Signed)
[facility] Progress Note    Since last GI note: New afib overnight seems to be going in and out of it throughout the evening.    Objective: Vital signs in last 24 hours: Temp:  [98 F (36.7 C)-100.8 F (38.2 C)] 100.3 F (37.9 C) (09/21 0325) Pulse Rate:  [95-114] 114 (09/21 0700) Resp:  [19-31] 27 (09/21 0700) BP: (121-165)/(58-101) 131/88 (09/21 0700) SpO2:  [96 %-100 %] 98 % (09/21 0700) Last BM Date: 04/08/19 General: obtunded, responds to pain, voice Heart: regular rate and rhythm/alternating with IRR IRR Abdomen: soft, non-tender, non-distended, normal bowel sounds 1-2+ edema in ankles bilaterally.   Lab Results: Recent Labs    04/07/19 0500 04/08/19 0330 04/09/19 0433  WBC 10.6* 12.2* 12.4*  HGB 8.3* 8.0* 7.0*  PLT PLATELET CLUMPS NOTED ON SMEAR, UNABLE TO ESTIMATE 82* 62*  MCV 120.5* 115.4* 116.0*   Recent Labs    04/08/19 0330 04/08/19 1515 04/09/19 0433  NA 145 144 140  K 2.5* 2.6* 2.3*  CL 113* 109 108  CO2 21* 22 21*  GLUCOSE 166* 158* 443*  BUN 26* 25* 22*  CREATININE 1.35* 1.31* 1.24  CALCIUM 7.4* 7.6* 7.0*   Recent Labs    04/07/19 0500 04/08/19 0330 04/09/19 0433  PROT 6.6 6.3* 5.8*  ALBUMIN 2.3* 2.1* 2.0*  AST 84* 75* 55*  ALT 56* 50* 38  ALKPHOS 275* 277* 220*  BILITOT 2.5* 1.8* 1.4*   Recent Labs    04/08/19 0330 04/09/19 0433  INR 1.5* 1.6*     Medications: Scheduled Meds: . sodium chloride   Intravenous Once  . Chlorhexidine Gluconate Cloth  6 each Topical Daily  . folic acid  1 mg Intravenous Daily  . hydrocortisone  25 mg Rectal QHS  . insulin aspart  0-15 Units Subcutaneous Q4H  . lactulose  300 mL Rectal BID  . LORazepam  0-4 mg Intravenous Q4H   Followed by  . [START ON 04/10/2019] LORazepam  0-4 mg Intravenous Q8H  . mouth rinse  15 mL Mouth Rinse BID  . pantoprazole (PROTONIX) IV  40 mg Intravenous Q12H  . sodium chloride flush  10-40 mL Intracatheter Q12H  . thiamine injection  100 mg  Intravenous Daily   Continuous Infusions: . sodium chloride 250 mL (04/06/19 1827)  . dextrose 5 % and 0.45 % NaCl with KCl 40 mEq/L 75 mL/hr at 04/09/19 0717   PRN Meds:.LORazepam **OR** LORazepam, promethazine, sodium chloride flush    Assessment/Plan: 58 y.o. male admitted 10 days ago with alcoholic liver disease, hematemesis from from portal hypertensive changed top UGI tract (gastropathy, small esophageal varices)  He is obtunded (withdrawal? Encephalopathy?) , responds to pain and voice a bit however.  Continue lactulose enemas BID.   New afib overnight and he seems to be going in and out of this rhythm this morning.  Was in [place] until I examined him then back to afib.  Hopefully this will improve with electrolyte supplementation.  He does not have any overt GI bleeding (brown liquid in his flexiseal).  He will be at increased risk for bleeding if he heeds to start blood thinner given the portal gastropathy, varices.  Not much we can offer currently.  Need to continue supportive care in ICU.  He has been in hosp about 10 days now. Should consider nasal feeding tube placement so he can start tube feeds.  If he recovers he will eventually need further imaging of his pancreas (MRI/MRCP) to better understand the pancreatic tail  lesion noted on CT at admission.  Milus Banister, MD  04/09/2019, 8:04 AM [facility] Pager [phone]

## 2019-04-09 NOTE — Progress Notes (Addendum)
[facility] ° °PROGRESS NOTE ° °Donovon S Elsass MRN:[id number] DOB: 06/10/1961 DOA: 03/30/2019 °PCP: Newlin, Enobong, MD ° °Brief History   ° Creedon S Behring is a 58 y.o. year old male with medical history significant for chronic alcohol abuse, CAD s/p DES ( 2018), CHFpEF, HLD,  who presented on 03/30/2019 reports of worsening left flank pain, unwitnessed fall, nausea and vomiting for several days and episode of hematemesis  and initially admitted for concern for upper GI bleed causing AKI.   ° °ED Course:  °T-max 97.9 SPO2 95% on room air, HR 114, BP 143/71 ° °Hemoglobin 8.5, WBC 8.9, platelets 39 °INR 1.3 °K5.2, CO2 12, creatinine 3.67, BUN 72, albumin 3.1 ° °COVID test negative °Lipase 64 ° °CT chest abdomen for evaluation of reported fall with left upper flank pain: Unusual appearance of pancreatic tail, concern for complex pancreatic pseudocyst related to pancreatitis, however underlying mass not excluded, otherwise no significant acute traumatic injury to chest abdomen or pelvis, nonspecific retroperitoneal lymph node enlargement, atelectasis of right lower lobe, hepatomegaly with severe hepatic steatosis. ° °Patient received IV Ativan, IV thiamine, Librium, IV octreotide and was admitted to [facility] for further management ° °Hospital course complicated by worsening mentation/confusion and worsening tachypnea did not respond to BiPAP due to inability to protect airway was intubated on 9/13.  While encephalopathic patient was placed on octreotide and Protonix drip due to concern for upper GI bleed in setting of alcoholic hepatitis and possible underlying cirrhosis.  Patient is also received scheduled lactulose for management of hepatic encephalopathy renal was consulted due to high anion gap metabolic acidosis with AKI.  Patient received omeprazole due to concern for toxic alcohol ingestion given osmolar gap mild alcohol work-up was negative and presumed acidosis/AKI secondary to starvation  ketosis due to alcohol abuse with prerenal injury that improved with IV fluids. ° °Underwent EGD on 9/16 which showed grade 1 esophageal varices and portal hypertensive gastropathy. ° °Patient was extubated on 9/18 and transferred from PCCM service to [facility] for further management ° °Overnight on 9/19 patient found to be more agitated, tachycardic, tachypneic, and hypertensive. Was started on precedex gtt shortly due to concern for EtOH withdrawal. That was then changed to scheduled ativan according to CIWA protocol with close monitoring in step down unit. ° °Course now complicated by new onset atrial fibrillation with RVR, persistent hypokalemia, hyponatremia, hypophosphatemia concerning for refeeding syndrome.  Nutritional support added NG tube to start tube feeds and 9/21 ° ° ° ° °A & P  ° °Hypoactive delirium, likely multifactorial etiology including ICU acquired delirium, hepatic encephalopathy, sedative induced.  Timeline does not fit to be in active alcohol withdrawal given patient was previously on Precedex drip, long-acting Librium followed by Ativan CIWA protocol last 48 hours and has been hospital for > 10 days.  Discussed case with on-call critical care attending who advised to discontinue current sedatives (Librium, Ativan), delirium precautions, closely monitor in case patient does need respiratory support in case he is unable to protect airway, currently opening eyes to voice and moving hands not purposefully. Lactulose enema, neurochecks, monitor electrolytes, avoid further sedatives ° °Atrial fibrillation with RVR.  EKG confirms.  Likely in setting of hypokalemia/hypomagnesemia.  T Trial IV metoprolol x5 minutes as needed x5 doses, if heart rate does not remain less than 110 we will start diltiazem infusion. Check TSH for completion, telemetry monitoring. Hold off on anticoagulation given low hgb in setting of esophageal varices and high bleeding risk.  ° °  Alcohol abuse, out of  timeline for active withdrawal despite being in hospital for >10 days.   Recurrent hypertension, tachycardia, visual hallucinations and agitation on 9/20 prompting resuming CIWA protocol with scheduled IV Ativan and PRN.  Will dc sedatives as this is likely making his delirium worse. Monitor electrolytes, replete as needed, folic acid, Thiamine ° ° Possible cirrhosis with hepatic encephalopathy, worsening  °Only opens eyes to voice and waves hands (on 9/19 was able to tell me his name).  Worsening hepatic encephalopathy after reducing his lactulose dose 24 hours ago.  Now having stool output, will repeat ammonia.  Still protecting airway, low threshold to consult PCCM if mental status worsens/resultant hypoxia requiring possible intubation. ° °Alcohol hepatitis, hepatic steatosisNot a candidate for steroid therapy per GI given moderate severity of hepatitis ( PT 15.8, Bilirubin 1.8), ALT/AST downtrending, alk phos remains elevated, repeat CMP in am. Check abd xr to ensure no ascites ° °Hypokalemia and hypomagnesemia.   nutrition related to alcohol abuse, likely acutely worsened in setting of what seems to be active DTs/alcohol withdrawal.  High concern for refeeding syndrome, will place NG tube, start tube feeds, nutrition following, supplementing currently, closely monitor. ° °Hematemesis episode x1 in the setting of grade 1 esophageal varices and portal hypertensive gastropathy.   No recurrent hematemesis in hospital.   IV PPI twice daily given patient is n.p.o. with change in mental status.    S/p completion of 5 days of Rocephin  ° °Hypernatremia, improving.  Sodium 140, continue D5 half-normal until tube feeds start with NG tube,  ° °Left upper extremity swelling.  Venous duplex negative for  DVT ° °Acute on chronic macrocytic anemia,. Baseline 10-11.  Hemoglobin 7 on 9/21, will give 1 unit, monitor posttransfusion CBC.  No active bleeding.  Prior to this nadir of 7 before stabilizing at 8 after transfusion and  treatment for esophageal varices.  B12/folate within normal limits ° °Chronic diastolic CHF, stable.  TTE from 01/2017 with preserved EF, grade 1 diastolic dysfunction.  Stopped scheduled IV diuretics on 9/20 given almost 5 L out total, chest x-ray shows no edema, continues to have lower leg swelling likely related to hypoalbuminemia/dependent edema.  Continue daily weights, strict I's and O's. ° °Abnormal pancreatic tail incidentally found on CT abdomen.  GI recommends MRI/MRCP once patient clinically stable to be able to hold breath for procedure ° °Metabolic acidosis, previously had anion gap metabolic acidosis, resolved.   work-up for volatile alcohol was negative, likely due to starvation ketosis due to alcohol abuse. ° °AKI, improving.  Peak of 3.67, due to volume depletion, improved with fluids, still has normal output.  Nephrology agrees, currently 1.24.  Holding home losartan ° °Thrombocytopenia secondary to alcohol abuse/hepatic steatosis, stable.  Nadir of 24 °during admission, requiring transfusions, currently 62.  , closely monitor, not actively bleeding. ° °CAD.  Status post DES (2018).  Currently asymptomatic.  Currently holding home aspirin. ° ° ° °DVT prophylaxis: SCDs °Code Status: Full code °Family Communication: Updated significant other [name] by phone on 9/20 °Disposition Plan: Continue close monitoring of mental status, on scheduled ativan/CIWA protocol for what seems to be active DTs,  lactulose for worsening hepatic encephalopathy, close monitoring of electrolytes and nutritional status, close monitoring of heart rate for atrial fibrillation with RVR ° ° ° °[facility] °Direct contact: see [email or web address] (further directions at bottom of note if needed) °7PM-7AM contact night coverage as at bottom of note °04/09/2019, 3:04 PM  LOS: 10 days  ° °Consultants  °•   GI, nephrology, PCCM ° °Procedures  °• Intubated 9/13, extubated 9/18 °• EGD 9/16 ° °Antibiotics  °• Vancomycin 9/16 °• Ceftriaxone  9/16- 9/21 ° °Interval History/Subjective  °Found to be in A. fib with RVR overnight ° ° °Objective  ° °Vitals:  °Vitals:  ° 04/09/19 1400 04/09/19 1413  °BP: 130/62 121/65  °Pulse: 100 98  °Resp: (!) 22 (!) 25  °Temp:  98.4 °F (36.9 °C)  °SpO2: 98% 98%  ° ° °Exam: ° °Obese male, opens eyes to voice, moves hand snon-purposefully with mittens in place, °Symmetrical Chest wall movement, on room air, no wheezing or rales, gurgling seems to be in upper airway °Tachycardic,No Gallops,Rubs or new Murmurs, 1+ pitting edema bilateral lower extremities °1+ pitting edema of left arm from elbow down to wrist °Diminished bowel sounds, Abd Soft and distended, No tenderness,  No rebound - guarding or rigidity. ° ° ° °I have personally reviewed the following:  ° °Data Reviewed: °Basic Metabolic Panel: °Recent Labs  °Lab 04/07/19 °0500 04/07/19 °1630 04/08/19 °0330 04/08/19 °1515 04/09/19 °0433  °NA 148* 144 145 144 140  °K 2.6* 2.6* 2.5* 2.6* 2.3*  °CL 114* 113* 113* 109 108  °CO2 16* 18* 21* 22 21*  °GLUCOSE 160* 171* 166* 158* 443*  °BUN 31* 29* 26* 25* 22*  °CREATININE 1.44* 1.35* 1.35* 1.31* 1.24  °CALCIUM 7.3* 7.4* 7.4* 7.6* 7.0*  °MG 1.2* 2.5* 1.9 1.5* 1.5*  °PHOS 4.4 2.8 2.4* 2.3* 2.1*  ° °Liver Function Tests: °Recent Labs  °Lab 04/05/19 °0200 04/06/19 °0500 04/07/19 °0500 04/08/19 °0330 04/09/19 °0433  °AST 75* 94* 84* 75* 55*  °ALT 56* 59* 56* 50* 38  °ALKPHOS 216* 253* 275* 277* 220*  °BILITOT 2.3* 2.8* 2.5* 1.8* 1.4*  °PROT 5.7* 6.5 6.6 6.3* 5.8*  °ALBUMIN 2.0* 2.2* 2.3* 2.1* 2.0*  ° °No results for input(s): LIPASE, AMYLASE in the last 168 hours. °No results for input(s): AMMONIA in the last 168 hours. °CBC: °Recent Labs  °Lab 04/05/19 °0200 04/06/19 °0500 04/07/19 °0500 04/08/19 °0330 04/09/19 °0433  °WBC 5.0 11.3* 10.6* 12.2* 12.4*  °HGB 7.0* 7.9* 8.3* 8.0* 7.0*  °HCT 22.9* 26.0* 26.4* 24.7* 21.8*  °MCV 123.8* 127.5* 120.5* 115.4* 116.0*  °PLT 55* 74* PLATELET CLUMPS NOTED ON SMEAR, UNABLE TO ESTIMATE 82* 62*   ° °Cardiac Enzymes: °No results for input(s): CKTOTAL, CKMB, CKMBINDEX, TROPONINI in the last 168 hours. °BNP (last 3 results) °No results for input(s): BNP in the last 8760 hours. ° °ProBNP (last 3 results) °No results for input(s): PROBNP in the last 8760 hours. ° °CBG: °Recent Labs  °Lab 04/08/19 °2313 04/09/19 °0314 04/09/19 °0751 04/09/19 °1034 04/09/19 °1152  °GLUCAP 134* 154* 142* 165* 144*  ° ° °Recent Results (from the past 240 hour(s))  °MRSA PCR Screening     Status: None  ° Collection Time: 03/30/19  5:55 PM  ° Specimen: Nasal Mucosa; Nasopharyngeal  °Result Value Ref Range Status  ° MRSA by PCR NEGATIVE NEGATIVE Final  °  Comment:        °The GeneXpert MRSA Assay (FDA °approved for NASAL specimens °only), is one component of a °comprehensive MRSA colonization °surveillance program. It is not °intended to diagnose MRSA °infection nor to guide or °monitor treatment for °MRSA infections. °Performed at [facility], [address] [place], [place] [address] °  °Culture, blood (routine x 2)     Status: Abnormal  ° Collection Time: 04/02/19  8:23 PM  ° Specimen: BLOOD  °Result Value Ref   Range Status   Specimen Description   Final    BLOOD LEFT ARM Performed at [facility] [address] [place], [place] [address]    Special Requests   Final    BOTTLES DRAWN AEROBIC ONLY Blood Culture adequate volume Performed at [facility] [address] [place], [place] [address]    Culture  Setup Time   Final    GRAM POSITIVE COCCI IN CLUSTERS AEROBIC BOTTLE ONLY CRITICAL RESULT CALLED TO, READ BACK BY AND VERIFIED WITH: Sheffield Slider PHARMD [id number] 04/04/19 A BROWNING    Culture (A)  Final    STAPHYLOCOCCUS SPECIES (COAGULASE NEGATIVE) THE SIGNIFICANCE OF ISOLATING THIS ORGANISM FROM A SINGLE SET OF BLOOD CULTURES WHEN MULTIPLE SETS ARE DRAWN IS UNCERTAIN. PLEASE NOTIFY THE MICROBIOLOGY DEPARTMENT WITHIN ONE WEEK IF SPECIATION AND SENSITIVITIES ARE  REQUIRED. Performed at [facility], [place] [address] [place], [place] [address]    Report Status 04/05/2019 FINAL  Final  Blood Culture ID Panel (Reflexed)     Status: None   Collection Time: 04/02/19  8:23 PM  Result Value Ref Range Status   Enterococcus species NOT DETECTED NOT DETECTED Final   Listeria monocytogenes NOT DETECTED NOT DETECTED Final   Staphylococcus species NOT DETECTED NOT DETECTED Final   Staphylococcus aureus (BCID) NOT DETECTED NOT DETECTED Final   Streptococcus species NOT DETECTED NOT DETECTED Final   Streptococcus agalactiae NOT DETECTED NOT DETECTED Final   Streptococcus pneumoniae NOT DETECTED NOT DETECTED Final   Streptococcus pyogenes NOT DETECTED NOT DETECTED Final   Acinetobacter baumannii NOT DETECTED NOT DETECTED Final   Enterobacteriaceae species NOT DETECTED NOT DETECTED Final   Enterobacter cloacae complex NOT DETECTED NOT DETECTED Final   Escherichia coli NOT DETECTED NOT DETECTED Final   Klebsiella oxytoca NOT DETECTED NOT DETECTED Final   Klebsiella pneumoniae NOT DETECTED NOT DETECTED Final   Proteus species NOT DETECTED NOT DETECTED Final   Serratia marcescens NOT DETECTED NOT DETECTED Final   Haemophilus influenzae NOT DETECTED NOT DETECTED Final   Neisseria meningitidis NOT DETECTED NOT DETECTED Final   Pseudomonas aeruginosa NOT DETECTED NOT DETECTED Final   Candida albicans NOT DETECTED NOT DETECTED Final   Candida glabrata NOT DETECTED NOT DETECTED Final   Candida krusei NOT DETECTED NOT DETECTED Final   Candida parapsilosis NOT DETECTED NOT DETECTED Final   Candida tropicalis NOT DETECTED NOT DETECTED Final    Comment: Performed at [facility], [place] [address], [place], [place] [address]  Culture, blood (routine x 2)     Status: Abnormal   Collection Time: 04/02/19  8:25 PM   Specimen: BLOOD  Result Value Ref Range Status   Specimen Description   Final    BLOOD LEFT HAND Performed at [facility] [address] [place], [place] [address]    Special Requests   Final    BOTTLES DRAWN AEROBIC ONLY Blood Culture adequate volume Performed at [facility] [address] [place], [place] [address]    Culture  Setup Time   Final    AEROBIC BOTTLE ONLY GRAM POSITIVE COCCI CRITICAL VALUE NOTED.  VALUE IS CONSISTENT WITH PREVIOUSLY REPORTED AND CALLED VALUE.    Culture (A)  Final    STAPHYLOCOCCUS AURICULARIS THE SIGNIFICANCE OF ISOLATING THIS ORGANISM FROM A SINGLE SET OF BLOOD CULTURES WHEN MULTIPLE SETS ARE DRAWN IS UNCERTAIN. PLEASE NOTIFY THE MICROBIOLOGY DEPARTMENT WITHIN ONE WEEK IF SPECIATION AND SENSITIVITIES ARE REQUIRED. Performed at [facility], [address] [place], [place] [address] °  ° Report Status 04/08/2019 FINAL  Final  °Culture, blood (routine x 2)     Status: None  ° Collection Time: 04/04/19  5:51 PM  ° Specimen: BLOOD LEFT HAND  °Result Value Ref Range Status  ° Specimen Description   Final  °  BLOOD LEFT HAND °Performed at [facility], [address] [place], [place] [address] °  ° Special Requests   Final  °  BOTTLES DRAWN AEROBIC ONLY Blood Culture results may not be optimal due to an inadequate volume of blood received in culture bottles °Performed at [facility], [address] [place], [place] [address] °  ° Culture   Final  °  NO GROWTH 5 DAYS °Performed at [facility], [address] [place], [place] [address] °  ° Report Status 04/09/2019 FINAL  Final  °Culture, blood (routine x 2)     Status: None  ° Collection Time: 04/04/19  5:57 PM  ° Specimen: BLOOD  °Result Value Ref Range Status  ° Specimen Description   Final  °  BLOOD LEFT ANTECUBITAL °Performed at [facility], [address] [place], [place] [address] °  ° Special Requests   Final  °  BOTTLES DRAWN AEROBIC ONLY Blood Culture adequate volume °Performed at [facility], [address]  [place], [place] [address] °  ° Culture   Final  °  NO GROWTH 5 DAYS °Performed at [facility], [address] [place], [place] [address] °  ° Report Status 04/09/2019 FINAL  Final  °  ° °Studies: °Dg Chest Port 1 View ° °Result Date: 04/08/2019 °CLINICAL DATA:  Post extubation.  Follow-up exam. EXAM: PORTABLE CHEST 1 VIEW COMPARISON:  04/05/2019 and older studies. FINDINGS: Endotracheal tube and nasal/orogastric tube have been removed. Right internal jugular central venous line is stable. Lungs are clear.  Heart is normal size. No pleural effusion or pneumothorax IMPRESSION: 1. No active cardiopulmonary disease. 2. Status post extubation and removal of the nasogastric tube. Electronically Signed   By: David  Ormond M.D.   On: 04/08/2019 08:59  ° °Vas Us Upper Extremity Venous Duplex ° °Result Date: 04/08/2019 °UPPER VENOUS STUDY  Indications: Swelling Limitations: Poor patient position and poor ultrasound/tissue interface. Comparison Study: 04/21/17 negative Performing Technologist: Jill Parker RDMS, RVT  Examination Guidelines: A complete evaluation includes B-mode imaging, spectral Doppler, color Doppler, and power Doppler as needed of all accessible portions of each vessel. Bilateral testing is considered an integral part of a complete examination. Limited examinations for reoccurring indications may be performed as noted.  Right Findings: +-----+------------+---------+-----------+----------+--------------+  RIGHT Compressible Phasicity Spontaneous Properties    Summary      +-----+------------+---------+-----------+----------+--------------+  IJV                                                 Not visualized  +-----+------------+---------+-----------+----------+--------------+  Left Findings: +----------+------------+---------+-----------+----------+-------+  LEFT       Compressible Phasicity Spontaneous Properties Summary  +----------+------------+---------+-----------+----------+-------+  IJV                         Yes        Yes                         +----------+------------+---------+-----------+----------+-------+    Subclavian     Full        Yes        Yes                         +----------+------------+---------+-----------+----------+-------+  Axillary       Full        Yes        Yes                         +----------+------------+---------+-----------+----------+-------+  Brachial       Full        Yes        Yes                         +----------+------------+---------+-----------+----------+-------+  Radial         Full                                               +----------+------------+---------+-----------+----------+-------+  Ulnar          Full                                               +----------+------------+---------+-----------+----------+-------+  Cephalic       Full                                               +----------+------------+---------+-----------+----------+-------+  Basilic        Full                                               +----------+------------+---------+-----------+----------+-------+  Summary:  Left: No evidence of deep vein thrombosis in the upper extremity. No evidence of superficial vein thrombosis in the upper extremity.  *See table(s) above for measurements and observations.  Diagnosing physician: Charles Fields MD Electronically signed by Charles Fields MD on 04/08/2019 at 11:00:12 AM.    Final   ° °Us Ekg Site Rite ° °Result Date: 04/08/2019 °If Site Rite image not attached, placement could not be confirmed due to current cardiac rhythm. ° ° °Scheduled Meds: °• sodium chloride   Intravenous Once  °• sodium chloride   Intravenous Once  °• Chlorhexidine Gluconate Cloth  6 each Topical Daily  °• feeding supplement (PRO-STAT SUGAR FREE 64)  30 mL Per Tube Daily  °• folic acid  1 mg Intravenous Daily  °• hydrocortisone  25 mg Rectal QHS  °• insulin aspart  0-15 Units Subcutaneous Q4H  °• lactulose  300 mL Rectal BID  °• LORazepam  0-4 mg Intravenous Q4H  ° Followed  by  °• [START ON 04/10/2019] LORazepam  0-4 mg Intravenous Q8H  °• mouth rinse  15 mL Mouth Rinse BID  °• pantoprazole (PROTONIX) IV  40 mg Intravenous Q12H  °• sodium chloride flush  10-40 mL Intracatheter Q12H  °• thiamine injection  100 mg Intravenous Daily  ° °Continuous   Infusions:  sodium chloride 250 mL (04/06/19 1827)   dextrose 5 % and 0.45 % NaCl with KCl 40 mEq/L 75 mL/hr at 04/09/19 0717   feeding supplement (OSMOLITE 1.2 CAL)      Active Problems:   Anxiety state   EtOH dependence (HCC)   COLONIC POLYPS, HX OF   Infected sebaceous cyst of skin   Hypertensive heart disease without heart failure   Tachycardia   Chest pain on exertion   Acute hyperactive alcohol withdrawal delirium (HCC)   S/P drug eluting coronary stent placement   Upper GI bleed   Acute renal failure (HCC)   Alcoholic hepatitis without ascites   Flank pain   Tachypnea   Encephalopathy, hepatic (HCC)   Encounter for central line placement   Acute hypernatremia   Metabolic acidosis   Thrombocytopenia (HCC)   Left arm swelling   Hypomagnesemia   Chronic diastolic CHF (congestive heart failure) ([place])   Macrocytic anemia      Batya Citron D Enma Maeda  [facility]

## 2019-04-09 NOTE — Evaluation (Signed)
Physical Therapy Evaluation Patient Details Name: Richard Calhoun MRN: [id number] DOB: [birth year] Today's Date: 04/09/2019   History of Present Illness  58 yo male admitted to ED on 9/10 for R flank pain, ETOH, N/V. Pt requiring intubation 9/13-9/18.Endoscopy reveals nonbleeding erosive gastropathy, duodenal erosions; placed feeding tube. PMH includes anxiety, alcohol abuse, depression, GERD, HTN, HLD, tubular adenoma of colon, CAD with stent placement, PVD.  Clinical Impression   Pt presents with lethargy, body-wide weakness, extreme difficulty performing mobility tasks, impaired sitting balance, and decreased activity tolerance. Pt requires total assist +2 for all bed mobility tasks, unable to progress mobility OOB given pt's arousal level and weakness. Pt to benefit from acute PT to address deficits. PT recommending SNF level of care post-acutely given pt deficits. PT to progress mobility as tolerated, and will continue to follow acutely.   HR: 120-139 bpm SpO2: WFL, on RA RR: 18-34 breaths/min    Follow Up Recommendations SNF;Supervision/Assistance - 24 hour    Equipment Recommendations  Other (comment)(defer to next venue)    Recommendations for Other Services       Precautions / Restrictions Precautions Precautions: Fall Precaution Comments: CIWA Restrictions Weight Bearing Restrictions: No      Mobility  Bed Mobility Overal bed mobility: Needs Assistance Bed Mobility: Supine to Sit;Sit to Supine     Supine to sit: Total assist;HOB elevated;+2 for physical assistance;+2 for safety/equipment Sit to supine: Total assist;+2 for physical assistance;+2 for safety/equipment;HOB elevated   General bed mobility comments: Total assist +2 for supine<>sit for LE and trunk management, scooting to and from EOB. Pt able to sit with min assist upon sitting, fatigues quickly requiring mod-max assist +1 to correct posterior and L lateral leaning.  Transfers Overall transfer level:  (NT)                  Ambulation/Gait                Stairs            Wheelchair Mobility    Modified Rankin (Stroke Patients Only)       Balance Overall balance assessment: Needs assistance Sitting-balance support: Bilateral upper extremity supported;Feet supported Sitting balance-Leahy Scale: Poor Sitting balance - Comments: requires mod-max assist for regaining upright posture with fatigue. Pt sat EOB ~10 minutes with intermittent physical assist to correct balance. Postural control: Posterior lean Standing balance support: (NT)                                 Pertinent Vitals/Pain Pain Assessment: Faces Faces Pain Scale: Hurts little more Pain Location: generalized, but especially with bilateral UE mobility Pain Descriptors / Indicators: Grimacing;Moaning Pain Intervention(s): Limited activity within patient's tolerance;Monitored during session;Repositioned    Home Living Family/patient expects to be discharged to:: Unsure Living Arrangements: Other (Comment)(unsure, pt does not verbalize to PT)                    Prior Function                 Hand Dominance        Extremity/Trunk Assessment   Upper Extremity Assessment Upper Extremity Assessment: Difficult to assess due to impaired cognition    Lower Extremity Assessment Lower Extremity Assessment: Difficult to assess due to impaired cognition;Generalized weakness(Pt not following cues for assessment of LE and UE mobility; PT assuming generalized weakness given history of 5 day intubation and  bed-level activity since admission)    Cervical / Trunk Assessment Cervical / Trunk Assessment: Normal  Communication   Communication: Expressive difficulties  Cognition Arousal/Alertness: Lethargic Behavior During Therapy: Flat affect(difficult to assess due to pt drowsiness) Overall Cognitive Status: Impaired/Different from baseline Area of Impairment:  Orientation;Attention;Following commands;Safety/judgement;Problem solving;Awareness                 Orientation Level: Disoriented to;Time;Situation;Place Current Attention Level: Sustained   Following Commands: Follows one step commands inconsistently Safety/Judgement: Decreased awareness of safety Awareness: Intellectual Problem Solving: Decreased initiation;Difficulty sequencing;Requires verbal cues;Requires tactile cues General Comments: Pt nods "yes" when asked if his name is traxton. Pt with limited command following, espeically with mobility.      General Comments      Exercises General Exercises - Upper Extremity Elbow Flexion: PROM;Both;5 reps;Supine Elbow Extension: PROM;Both;5 reps;Supine General Exercises - Lower Extremity Heel Slides: PROM;Both;5 reps;Supine   Assessment/Plan    PT Assessment Patient needs continued PT services  PT Problem List Decreased strength;Decreased mobility;Decreased safety awareness;Decreased range of motion;Decreased activity tolerance;Decreased cognition;Decreased balance;Cardiopulmonary status limiting activity;Decreased knowledge of use of DME;Pain       PT Treatment Interventions DME instruction;Therapeutic activities;Functional mobility training;Gait training;Therapeutic exercise;Patient/family education;Neuromuscular re-education;Balance training    PT Goals (Current goals can be found in the Care Plan section)  Acute Rehab PT Goals PT Goal Formulation: Patient unable to participate in goal setting Time For Goal Achievement: 04/23/19 Potential to Achieve Goals: Fair    Frequency Min 2X/week   Barriers to discharge        Co-evaluation               AM-PAC PT "6 Clicks" Mobility  Outcome Measure Help needed turning from your back to your side while in a flat bed without using bedrails?: Total Help needed moving from lying on your back to sitting on the side of a flat bed without using bedrails?: Total Help needed  moving to and from a bed to a chair (including a wheelchair)?: Total Help needed standing up from a chair using your arms (e.g., wheelchair or bedside chair)?: Total Help needed to walk in hospital room?: Total Help needed climbing 3-5 steps with a railing? : Total 6 Click Score: 6    End of Session   Activity Tolerance: Patient limited by fatigue;Patient limited by lethargy Patient left: in bed;with call bell/phone within reach;with bed alarm set;with nursing/sitter in room(with bed pads in place) Nurse Communication: Mobility status PT Visit Diagnosis: Muscle weakness (generalized) (M62.81);Other abnormalities of gait and mobility (R26.89)    Time: [id number] PT Time Calculation (min) (ACUTE ONLY): 17 min   Charges:   PT Evaluation $PT Eval Low Complexity: 1 Low         Chibuikem Thang Conception Chancy, PT Acute Rehabilitation Services Pager [phone]  Office [phone]   Norely Schlick D Elonda Husky 04/09/2019, 2:08 PM

## 2019-04-09 NOTE — Progress Notes (Signed)
Attempted to place cortrak with Yehuda Budd, RN. We met resistance on the right side of the nostril and Zoe attempted in the left nostril. According to the CO2 detector attached, the cortrak was placed in the wrong place (lung) so the cortrak was removed. Dr. Lonny Prude notified.   Also informed MD about patient having minimal gag reflex and his increase in secretions with coughing through the day. Clear, thick sputum. No fever.

## 2019-04-09 NOTE — Progress Notes (Signed)
Nutrition Follow-up  DOCUMENTATION CODES:   Not applicable  INTERVENTION:  - weigh patient today. - once NGT placed, recommend Osmolite 1.2 @ 25 ml/hr to advance by 10 ml every 12 hours to goal rate of 65 ml/hr with 30 ml prostat once/day. - at goal rate, this regimen will provide 1972 kcal, 101 grams protein, and 1279 ml free water.   Monitor magnesium, potassium, and phosphorus daily for at least 3 days, MD to replete as needed, as pt is at risk for refeeding syndrome given current hypokalemia, hypomagnesemia, and hypophosphatemia.    NUTRITION DIAGNOSIS:   Inadequate oral intake related to inability to eat as evidenced by NPO status. -ongoing  GOAL:   Patient will meet greater than or equal to 90% of their needs -unmet/unable to meet  MONITOR:   Labs, Weight trends, Other (Comment)(NGT placement and initiation of TF)  ASSESSMENT:   58 year-old male with medical history of chronic alcohol abuse (half gallon of rum/day), CAD and PCI, unstable angina with stent placement in 2018, CHF, hyperlipidemia, mild peripheral vascular disease, and tachycardia. Patient presented to the ED on 9/11 d/t increasing malaise and increased sleeping duration x5 days, vomiting and poor PO intakes x3 days.  Significant Events: 9/11- admission 9/13- intubation; OGT placement 9/14- initial RD assessment 9/16- OGT removed and replaced 9/18- extubation and OGT removed 9/19- diet advanced from NPO to [place] 9/20- returned to NPO    No intakes documented during the time he was on FLD. Spoke with RN and likely plan for NGT placement today. Will monitor for this and for consult for TF. Patient has not been weighed since admission. Flow sheet indicates patient is disoriented x4 and he was resting at the time of RD visit.   Per notes: - alcohol abuse--apparent active withdrawal despite 10 day hospitalization - alcohol hepatitis, hepatic steatosis, possible cirrhosis with hepatic  encephalopathy--worsening - hypokalemia, hypomagnesemia, hypophosphatemia--related to alcohol abuse and worsening in setting of active DTs/alcohol withdrawal; repletion ordered - esophageal varices and portal hypertensive gastropathy - metabolic acidosis--resolved - AKI--improving   Labs reviewed; CBG: 154 mg/dl today, K: 2.3 mmol/l, BUN: 22 mg/dl, Ca: 7 mg/dl, Phos: 2.1 mg/dl, Mg: 1.5 mg/dl, Alk Phos elevated.  Medications reviewed; 1 mg IV folic acid/day, sliding scale novolog, 300 ml rectal lactulose BID, 40 mg IV protonix BID, 10 mEq IV KCl x5 runs 9/21, 100 mg IV thiamine/day. IVF; D5-1/2 NS-40 mEq KCl @ 75 ml/hr (306 kcal).    Diet Order:   Diet Order            Diet NPO time specified  Diet effective now              EDUCATION NEEDS:   No education needs have been identified at this time  Skin:  Skin Assessment: Reviewed RN Assessment  Last BM:  9/20  Height:   Ht Readings from Last 1 Encounters:  03/30/19 '5\' 10"'$  (1.778 m)    Weight:   Wt Readings from Last 1 Encounters:  03/30/19 88.9 kg    Ideal Body Weight:  75.4 kg  BMI:  Body mass index is 28.12 kg/m.  Estimated Nutritional Needs:   Kcal:  1900-2100 kcal  Protein:  95-105 grams  Fluid:  >/= 2 L/day     Jarome Matin, MS, RD, LDN, [facility] Inpatient Clinical Dietitian Pager # [phone] After hours/weekend pager # [phone]

## 2019-04-10 ENCOUNTER — Inpatient Hospital Stay (HOSPITAL_COMMUNITY): Payer: Self-pay

## 2019-04-10 DIAGNOSIS — R945 Abnormal results of liver function studies: Secondary | ICD-10-CM

## 2019-04-10 DIAGNOSIS — F05 Delirium due to known physiological condition: Secondary | ICD-10-CM

## 2019-04-10 DIAGNOSIS — I8511 Secondary esophageal varices with bleeding: Principal | ICD-10-CM

## 2019-04-10 LAB — TYPE AND SCREEN
ABO/RH(D): A POS
Antibody Screen: NEGATIVE
Unit division: 0

## 2019-04-10 LAB — COMPREHENSIVE METABOLIC PANEL
ALT: 34 U/L (ref 0–44)
AST: 48 U/L — ABNORMAL HIGH (ref 15–41)
Albumin: 2.1 g/dL — ABNORMAL LOW (ref 3.5–5.0)
Alkaline Phosphatase: 196 U/L — ABNORMAL HIGH (ref 38–126)
Anion gap: 10 (ref 5–15)
BUN: 24 mg/dL — ABNORMAL HIGH (ref 6–20)
CO2: 21 mmol/L — ABNORMAL LOW (ref 22–32)
Calcium: 7.2 mg/dL — ABNORMAL LOW (ref 8.9–10.3)
Chloride: 112 mmol/L — ABNORMAL HIGH (ref 98–111)
Creatinine, Ser: 1.12 mg/dL (ref 0.61–1.24)
GFR calc Af Amer: >60 mL/min (ref >60)
GFR calc non Af Amer: >60 mL/min (ref >60)
Glucose, Bld: 155 mg/dL — ABNORMAL HIGH (ref 70–99)
Potassium: 2.8 mmol/L — ABNORMAL LOW (ref 3.5–5.1)
Sodium: 143 mmol/L (ref 135–145)
Total Bilirubin: 2.2 mg/dL — ABNORMAL HIGH (ref 0.3–1.2)
Total Protein: 6.3 g/dL — ABNORMAL LOW (ref 6.5–8.1)

## 2019-04-10 LAB — BASIC METABOLIC PANEL
Anion gap: 8 (ref 5–15)
BUN: 20 mg/dL (ref 6–20)
CO2: 18 mmol/L — ABNORMAL LOW (ref 22–32)
Calcium: 6.6 mg/dL — ABNORMAL LOW (ref 8.9–10.3)
Chloride: 118 mmol/L — ABNORMAL HIGH (ref 98–111)
Creatinine, Ser: 0.98 mg/dL (ref 0.61–1.24)
GFR calc Af Amer: >60 mL/min (ref >60)
GFR calc non Af Amer: >60 mL/min (ref >60)
Glucose, Bld: 265 mg/dL — ABNORMAL HIGH (ref 70–99)
Potassium: 4.7 mmol/L (ref 3.5–5.1)
Sodium: 144 mmol/L (ref 135–145)

## 2019-04-10 LAB — GLUCOSE, CAPILLARY
Glucose-Capillary: 135 mg/dL — ABNORMAL HIGH (ref 70–99)
Glucose-Capillary: 120 mg/dL — ABNORMAL HIGH (ref 70–99)
Glucose-Capillary: 127 mg/dL — ABNORMAL HIGH (ref 70–99)
Glucose-Capillary: 102 mg/dL — ABNORMAL HIGH (ref 70–99)
Glucose-Capillary: 159 mg/dL — ABNORMAL HIGH (ref 70–99)
Glucose-Capillary: 101 mg/dL — ABNORMAL HIGH (ref 70–99)

## 2019-04-10 LAB — CBC
HCT: 25.1 % — ABNORMAL LOW (ref 39.0–52.0)
Hemoglobin: 8.3 g/dL — ABNORMAL LOW (ref 13.0–17.0)
MCH: 37.6 pg — ABNORMAL HIGH (ref 26.0–34.0)
MCHC: 33.1 g/dL (ref 30.0–36.0)
MCV: 113.6 fL — ABNORMAL HIGH (ref 80.0–100.0)
Platelets: DECREASED 10*3/uL (ref 150–400)
RBC: 2.21 MIL/uL — ABNORMAL LOW (ref 4.22–5.81)
RDW: 21.2 % — ABNORMAL HIGH (ref 11.5–15.5)
WBC: 14.2 10*3/uL — ABNORMAL HIGH (ref 4.0–10.5)
nRBC: 0 % (ref 0.0–0.2)

## 2019-04-10 LAB — BPAM RBC
Blood Product Expiration Date: 202009272359
ISSUE DATE / TIME: 202009211350
Unit Type and Rh: 600

## 2019-04-10 LAB — PROTIME-INR
INR: 1.6 — ABNORMAL HIGH (ref 0.8–1.2)
Prothrombin Time: 18.8 seconds — ABNORMAL HIGH (ref 11.4–15.2)

## 2019-04-10 LAB — PHOSPHORUS
Phosphorus: 3.1 mg/dL (ref 2.5–4.6)
Phosphorus: 2.4 mg/dL — ABNORMAL LOW (ref 2.5–4.6)

## 2019-04-10 LAB — MAGNESIUM
Magnesium: 2.2 mg/dL (ref 1.7–2.4)
Magnesium: 1.8 mg/dL (ref 1.7–2.4)

## 2019-04-10 MED ORDER — FUROSEMIDE 10 MG/ML IJ SOLN
40.0000 mg | Freq: Two times a day (BID) | INTRAMUSCULAR | Status: DC
  Administered 2019-04-10 – 2019-04-11 (×2): 40 mg via INTRAVENOUS
  Filled 2019-04-10 (×2): qty 4

## 2019-04-10 MED ORDER — IOHEXOL 300 MG/ML  SOLN: 50.0000 mL | Freq: Once | INTRAMUSCULAR | Status: DC | PRN

## 2019-04-10 MED ORDER — LACTULOSE ENEMA
300.0000 mL | Freq: Every day | ORAL | Status: DC
  Filled 2019-04-10: qty 300

## 2019-04-10 MED ORDER — POTASSIUM CHLORIDE 10 MEQ/100ML IV SOLN
10.0000 meq | INTRAVENOUS | Status: AC
  Administered 2019-04-10 (×4): 10 meq via INTRAVENOUS
  Filled 2019-04-10: qty 100

## 2019-04-10 NOTE — Progress Notes (Signed)
[facility] Progress Note  Chief Complaint: Alcoholic hepatitis  History:  Patient is clinically unchanged from yesterday's reports.  He remains obtunded.  There is been no overt GI bleeding, and continues to have loose brown stool in the rectal collection bag.  Nursing reports they were unable to pass a nasoduodenal tube last evening.  ROS: he is unable to give history or review of systems due to his altered mental status  Objective:   Current Facility-Administered Medications:  .  0.9 %  sodium chloride infusion (Manually program via Guardrails IV Fluids), , Intravenous, Once, Esterwood, Amy S, PA-C .  0.9 %  sodium chloride infusion (Manually program via Guardrails IV Fluids), , Intravenous, Once, Nettey, Shayla D, MD .  0.9 %  sodium chloride infusion, 250 mL, Intravenous, Continuous, Margaretha Seeds, MD, Last Rate: 10 mL/hr at 04/06/19 1827, 250 mL at 04/06/19 1827 .  Chlorhexidine Gluconate Cloth 2 % PADS 6 each, 6 each, Topical, Daily, Nita Sells, MD, 6 each at 04/09/19 1017 .  dextrose 5 % and 0.45 % NaCl with KCl 40 mEq/L infusion, , Intravenous, Continuous, Blount, Xenia T, NP, Last Rate: 75 mL/hr at 04/10/19 0831 .  feeding supplement (OSMOLITE 1.2 CAL) liquid 1,000 mL, 1,000 mL, Per Tube, Continuous, Nettey, Shayla D, MD .  feeding supplement (PRO-STAT SUGAR FREE 64) liquid 30 mL, 30 mL, Per Tube, Daily, Nettey, Shayla D, MD .  folic acid injection 1 mg, 1 mg, Intravenous, Daily, Margaretha Seeds, MD, 1 mg at 04/09/19 1036 .  hydrocortisone (ANUSOL-HC) suppository 25 mg, 25 mg, Rectal, QHS, Mansouraty, Telford Nab., MD, 25 mg at 04/09/19 2255 .  insulin aspart (novoLOG) injection 0-15 Units, 0-15 Units, Subcutaneous, Q4H, Margaretha Seeds, MD, 2 Units at 04/10/19 0455 .  lactulose (CHRONULAC) enema 200 gm, 300 mL, Rectal, BID, Oretha Milch D, MD, 300 mL at 04/10/19 0834 .  MEDLINE mouth rinse, 15 mL, Mouth Rinse, BID, Icard, Bradley L, DO, 15 mL at 04/09/19  2227 .  metoprolol tartrate (LOPRESSOR) injection 5 mg, 5 mg, Intravenous, Q5 min PRN, Oretha Milch D, MD .  pantoprazole (PROTONIX) injection 40 mg, 40 mg, Intravenous, Q12H, Oretha Milch D, MD, 40 mg at 04/09/19 2226 .  potassium chloride 10 mEq in 100 mL IVPB, 10 mEq, Intravenous, Q1 Hr x 4, Schorr, Rhetta Mura, NP, Last Rate: 100 mL/hr at 04/10/19 0831, 10 mEq at 04/10/19 0831 .  promethazine (PHENERGAN) tablet 12.5 mg, 12.5 mg, Oral, Q6H PRN, Samtani, Jai-Gurmukh, MD .  sodium chloride flush (NS) 0.9 % injection 10-40 mL, 10-40 mL, Intracatheter, Q12H, Samtani, Jai-Gurmukh, MD, 10 mL at 04/08/19 2109 .  sodium chloride flush (NS) 0.9 % injection 10-40 mL, 10-40 mL, Intracatheter, PRN, Nita Sells, MD .  [COMPLETED] thiamine 500mg  in normal saline (72ml) IVPB, 500 mg, Intravenous, Once, Stopped at 04/01/19 2159 **FOLLOWED BY** thiamine (B-1) injection 100 mg, 100 mg, Intravenous, Daily, Margaretha Seeds, MD, 100 mg at 04/09/19 1018  . sodium chloride 250 mL (04/06/19 1827)  . dextrose 5 % and 0.45 % NaCl with KCl 40 mEq/L 75 mL/hr at 04/10/19 0831  . feeding supplement (OSMOLITE 1.2 CAL)    . potassium chloride 10 mEq (04/10/19 0831)     Vital signs in last 24 hrs: Vitals:   04/10/19 0500 04/10/19 0656  BP: 118/67   Pulse: 100   Resp: (!) 25   Temp:  99.3 F (37.4 C)  SpO2: 95%     Intake/Output Summary (Last 24 hours) at  04/10/2019 0912 Last data filed at 04/10/2019 0831 Gross per 24 hour  Intake 2998.89 ml  Output 2450 ml  Net 548.89 ml     Physical Exam Stuporous  HEENT: sclera anicteric, oral mucosa without lesions  Neck: supple, no thyromegaly, JVD or lymphadenopathy  Cardiac: RRR without murmurs, S1S2 heard, 1-2+ peripheral edema  Pulm: Poor inspiratory effort, rhonchi bilaterally, wet cough  Abdomen: soft, no apparent tenderness, with active bowel sounds.  Difficult to assess hepatosplenomegaly due to abdominal girth.  Skin; warm and dry, no  jaundice, pale SCDs in place Recent Labs:  CBC Latest Ref Rng & Units 04/10/2019 04/09/2019 04/09/2019  WBC 4.0 - 10.5 K/uL 14.2(H) - 12.4(H)  Hemoglobin 13.0 - 17.0 g/dL 8.3(L) 8.6(L) 7.0(L)  Hematocrit 39.0 - 52.0 % 25.1(L) 25.6(L) 21.8(L)  Platelets 150 - 400 K/uL PLATELET CLUMPS NOTED ON SMEAR, COUNT APPEARS DECREASED - 62(L)    Recent Labs  Lab 04/10/19 0526  INR 1.6*   CMP Latest Ref Rng & Units 04/10/2019 04/09/2019 04/09/2019  Glucose 70 - 99 mg/dL 155(H) 166(H) 443(H)  BUN 6 - 20 mg/dL 24(H) 24(H) 22(H)  Creatinine 0.61 - 1.24 mg/dL 1.12 1.19 1.24  Sodium 135 - 145 mmol/L 143 143 140  Potassium 3.5 - 5.1 mmol/L 2.8(L) 3.0(L) 2.3(LL)  Chloride 98 - 111 mmol/L 112(H) 111 108  CO2 22 - 32 mmol/L 21(L) 21(L) 21(L)  Calcium 8.9 - 10.3 mg/dL 7.2(L) 7.4(L) 7.0(L)  Total Protein 6.5 - 8.1 g/dL 6.3(L) - 5.8(L)  Total Bilirubin 0.3 - 1.2 mg/dL 2.2(H) - 1.4(H)  Alkaline Phos 38 - 126 U/L 196(H) - 220(H)  AST 15 - 41 U/L 48(H) - 55(H)  ALT 0 - 44 U/L 34 - 38  Ammonia 16  @ASSESSMENTPLANBEGIN @ Assessment: Acute alcoholic hepatitis -his LFTs are slowly improving.  Remains mildly coagulopathic, baseline uncertain. Underlying alcohol cirrhosis Altered mental status -does not appear largely due to hepatic encephalopathy since his ammonia is 16 and he is receiving lactulose.  He has been in the hospital long enough that this would not seem to be alcohol withdrawal anymore.  Anemia of chronic disease, a component of acute blood loss anemia from his initial hematemesis from portal gastropathy.  Ongoing hyperglycemia and hypokalemia.   Plan: No further tests or changes in treatment planned.  He needs supportive care, especially as regards his altered mental status.  He also needs a nasoduodenal feeding tube, and should be sent to radiology for this.  He has been without significant nutrition for the entire hospitalization.  As noted in Dr. Audelia Acton note of yesterday, if the patient  recovers, he will eventually need further imaging of the pancreas with MRI/MRCP to better understand the pancreatic tail lesion noted on the admission CT scan.  We will sign off for now.  Call us as the need arises.  Total time 25 minutes Nelida Meuse III Office: [phone]

## 2019-04-10 NOTE — Progress Notes (Signed)
Nurse called and asked for the Pt to be NT suctioned. RT used the hard yankauer and went to the back of his throat and was able to get a moderate amount of clear white secretions. The Pt is coughing and isnt spitting any of the secretions out. RT explained to the nurse they deep suctioning with the yankauer would be easier for the Pt. RT will continue to monitor. The Pt seems to be to sedated

## 2019-04-10 NOTE — Progress Notes (Signed)
Pharmacy - Antimicrobial Stewardship  9/14 Bcx cultures with Staph species in the aerobic bottle of each set.  Confirmed with microbiology lab that they are NOT the same species.    Doreene Eland, PharmD, BCPS.   Work Cell: [phone] 04/10/2019 3:52 PM

## 2019-04-10 NOTE — Progress Notes (Signed)
[facility]  PROGRESS NOTE  Richard Calhoun TWS:[id number] DOB: 1961/03/25 DOA: 03/30/2019 PCP: Charlott Rakes, MD  Brief History    Richard Calhoun is a 58 y.o. year old male with medical history significant for chronic alcohol abuse, CAD s/p DES ( 2018), CHFpEF, HLD,  who presented on 03/30/2019 reports of worsening left flank pain, unwitnessed fall, nausea and vomiting for several days and episode of hematemesis  and initially admitted for concern for upper GI bleed causing AKI.    ED Course:  T-max 97.9 SPO2 95% on room air, HR 114, BP 143/71  Hemoglobin 8.5, WBC 8.9, platelets 39 INR 1.3 K5.2, CO2 12, creatinine 3.67, BUN 72, albumin 3.1  COVID test negative Lipase 64  CT chest abdomen for evaluation of reported fall with left upper flank pain: Unusual appearance of pancreatic tail, concern for complex pancreatic pseudocyst related to pancreatitis, however underlying mass not excluded, otherwise no significant acute traumatic injury to chest abdomen or pelvis, nonspecific retroperitoneal lymph node enlargement, atelectasis of right lower lobe, hepatomegaly with severe hepatic steatosis.  Patient received IV Ativan, IV thiamine, Librium, IV octreotide and was admitted to [facility] for further management  Hospital course complicated by worsening mentation/confusion and worsening tachypnea did not respond to BiPAP due to inability to protect airway was intubated on 9/13.  While encephalopathic patient was placed on octreotide and Protonix drip due to concern for upper GI bleed in setting of alcoholic hepatitis and possible underlying cirrhosis.  Patient is also received scheduled lactulose for management of hepatic encephalopathy renal was consulted due to high anion gap metabolic acidosis with AKI.  Patient received omeprazole due to concern for toxic alcohol ingestion given osmolar gap mild alcohol work-up was negative and presumed acidosis/AKI secondary to starvation  ketosis due to alcohol abuse with prerenal injury that improved with IV fluids.  Underwent EGD on 9/16 which showed grade 1 esophageal varices and portal hypertensive gastropathy.  Patient was extubated on 9/18 and transferred from [facility] to [facility] for further management  Overnight on 9/19 patient found to be more agitated, tachycardic, tachypneic, and hypertensive. Was started on precedex gtt shortly due to concern for EtOH withdrawal. That was then changed to scheduled ativan according to CIWA protocol with close monitoring in step down unit.  Course now complicated by new onset atrial fibrillation with RVR, persistent hypokalemia, hyponatremia, hypophosphatemia concerning for refeeding syndrome.  Nutritional support added NG tube to start tube feeds on 9/21     A & P   Hypoactive delirium, likely multifactorial etiology including ICU acquired delirium, hepatic encephalopathy, sedative induced, improving.  Mental status is much improved, trying to vocalize, waving at me when I entered the room, easily opening eyes to voice, 24 hours ago patient would only barely open eyes to voice.  Fix troponin is likely ICU delirium, acutely worsened this patient is on scheduled Librium and Ativan due to concern for alcohol withdrawal despite being in hospital for greater than 10 days.  Having normal output on lactulose enema, ammonia 16. Discussed case with on-call critical care attending(9/21) who advised to discontinue current sedatives (Librium, Ativan), delirium precautions, closely monitor in case patient does need respiratory support in case he is unable to protect airway. Neurochecks, monitor electrolytes, avoid further sedatives  Atrial fibrillation with RVR, improving.  EKG confirms.  Likely in setting of hypokalemia/hypomagnesemia.   Trial IV metoprolol x5 minutes as needed x5 doses, if heart rate does not remain less than 110, start diltiazem  infusion.  TSH within normal  limits, telemetry monitoring. Hold off on anticoagulation given low hgb in setting of esophageal varices and high bleeding risk.   Alcohol abuse  Recurrent hypertension, tachycardia, visual hallucinations and agitation on 9/20 prompting resuming CIWA protocol with scheduled IV Ativan and PRN.  Hemodynamically stable, status much improved now off sedatives.  Still has significant electrolyte abnormalities and requiring nutrition.  Awaiting NG tube placement,  Monitor electrolytes, replete as needed, folic acid, Thiamine   Possible cirrhosis with hepatic encephalopathy, stable Ammonia 16, having lots of rectal output, will decrease lactulose enema.  Mental status is improved as mentioned above  Alcohol hepatitis, hepatic steatosis, stable not a candidate for steroid therapy per GI given moderate severity of hepatitis ( PT 15.8, Bilirubin 1.8), ALT/AST downtrending, alk phos remains elevated, repeat CMP in am.  No ascites on abdominal x-ray.   Hypokalemia and hypomagnesemia, persistent.   malnutrition related to alcohol abuse,.  High concern for refeeding syndrome, will place NG tube by IR, start tube feeds, nutrition following, supplementing currently, closely monitor.  Hematemesis episode x1 in the setting of grade 1 esophageal varices and portal hypertensive gastropathy.   No recurrent hematemesis in hospital.   IV PPI twice daily given patient is n.p.o. while awaiting NG tube placement, can then switch to oral PPI    s/p completion of 5 days of Rocephin   Hypernatremia, improving.  Sodium 140, continue D5 half-normal until tube feeds start with NG tube,   Left upper extremity swelling.  Venous duplex negative for  DVT  Acute on chronic macrocytic anemia,. Baseline 10-11.  Hemoglobin 7 on 9/21, will give 1 unit, monitor posttransfusion CBC.  No active bleeding.  Prior to this nadir of 7 before stabilizing at 8 after transfusion and treatment for esophageal varices.  B12/folate within normal  limits  Chronic diastolic CHF, stable.  TTE from 01/2017 with preserved EF, grade 1 diastolic dysfunction.  Stopped scheduled IV diuretics on 9/20 given almost 5 L out total, chest x-ray shows no edema, continues to have lower leg swelling likely related to hypoalbuminemia/dependent edema.  Continue daily weights, strict I's and O's, resume diuresis to avoid volume overload given maintenance fluids while NPO and awaiting NGT  Abnormal pancreatic tail incidentally found on CT abdomen.  GI recommends MRI/MRCP once patient clinically stable to be able to hold breath for procedure  Metabolic acidosis, previously had anion gap metabolic acidosis, resolved.   work-up for volatile alcohol was negative, likely due to starvation ketosis due to alcohol abuse.  AKI, improving.  Peak of 3.67, due to volume depletion, improved with fluids, still has normal output.  Nephrology agrees, currently 1.12.  Holding home losartan  Thrombocytopenia secondary to alcohol abuse/hepatic steatosis, stable.  Nadir of 24 during admission, requiring transfusions,  closely monitor, not actively bleeding.  CAD.  Status post DES (2018).  Currently asymptomatic.  Currently holding home aspirin.    DVT prophylaxis: SCDs Code Status: Full code Family Communication: Updated significant other [name] by phone on 9/20, will call again today Disposition Plan: Continue close monitoring of mental status, close monitoring of electrolytes and nutritional status and respiratory status    [facility] Direct contact: see [email or web address] (further directions at bottom of note if needed) 7PM-7AM contact night coverage as at bottom of note 04/10/2019, 12:35 PM  LOS: 11 days   Consultants   GI, nephrology, PCCM  Procedures   Intubated 9/13, extubated 9/18  EGD 9/16  Antibiotics   Vancomycin 9/16  Ceftriaxone 9/16-  9/21  Interval History/Subjective  Try to speak to me during exam Opens eyes, tracks during conversation,  waving at me   Objective   Vitals:  Vitals:   04/10/19 1100 04/10/19 1200  BP: (!) 141/76 (!) 147/77  Pulse: 93 96  Resp: (!) 24 19  Temp:    SpO2: 97% 97%    Exam:  Obese male, opens eyes to voice, waving at me upon entering room, eyes track to voice, trying to vocalize during conversation Symmetrical Chest wall movement, on room air, no wheezing or rales, increased upper airway sounds  ,No Gallops,Rubs or new Murmurs, scant edema bilateral lower extremities 1+ pitting edema of left arm from elbow down to wrist Diminished bowel sounds, Abd Soft and distended, No tenderness,  No rebound - guarding or rigidity.    I have personally reviewed the following:   Data Reviewed: Basic Metabolic Panel: Recent Labs  Lab 04/08/19 0330 04/08/19 1515 04/09/19 0433 04/09/19 1800 04/10/19 0526  NA 145 144 140 143 143  K 2.5* 2.6* 2.3* 3.0* 2.8*  CL 113* 109 108 111 112*  CO2 21* 22 21* 21* 21*  GLUCOSE 166* 158* 443* 166* 155*  BUN 26* 25* 22* 24* 24*  CREATININE 1.35* 1.31* 1.24 1.19 1.12  CALCIUM 7.4* 7.6* 7.0* 7.4* 7.2*  MG 1.9 1.5* 1.5* 2.5* 2.2  PHOS 2.4* 2.3* 2.1* 2.6 3.1   Liver Function Tests: Recent Labs  Lab 04/06/19 0500 04/07/19 0500 04/08/19 0330 04/09/19 0433 04/10/19 0526  AST 94* 84* 75* 55* 48*  ALT 59* 56* 50* 38 34  ALKPHOS 253* 275* 277* 220* 196*  BILITOT 2.8* 2.5* 1.8* 1.4* 2.2*  PROT 6.5 6.6 6.3* 5.8* 6.3*  ALBUMIN 2.2* 2.3* 2.1* 2.0* 2.1*   No results for input(s): LIPASE, AMYLASE in the last 168 hours. Recent Labs  Lab 04/09/19 1612  AMMONIA 16   CBC: Recent Labs  Lab 04/06/19 0500 04/07/19 0500 04/08/19 0330 04/09/19 0433 04/09/19 1800 04/10/19 0526  WBC 11.3* 10.6* 12.2* 12.4*  --  14.2*  HGB 7.9* 8.3* 8.0* 7.0* 8.6* 8.3*  HCT 26.0* 26.4* 24.7* 21.8* 25.6* 25.1*  MCV 127.5* 120.5* 115.4* 116.0*  --  113.6*  PLT 74* PLATELET CLUMPS NOTED ON SMEAR, UNABLE TO ESTIMATE 82* 62*  --  PLATELET CLUMPS NOTED ON SMEAR, COUNT APPEARS  DECREASED   Cardiac Enzymes: No results for input(s): CKTOTAL, CKMB, CKMBINDEX, TROPONINI in the last 168 hours. BNP (last 3 results) No results for input(s): BNP in the last 8760 hours.  ProBNP (last 3 results) No results for input(s): PROBNP in the last 8760 hours.  CBG: Recent Labs  Lab 04/09/19 2008 04/09/19 2342 04/10/19 0345 04/10/19 0743 04/10/19 1201  GLUCAP 140* 123* 135* 120* 127*    Recent Results (from the past 240 hour(s))  Culture, blood (routine x 2)     Status: Abnormal   Collection Time: 04/02/19  8:23 PM   Specimen: BLOOD  Result Value Ref Range Status   Specimen Description   Final    BLOOD LEFT ARM Performed at [facility], [place] [address], [place], [place] [address]    Special Requests   Final    BOTTLES DRAWN AEROBIC ONLY Blood Culture adequate volume Performed at [place] [address], [place], [place] [address]    Culture  Setup Time   Final    GRAM POSITIVE COCCI IN CLUSTERS AEROBIC BOTTLE ONLY CRITICAL RESULT CALLED TO, READ BACK BY AND VERIFIED WITH: Sheffield Slider  PHARMD 1828 04/04/19 A BROWNING    Culture (A)  Final    STAPHYLOCOCCUS SPECIES (COAGULASE NEGATIVE) THE SIGNIFICANCE OF ISOLATING THIS ORGANISM FROM A SINGLE SET OF BLOOD CULTURES WHEN MULTIPLE SETS ARE DRAWN IS UNCERTAIN. PLEASE NOTIFY THE MICROBIOLOGY DEPARTMENT WITHIN ONE WEEK IF SPECIATION AND SENSITIVITIES ARE REQUIRED. Performed at [facility], [place] [address], [place], [place] [address]    Report Status 04/05/2019 FINAL  Final  Blood Culture ID Panel (Reflexed)     Status: None   Collection Time: 04/02/19  8:23 PM  Result Value Ref Range Status   Enterococcus species NOT DETECTED NOT DETECTED Final   Listeria monocytogenes NOT DETECTED NOT DETECTED Final   Staphylococcus species NOT DETECTED NOT DETECTED Final   Staphylococcus aureus (BCID) NOT DETECTED NOT DETECTED Final   Streptococcus species NOT DETECTED NOT DETECTED  Final   Streptococcus agalactiae NOT DETECTED NOT DETECTED Final   Streptococcus pneumoniae NOT DETECTED NOT DETECTED Final   Streptococcus pyogenes NOT DETECTED NOT DETECTED Final   Acinetobacter baumannii NOT DETECTED NOT DETECTED Final   Enterobacteriaceae species NOT DETECTED NOT DETECTED Final   Enterobacter cloacae complex NOT DETECTED NOT DETECTED Final   Escherichia coli NOT DETECTED NOT DETECTED Final   Klebsiella oxytoca NOT DETECTED NOT DETECTED Final   Klebsiella pneumoniae NOT DETECTED NOT DETECTED Final   Proteus species NOT DETECTED NOT DETECTED Final   Serratia marcescens NOT DETECTED NOT DETECTED Final   Haemophilus influenzae NOT DETECTED NOT DETECTED Final   Neisseria meningitidis NOT DETECTED NOT DETECTED Final   Pseudomonas aeruginosa NOT DETECTED NOT DETECTED Final   Candida albicans NOT DETECTED NOT DETECTED Final   Candida glabrata NOT DETECTED NOT DETECTED Final   Candida krusei NOT DETECTED NOT DETECTED Final   Candida parapsilosis NOT DETECTED NOT DETECTED Final   Candida tropicalis NOT DETECTED NOT DETECTED Final    Comment: Performed at [facility], [place] [address], [place], [place] [address]  Culture, blood (routine x 2)     Status: Abnormal   Collection Time: 04/02/19  8:25 PM   Specimen: BLOOD  Result Value Ref Range Status   Specimen Description   Final    BLOOD LEFT HAND Performed at [place] [address], [place], [place] [address]    Special Requests   Final    BOTTLES DRAWN AEROBIC ONLY Blood Culture adequate volume Performed at [place] [address], [place], [place] [address]    Culture  Setup Time   Final    AEROBIC BOTTLE ONLY GRAM POSITIVE COCCI CRITICAL VALUE NOTED.  VALUE IS CONSISTENT WITH PREVIOUSLY REPORTED AND CALLED VALUE.    Culture (A)  Final    STAPHYLOCOCCUS AURICULARIS THE SIGNIFICANCE OF ISOLATING THIS ORGANISM FROM A SINGLE SET OF BLOOD CULTURES WHEN MULTIPLE SETS  ARE DRAWN IS UNCERTAIN. PLEASE NOTIFY THE MICROBIOLOGY DEPARTMENT WITHIN ONE WEEK IF SPECIATION AND SENSITIVITIES ARE REQUIRED. Performed at [facility], [place] [address], [place], [place] [address]    Report Status 04/08/2019 FINAL  Final  Culture, blood (routine x 2)     Status: None   Collection Time: 04/04/19  5:51 PM   Specimen: BLOOD LEFT HAND  Result Value Ref Range Status   Specimen Description   Final    BLOOD LEFT HAND Performed at [place] [address] [place],  [address]    Special Requests   Final    BOTTLES DRAWN AEROBIC ONLY Blood  Culture results may not be optimal due to an inadequate volume of blood received in culture bottles Performed at [place] [address] [place], [place] [address]    Culture   Final    NO GROWTH 5 DAYS Performed at [facility], [place] [address] [place], [place] [address]    Report Status 04/09/2019 FINAL  Final  Culture, blood (routine x 2)     Status: None   Collection Time: 04/04/19  5:57 PM   Specimen: BLOOD  Result Value Ref Range Status   Specimen Description   Final    BLOOD LEFT ANTECUBITAL Performed at [place] [address] [place], [place] [address]    Special Requests   Final    BOTTLES DRAWN AEROBIC ONLY Blood Culture adequate volume Performed at [place] [address] [place], [place] [address]    Culture   Final    NO GROWTH 5 DAYS Performed at [facility], [place] [address], [place], [place] [address]    Report Status 04/09/2019 FINAL  Final  Culture, blood (routine x 2)     Status: None (Preliminary result)   Collection Time: 04/09/19 10:10 AM   Specimen: BLOOD  Result Value Ref Range Status   Specimen Description   Final    BLOOD LEFT ARM Performed at [facility] [address], [place], [place] [address]    Special Requests   Final    BOTTLES DRAWN AEROBIC AND  ANAEROBIC Blood Culture adequate volume Performed at [facility] [place] [address], [place], [place] [address]    Culture   Final    NO GROWTH < 24 HOURS Performed at [facility] [address], [place], [place] [address]    Report Status PENDING  Incomplete  Culture, blood (routine x 2)     Status: None (Preliminary result)   Collection Time: 04/09/19 10:12 AM   Specimen: BLOOD  Result Value Ref Range Status   Specimen Description   Final    BLOOD LEFT ARM Performed at [place] [address], [place], [place] [address]    Special Requests   Final    BOTTLES DRAWN AEROBIC AND ANAEROBIC Blood Culture adequate volume Performed at [facility] [address], [place], [place] [address]    Culture   Final    NO GROWTH < 24 HOURS Performed at [facility] [address], [place],  [address]    Report Status PENDING  Incomplete     Studies: Dg Chest Port 1 View  Result Date: 04/09/2019 CLINICAL DATA:  Hepatic steatosis, confusion EXAM: PORTABLE CHEST 1 VIEW COMPARISON:  04/08/2019 FINDINGS: Heart is borderline in size. Left base atelectasis. Mild peribronchial thickening. Right lung clear. No effusions. No acute bony abnormality. Right PICC line in place with the tip in the SVC. IMPRESSION: Left base atelectasis.  Mild bronchitic changes. Electronically Signed   By: Rolm Baptise M.D.   On: 04/09/2019 17:40   Dg Abd Portable 1v  Result Date: 04/09/2019 CLINICAL DATA:  Hepatic steatosis.  Confusion. EXAM: PORTABLE ABDOMEN - 1 VIEW COMPARISON:  04/06/2019 FINDINGS: Decreasing gaseous distention of the transverse colon. Nonobstructive bowel gas pattern. No free air. Probable hepatomegaly. IMPRESSION: Nonobstructive bowel gas pattern. Hepatomegaly. Electronically Signed   By: Rolm Baptise M.D.   On: 04/09/2019 17:39    Scheduled Meds:  sodium chloride   Intravenous Once  sodium chloride   Intravenous  Once   Chlorhexidine Gluconate Cloth  6 each Topical Daily   feeding supplement (PRO-STAT SUGAR FREE 64)  30 mL Per Tube Daily   folic acid  1 mg Intravenous Daily   hydrocortisone  25 mg Rectal QHS   insulin aspart  0-15 Units Subcutaneous Q4H   lactulose  300 mL Rectal BID   mouth rinse  15 mL Mouth Rinse BID   pantoprazole (PROTONIX) IV  40 mg Intravenous Q12H   sodium chloride flush  10-40 mL Intracatheter Q12H   thiamine injection  100 mg Intravenous Daily   Continuous Infusions:  sodium chloride 250 mL (04/06/19 1827)   dextrose 5 % and 0.45 % NaCl with KCl 40 mEq/L 75 mL/hr at 04/10/19 1200   feeding supplement (OSMOLITE 1.2 CAL)     potassium chloride 10 mEq (04/10/19 1142)    Active Problems:   Anxiety state   EtOH dependence (HCC)   COLONIC POLYPS, HX OF   Infected sebaceous cyst of skin   Hypertensive heart disease without heart failure   Tachycardia   Chest pain on exertion   Acute hyperactive alcohol withdrawal delirium (HCC)   S/P drug eluting coronary stent placement   Upper GI bleed   Acute renal failure (HCC)   Alcoholic hepatitis without ascites   Flank pain   Tachypnea   Encephalopathy, hepatic (HCC)   Encounter for central line placement   Acute hypernatremia   Metabolic acidosis   Thrombocytopenia (HCC)   Left arm swelling   Hypomagnesemia   Chronic diastolic CHF (congestive heart failure) (HCC)   Macrocytic anemia   Atrial fibrillation, rapid ([place])      Myrlene Broker D Alayiah Fontes  [facility]

## 2019-04-11 ENCOUNTER — Inpatient Hospital Stay (HOSPITAL_COMMUNITY): Payer: Self-pay

## 2019-04-11 LAB — COMPREHENSIVE METABOLIC PANEL
ALT: 34 U/L (ref 0–44)
AST: 58 U/L — ABNORMAL HIGH (ref 15–41)
Albumin: 2 g/dL — ABNORMAL LOW (ref 3.5–5.0)
Alkaline Phosphatase: 201 U/L — ABNORMAL HIGH (ref 38–126)
Anion gap: 10 (ref 5–15)
BUN: 25 mg/dL — ABNORMAL HIGH (ref 6–20)
CO2: 20 mmol/L — ABNORMAL LOW (ref 22–32)
Calcium: 7.6 mg/dL — ABNORMAL LOW (ref 8.9–10.3)
Chloride: 117 mmol/L — ABNORMAL HIGH (ref 98–111)
Creatinine, Ser: 1.21 mg/dL (ref 0.61–1.24)
GFR calc Af Amer: >60 mL/min (ref >60)
GFR calc non Af Amer: >60 mL/min (ref >60)
Glucose, Bld: 131 mg/dL — ABNORMAL HIGH (ref 70–99)
Potassium: 2.8 mmol/L — ABNORMAL LOW (ref 3.5–5.1)
Sodium: 147 mmol/L — ABNORMAL HIGH (ref 135–145)
Total Bilirubin: 2.1 mg/dL — ABNORMAL HIGH (ref 0.3–1.2)
Total Protein: 6 g/dL — ABNORMAL LOW (ref 6.5–8.1)

## 2019-04-11 LAB — PROTIME-INR
INR: 1.6 — ABNORMAL HIGH (ref 0.8–1.2)
Prothrombin Time: 18.7 seconds — ABNORMAL HIGH (ref 11.4–15.2)

## 2019-04-11 LAB — PHOSPHORUS: Phosphorus: 3.4 mg/dL (ref 2.5–4.6)

## 2019-04-11 LAB — CBC
HCT: 24.8 % — ABNORMAL LOW (ref 39.0–52.0)
Hemoglobin: 8 g/dL — ABNORMAL LOW (ref 13.0–17.0)
MCH: 37 pg — ABNORMAL HIGH (ref 26.0–34.0)
MCHC: 32.3 g/dL (ref 30.0–36.0)
MCV: 114.8 fL — ABNORMAL HIGH (ref 80.0–100.0)
Platelets: DECREASED 10*3/uL (ref 150–400)
RBC: 2.16 MIL/uL — ABNORMAL LOW (ref 4.22–5.81)
RDW: 20.5 % — ABNORMAL HIGH (ref 11.5–15.5)
WBC: 11 10*3/uL — ABNORMAL HIGH (ref 4.0–10.5)
nRBC: 0 % (ref 0.0–0.2)

## 2019-04-11 LAB — MAGNESIUM: Magnesium: 1.7 mg/dL (ref 1.7–2.4)

## 2019-04-11 LAB — GLUCOSE, CAPILLARY
Glucose-Capillary: 111 mg/dL — ABNORMAL HIGH (ref 70–99)
Glucose-Capillary: 119 mg/dL — ABNORMAL HIGH (ref 70–99)
Glucose-Capillary: 120 mg/dL — ABNORMAL HIGH (ref 70–99)
Glucose-Capillary: 126 mg/dL — ABNORMAL HIGH (ref 70–99)
Glucose-Capillary: 130 mg/dL — ABNORMAL HIGH (ref 70–99)
Glucose-Capillary: 131 mg/dL — ABNORMAL HIGH (ref 70–99)

## 2019-04-11 MED ORDER — MORPHINE SULFATE (PF) 2 MG/ML IV SOLN
1.0000 mg | INTRAVENOUS | Status: DC | PRN
  Administered 2019-04-11: 1 mg via INTRAVENOUS
  Filled 2019-04-11: qty 1

## 2019-04-11 MED ORDER — POTASSIUM CHLORIDE 20 MEQ PO PACK
40.0000 meq | PACK | Freq: Once | ORAL | Status: AC
  Administered 2019-04-11: 40 meq via ORAL
  Filled 2019-04-11: qty 2

## 2019-04-11 MED ORDER — POTASSIUM CHLORIDE 10 MEQ/100ML IV SOLN
10.0000 meq | INTRAVENOUS | Status: AC
  Administered 2019-04-11 (×4): 10 meq via INTRAVENOUS
  Filled 2019-04-11 (×4): qty 100

## 2019-04-11 MED ORDER — VITAMIN B-1 100 MG PO TABS
100.0000 mg | ORAL_TABLET | Freq: Every day | ORAL | Status: DC
  Administered 2019-04-12 – 2019-04-14 (×3): 100 mg via NASOGASTRIC
  Filled 2019-04-11 (×3): qty 1

## 2019-04-11 MED ORDER — LIP MEDEX EX OINT
1.0000 "application " | TOPICAL_OINTMENT | CUTANEOUS | Status: DC | PRN
  Filled 2019-04-11: qty 7

## 2019-04-11 MED ORDER — PANTOPRAZOLE SODIUM 40 MG PO PACK
40.0000 mg | PACK | Freq: Two times a day (BID) | ORAL | Status: DC
  Administered 2019-04-11 – 2019-04-14 (×6): 40 mg
  Filled 2019-04-11 (×7): qty 20

## 2019-04-11 MED ORDER — METOPROLOL TARTRATE 25 MG/10 ML ORAL SUSPENSION
25.0000 mg | Freq: Two times a day (BID) | ORAL | Status: DC
  Administered 2019-04-11 – 2019-04-14 (×7): 25 mg via ORAL
  Filled 2019-04-11 (×9): qty 10

## 2019-04-11 MED ORDER — MAGNESIUM SULFATE 2 GM/50ML IV SOLN
2.0000 g | Freq: Once | INTRAVENOUS | Status: AC
  Administered 2019-04-11: 2 g via INTRAVENOUS
  Filled 2019-04-11: qty 50

## 2019-04-11 NOTE — Progress Notes (Signed)
The patient is receiving Protonix by the intravenous route.  Based on criteria approved by the Pharmacy and [place], the medication is being converted to the equivalent oral dose form, given via tube  These criteria include: -No active GI bleeding -Able to tolerate diet of full liquids (or better) or tube feeding -Able to tolerate other medications by the oral or enteral route  If you have any questions about this conversion, please contact the Pharmacy Department (phone [phone]).  Thank you.  Minda Ditto PharmD Pager [phone] 04/11/2019, 1:55 PM

## 2019-04-11 NOTE — Progress Notes (Signed)
Feeding tube noticed by RN when performing oral care, to be partially coiled in the back of the throat near pt's uvula. Pt has thick secretions and has been coughing consistently. Wanting to verify placement of Core Trek before continuing on with ordered continuous tube feedings.  KUB ordered per protocol.

## 2019-04-11 NOTE — Progress Notes (Signed)
Physical Therapy Treatment Patient Details Name: Richard Calhoun MRN: [id number] DOB: January 27, 1961 Today's Date: 04/11/2019    History of Present Illness 58 yo male admitted to ED on 9/10 for R flank pain, ETOH, N/V. Pt requiring intubation 9/13-9/18.Endoscopy reveals nonbleeding erosive gastropathy, duodenal erosions; placed feeding tube. PMH includes anxiety, alcohol abuse, depression, GERD, HTN, HLD, tubular adenoma of colon, CAD with stent placement, PVD.    PT Comments    Pt with improved sitting balance this session, able to sit EOB with UE and LE support without PT correction for ~2 minutes. Pt also able to prop on elbows and come back to sitting with min assist, so sitting balance is progressing well. Pt with coughing during session but unable to clear secretions. RN encouraged to utilize chair position in ICU bed to get pt more upright, RN agreeable pending pt's tolerance for it. VSS during session, PT to continue to follow acutely.    Follow Up Recommendations  SNF;Supervision/Assistance - 24 hour     Equipment Recommendations  Other (comment)(defer to next venue)    Recommendations for Other Services       Precautions / Restrictions Precautions Precautions: Fall Restrictions Weight Bearing Restrictions: No    Mobility  Bed Mobility Overal bed mobility: Needs Assistance Bed Mobility: Supine to Sit;Sit to Supine     Supine to sit: Total assist;+2 for physical assistance;+2 for safety/equipment Sit to supine: Max assist;+2 for physical assistance;+2 for safety/equipment;HOB elevated   General bed mobility comments: total assist +2 with helicopter pivot to EOB, assist for LE lifting, scooting to EOB with use of bed pads, and trunk elevation. Pt grimacing with mobility, passed quickly. Pt sat EOB with min guard for ~5 minutes with UE placement on tray table for support, initating return to elbow and attempting to lift his legs into bed when fatigued.  Transfers Overall  transfer level: (NT)                  Ambulation/Gait                 Stairs             Wheelchair Mobility    Modified Rankin (Stroke Patients Only)       Balance Overall balance assessment: Needs assistance Sitting-balance support: Bilateral upper extremity supported;Feet supported Sitting balance-Leahy Scale: Poor Sitting balance - Comments: sat EOB ~5 minutes with occasional min assist to correct balance. Pt's hands placed on tray table to keep pt sitting upright. Pt with L lateral lean and posterior leaning when fatigued, able to right self to come back to sitting. Postural control: Posterior lean Standing balance support: (NT)                                Cognition Arousal/Alertness: Awake/alert Behavior During Therapy: Flat affect Overall Cognitive Status: Impaired/Different from baseline Area of Impairment: Orientation;Attention;Following commands;Safety/judgement;Problem solving;Awareness                 Orientation Level: Disoriented to;Time;Situation;Place Current Attention Level: Sustained   Following Commands: Follows one step commands inconsistently;Follows one step commands with increased time Safety/Judgement: Decreased awareness of safety Awareness: Intellectual Problem Solving: Decreased initiation;Difficulty sequencing;Requires verbal cues;Requires tactile cues General Comments: Pt able to follow commands 50% of the time today, stating his legs hurt with mobility and he was done sitting EOB. Pt still not vocalizing, PT read pt lips.      Exercises  General Exercises - Lower Extremity Heel Slides: Both;Supine;10 reps;AAROM    General Comments General comments (skin integrity, edema, etc.): edematous UEs L>R and LEs. Pt with productive-sounding cough but pt did not produce secretions.      Pertinent Vitals/Pain Pain Assessment: Faces Faces Pain Scale: Hurts even more Pain Location: LE, with AAROM Pain  Descriptors / Indicators: Sore;Grimacing Pain Intervention(s): Limited activity within patient's tolerance;Monitored during session;Repositioned    Home Living                      Prior Function            PT Goals (current goals can now be found in the care plan section) Acute Rehab PT Goals PT Goal Formulation: Patient unable to participate in goal setting Time For Goal Achievement: 04/23/19 Potential to Achieve Goals: Fair Progress towards PT goals: Progressing toward goals    Frequency    Min 2X/week      PT Plan Current plan remains appropriate    Co-evaluation              AM-PAC PT "6 Clicks" Mobility   Outcome Measure  Help needed turning from your back to your side while in a flat bed without using bedrails?: Total Help needed moving from lying on your back to sitting on the side of a flat bed without using bedrails?: Total Help needed moving to and from a bed to a chair (including a wheelchair)?: Total Help needed standing up from a chair using your arms (e.g., wheelchair or bedside chair)?: Total Help needed to walk in hospital room?: Total Help needed climbing 3-5 steps with a railing? : Total 6 Click Score: 6    End of Session   Activity Tolerance: Patient limited by fatigue;Patient limited by lethargy Patient left: in bed;with call bell/phone within reach;with bed alarm set;with restraints reapplied(hand mittens replaced, tube feed re-started) Nurse Communication: Mobility status PT Visit Diagnosis: Muscle weakness (generalized) (M62.81);Other abnormalities of gait and mobility (R26.89)     Time: [id number] PT Time Calculation (min) (ACUTE ONLY): 18 min  Charges:  $Neuromuscular Re-education: 8-22 mins                     Richard Calhoun Richard Calhoun, PT Acute Rehabilitation Services Pager [phone]  Office [phone]   Richard Calhoun Richard Calhoun 04/11/2019, 3:04 PM

## 2019-04-11 NOTE — Progress Notes (Signed)
PROGRESS NOTE    Richard Calhoun  [id number] DOB: [birth year] DOA: 03/30/2019 PCP: Charlott Rakes, MD    Brief Narrative:  58 year old male who presented with hematemesis and abdominal pain.  He does have significant past medical history for chronic alcohol abuse, chronic tobacco abuse, coronary artery disease status post angioplasty, chronic heart failure, dyslipidemia, peripheral vascular disease and paroxysmal tachycardia.  He reported malaise and hematemesis.  He had poor oral intake for last 3 days prior to hospitalization.  He had worsening left flank pain, and unwitnessed fall, nausea and vomiting for several days.  On his initial physical examination his blood pressure was 109/52, heart rate 78, oxygen saturation 96%, rotation, heart S1-S2 present rhythmic, his abdomen was soft, left-sided hematoma, no lower extremity edema. Sodium 139, potassium 4.9, chloride 99, bicarb 13, glucose 86, BUN 72, creatinine 3.6, AST 183, ALT 73, total bilirubins 2.6, white count 8.9, hemoglobin 8.5, hematocrit 26.0, platelets 39.  SARS COVID-19 was negative.  CT abdomen with and no acute changes.  Patient was admitted to the hospital with a working diagnosis of hematemesis due to upper GI bleed.  Patient was medically treated with IV Ativan, IV thiamine, Librium and IV octreotide/IV pantoprazole.  He developed worsening mentation, failed noninvasive mechanical ventilation and was intubated September 13.  On September 16 patient underwent upper endoscopy showing esophageal varices and portal hypertensive gastropathy.  His mentation improved and he was liberated from mechanical ventilation on September 18.   After liberation from mechanical ventilation patient continued to be encephalopathic, showing withdrawal symptoms, he required dexmedetomidine infusion along with benzodiazepines per protocol.  He developed new onset atrial fibrillation with rapid ventricular response, his electrolytes have been abnormal,  and enteral nutrition was started by tube feeds on September 21.   Assessment & Plan:   Active Problems:   Anxiety state   EtOH dependence (HCC)   COLONIC POLYPS, HX OF   Infected sebaceous cyst of skin   Hypertensive heart disease without heart failure   Tachycardia   Chest pain on exertion   Acute hyperactive alcohol withdrawal delirium (HCC)   S/P drug eluting coronary stent placement   Upper GI bleed   Acute renal failure (HCC)   Alcoholic hepatitis without ascites   Flank pain   Tachypnea   Encephalopathy, hepatic (HCC)   Encounter for central line placement   Acute hypernatremia   Metabolic acidosis   Thrombocytopenia (HCC)   Left arm swelling   Hypomagnesemia   Chronic diastolic CHF (congestive heart failure) (HCC)   Macrocytic anemia   Atrial fibrillation, rapid ([place])   Acute hypoactive delirium due to another medical condition   1. Hypoactive delirium, (critical illness), hepatic encephalopathy. Patient last night pulled NG tube, this am continue to be using bilateral mittens, limited speech due to weakness, but he does follow simple commands. Tube feeds have been resumed. Will continue neuro checks and will add as needed morphine for pain/ agitation.   2. Atrial fibrillation with RVR. Heart rate 108 to 114, will continue rate control with AV blockade with metoprolol, continue telemetry monitoring, holding on anticoagulation due to recent GI bleed.   3. Alcoholic hepatitis, in the setting of hepatic steatosis/ cirrhosis, due to alcohol abuse. No frank asterixis, patient follows commands. Will continue to hold on lactulose for now, continue nutritional support with tube feedings, aspiration precautions. Will change thiamine to enteral. Check LFT in am.   4. Upper GI bleed with acute blood loss anemia sp one unit PRBC.  No further  signs of bleeding, will continue pantoprazole.   5. AKI with metabolic acidosis/ hypokalemia and hypomagnesemia. Renal function with  worsening serum cr at 1,21 with k at 2,8 and serum bicarbonate at 20. Will continue K correction with Kcl, hydration with balanced electrolyte solutions, follow with renal panel in am. Mg correction with Mag sulfate IV.   6. Thrombocytopenia. Platelets have been stable at 60, today clumped. Follow cell count while in the hospital.   7. Chronic diastolic heart failure/ coronary artery disease. Patient today with signs of hypovolemia, will hold on diuresis and continue volume repletion with IV fluids. Chest film personally reviewed with no infiltrates.   8. T2DM. Fasting glucose today 131, will continue glucose cover and monitoring with insulin sliding scale.   DVT prophylaxis: scd   Code Status:  full Family Communication: no family at the bedside  Disposition Plan/ discharge barriers: pending clinical improvement, will transfer to step down unit.   Body mass index is 29.04 kg/m. Malnutrition Type:  Nutrition Problem: Inadequate oral intake Etiology: inability to eat   Malnutrition Characteristics:  Signs/Symptoms: NPO status   Nutrition Interventions:  Interventions: Refer to RD note for recommendations  RN Pressure Injury Documentation:     Consultants:   GI   Procedures:   EGD   Antimicrobials:       Subjective: Patient is very weak and deconditioned, unable to talk due to weakness, but acknowledges being in pain. Last night pulled NG tube (not completely) this am with mittens.   Objective: Vitals:   04/11/19 0500 04/11/19 0600 04/11/19 0700 04/11/19 0800  BP: 126/64 (!) 132/55 (!) 151/70   Pulse: (!) 103 96 (!) 102   Resp: (!) 23 (!) 22 20   Temp:    98.9 F (37.2 C)  TempSrc:      SpO2: 100% 99% 98%   Weight:      Height:        Intake/Output Summary (Last 24 hours) at 04/11/2019 0839 Last data filed at 04/11/2019 0700 Gross per 24 hour  Intake 1802.12 ml  Output 2950 ml  Net -1147.88 ml   Filed Weights   03/30/19 1800 04/09/19 0958 04/11/19  0427  Weight: 88.9 kg 91.2 kg 91.8 kg    Examination:   General: deconditioned and ill looking appearing.  Neurology: Awake and alert, non focal, generalized weakness. Follows commands, not fully verbal.  E ENT: no pallor, no icterus, oral mucosa dry. NG tube in place.  Cardiovascular: No JVD. S1-S2 present, rhythmic, no gallops, rubs, or murmurs. No lower extremity edema. Pulmonary: positive breath sounds bilaterally, adequate air movement, no wheezing, rhonchi or rales. Proximal airway rhonchi.  Gastrointestinal. Abdomen with no organomegaly, non tender, no rebound or guarding Skin. No rashes Musculoskeletal: no joint deformities Rectal tube with dark green watery stool.     Data Reviewed: I have personally reviewed following labs and imaging studies  CBC: Recent Labs  Lab 04/07/19 0500 04/08/19 0330 04/09/19 0433 04/09/19 1800 04/10/19 0526 04/11/19 0415  WBC 10.6* 12.2* 12.4*  --  14.2* 11.0*  HGB 8.3* 8.0* 7.0* 8.6* 8.3* 8.0*  HCT 26.4* 24.7* 21.8* 25.6* 25.1* 24.8*  MCV 120.5* 115.4* 116.0*  --  113.6* 114.8*  PLT PLATELET CLUMPS NOTED ON SMEAR, UNABLE TO ESTIMATE 82* 62*  --  PLATELET CLUMPS NOTED ON SMEAR, COUNT APPEARS DECREASED PLATELET CLUMPS NOTED ON SMEAR, COUNT APPEARS DECREASED   Basic Metabolic Panel: Recent Labs  Lab 04/09/19 0433 04/09/19 1800 04/10/19 0526 04/10/19 1500 04/10/19 1618 04/11/19  0415  NA 140 143 143 144  --  147*  K 2.3* 3.0* 2.8* 4.7  --  2.8*  CL 108 111 112* 118*  --  117*  CO2 21* 21* 21* 18*  --  20*  GLUCOSE 443* 166* 155* 265*  --  131*  BUN 22* 24* 24* 20  --  25*  CREATININE 1.24 1.19 1.12 0.98  --  1.21  CALCIUM 7.0* 7.4* 7.2* 6.6*  --  7.6*  MG 1.5* 2.5* 2.2  --  1.8 1.7  PHOS 2.1* 2.6 3.1  --  2.4* 3.4   GFR: Estimated Creatinine Clearance: 73.9 mL/min (by C-G formula based on SCr of 1.21 mg/dL). Liver Function Tests: Recent Labs  Lab 04/07/19 0500 04/08/19 0330 04/09/19 0433 04/10/19 0526 04/11/19 0415  AST  84* 75* 55* 48* 58*  ALT 56* 50* 38 34 34  ALKPHOS 275* 277* 220* 196* 201*  BILITOT 2.5* 1.8* 1.4* 2.2* 2.1*  PROT 6.6 6.3* 5.8* 6.3* 6.0*  ALBUMIN 2.3* 2.1* 2.0* 2.1* 2.0*   No results for input(s): LIPASE, AMYLASE in the last 168 hours. Recent Labs  Lab 04/09/19 1612  AMMONIA 16   Coagulation Profile: Recent Labs  Lab 04/07/19 0500 04/08/19 0330 04/09/19 0433 04/10/19 0526 04/11/19 0415  INR 1.5* 1.5* 1.6* 1.6* 1.6*   Cardiac Enzymes: No results for input(s): CKTOTAL, CKMB, CKMBINDEX, TROPONINI in the last 168 hours. BNP (last 3 results) No results for input(s): PROBNP in the last 8760 hours. HbA1C: No results for input(s): HGBA1C in the last 72 hours. CBG: Recent Labs  Lab 04/10/19 1632 04/10/19 1929 04/10/19 2324 04/11/19 0324 04/11/19 0725  GLUCAP 102* 159* 101* 120* 131*   Lipid Profile: No results for input(s): CHOL, HDL, LDLCALC, TRIG, CHOLHDL, LDLDIRECT in the last 72 hours. Thyroid Function Tests: Recent Labs    04/09/19 1010  TSH 0.806   Anemia Panel: No results for input(s): VITAMINB12, FOLATE, FERRITIN, TIBC, IRON, RETICCTPCT in the last 72 hours.    Radiology Studies: I have reviewed all of the imaging during this hospital visit personally     Scheduled Meds: . sodium chloride   Intravenous Once  . sodium chloride   Intravenous Once  . Chlorhexidine Gluconate Cloth  6 each Topical Daily  . feeding supplement (PRO-STAT SUGAR FREE 64)  30 mL Per Tube Daily  . folic acid  1 mg Intravenous Daily  . furosemide  40 mg Intravenous BID  . hydrocortisone  25 mg Rectal QHS  . insulin aspart  0-15 Units Subcutaneous Q4H  . lactulose  300 mL Rectal Daily  . mouth rinse  15 mL Mouth Rinse BID  . pantoprazole (PROTONIX) IV  40 mg Intravenous Q12H  . sodium chloride flush  10-40 mL Intracatheter Q12H  . thiamine injection  100 mg Intravenous Daily   Continuous Infusions: . sodium chloride 250 mL (04/06/19 1827)  . feeding supplement (OSMOLITE  1.2 CAL) 35 mL/hr at 04/11/19 0521  . potassium chloride 10 mEq (04/11/19 0731)     LOS: 12 days        Oluwatobiloba Martin Gerome Apley, MD

## 2019-04-11 NOTE — Progress Notes (Addendum)
Pt pulled panda tube partially out. Tube feedings stopped and charge RN advanced tube. Chest x-ray ordered to verify placement. Hand mittens placed on pt to prevent dislodging of the tube from pulling.   CXR confirming tip of panda tube is in mid-duodenum. Will restart tube feedings and keep hand mittens on pt to prevent him from pulling the tube.

## 2019-04-12 DIAGNOSIS — E876 Hypokalemia: Secondary | ICD-10-CM

## 2019-04-12 LAB — BASIC METABOLIC PANEL
Anion gap: 9 (ref 5–15)
BUN: 28 mg/dL — ABNORMAL HIGH (ref 6–20)
CO2: 24 mmol/L (ref 22–32)
Calcium: 7.5 mg/dL — ABNORMAL LOW (ref 8.9–10.3)
Chloride: 111 mmol/L (ref 98–111)
Creatinine, Ser: 1.21 mg/dL (ref 0.61–1.24)
GFR calc Af Amer: >60 mL/min (ref >60)
GFR calc non Af Amer: >60 mL/min (ref >60)
Glucose, Bld: 148 mg/dL — ABNORMAL HIGH (ref 70–99)
Potassium: 2.9 mmol/L — ABNORMAL LOW (ref 3.5–5.1)
Sodium: 144 mmol/L (ref 135–145)

## 2019-04-12 LAB — GLUCOSE, CAPILLARY
Glucose-Capillary: 116 mg/dL — ABNORMAL HIGH (ref 70–99)
Glucose-Capillary: 147 mg/dL — ABNORMAL HIGH (ref 70–99)
Glucose-Capillary: 117 mg/dL — ABNORMAL HIGH (ref 70–99)
Glucose-Capillary: 154 mg/dL — ABNORMAL HIGH (ref 70–99)
Glucose-Capillary: 134 mg/dL — ABNORMAL HIGH (ref 70–99)

## 2019-04-12 LAB — HEPATIC FUNCTION PANEL
ALT: 31 U/L (ref 0–44)
AST: 53 U/L — ABNORMAL HIGH (ref 15–41)
Albumin: 2 g/dL — ABNORMAL LOW (ref 3.5–5.0)
Alkaline Phosphatase: 212 U/L — ABNORMAL HIGH (ref 38–126)
Bilirubin, Direct: 0.9 mg/dL — ABNORMAL HIGH (ref 0.0–0.2)
Indirect Bilirubin: 1.3 mg/dL — ABNORMAL HIGH (ref 0.3–0.9)
Total Bilirubin: 2.2 mg/dL — ABNORMAL HIGH (ref 0.3–1.2)
Total Protein: 6.1 g/dL — ABNORMAL LOW (ref 6.5–8.1)

## 2019-04-12 LAB — MAGNESIUM: Magnesium: 1.9 mg/dL (ref 1.7–2.4)

## 2019-04-12 MED ORDER — MAGNESIUM SULFATE 2 GM/50ML IV SOLN
2.0000 g | Freq: Once | INTRAVENOUS | Status: AC
  Administered 2019-04-12: 2 g via INTRAVENOUS
  Filled 2019-04-12: qty 50

## 2019-04-12 MED ORDER — POTASSIUM CHLORIDE 10 MEQ/50ML IV SOLN
10.0000 meq | INTRAVENOUS | Status: AC
  Administered 2019-04-12 (×4): 10 meq via INTRAVENOUS
  Filled 2019-04-12 (×4): qty 50

## 2019-04-12 MED ORDER — POTASSIUM CHLORIDE 20 MEQ PO PACK
40.0000 meq | PACK | ORAL | Status: AC
  Administered 2019-04-12 (×2): 40 meq via ORAL
  Filled 2019-04-12 (×2): qty 2

## 2019-04-12 NOTE — Progress Notes (Signed)
PROGRESS NOTE    Richard Calhoun  [id number] DOB: [birth year] DOA: 03/30/2019 PCP: Charlott Rakes, MD    Brief Narrative:  58 year old male who presented with hematemesis and abdominal pain.  He does have significant past medical history for chronic alcohol abuse, chronic tobacco abuse, coronary artery disease status post angioplasty, chronic heart failure, dyslipidemia, peripheral vascular disease and paroxysmal tachycardia.  He reported malaise and hematemesis.  He had poor oral intake for last 3 days prior to hospitalization.  He had worsening left flank pain, and unwitnessed fall, nausea and vomiting for several days.  On his initial physical examination his blood pressure was 109/52, heart rate 78, oxygen saturation 96%, rotation, heart S1-S2 present rhythmic, his abdomen was soft, left-sided hematoma, no lower extremity edema. Sodium 139, potassium 4.9, chloride 99, bicarb 13, glucose 86, BUN 72, creatinine 3.6, AST 183, ALT 73, total bilirubins 2.6, white count 8.9, hemoglobin 8.5, hematocrit 26.0, platelets 39.  SARS COVID-19 was negative.  CT abdomen with and no acute changes.  Patient was admitted to the hospital with a working diagnosis of hematemesis due to upper GI bleed.  Patient was medically treated with IV Ativan, IV thiamine, Librium and IV octreotide/IV pantoprazole.  He developed worsening mentation, failed noninvasive mechanical ventilation and was intubated September 13.  On September 16 patient underwent upper endoscopy showing esophageal varices and portal hypertensive gastropathy.  His mentation improved and he was liberated from mechanical ventilation on September 18.   After liberation from mechanical ventilation patient continued to be encephalopathic, showing withdrawal symptoms, he required dexmedetomidine infusion along with benzodiazepines per protocol.  He developed new onset atrial fibrillation with rapid ventricular response, his electrolytes have been  abnormal, and enteral nutrition was started by tube feeds on September 21.  Patient continue to be hypoactive and lethargic, but following commands, very weak and deconditioned.   Assessment & Plan:   Active Problems:   Anxiety state   EtOH dependence (HCC)   COLONIC POLYPS, HX OF   Infected sebaceous cyst of skin   Hypertensive heart disease without heart failure   Tachycardia   Chest pain on exertion   Acute hyperactive alcohol withdrawal delirium (HCC)   S/P drug eluting coronary stent placement   Upper GI bleed   Acute renal failure (HCC)   Alcoholic hepatitis without ascites   Flank pain   Tachypnea   Encephalopathy, hepatic (HCC)   Encounter for central line placement   Acute hypernatremia   Metabolic acidosis   Thrombocytopenia (HCC)   Left arm swelling   Hypomagnesemia   Chronic diastolic CHF (congestive heart failure) (HCC)   Macrocytic anemia   Atrial fibrillation, rapid ([place])   Acute hypoactive delirium due to another medical condition    1. Hypoactive delirium, (critical illness), hepatic encephalopathy. Today's Glasgow coma scale is 12. Currently off anxiolytics or antipsychotics, continue to be lethargic and somnolent. Tolerated well tube feedings. Will ask speech for evaluation. Continue aspiration precautions. Continue as needed morphine for pain. Continue neuro checks per unit protocol.    2. Atrial fibrillation with RVR. Personally reviewed telemetry with sinus tachycardia 102 to 106, will continue metoprolol for rate control, holding on anticoagulation due to recent GI bleed. continue telemetry monitoring. Echo from 2018 (personally reviewed old records) with preserved LV systolic function.   3. Alcoholic hepatitis, in the setting of hepatic steatosis/ cirrhosis, due to alcohol abuse. AST 53, ALT 31, T Bil 2.2. INR 1.6. Patient with encephaopathy, but no features of hepatic in origin. Continue with  thiamine   4. Upper GI bleed with acute blood loss  anemia sp one unit PRBC.  Stable Hgb and hct, will continue antiacid therapy with pantoprazole. Tolerating well tube feedings.  5. AKI with metabolic acidosis/ hypokalemia and hypomagnesemia. K at 2,9 with serum bicarbonate at 24, Mg 1,9 and serum cr at 1,21. Patient with positive diarrhea 225 ml over last 24 H. Urine output is 1,400 ml over last 24 H. Will continue K correction with Kcl 40 IV and 80 enteral, 2 grams of Magnesium sulfate. Will continue to hold on furosemide for now, edema likely due to hypoproteinemia.   6. Thrombocytopenia. likely due to liver disease, will continue close monitoring.   7. Chronic diastolic heart failure/ coronary artery disease (2018 preserved LV systolic function). Continue metoprolol for rate control, continue to hold on furosemide for now.   8. T2DM. This am glucose is 148, capillary has been 126, 111, 116, 147. On insulin sliding scale for now.   DVT prophylaxis: scd   Code Status:  full Family Communication: no family at the bedside  Disposition Plan/ discharge barriers: pending clinical improvement. Will keep in progressive care unit for now due to decreased mentation.    Body mass index is 28.09 kg/m. Malnutrition Type:  Nutrition Problem: Inadequate oral intake Etiology: inability to eat   Malnutrition Characteristics:  Signs/Symptoms: NPO status   Nutrition Interventions:  Interventions: Refer to RD note for recommendations  RN Pressure Injury Documentation:     Consultants:     Procedures:     Antimicrobials:       Subjective: Patient continue to be somnolent and lethargic, tolerating well tube feedings, follows commands but not able to talk. No vomiting.   Objective: Vitals:   04/12/19 0500 04/12/19 0600 04/12/19 0700 04/12/19 0800  BP: 137/62 (!) 117/35 (!) 114/37 120/65  Pulse: (!) 105 (!) 101 (!) 102 (!) 106  Resp: 18 (!) 22 20 (!) 28  Temp:      TempSrc:      SpO2: 96% 98% 92% 95%  Weight: 88.8 kg      Height:        Intake/Output Summary (Last 24 hours) at 04/12/2019 0822 Last data filed at 04/12/2019 0600 Gross per 24 hour  Intake 1214.94 ml  Output 1625 ml  Net -410.06 ml   Filed Weights   04/09/19 0958 04/11/19 0427 04/12/19 0500  Weight: 91.2 kg 91.8 kg 88.8 kg    Examination:   General: deconditioned and ill looking appearing.  Neurology: somnolent and lethargic, incomprehensive sounds, follows commands, opens eyes spontaneously  E ENT: mild pallor, no icterus, oral mucosa dry.  Cardiovascular: No JVD. S1-S2 present, rhythmic, no gallops, rubs, or murmurs. Pedal +++ pitting edema bilaterally. Pulmonary: positive breath sounds bilaterally,  no wheezing, positive proximal airway rhonchi with no rales. Gastrointestinal. Abdomen distended with no organomegaly, non tender, no rebound or guarding Skin. No rashes Musculoskeletal: no joint deformities     Data Reviewed: I have personally reviewed following labs and imaging studies  CBC: Recent Labs  Lab 04/07/19 0500 04/08/19 0330 04/09/19 0433 04/09/19 1800 04/10/19 0526 04/11/19 0415  WBC 10.6* 12.2* 12.4*  --  14.2* 11.0*  HGB 8.3* 8.0* 7.0* 8.6* 8.3* 8.0*  HCT 26.4* 24.7* 21.8* 25.6* 25.1* 24.8*  MCV 120.5* 115.4* 116.0*  --  113.6* 114.8*  PLT PLATELET CLUMPS NOTED ON SMEAR, UNABLE TO ESTIMATE 82* 62*  --  PLATELET CLUMPS NOTED ON SMEAR, COUNT APPEARS DECREASED PLATELET CLUMPS NOTED ON SMEAR, COUNT  APPEARS DECREASED   Basic Metabolic Panel: Recent Labs  Lab 04/09/19 0433 04/09/19 1800 04/10/19 0526 04/10/19 1500 04/10/19 1618 04/11/19 0415 04/12/19 0324  NA 140 143 143 144  --  147* 144  K 2.3* 3.0* 2.8* 4.7  --  2.8* 2.9*  CL 108 111 112* 118*  --  117* 111  CO2 21* 21* 21* 18*  --  20* 24  GLUCOSE 443* 166* 155* 265*  --  131* 148*  BUN 22* 24* 24* 20  --  25* 28*  CREATININE 1.24 1.19 1.12 0.98  --  1.21 1.21  CALCIUM 7.0* 7.4* 7.2* 6.6*  --  7.6* 7.5*  MG 1.5* 2.5* 2.2  --  1.8 1.7 1.9  PHOS  2.1* 2.6 3.1  --  2.4* 3.4  --    GFR: Estimated Creatinine Clearance: 72.8 mL/min (by C-G formula based on SCr of 1.21 mg/dL). Liver Function Tests: Recent Labs  Lab 04/08/19 0330 04/09/19 0433 04/10/19 0526 04/11/19 0415 04/12/19 0324  AST 75* 55* 48* 58* 53*  ALT 50* 38 34 34 31  ALKPHOS 277* 220* 196* 201* 212*  BILITOT 1.8* 1.4* 2.2* 2.1* 2.2*  PROT 6.3* 5.8* 6.3* 6.0* 6.1*  ALBUMIN 2.1* 2.0* 2.1* 2.0* 2.0*   No results for input(s): LIPASE, AMYLASE in the last 168 hours. Recent Labs  Lab 04/09/19 1612  AMMONIA 16   Coagulation Profile: Recent Labs  Lab 04/07/19 0500 04/08/19 0330 04/09/19 0433 04/10/19 0526 04/11/19 0415  INR 1.5* 1.5* 1.6* 1.6* 1.6*   Cardiac Enzymes: No results for input(s): CKTOTAL, CKMB, CKMBINDEX, TROPONINI in the last 168 hours. BNP (last 3 results) No results for input(s): PROBNP in the last 8760 hours. HbA1C: No results for input(s): HGBA1C in the last 72 hours. CBG: Recent Labs  Lab 04/11/19 1530 04/11/19 1939 04/11/19 2334 04/12/19 0312 04/12/19 0745  GLUCAP 130* 126* 111* 116* 147*   Lipid Profile: No results for input(s): CHOL, HDL, LDLCALC, TRIG, CHOLHDL, LDLDIRECT in the last 72 hours. Thyroid Function Tests: Recent Labs    04/09/19 1010  TSH 0.806   Anemia Panel: No results for input(s): VITAMINB12, FOLATE, FERRITIN, TIBC, IRON, RETICCTPCT in the last 72 hours.    Radiology Studies: I have reviewed all of the imaging during this hospital visit personally     Scheduled Meds: . sodium chloride   Intravenous Once  . sodium chloride   Intravenous Once  . Chlorhexidine Gluconate Cloth  6 each Topical Daily  . feeding supplement (PRO-STAT SUGAR FREE 64)  30 mL Per Tube Daily  . folic acid  1 mg Intravenous Daily  . hydrocortisone  25 mg Rectal QHS  . insulin aspart  0-15 Units Subcutaneous Q4H  . mouth rinse  15 mL Mouth Rinse BID  . metoprolol tartrate  25 mg Oral BID  . pantoprazole sodium  40 mg Per  Tube BID  . sodium chloride flush  10-40 mL Intracatheter Q12H  . thiamine  100 mg Per NG tube Daily   Continuous Infusions: . sodium chloride 250 mL (04/06/19 1827)  . feeding supplement (OSMOLITE 1.2 CAL) 55 mL/hr at 04/12/19 0733  . potassium chloride Stopped (04/12/19 0907)     LOS: 13 days        Mauricio Gerome Apley, MD

## 2019-04-12 NOTE — Evaluation (Addendum)
Clinical/Bedside Swallow Evaluation Patient Details  Name: Richard Calhoun MRN: [id number] Date of Birth: 1961-02-23  Today's Date: 04/12/2019 Time: SLP Start Time (ACUTE ONLY): 1330 SLP Stop Time (ACUTE ONLY): 1355 SLP Time Calculation (min) (ACUTE ONLY): 25 min  Past Medical History:  Past Medical History:  Diagnosis Date  . Acute renal failure ([name])   . Alcoholic hepatitis without ascites   . ALLERGIC RHINITIS   . Anemia   . Anginal pain ([name])   . ANXIETY   . BICEPS TENDON RUPTURE, RIGHT   . CAD S/P percutaneous coronary angioplasty 03/2017   Cath: mRCA 99-100% CTO (calcific w/ L-R collaterals), p-mLAD calcified 60-80% (FFR 0.74 after D2), 50% mCx. --> Staged Atherectomy-DES PCI of p-mLAD (Synergy DES 3.0 x 24 -- 3.3 mm)  . Chronic diastolic CHF (congestive heart failure) ([name]) 04/07/2019  . DEPENDENCE, ALCOHOL NEC/NOS, UNSPECIFIED   . DEPRESSION   . GERD   . Headache   . HYPERLIPIDEMIA   . HYPERTENSION   . Impaired glucose tolerance   . OSTEOARTHRITIS   . Tubular adenoma of colon 06/2006   Past Surgical History:  Past Surgical History:  Procedure Laterality Date  . COLONOSCOPY W/ POLYPECTOMY  06/2006   Dr Fuller Plan.  For hematochezia. 5 mm, sessile polyp (tubular adenoma, no HGD).  injected saline to internal hemorrhoids.  Did not respond to fup 2012, 2014 colonoscopy.    . CORONARY ANGIOPLASTY    . CORONARY ATHERECTOMY N/A 04/20/2017   Wellington Hampshire, MD;  p-mLAD (Orbital)  . CORONARY STENT INTERVENTION N/A 04/20/2017   Wellington Hampshire, MD;  [facility].  Staged Orbital Atherectomy-DES PCI of p-mLAD (Synergy DES 3.0 x 24 -- 3.3 mm)  . ESOPHAGOGASTRODUODENOSCOPY  2000   Dr Laurence Spates.  Hiatal hernia.  Marland Kitchen ESOPHAGOGASTRODUODENOSCOPY (EGD) WITH PROPOFOL N/A 04/04/2019   Procedure: ESOPHAGOGASTRODUODENOSCOPY (EGD) WITH PROPOFOL;  Surgeon: Rush Landmark Telford Nab., MD;  Location: [facility];  Service: Gastroenterology;  Laterality: N/A;  bedside-on vent  . FOOT SURGERY    .  INTRAVASCULAR PRESSURE WIRE/FFR STUDY N/A 04/15/2017   Procedure: INTRAVASCULAR PRESSURE WIRE/FFR STUDY; Belva Crome, MD; LAD = 0.74 after D2  . LEFT HEART CATH AND CORONARY ANGIOGRAPHY N/A 04/15/2017  . MANDIBLE FRACTURE SURGERY    . TRANSTHORACIC ECHOCARDIOGRAM  01/2017   Moderate LVH.  EF 60 to 65%.  No R WMA.  GR 1 DD. Mild AI.    HPI:  58 y.o. year old male admitted 03/30/2019 with reports of worsening left flank pain, unwitnessed fall, nausea and vomiting for several days and episode of hematemesis, concern for upper GI bleed causing AKI. PMH: chronic alcohol abuse, CAD s/p DES (2018), CHFpEF, HLD. Pt intubated 9/13-18/2020   Assessment / Plan / Recommendation Clinical Impression  Pt seen at bedside for assessment of po readiness. RN reports open mouth breathing, and consequent significant xerostomia. Oral cavity noted to have secretions caked on lingual surface and hard palate. Palatal material removed with suction. Pt exhibits aphonic/wet voice quality, and was unable to demonstrate adequate voicing. Pt requires max multimodal cues to generate volitional cough to facilitate removal of some secretions from airway.  Pt attempts to communicate verbally, however, rapid rate of speech and lack of voicing make intelligibility significantly poor. He was unable to modify rate of speech to improve intelligibility.    Pt is alert but profoundly weak, and is currently exhibiting poor secretion management. No po trials were given today, due to high aspiration risk. RN informed of results and recommendations.  SLP will continue to follow to assess readiness for po intake/instrumental study.    SLP Visit Diagnosis: Dysphagia, unspecified (R13.10)    Aspiration Risk  Severe aspiration risk    Diet Recommendation NPO   Medication Administration: Via alternative means    Other  Recommendations Oral Care Recommendations: Oral care QID;Staff/trained caregiver to provide oral care   Follow up  Recommendations (TBD)      Frequency and Duration min 2x/week  2 weeks       Prognosis Prognosis for Safe Diet Advancement: Fair Barriers to Reach Goals: Cognitive deficits;Severity of deficits      Swallow Study   General Date of Onset: 03/30/19 HPI: 58 y.o. year old male admitted 03/30/2019 with reports of worsening left flank pain, unwitnessed fall, nausea and vomiting for several days and episode of hematemesis, concern for upper GI bleed causing AKI. PMH: chronic alcohol abuse, CAD s/p DES (2018), CHFpEF, HLD. Pt intubated 9/13-18/2020 Type of Study: Bedside Swallow Evaluation Previous Swallow Assessment: no prior ST intervention Diet Prior to this Study: NPO;Other (Comment)(cortrak) Temperature Spikes Noted: No Respiratory Status: Room air History of Recent Intubation: Yes Length of Intubations (days): 5 days Date extubated: 04/06/19 Behavior/Cognition: Alert Oral Cavity Assessment: Dried secretions;Excessive secretions Oral Care Completed by SLP: Yes Oral Cavity - Dentition: Adequate natural dentition Baseline Vocal Quality: Aphonic Volitional Cough: Congested;Wet;Weak Volitional Swallow: Unable to elicit    Oral/Motor/Sensory Function Overall Oral Motor/Sensory Function: (unable to assess)   Ice Chips Ice chips: Not tested   Thin Liquid Thin Liquid: Not tested    Nectar Thick Nectar Thick Liquid: Not tested   Honey Thick Honey Thick Liquid: Not tested   Puree Puree: Not tested   Solid     Solid: Not tested     Richard Calhoun [facility], [place] Speech Language Pathologist [phone]  Richard Calhoun 04/12/2019,2:01 PM

## 2019-04-12 NOTE — Progress Notes (Signed)
Nutrition Follow-up  DOCUMENTATION CODES:   Not applicable  INTERVENTION:  - continue Osmolite 1.2 @ 55 ml/hr and advance to 65 ml/hr tomorrow at 10 AM. - continue 30 ml prostat once/day. - free water flush, if desired, to be per MD.   Monitor magnesium, potassium, and phosphorus daily for at least 3 days, MD to replete as needed, as pt is at risk for refeeding syndrome given current hypokalemia.    NUTRITION DIAGNOSIS:   Inadequate oral intake related to inability to eat as evidenced by NPO status. -ongoing  GOAL:   Patient will meet greater than or equal to 90% of their needs -will be met with TF at goal rate  MONITOR:   TF tolerance, Labs, Weight trends  ASSESSMENT:   58 year-old male with medical history of chronic alcohol abuse (half gallon of rum/day), CAD and PCI, unstable angina with stent placement in 2018, CHF, hyperlipidemia, mild peripheral vascular disease, and tachycardia. Patient presented to the ED on 9/11 d/t increasing malaise and increased sleeping duration x5 days, vomiting and poor PO intakes x3 days.  Significant Events: 9/11- admission 9/13- intubation; OGT placement 9/14- initial RD assessment 9/16- OGT removed and replaced 9/18- extubation and OGT removed 9/19- diet advanced from NPO to [place] 9/20- returned to NPO 9/22- NGT placement; TF initiation    Patient sleeping at this time and flow sheet indicates that he is a/o to self only. Patient sounds very "gurgly" when breathing. Able to talk with RN at bedside. She reports that this has been the case since extubation (9/18) and that patient requires frequent suctioning d/t increased secretions. Last suctioning was ~30 minutes ago. RN reports that patient is able to cough up mucus to the top of this esophagus and then needs staff to suction.   Patient has small bore NGT and is currently receiving Osmolite 1.2 @ 55 ml/hr with 30 ml prostat once/day. This regimen is providing 1684 kcal (89% estimated  kcal need), 88 grams protein (93% estimated protein need), and 1082 ml free water.   Goal rate for TF is Osmolite 1.2 @ 65 ml/hr with 30 ml prostat once/day which will provide 1972 kcal, 102 grams protein, and 1279 ml free water. Current weight is consistent with admission weight.      Labs reviewed; CBGs: 116 and 147 mg/dl, K: 2.9 mmol/l with Mg and Phos WDL,  BUN: 28 mg/dl, Ca: 7.5 mg/dl, Alk Phos elevated.  Medications reviewed; 1 mg IV folic acid/day, sliding scale novolog, 2 g IV Mg sulfate x1 run 9/23 and x1 run 9/24, 40 mg protonix per tube BID, 40 mEq Klor-Con per tube x2 doses 9/24, 10 mEq IV KCl x4 runs 9/23 and x4 runs 9/24, 100 mg thiamine per tube/day.    Diet Order:   Diet Order            Diet NPO time specified  Diet effective now              EDUCATION NEEDS:   No education needs have been identified at this time  Skin:  Skin Assessment: Reviewed RN Assessment  Last BM:  9/23  Height:   Ht Readings from Last 1 Encounters:  03/30/19 '5\' 10"'$  (1.778 m)    Weight:   Wt Readings from Last 1 Encounters:  04/12/19 88.8 kg    Ideal Body Weight:  75.4 kg  BMI:  Body mass index is 28.09 kg/m.  Estimated Nutritional Needs:   Kcal:  1900-2100 kcal  Protein:  95-105  grams  Fluid:  >/= 2 L/day      Jarome Matin, MS, RD, LDN, [facility] Inpatient Clinical Dietitian Pager # [phone] After hours/weekend pager # [phone]

## 2019-04-13 LAB — GLUCOSE, CAPILLARY
Glucose-Capillary: 117 mg/dL — ABNORMAL HIGH (ref 70–99)
Glucose-Capillary: 126 mg/dL — ABNORMAL HIGH (ref 70–99)
Glucose-Capillary: 130 mg/dL — ABNORMAL HIGH (ref 70–99)
Glucose-Capillary: 132 mg/dL — ABNORMAL HIGH (ref 70–99)
Glucose-Capillary: 133 mg/dL — ABNORMAL HIGH (ref 70–99)
Glucose-Capillary: 147 mg/dL — ABNORMAL HIGH (ref 70–99)
Glucose-Capillary: 147 mg/dL — ABNORMAL HIGH (ref 70–99)

## 2019-04-13 LAB — BASIC METABOLIC PANEL
Anion gap: 9 (ref 5–15)
BUN: 27 mg/dL — ABNORMAL HIGH (ref 6–20)
CO2: 23 mmol/L (ref 22–32)
Calcium: 8.2 mg/dL — ABNORMAL LOW (ref 8.9–10.3)
Chloride: 122 mmol/L — ABNORMAL HIGH (ref 98–111)
Creatinine, Ser: 1.05 mg/dL (ref 0.61–1.24)
GFR calc Af Amer: >60 mL/min (ref >60)
GFR calc non Af Amer: >60 mL/min (ref >60)
Glucose, Bld: 133 mg/dL — ABNORMAL HIGH (ref 70–99)
Potassium: 3.9 mmol/L (ref 3.5–5.1)
Sodium: 154 mmol/L — ABNORMAL HIGH (ref 135–145)

## 2019-04-13 LAB — MAGNESIUM: Magnesium: 1.9 mg/dL (ref 1.7–2.4)

## 2019-04-13 MED ORDER — ADULT MULTIVITAMIN LIQUID CH
15.0000 mL | Freq: Every day | ORAL | Status: DC
  Administered 2019-04-13 – 2019-04-14 (×2): 15 mL
  Filled 2019-04-13 (×2): qty 15

## 2019-04-13 MED ORDER — FREE WATER
250.0000 mL | Freq: Four times a day (QID) | Status: DC
  Administered 2019-04-13 – 2019-04-14 (×6): 250 mL

## 2019-04-13 MED ORDER — FOLIC ACID 1 MG PO TABS
1.0000 mg | ORAL_TABLET | Freq: Every day | ORAL | Status: DC
  Administered 2019-04-13 – 2019-04-14 (×2): 1 mg
  Filled 2019-04-13 (×2): qty 1

## 2019-04-13 MED ORDER — SODIUM CHLORIDE 0.45 % IV SOLN
INTRAVENOUS | Status: DC
  Administered 2019-04-13: 07:00:00 via INTRAVENOUS

## 2019-04-13 NOTE — Progress Notes (Signed)
PROGRESS NOTE    Richard Calhoun  [id number] DOB: Jan 18, 1961 DOA: 03/30/2019 PCP: Charlott Rakes, MD    Brief Narrative:  58 year old male who presented with hematemesis and abdominal pain. He does have significant past medical history for chronic alcohol abuse, chronic tobacco abuse, coronary artery disease status post angioplasty,chronic heart failure, dyslipidemia, peripheral vascular disease and paroxysmal tachycardia. He reported malaise and hematemesis.He had poor oral intake for last 3 days prior to hospitalization. He had worsening left flank pain, and unwitnessed fall, nausea and vomiting for several days. On his initial physical examination his blood pressure was 109/52, heart rate 78, oxygen saturation 96%, rotation, heart S1-S2 present rhythmic, his abdomen was soft, left-sided hematoma,no lower extremity edema. Sodium 139, potassium 4.9, chloride 99, bicarb 13, glucose 86, BUN 72, creatinine 3.6, AST 183, ALT 73, total bilirubins 2.6,white count 8.9, hemoglobin 8.5, hematocrit 26.0, platelets 39.SARS COVID-19 was negative. CT abdomen with and no acute changes.  Patient was admitted to the hospital with a working diagnosis of hematemesis due to upper GI bleed.  Patient was medically treated with IV Ativan, IV thiamine, Librium and IV octreotide/IV pantoprazole. He developed worsening mentation, failed noninvasive mechanical ventilation and was intubated September 13.  On September 16 patient underwent upper endoscopy showing esophageal varices and portal hypertensive gastropathy. His mentation improved and he was liberated from mechanical ventilation on September 18.  After liberation from mechanical ventilation patient continued to be encephalopathic, showing withdrawal symptoms, he required dexmedetomidine infusion along with benzodiazepines per protocol. He developed new onset atrial fibrillation with rapid ventricular response, his electrolytes have been  abnormal,and enteral nutrition was started by tube feeds on September 21.  Patient continue to be hypoactive and lethargic, but following commands, very weak and deconditioned.   Patient continue to be npo due to aspiration risk.    Assessment & Plan:   Active Problems:   Anxiety state   EtOH dependence (HCC)   COLONIC POLYPS, HX OF   Infected sebaceous cyst of skin   Hypertensive heart disease without heart failure   Tachycardia   Chest pain on exertion   Acute hyperactive alcohol withdrawal delirium (HCC)   S/P drug eluting coronary stent placement   Upper GI bleed   Acute renal failure (HCC)   Alcoholic hepatitis without ascites   Flank pain   Tachypnea   Encephalopathy, hepatic (HCC)   Encounter for central line placement   Acute hypernatremia   Metabolic acidosis   Thrombocytopenia (HCC)   Left arm swelling   Hypomagnesemia   Chronic diastolic CHF (congestive heart failure) (HCC)   Macrocytic anemia   Atrial fibrillation, rapid ([place])   Acute hypoactive delirium due to another medical condition     1. Hypoactive delirium, (critical illness), hepatic encephalopathy. Today's Glasgow coma scale is 14, he responds to questions with yes and no, continue to follow commands.He has failed swallow evaluation. Continues to be very weak and deconditioned, not focal. Will continue enteral nutrition per NG for now, continue aspiration precautions. Continue folic acid, multivitamins and thiamine.   2. Atrial fibrillation with RVR/ 2018 preserved LV systolic function. Patient continue sinus rhythm on telemetry, continue metoprolol for rate control, holding anticoagulation for now. Will transfer patient to telemetry.   3. Alcoholic hepatitis, in the setting of hepatic steatosis/ cirrhosis, due to alcohol abuse.patient with no asterixis, continue to be very weak but follows commands, positive diarrhea, will hold on lactulose for now. Continue neuro checks per unit protocol.  4.  Upper GI bleed with  acute blood loss anemia sp one unit PRBC.Continue tube feedings, avoid anticoagulants.   5. AKI with metabolic acidosis/ hypokalemia and hypomagnesemia new hypernatremia.Patient edematous likely third spacing, due to hypoproteinemia. Na this am 153, with K at 3,9 and serum bicarbonate at 23. Urine output over last 24 H is 750 ml. Will dc IV fluids and will add 1000 ml per day free water flushes divided in 4 doses, follow on renal panel in am.   6. Thrombocytopenia.likely due to liver disease.    7. Chronic diastolic heart failure/ coronary artery disease (2018 preserved LV systolic function).tolerating well metoprolol, continue to hold on diuresis.   8. T2DM. Stable glucose, 132 to 147, will continue tube feedings and insulin sliding scale for glucose cover and monitoring.   DVT prophylaxis:scd Code Status:full Family Communication:no family at the bedside Disposition Plan/ discharge barriers:pending clinical improvement. Transfer to telemetry.    Body mass index is 28.15 kg/m. Malnutrition Type:  Nutrition Problem: Inadequate oral intake Etiology: inability to eat   Malnutrition Characteristics:  Signs/Symptoms: NPO status   Nutrition Interventions:  Interventions: Tube feeding, Prostat  RN Pressure Injury Documentation:     Consultants:     Procedures:     Antimicrobials:       Subjective: Patient continue to be very weak and deconditioned, failed swallow evaluation and continue NPO, denies any pain or dyspnea.   Objective: Vitals:   04/13/19 0600 04/13/19 0700 04/13/19 0715 04/13/19 0800  BP: 140/70  139/76 (!) 147/71  Pulse: (!) 101 99 97 (!) 103  Resp: 14 17 (!) 24 (!) 23  Temp:      TempSrc:      SpO2: 94% 94% 95% 92%  Weight:      Height:        Intake/Output Summary (Last 24 hours) at 04/13/2019 0829 Last data filed at 04/13/2019 0800 Gross per 24 hour  Intake 1567.08 ml  Output 950 ml  Net 617.08 ml    Filed Weights   04/11/19 0427 04/12/19 0500 04/13/19 0345  Weight: 91.8 kg 88.8 kg 89 kg    Examination:   General: Not in pain or dyspnea, deconditioned and ill looking appearing.  Neurology: opens eyes spontaneously, respond with yes and no to simple questions, follows commands.  E ENT: mild pallor, no icterus, oral mucosa dry.  Cardiovascular: No JVD. S1-S2 present, rhythmic, no gallops, rubs, or murmurs. No lower extremity edema. Pulmonary: positive breath sounds bilaterally, proximal rhonchi bialeral, with no wheezing, rhonchi or rales. Gastrointestinal. Abdomen distended with no organomegaly, non tender, no rebound or guarding Skin. No rashes Musculoskeletal: no joint deformities     Data Reviewed: I have personally reviewed following labs and imaging studies  CBC: Recent Labs  Lab 04/07/19 0500 04/08/19 0330 04/09/19 0433 04/09/19 1800 04/10/19 0526 04/11/19 0415  WBC 10.6* 12.2* 12.4*  --  14.2* 11.0*  HGB 8.3* 8.0* 7.0* 8.6* 8.3* 8.0*  HCT 26.4* 24.7* 21.8* 25.6* 25.1* 24.8*  MCV 120.5* 115.4* 116.0*  --  113.6* 114.8*  PLT PLATELET CLUMPS NOTED ON SMEAR, UNABLE TO ESTIMATE 82* 62*  --  PLATELET CLUMPS NOTED ON SMEAR, COUNT APPEARS DECREASED PLATELET CLUMPS NOTED ON SMEAR, COUNT APPEARS DECREASED   Basic Metabolic Panel: Recent Labs  Lab 04/09/19 0433 04/09/19 1800 04/10/19 0526 04/10/19 1500 04/10/19 1618 04/11/19 0415 04/12/19 0324 04/13/19 0406  NA 140 143 143 144  --  147* 144 154*  K 2.3* 3.0* 2.8* 4.7  --  2.8* 2.9* 3.9  CL 108 111  112* 118*  --  117* 111 122*  CO2 21* 21* 21* 18*  --  20* 24 23  GLUCOSE 443* 166* 155* 265*  --  131* 148* 133*  BUN 22* 24* 24* 20  --  25* 28* 27*  CREATININE 1.24 1.19 1.12 0.98  --  1.21 1.21 1.05  CALCIUM 7.0* 7.4* 7.2* 6.6*  --  7.6* 7.5* 8.2*  MG 1.5* 2.5* 2.2  --  1.8 1.7 1.9 1.9  PHOS 2.1* 2.6 3.1  --  2.4* 3.4  --   --    GFR: Estimated Creatinine Clearance: 84 mL/min (by C-G formula based on SCr of  1.05 mg/dL). Liver Function Tests: Recent Labs  Lab 04/08/19 0330 04/09/19 0433 04/10/19 0526 04/11/19 0415 04/12/19 0324  AST 75* 55* 48* 58* 53*  ALT 50* 38 34 34 31  ALKPHOS 277* 220* 196* 201* 212*  BILITOT 1.8* 1.4* 2.2* 2.1* 2.2*  PROT 6.3* 5.8* 6.3* 6.0* 6.1*  ALBUMIN 2.1* 2.0* 2.1* 2.0* 2.0*   No results for input(s): LIPASE, AMYLASE in the last 168 hours. Recent Labs  Lab 04/09/19 1612  AMMONIA 16   Coagulation Profile: Recent Labs  Lab 04/07/19 0500 04/08/19 0330 04/09/19 0433 04/10/19 0526 04/11/19 0415  INR 1.5* 1.5* 1.6* 1.6* 1.6*   Cardiac Enzymes: No results for input(s): CKTOTAL, CKMB, CKMBINDEX, TROPONINI in the last 168 hours. BNP (last 3 results) No results for input(s): PROBNP in the last 8760 hours. HbA1C: No results for input(s): HGBA1C in the last 72 hours. CBG: Recent Labs  Lab 04/12/19 1646 04/12/19 1959 04/12/19 2328 04/13/19 0342 04/13/19 0745  GLUCAP 154* 134* 132* 130* 147*   Lipid Profile: No results for input(s): CHOL, HDL, LDLCALC, TRIG, CHOLHDL, LDLDIRECT in the last 72 hours. Thyroid Function Tests: No results for input(s): TSH, T4TOTAL, FREET4, T3FREE, THYROIDAB in the last 72 hours. Anemia Panel: No results for input(s): VITAMINB12, FOLATE, FERRITIN, TIBC, IRON, RETICCTPCT in the last 72 hours.    Radiology Studies: I have reviewed all of the imaging during this hospital visit personally     Scheduled Meds: . sodium chloride   Intravenous Once  . sodium chloride   Intravenous Once  . Chlorhexidine Gluconate Cloth  6 each Topical Daily  . feeding supplement (PRO-STAT SUGAR FREE 64)  30 mL Per Tube Daily  . folic acid  1 mg Intravenous Daily  . hydrocortisone  25 mg Rectal QHS  . insulin aspart  0-15 Units Subcutaneous Q4H  . mouth rinse  15 mL Mouth Rinse BID  . metoprolol tartrate  25 mg Oral BID  . pantoprazole sodium  40 mg Per Tube BID  . sodium chloride flush  10-40 mL Intracatheter Q12H  . thiamine   100 mg Per NG tube Daily   Continuous Infusions: . sodium chloride 75 mL/hr at 04/13/19 0648  . sodium chloride 250 mL (04/06/19 1827)  . feeding supplement (OSMOLITE 1.2 CAL) 55 mL/hr at 04/12/19 0733     LOS: 14 days        Tarah Buboltz Gerome Apley, MD

## 2019-04-14 ENCOUNTER — Inpatient Hospital Stay (HOSPITAL_COMMUNITY): Payer: Self-pay

## 2019-04-14 DIAGNOSIS — I119 Hypertensive heart disease without heart failure: Secondary | ICD-10-CM

## 2019-04-14 LAB — BASIC METABOLIC PANEL
Anion gap: 8 (ref 5–15)
BUN: 30 mg/dL — ABNORMAL HIGH (ref 6–20)
CO2: 25 mmol/L (ref 22–32)
Calcium: 8.1 mg/dL — ABNORMAL LOW (ref 8.9–10.3)
Chloride: 120 mmol/L — ABNORMAL HIGH (ref 98–111)
Creatinine, Ser: 1.07 mg/dL (ref 0.61–1.24)
GFR calc Af Amer: >60 mL/min (ref >60)
GFR calc non Af Amer: >60 mL/min (ref >60)
Glucose, Bld: 150 mg/dL — ABNORMAL HIGH (ref 70–99)
Potassium: 3.9 mmol/L (ref 3.5–5.1)
Sodium: 153 mmol/L — ABNORMAL HIGH (ref 135–145)

## 2019-04-14 LAB — GLUCOSE, CAPILLARY
Glucose-Capillary: 132 mg/dL — ABNORMAL HIGH (ref 70–99)
Glucose-Capillary: 131 mg/dL — ABNORMAL HIGH (ref 70–99)
Glucose-Capillary: 121 mg/dL — ABNORMAL HIGH (ref 70–99)
Glucose-Capillary: 108 mg/dL — ABNORMAL HIGH (ref 70–99)
Glucose-Capillary: 90 mg/dL (ref 70–99)

## 2019-04-14 LAB — CULTURE, BLOOD (ROUTINE X 2)
Culture: NO GROWTH
Culture: NO GROWTH
Special Requests: ADEQUATE
Special Requests: ADEQUATE

## 2019-04-14 MED ORDER — PANTOPRAZOLE SODIUM 40 MG IV SOLR
40.0000 mg | Freq: Two times a day (BID) | INTRAVENOUS | Status: DC
  Administered 2019-04-15: 40 mg via INTRAVENOUS
  Filled 2019-04-14: qty 40

## 2019-04-14 MED ORDER — SODIUM CHLORIDE 0.9 % IV SOLN
2.0000 g | INTRAVENOUS | Status: DC
  Administered 2019-04-14: 19:00:00 2 g via INTRAVENOUS
  Filled 2019-04-14: qty 2

## 2019-04-14 MED ORDER — FREE WATER: 350.0000 mL | Freq: Four times a day (QID) | Status: DC

## 2019-04-14 MED ORDER — METOPROLOL TARTRATE 5 MG/5ML IV SOLN
2.5000 mg | Freq: Four times a day (QID) | INTRAVENOUS | Status: DC | PRN
  Administered 2019-04-15: 01:00:00 2.5 mg via INTRAVENOUS
  Filled 2019-04-14: qty 5

## 2019-04-14 MED ORDER — GERHARDT'S BUTT CREAM
TOPICAL_CREAM | Freq: Three times a day (TID) | CUTANEOUS | Status: DC
  Administered 2019-04-14: 12:00:00 via TOPICAL
  Administered 2019-04-15: 1 via TOPICAL
  Administered 2019-04-15 (×2): via TOPICAL
  Administered 2019-04-15: 1 via TOPICAL
  Administered 2019-04-16: 22:00:00 via TOPICAL
  Administered 2019-04-16: 1 via TOPICAL
  Administered 2019-04-16: 09:00:00 via TOPICAL
  Administered 2019-04-17 (×3): 1 via TOPICAL
  Administered 2019-04-18 – 2019-04-19 (×6): via TOPICAL
  Administered 2019-04-20: 1 via TOPICAL
  Administered 2019-04-20 – 2019-04-27 (×18): via TOPICAL
  Administered 2019-04-28: 1 via TOPICAL
  Administered 2019-04-28: 11:00:00 via TOPICAL
  Administered 2019-04-29: 1 via TOPICAL
  Administered 2019-04-30: 10:00:00 via TOPICAL
  Administered 2019-04-30: 1 via TOPICAL
  Administered 2019-04-30 – 2019-05-14 (×39): via TOPICAL
  Administered 2019-05-15: 1 via TOPICAL
  Administered 2019-05-15 – 2019-05-17 (×5): via TOPICAL
  Administered 2019-05-17: 1 via TOPICAL
  Administered 2019-05-18 – 2019-05-25 (×19): via TOPICAL
  Filled 2019-04-14 (×5): qty 1

## 2019-04-14 MED ORDER — METRONIDAZOLE IN NACL 5-0.79 MG/ML-% IV SOLN
500.0000 mg | Freq: Three times a day (TID) | INTRAVENOUS | Status: DC
  Administered 2019-04-14 – 2019-04-15 (×2): 500 mg via INTRAVENOUS
  Filled 2019-04-14 (×2): qty 100

## 2019-04-14 NOTE — Progress Notes (Signed)
[facility] Progress Note Patient Name: Richard Calhoun DOB: 07/03/1961 MRN: [id number]   Date of Service  04/14/2019  HPI/Events of Note  Obtubded from ETOH, not able to take oral metoprolol 25 q 12 scheduled. SBP 124. s  Hx of CAD with stent/HTN.   eICU Interventions  Lopressor 2.5 mg prn q 6 hr for SBP > 140 or DBP > 90 or HR > 120.      Intervention Category Intermediate Interventions: Hypertension - evaluation and management;Other:  Elmer Sow 04/14/2019, 11:44 PM

## 2019-04-14 NOTE — Progress Notes (Addendum)
Patient in respiratory distress, positive hypoxemia on room air, significant congestion on auscultation. Chest film with new infiltrate at the left lower lobe.  NG tube tip not visualized.  New aspiration pneumonia.  1. DC NG tube 2. Bipap for increase work of breathing 3. Unasyn IV 4,. Transfer to step down unit.

## 2019-04-14 NOTE — Progress Notes (Signed)
RN contacted [facility] MD three times throughout the first 4 hours of night shift, to change pt. Scheduled oral suspension lopressor to IV because pt. No longer has a tube and is at a high risk for aspiration as well as NPO. [facility] doc did reply after the third page, but RN already contacted [facility] to get them to switch medication. Lopressor is what pt. Is getting to maintain NSR/ST after conversion from A.fib. in earlier days of this hospitalization.

## 2019-04-14 NOTE — Progress Notes (Signed)
Pt with tan/yellow secretions in throat. Cough is noted but is weak. Unable to clear throat. Blue yaunker used to suction deep. Moderate amount of secretions removed. SATs 98% on 2L. HR 111. RR 28. Breath sounds are clear/deminished. Pt not placed on Bi-PAP at this time due to possible aspiration. RT will continue to monitor.

## 2019-04-14 NOTE — Progress Notes (Signed)
Upon entry into patient room, pt was audibly gurgling with respirations and O2 sats also at 84%. Pt suctioned by this nurse and 2L O2 applied, keeping patient sating between 92-94%. Respiratory also called to pt room for deep suction. MD made aware. Will continue to monitor pt.

## 2019-04-14 NOTE — Progress Notes (Signed)
PROGRESS NOTE    Richard Calhoun  [id number] DOB: 07/25/1960 DOA: 03/30/2019 PCP: Charlott Rakes, MD    Brief Narrative:  58 year old male who presented with hematemesis and abdominal pain. He does have significant past medical history for chronic alcohol abuse, chronic tobacco abuse, coronary artery disease status post angioplasty,chronic heart failure, dyslipidemia, peripheral vascular disease and paroxysmal tachycardia. He reported malaise and hematemesis.He had poor oral intake for last 3 days prior to hospitalization. He had worsening left flank pain, and unwitnessed fall, nausea and vomiting for several days. On his initial physical examination his blood pressure was 109/52, heart rate 78, oxygen saturation 96%, rotation, heart S1-S2 present rhythmic, his abdomen was soft, left-sided hematoma,no lower extremity edema. Sodium 139, potassium 4.9, chloride 99, bicarb 13, glucose 86, BUN 72, creatinine 3.6, AST 183, ALT 73, total bilirubins 2.6,white count 8.9, hemoglobin 8.5, hematocrit 26.0, platelets 39.SARS COVID-19 was negative. CT abdomen with and no acute changes.  Patient was admitted to the hospital with a working diagnosis of hematemesis due to upper GI bleed.  Patient was medically treated with IV Ativan, IV thiamine, Librium and IV octreotide/IV pantoprazole. He developed worsening mentation, failed noninvasive mechanical ventilation and was intubated September 13.  On September 16 patient underwent upper endoscopy showing esophageal varices and portal hypertensive gastropathy. His mentation improved and he was liberated from mechanical ventilation on September 18.  After liberation from mechanical ventilation patient continued to be encephalopathic, showing withdrawal symptoms, he required dexmedetomidine infusion along with benzodiazepines per protocol. He developed new onset atrial fibrillation with rapid ventricular response, his electrolytes have been  abnormal,and enteral nutrition was started by tube feeds on September 21.  Patient continue to be hypoactive and lethargic, but following commands, very weak and deconditioned.  Patient continue to be npo due to aspiration risk.    Assessment & Plan:   Active Problems:   Anxiety state   EtOH dependence (HCC)   COLONIC POLYPS, HX OF   Infected sebaceous cyst of skin   Hypertensive heart disease without heart failure   Tachycardia   Chest pain on exertion   Acute hyperactive alcohol withdrawal delirium (HCC)   S/P drug eluting coronary stent placement   Upper GI bleed   Acute renal failure (HCC)   Alcoholic hepatitis without ascites   Flank pain   Tachypnea   Encephalopathy, hepatic (HCC)   Encounter for central line placement   Acute hypernatremia   Metabolic acidosis   Thrombocytopenia (HCC)   Left arm swelling   Hypomagnesemia   Chronic diastolic CHF (congestive heart failure) (HCC)   Macrocytic anemia   Atrial fibrillation, rapid ([place])   Acute hypoactive delirium due to another medical condition    1. Hypoactive delirium, (critical illness), hepatic encephalopathy.His mentation this am is better, continue to be very weak and deconditioned, unable to talk clearly. Continue NPO and is tolerating well tube feedings. On nutritional supplements including folic acid, multivitamins and thiamine. Continue to follow with speech therapy.   2. Atrial fibrillation with RVR/ 2018 preserved LV systolic function. HR has been controlled, HR 97 to 104, will continue with metoprolol. Holding on anticoagulation due to bleeding risk.    3. Alcoholic hepatitis, in the setting of hepatic steatosis/ cirrhosis, due to alcohol abuse.No signs of persistent hepatic encephalopathy, will continue to hold on lactulose for now.  4. Upper GI bleed with acute blood loss anemia sp one unit PRBC.Avoid anticoagulants. Continue antiacid therapy with pantoprazole. Patient is tolerating tube  feedings well.   5.  AKI with metabolic acidosis/ hypokalemia and hypomagnesemia new hypernatremia.Persistent edema due to third spacing, 2ry to hypoproteinemia. Na is stable at 153, with K at 3,9 and serum bicarbonate at 25. Urine output over last 24 H is 550 ml. Will increase free water flushes to 350 ml qid and follow on renal panel in am.  6. Thrombocytopenia.stable.   7. Chronic diastolic heart failure/ coronary artery disease(2018 preserved LV systolic function).Continue heart rate control with metoprolol.   8. T2DM.Controlled glucose continue with tube feedings and insulin sliding scale.   DVT prophylaxis:scd Code Status:full Family Communication:no family at the bedside Disposition Plan/ discharge barriers:pending clinical improvement.    Body mass index is 28.15 kg/m. Malnutrition Type:  Nutrition Problem: Inadequate oral intake Etiology: inability to eat   Malnutrition Characteristics:  Signs/Symptoms: NPO status   Nutrition Interventions:  Interventions: Tube feeding, Prostat  RN Pressure Injury Documentation:     Consultants:     Procedures:     Antimicrobials:       Subjective: Patient is more awake today but continue to have confusion, difficulty speaking, has bilateral mittens to protect tubes and lines. Tolerating tube feedings.  Objective: Vitals:   04/13/19 1100 04/13/19 1428 04/13/19 2234 04/14/19 0400  BP: 117/61 136/70 137/76 128/67  Pulse: 86 98 93 100  Resp: 16 19 20 18   Temp:  100 F (37.8 C) 98.3 F (36.8 C)   TempSrc:  Oral Oral Oral  SpO2: 95% 100% 98% 96%  Weight:      Height:        Intake/Output Summary (Last 24 hours) at 04/14/2019 1113 Last data filed at 04/14/2019 0752 Gross per 24 hour  Intake 770 ml  Output 1050 ml  Net -280 ml   Filed Weights   04/11/19 0427 04/12/19 0500 04/13/19 0345  Weight: 91.8 kg 88.8 kg 89 kg    Examination:   General: deconditioned and ill looking appearing   Neurology: Awake, opens eyes spontaneously, follows commands, very weak voice, but able to formulate simple words, positive confusion.   E ENT: mild pallor, no icterus, oral mucosa moist Cardiovascular: No JVD. S1-S2 present, rhythmic, no gallops, rubs, or murmurs. ++ pitting bilateral dependent lower extremity edema. Pulmonary: positive breath sounds bilaterally, adequate air movement, no wheezing, positive proximal rhonchi but no rales. Gastrointestinal. Abdomen with no organomegaly, non tender, no rebound or guarding Skin. No rashes Musculoskeletal: no joint deformities     Data Reviewed: I have personally reviewed following labs and imaging studies  CBC: Recent Labs  Lab 04/08/19 0330 04/09/19 0433 04/09/19 1800 04/10/19 0526 04/11/19 0415  WBC 12.2* 12.4*  --  14.2* 11.0*  HGB 8.0* 7.0* 8.6* 8.3* 8.0*  HCT 24.7* 21.8* 25.6* 25.1* 24.8*  MCV 115.4* 116.0*  --  113.6* 114.8*  PLT 82* 62*  --  PLATELET CLUMPS NOTED ON SMEAR, COUNT APPEARS DECREASED PLATELET CLUMPS NOTED ON SMEAR, COUNT APPEARS DECREASED   Basic Metabolic Panel: Recent Labs  Lab 04/09/19 0433 04/09/19 1800 04/10/19 0526 04/10/19 1500 04/10/19 1618 04/11/19 0415 04/12/19 0324 04/13/19 0406 04/14/19 0500  NA 140 143 143 144  --  147* 144 154* 153*  K 2.3* 3.0* 2.8* 4.7  --  2.8* 2.9* 3.9 3.9  CL 108 111 112* 118*  --  117* 111 122* 120*  CO2 21* 21* 21* 18*  --  20* 24 23 25   GLUCOSE 443* 166* 155* 265*  --  131* 148* 133* 150*  BUN 22* 24* 24* 20  --  25*  28* 27* 30*  CREATININE 1.24 1.19 1.12 0.98  --  1.21 1.21 1.05 1.07  CALCIUM 7.0* 7.4* 7.2* 6.6*  --  7.6* 7.5* 8.2* 8.1*  MG 1.5* 2.5* 2.2  --  1.8 1.7 1.9 1.9  --   PHOS 2.1* 2.6 3.1  --  2.4* 3.4  --   --   --    GFR: Estimated Creatinine Clearance: 82.5 mL/min (by C-G formula based on SCr of 1.07 mg/dL). Liver Function Tests: Recent Labs  Lab 04/08/19 0330 04/09/19 0433 04/10/19 0526 04/11/19 0415 04/12/19 0324  AST 75* 55* 48* 58*  53*  ALT 50* 38 34 34 31  ALKPHOS 277* 220* 196* 201* 212*  BILITOT 1.8* 1.4* 2.2* 2.1* 2.2*  PROT 6.3* 5.8* 6.3* 6.0* 6.1*  ALBUMIN 2.1* 2.0* 2.1* 2.0* 2.0*   No results for input(s): LIPASE, AMYLASE in the last 168 hours. Recent Labs  Lab 04/09/19 1612  AMMONIA 16   Coagulation Profile: Recent Labs  Lab 04/08/19 0330 04/09/19 0433 04/10/19 0526 04/11/19 0415  INR 1.5* 1.6* 1.6* 1.6*   Cardiac Enzymes: No results for input(s): CKTOTAL, CKMB, CKMBINDEX, TROPONINI in the last 168 hours. BNP (last 3 results) No results for input(s): PROBNP in the last 8760 hours. HbA1C: No results for input(s): HGBA1C in the last 72 hours. CBG: Recent Labs  Lab 04/13/19 1607 04/13/19 2109 04/13/19 2356 04/14/19 0400 04/14/19 0747  GLUCAP 126* 133* 117* 132* 131*   Lipid Profile: No results for input(s): CHOL, HDL, LDLCALC, TRIG, CHOLHDL, LDLDIRECT in the last 72 hours. Thyroid Function Tests: No results for input(s): TSH, T4TOTAL, FREET4, T3FREE, THYROIDAB in the last 72 hours. Anemia Panel: No results for input(s): VITAMINB12, FOLATE, FERRITIN, TIBC, IRON, RETICCTPCT in the last 72 hours.    Radiology Studies: I have reviewed all of the imaging during this hospital visit personally     Scheduled Meds: . Chlorhexidine Gluconate Cloth  6 each Topical Daily  . feeding supplement (PRO-STAT SUGAR FREE 64)  30 mL Per Tube Daily  . folic acid  1 mg Per Tube Daily  . free water  250 mL Per Tube Q6H  . hydrocortisone  25 mg Rectal QHS  . insulin aspart  0-15 Units Subcutaneous Q4H  . mouth rinse  15 mL Mouth Rinse BID  . metoprolol tartrate  25 mg Oral BID  . multivitamin  15 mL Per Tube Daily  . pantoprazole sodium  40 mg Per Tube BID  . sodium chloride flush  10-40 mL Intracatheter Q12H  . thiamine  100 mg Per NG tube Daily   Continuous Infusions: . sodium chloride 250 mL (04/06/19 1827)  . feeding supplement (OSMOLITE 1.2 CAL) 65 mL/hr at 04/13/19 1000     LOS: 15 days         Markis Langland Gerome Apley, MD

## 2019-04-15 ENCOUNTER — Inpatient Hospital Stay (HOSPITAL_COMMUNITY): Payer: Self-pay

## 2019-04-15 LAB — GLUCOSE, CAPILLARY
Glucose-Capillary: 100 mg/dL — ABNORMAL HIGH (ref 70–99)
Glucose-Capillary: 100 mg/dL — ABNORMAL HIGH (ref 70–99)
Glucose-Capillary: 105 mg/dL — ABNORMAL HIGH (ref 70–99)
Glucose-Capillary: 105 mg/dL — ABNORMAL HIGH (ref 70–99)
Glucose-Capillary: 105 mg/dL — ABNORMAL HIGH (ref 70–99)
Glucose-Capillary: 106 mg/dL — ABNORMAL HIGH (ref 70–99)
Glucose-Capillary: 111 mg/dL — ABNORMAL HIGH (ref 70–99)
Glucose-Capillary: 95 mg/dL (ref 70–99)

## 2019-04-15 LAB — BASIC METABOLIC PANEL
Anion gap: 10 (ref 5–15)
BUN: 32 mg/dL — ABNORMAL HIGH (ref 6–20)
CO2: 24 mmol/L (ref 22–32)
Calcium: 8.1 mg/dL — ABNORMAL LOW (ref 8.9–10.3)
Chloride: 120 mmol/L — ABNORMAL HIGH (ref 98–111)
Creatinine, Ser: 1.12 mg/dL (ref 0.61–1.24)
GFR calc Af Amer: >60 mL/min (ref >60)
GFR calc non Af Amer: >60 mL/min (ref >60)
Glucose, Bld: 112 mg/dL — ABNORMAL HIGH (ref 70–99)
Potassium: 4.3 mmol/L (ref 3.5–5.1)
Sodium: 154 mmol/L — ABNORMAL HIGH (ref 135–145)

## 2019-04-15 MED ORDER — METOPROLOL TARTRATE 5 MG/5ML IV SOLN
5.0000 mg | Freq: Four times a day (QID) | INTRAVENOUS | Status: DC
  Administered 2019-04-15 – 2019-04-17 (×7): 5 mg via INTRAVENOUS
  Filled 2019-04-15 (×7): qty 5

## 2019-04-15 MED ORDER — DEXTROSE 5 % IV SOLN
INTRAVENOUS | Status: DC
  Administered 2019-04-15 – 2019-04-18 (×4): via INTRAVENOUS

## 2019-04-15 MED ORDER — PANTOPRAZOLE SODIUM 40 MG IV SOLR
40.0000 mg | INTRAVENOUS | Status: DC
  Administered 2019-04-15 – 2019-04-18 (×4): 40 mg via INTRAVENOUS
  Filled 2019-04-15 (×4): qty 40

## 2019-04-15 MED ORDER — LEVALBUTEROL HCL 0.63 MG/3ML IN NEBU
0.6300 mg | INHALATION_SOLUTION | Freq: Four times a day (QID) | RESPIRATORY_TRACT | Status: DC | PRN
  Filled 2019-04-15: qty 3

## 2019-04-15 NOTE — Progress Notes (Signed)
Audiable secretions heard during examination. Pt suctioned with hard yaunker down back of throat. Pt has a weak cough but is able to get secretions up to back of throat. Moderate amount removed. Lungs are Clear/diminished. Breath sounds are better after suctioning, SATs 100%. HR 110 after stimulation. RR 26. RT will continue to monitor.

## 2019-04-15 NOTE — Progress Notes (Addendum)
Paged several times by bedside RN throughout the night. Paged by bedside RN at [facility] regarding medications metoprolol and protonix being changed from PO to IV. Protonix was changed as requested but metoprolol was not given pt had prn metoprolol ordered. Paged again at [phone] and [phone] with the same request. Page returned and explained to nurse that the medication would not be added at this time. Nurse subsequently paged P[facility]M with the same request.   Paged by bedside RN at approximately [phone] with concerns of worsening lung sounds. Bedside evaluation performed.Slight wheezing and rhonci heard throughout both right and left lung fields. VS are stable at this time with pt sats at 100%. Will order chest xray and prn xopenex. Continue to monitor  Lovey Newcomer, NP [facility] [phone] [phone]

## 2019-04-15 NOTE — Progress Notes (Addendum)
RN paged MD about significantly worsening lung sounds compared to the beginning of the shift. RN awaiting response. MD came and saw pt. And felt okay with his current status. MD ordered a chest x-ray to be done in the morning.

## 2019-04-15 NOTE — Progress Notes (Signed)
PROGRESS NOTE    KAMEL DUNMAN  [id number] DOB: October 06, 1960 DOA: 03/30/2019 PCP: Charlott Rakes, MD    Brief Narrative:  58 year old male who presented with hematemesis and abdominal pain. He does have significant past medical history for chronic alcohol abuse, chronic tobacco abuse, coronary artery disease status post angioplasty,chronic heart failure, dyslipidemia, peripheral vascular disease and paroxysmal tachycardia. He reported malaise and hematemesis.He had poor oral intake for last 3 days prior to hospitalization. He had worsening left flank pain, and unwitnessed fall, nausea and vomiting for several days. On his initial physical examination his blood pressure was 109/52, heart rate 78, oxygen saturation 96%, rotation, heart S1-S2 present rhythmic, his abdomen was soft, left-sided hematoma,no lower extremity edema. Sodium 139, potassium 4.9, chloride 99, bicarb 13, glucose 86, BUN 72, creatinine 3.6, AST 183, ALT 73, total bilirubins 2.6,white count 8.9, hemoglobin 8.5, hematocrit 26.0, platelets 39.SARS COVID-19 was negative. CT abdomen with and no acute changes.  Patient was admitted to the hospital with a working diagnosis of hematemesis due to upper GI bleed.  Patient was medically treated with IV Ativan, IV thiamine, Librium and IV octreotide/IV pantoprazole. He developed worsening mentation, failed noninvasive mechanical ventilation and was intubated September 13.  On September 16 patient underwent upper endoscopy showing esophageal varices and portal hypertensive gastropathy. His mentation improved and he was liberated from mechanical ventilation on September 18.  After liberation from mechanical ventilation patient continued to be encephalopathic, showing withdrawal symptoms, he required dexmedetomidine infusion along with benzodiazepines per protocol. He developed new onset atrial fibrillation with rapid ventricular response, his electrolytes have been  abnormal,and enteral nutrition was started by tube feeds on September 21.  Patient continue to be hypoactive and lethargic, but following commands, very weak and deconditioned.  Patient continue to be npo due to aspiration risk.  On 09/26, noted dislodged NG tube, worsening hypoxic respiratory failure, chest film with new left lower lobe infiltrate, started on antibiotic therapy and transferred to stepdown unit.   Follow up chest film confirming aspiration pneumonitis, held antibiotic therapy.   Assessment & Plan:   Principal Problem:   Acute hyperactive alcohol withdrawal delirium (HCC) Active Problems:   Anxiety state   EtOH dependence ([place])   Hypertensive heart disease without heart failure   S/P drug eluting coronary stent placement   Upper GI bleed   Acute renal failure (HCC)   Alcoholic hepatitis without ascites   Encephalopathy, hepatic (HCC)   Encounter for central line placement   Acute hypernatremia   Metabolic acidosis   Thrombocytopenia (HCC)   Left arm swelling   Hypomagnesemia   Chronic diastolic CHF (congestive heart failure) (HCC)   Macrocytic anemia   Atrial fibrillation, rapid ([place])    1. New left lower lobe aspiration pneumonitis complicated with acute hypoxic respiratory failure. Patient transferred to stepdown unit, did not require non invasive mechanical ventilation, but continue to be very congested. Oxygenation is 99% on 2 lpm per [place], follow up chest film this am personally reviewed noted no frank infiltrate. Will continue close oxymetry monitoring, aspiration precautions, will hold antibiotic therapy for now. Patient will need more stable nutritional port, will consult with speech therapy, patient may need a peg tube.   1. Hypoactive delirium, (critical illness), hepatic encephalopathy.continue to follow commands, he is orientated and not agitated, continue to have bilateral mittens. Will continue neuro checks per unit protocol, aspiration  precautions.   2. Atrial fibrillation with RVR/ 2018 preserved LV systolic function.HR has been 99 to 100, he has lost  enteral port, will change to metoprolol IV for rate control.   3. Alcoholic hepatitis, in the setting of hepatic steatosis/ cirrhosis, due to alcohol abuse.No signs of persistent hepatic encephalopathy. No lactulose therapy on hold for now.   4. Upper GI bleed with acute blood loss anemia sp one unit PRBC. Antiacid therapy with IV pantoprazole. Will need enteral nutrition.  5. AKI with metabolic acidosis/ hypokalemia and hypomagnesemianew hypernatremia. Na is stable at 154, with K at 4,3 and serum bicarbonate at 32. Urine output over last 24 H is 1,300 ml. Continue increase free water flushes to 350 ml qid. Will add d5w IV for now while patient NPO.  6. Thrombocytopenia.stable.   7. Chronic diastolic heart failure/ coronary artery disease(2018 preserved LV systolic function).clinically stable, will continue with metoprolol.    8. T2DM. Glucose has been well controlled, patient is now NPO, will dc insulin therapy for now.  DVT prophylaxis:scd Code Status:full Family Communication:no family at the bedside Disposition Plan/ discharge barriers:pending clinical improvement.     Body mass index is 27.43 kg/m. Malnutrition Type:  Nutrition Problem: Inadequate oral intake Etiology: inability to eat   Malnutrition Characteristics:  Signs/Symptoms: NPO status   Nutrition Interventions:  Interventions: Tube feeding, Prostat  RN Pressure Injury Documentation:     Consultants:     Procedures:     Antimicrobials:       Subjective: Patient continue to be very congested, positive dyspnea, no nausea or vomiting, patient has difficulty communicating due to weak speech.   Objective: Vitals:   04/15/19 0700 04/15/19 0704 04/15/19 0752 04/15/19 0800  BP: 128/72   (!) 105/58  Pulse: 100  (!) 110 100  Resp: 18  (!) 26 20  Temp:  99.1  F (37.3 C)    TempSrc:  Axillary    SpO2: 100%  100% 99%  Weight:      Height:        Intake/Output Summary (Last 24 hours) at 04/15/2019 0846 Last data filed at 04/15/2019 0500 Gross per 24 hour  Intake 297.05 ml  Output 1075 ml  Net -777.95 ml   Filed Weights   04/13/19 0345 04/14/19 1816 04/15/19 0414  Weight: 89 kg 85.6 kg 86.7 kg    Examination:   General: deconditioned and ill looking appearing Neurology: Awake and alert, non focal  E ENT: mild pallor, no icterus, oral mucosa moist Cardiovascular: No JVD. S1-S2 present, rhythmic, no gallops, rubs, or murmurs. ++ pitting dependent lower extremity edema. Pulmonary: positive breath sounds bilaterally, no wheezing, diffuse proximal rhonchi with no significant rales. Gastrointestinal. Abdomen mild no organomegaly, non tender, no rebound or guarding Skin. No rashes Musculoskeletal: no joint deformities     Data Reviewed: I have personally reviewed following labs and imaging studies  CBC: Recent Labs  Lab 04/09/19 0433 04/09/19 1800 04/10/19 0526 04/11/19 0415  WBC 12.4*  --  14.2* 11.0*  HGB 7.0* 8.6* 8.3* 8.0*  HCT 21.8* 25.6* 25.1* 24.8*  MCV 116.0*  --  113.6* 114.8*  PLT 62*  --  PLATELET CLUMPS NOTED ON SMEAR, COUNT APPEARS DECREASED PLATELET CLUMPS NOTED ON SMEAR, COUNT APPEARS DECREASED   Basic Metabolic Panel: Recent Labs  Lab 04/09/19 0433 04/09/19 1800 04/10/19 0526  04/10/19 1618 04/11/19 0415 04/12/19 0324 04/13/19 0406 04/14/19 0500 04/15/19 0754  NA 140 143 143   < >  --  147* 144 154* 153* 154*  K 2.3* 3.0* 2.8*   < >  --  2.8* 2.9* 3.9 3.9 4.3  CL  108 111 112*   < >  --  117* 111 122* 120* 120*  CO2 21* 21* 21*   < >  --  20* 24 23 25 24   GLUCOSE 443* 166* 155*   < >  --  131* 148* 133* 150* 112*  BUN 22* 24* 24*   < >  --  25* 28* 27* 30* 32*  CREATININE 1.24 1.19 1.12   < >  --  1.21 1.21 1.05 1.07 1.12  CALCIUM 7.0* 7.4* 7.2*   < >  --  7.6* 7.5* 8.2* 8.1* 8.1*  MG 1.5* 2.5* 2.2   --  1.8 1.7 1.9 1.9  --   --   PHOS 2.1* 2.6 3.1  --  2.4* 3.4  --   --   --   --    < > = values in this interval not displayed.   GFR: Estimated Creatinine Clearance: 72.4 mL/min (by C-G formula based on SCr of 1.12 mg/dL). Liver Function Tests: Recent Labs  Lab 04/09/19 0433 04/10/19 0526 04/11/19 0415 04/12/19 0324  AST 55* 48* 58* 53*  ALT 38 34 34 31  ALKPHOS 220* 196* 201* 212*  BILITOT 1.4* 2.2* 2.1* 2.2*  PROT 5.8* 6.3* 6.0* 6.1*  ALBUMIN 2.0* 2.1* 2.0* 2.0*   No results for input(s): LIPASE, AMYLASE in the last 168 hours. Recent Labs  Lab 04/09/19 1612  AMMONIA 16   Coagulation Profile: Recent Labs  Lab 04/09/19 0433 04/10/19 0526 04/11/19 0415  INR 1.6* 1.6* 1.6*   Cardiac Enzymes: No results for input(s): CKTOTAL, CKMB, CKMBINDEX, TROPONINI in the last 168 hours. BNP (last 3 results) No results for input(s): PROBNP in the last 8760 hours. HbA1C: No results for input(s): HGBA1C in the last 72 hours. CBG: Recent Labs  Lab 04/14/19 1613 04/14/19 2001 04/15/19 0002 04/15/19 0342 04/15/19 0742  GLUCAP 108* 90 95 111* 105*   Lipid Profile: No results for input(s): CHOL, HDL, LDLCALC, TRIG, CHOLHDL, LDLDIRECT in the last 72 hours. Thyroid Function Tests: No results for input(s): TSH, T4TOTAL, FREET4, T3FREE, THYROIDAB in the last 72 hours. Anemia Panel: No results for input(s): VITAMINB12, FOLATE, FERRITIN, TIBC, IRON, RETICCTPCT in the last 72 hours.    Radiology Studies: I have reviewed all of the imaging during this hospital visit personally     Scheduled Meds: . Chlorhexidine Gluconate Cloth  6 each Topical Daily  . feeding supplement (PRO-STAT SUGAR FREE 64)  30 mL Per Tube Daily  . folic acid  1 mg Per Tube Daily  . Gerhardt's butt cream   Topical TID  . hydrocortisone  25 mg Rectal QHS  . insulin aspart  0-15 Units Subcutaneous Q4H  . mouth rinse  15 mL Mouth Rinse BID  . metoprolol tartrate  25 mg Oral BID  . multivitamin  15 mL  Per Tube Daily  . pantoprazole (PROTONIX) IV  40 mg Intravenous Q12H  . sodium chloride flush  10-40 mL Intracatheter Q12H  . thiamine  100 mg Per NG tube Daily   Continuous Infusions: . sodium chloride 250 mL (04/06/19 1827)  . cefTRIAXone (ROCEPHIN)  IV Stopped (04/14/19 1913)   And  . metronidazole Stopped (04/15/19 0227)  . feeding supplement (OSMOLITE 1.2 CAL) Stopped (04/14/19 1700)     LOS: 16 days        Mauricio Gerome Apley, MD

## 2019-04-16 ENCOUNTER — Inpatient Hospital Stay (HOSPITAL_COMMUNITY): Payer: Self-pay

## 2019-04-16 LAB — BASIC METABOLIC PANEL
Anion gap: 9 (ref 5–15)
BUN: 32 mg/dL — ABNORMAL HIGH (ref 6–20)
CO2: 22 mmol/L (ref 22–32)
Calcium: 7.9 mg/dL — ABNORMAL LOW (ref 8.9–10.3)
Chloride: 118 mmol/L — ABNORMAL HIGH (ref 98–111)
Creatinine, Ser: 1.09 mg/dL (ref 0.61–1.24)
GFR calc Af Amer: >60 mL/min (ref >60)
GFR calc non Af Amer: >60 mL/min (ref >60)
Glucose, Bld: 244 mg/dL — ABNORMAL HIGH (ref 70–99)
Potassium: 3.6 mmol/L (ref 3.5–5.1)
Sodium: 149 mmol/L — ABNORMAL HIGH (ref 135–145)

## 2019-04-16 LAB — CBC WITH DIFFERENTIAL/PLATELET
Abs Immature Granulocytes: 0.06 10*3/uL (ref 0.00–0.07)
Basophils Absolute: 0 10*3/uL (ref 0.0–0.1)
Basophils Relative: 0 %
Eosinophils Absolute: 0.1 10*3/uL (ref 0.0–0.5)
Eosinophils Relative: 1 %
HCT: 23.2 % — ABNORMAL LOW (ref 39.0–52.0)
Hemoglobin: 7 g/dL — ABNORMAL LOW (ref 13.0–17.0)
Immature Granulocytes: 1 %
Lymphocytes Relative: 5 %
Lymphs Abs: 0.5 10*3/uL — ABNORMAL LOW (ref 0.7–4.0)
MCH: 35.9 pg — ABNORMAL HIGH (ref 26.0–34.0)
MCHC: 30.2 g/dL (ref 30.0–36.0)
MCV: 119 fL — ABNORMAL HIGH (ref 80.0–100.0)
Monocytes Absolute: 0.4 10*3/uL (ref 0.1–1.0)
Monocytes Relative: 3 %
Neutro Abs: 9.8 10*3/uL — ABNORMAL HIGH (ref 1.7–7.7)
Neutrophils Relative %: 90 %
Platelets: DECREASED 10*3/uL (ref 150–400)
RBC: 1.95 MIL/uL — ABNORMAL LOW (ref 4.22–5.81)
RDW: 18.7 % — ABNORMAL HIGH (ref 11.5–15.5)
WBC: 10.8 10*3/uL — ABNORMAL HIGH (ref 4.0–10.5)
nRBC: 0 % (ref 0.0–0.2)

## 2019-04-16 LAB — GLUCOSE, CAPILLARY
Glucose-Capillary: 111 mg/dL — ABNORMAL HIGH (ref 70–99)
Glucose-Capillary: 121 mg/dL — ABNORMAL HIGH (ref 70–99)
Glucose-Capillary: 108 mg/dL — ABNORMAL HIGH (ref 70–99)
Glucose-Capillary: 147 mg/dL — ABNORMAL HIGH (ref 70–99)
Glucose-Capillary: 117 mg/dL — ABNORMAL HIGH (ref 70–99)

## 2019-04-16 NOTE — Progress Notes (Signed)
PROGRESS NOTE    Richard Calhoun  [id number] DOB: 07/09/1961 DOA: 03/30/2019 PCP: Charlott Rakes, MD    Brief Narrative:  58 year old male who presented with hematemesis and abdominal pain. He does have significant past medical history for chronic alcohol abuse, chronic tobacco abuse, coronary artery disease status post angioplasty,chronic heart failure, dyslipidemia, peripheral vascular disease and paroxysmal tachycardia. He reported malaise and hematemesis.He had poor oral intake for last 3 days prior to hospitalization. He had worsening left flank pain, and unwitnessed fall, nausea and vomiting for several days. On his initial physical examination his blood pressure was 109/52, heart rate 78, oxygen saturation 96%, rotation, heart S1-S2 present rhythmic, his abdomen was soft, left-sided hematoma,no lower extremity edema. Sodium 139, potassium 4.9, chloride 99, bicarb 13, glucose 86, BUN 72, creatinine 3.6, AST 183, ALT 73, total bilirubins 2.6,white count 8.9, hemoglobin 8.5, hematocrit 26.0, platelets 39.SARS COVID-19 was negative. CT abdomen with and no acute changes.  Patient was admitted to the hospital with a working diagnosis of hematemesis due to upper GI bleed.  Patient was medically treated with IV Ativan, IV thiamine, Librium and IV octreotide/IV pantoprazole. He developed worsening mentation, failed noninvasive mechanical ventilation and was intubated September 13.  On September 16 patient underwent upper endoscopy showing esophageal varices and portal hypertensive gastropathy. His mentation improved and he was liberated from mechanical ventilation on September 18.  After liberation from mechanical ventilation patient continued to be encephalopathic, showing withdrawal symptoms, he required dexmedetomidine infusion along with benzodiazepines per protocol. He developed new onset atrial fibrillation with rapid ventricular response, his electrolytes have been  abnormal,and enteral nutrition was started by tube feeds on September 21.  Patient continue to be hypoactive and lethargic, but following commands, very weak and deconditioned.  Patient continue to be npo due to aspiration risk.  On 09/26, noted dislodged NG tube, worsening hypoxic respiratory failure, chest film with new left lower lobe infiltrate, started on antibiotic therapy and transferred to stepdown unit.   Follow up chest film confirming aspiration pneumonitis, held antibiotic therapy.    Assessment & Plan:   Principal Problem:   Acute hyperactive alcohol withdrawal delirium (HCC) Active Problems:   Anxiety state   EtOH dependence ([place])   Hypertensive heart disease without heart failure   S/P drug eluting coronary stent placement   Upper GI bleed   Acute renal failure (HCC)   Alcoholic hepatitis without ascites   Encephalopathy, hepatic (HCC)   Encounter for central line placement   Acute hypernatremia   Metabolic acidosis   Thrombocytopenia (HCC)   Left arm swelling   Hypomagnesemia   Chronic diastolic CHF (congestive heart failure) (HCC)   Macrocytic anemia   Atrial fibrillation, rapid ([place])    1. New left lower lobe aspiration pneumonitis complicated with acute hypoxic respiratory failure. This am patient is more alert and responsive, his oxygenation is 96% to 100% on room air. No fever or leukocytosis, he continues to be NPO. Will have speech evaluation today, if he continues to fail swallow evaluations, may need PEG tube for enteral nutrition. Continue with aspiration precautions.   1. Hypoactive delirium, (critical illness), hepatic encephalopathy.More awake and alert, following commands, no agitation, mild confusion. His voice continue to be very weak limiting communication.    2. Atrial fibrillation with RVR/ 2018 preserved LV systolic function.Continue sinus rhythm with heart rate 74 to 77, continue metoprolol scheduled with holding parameters. Not  on anticoagulation for now.   3. Alcoholic hepatitis, in the setting of hepatic steatosis/ cirrhosis,  due to alcohol abuse.No signs of persistent hepatic encephalopathy. Continue neuro checks per unit protocol.  4. Upper GI bleed with acute blood loss anemia sp one unit PRBC. On IV pantoprazole. Follow with speech evaluation today.   5. AKI with metabolic acidosis/ hypokalemia and hypomagnesemianew hypernatremia. No signs of volume overload, continue to have edema due to third spacing, Na down to 149 with K at 3,6 and serum bicarbonate at 32 with preserved renal function serum cr at 1,0. Will continue D5W at 50 ml per H, follow renal panel in am.   6. Thrombocytopenia.Plt today were clumped.  7. Chronic diastolic heart failure/ coronary artery disease(2018 preserved LV systolic function).Continue telemetry monitoring, no signs of exacerbation. Continue metoprolol with holding parameters.  8. T2DM. Fasting glucose is 244, continue IV dextrose, will hold on insulin therapy for now, patient is NPO.  DVT prophylaxis:scd Code Status:full Family Communication:no family at the bedside Disposition Plan/ discharge barriers:pending clinical improvement, swallow evaluation.     Body mass index is 27.08 kg/m. Malnutrition Type:  Nutrition Problem: Inadequate oral intake Etiology: inability to eat   Malnutrition Characteristics:  Signs/Symptoms: NPO status   Nutrition Interventions:  Interventions: Tube feeding, Prostat  RN Pressure Injury Documentation:     Consultants:   Nephrology   Critical care  GI   Procedures:   Upper endosocopy  Antimicrobials:       Subjective: Patient this am is more awake and reactive, denies any dyspnea or chest pain, continue to have very weak voice, no nausea or vomiting, he has been NPO.   Objective: Vitals:   04/16/19 0500 04/16/19 0600 04/16/19 0700 04/16/19 0800  BP: (!) 93/53 111/62 (!) 123/59   Pulse: 70  67 77   Resp: 13 (!) 21 13   Temp:    98 F (36.7 C)  TempSrc:    Oral  SpO2: 96% 100% 99%   Weight:      Height:        Intake/Output Summary (Last 24 hours) at 04/16/2019 0828 Last data filed at 04/16/2019 0700 Gross per 24 hour  Intake 1060.05 ml  Output 1850 ml  Net -789.95 ml   Filed Weights   04/14/19 1816 04/15/19 0414 04/16/19 0357  Weight: 85.6 kg 86.7 kg 85.6 kg    Examination:   General: deconditioned  Neurology: Awake and alert, non focal, follows commands, continue to have weak voice.  E ENT: mild pallor, no icterus, oral mucosa dry Cardiovascular: No JVD. S1-S2 present, rhythmic, no gallops, rubs, or murmurs. Pitting dependent lower extremity edema. Pulmonary: positive breath sounds bilaterally, adequate air movement, no wheezing, rhonchi or rales. Gastrointestinal. Abdomen with no organomegaly, non tender, no rebound or guarding Skin. No rashes Musculoskeletal: no joint deformities     Data Reviewed: I have personally reviewed following labs and imaging studies  CBC: Recent Labs  Lab 04/09/19 1800 04/10/19 0526 04/11/19 0415 04/16/19 0359  WBC  --  14.2* 11.0* 10.8*  NEUTROABS  --   --   --  9.8*  HGB 8.6* 8.3* 8.0* 7.0*  HCT 25.6* 25.1* 24.8* 23.2*  MCV  --  113.6* 114.8* 119.0*  PLT  --  PLATELET CLUMPS NOTED ON SMEAR, COUNT APPEARS DECREASED PLATELET CLUMPS NOTED ON SMEAR, COUNT APPEARS DECREASED PLATELET CLUMPS NOTED ON SMEAR, COUNT APPEARS DECREASED   Basic Metabolic Panel: Recent Labs  Lab 04/09/19 1800 04/10/19 0526  04/10/19 1618 04/11/19 0415 04/12/19 0324 04/13/19 0406 04/14/19 0500 04/15/19 0754 04/16/19 0359  NA 143 143   < >  --  147* 144 154* 153* 154* 149*  K 3.0* 2.8*   < >  --  2.8* 2.9* 3.9 3.9 4.3 3.6  CL 111 112*   < >  --  117* 111 122* 120* 120* 118*  CO2 21* 21*   < >  --  20* 24 23 25 24 22   GLUCOSE 166* 155*   < >  --  131* 148* 133* 150* 112* 244*  BUN 24* 24*   < >  --  25* 28* 27* 30* 32* 32*  CREATININE  1.19 1.12   < >  --  1.21 1.21 1.05 1.07 1.12 1.09  CALCIUM 7.4* 7.2*   < >  --  7.6* 7.5* 8.2* 8.1* 8.1* 7.9*  MG 2.5* 2.2  --  1.8 1.7 1.9 1.9  --   --   --   PHOS 2.6 3.1  --  2.4* 3.4  --   --   --   --   --    < > = values in this interval not displayed.   GFR: Estimated Creatinine Clearance: 74.4 mL/min (by C-G formula based on SCr of 1.09 mg/dL). Liver Function Tests: Recent Labs  Lab 04/10/19 0526 04/11/19 0415 04/12/19 0324  AST 48* 58* 53*  ALT 34 34 31  ALKPHOS 196* 201* 212*  BILITOT 2.2* 2.1* 2.2*  PROT 6.3* 6.0* 6.1*  ALBUMIN 2.1* 2.0* 2.0*   No results for input(s): LIPASE, AMYLASE in the last 168 hours. Recent Labs  Lab 04/09/19 1612  AMMONIA 16   Coagulation Profile: Recent Labs  Lab 04/10/19 0526 04/11/19 0415  INR 1.6* 1.6*   Cardiac Enzymes: No results for input(s): CKTOTAL, CKMB, CKMBINDEX, TROPONINI in the last 168 hours. BNP (last 3 results) No results for input(s): PROBNP in the last 8760 hours. HbA1C: No results for input(s): HGBA1C in the last 72 hours. CBG: Recent Labs  Lab 04/15/19 1611 04/15/19 1955 04/15/19 2325 04/16/19 0318 04/16/19 0758  GLUCAP 106* 105* 100* 111* 121*   Lipid Profile: No results for input(s): CHOL, HDL, LDLCALC, TRIG, CHOLHDL, LDLDIRECT in the last 72 hours. Thyroid Function Tests: No results for input(s): TSH, T4TOTAL, FREET4, T3FREE, THYROIDAB in the last 72 hours. Anemia Panel: No results for input(s): VITAMINB12, FOLATE, FERRITIN, TIBC, IRON, RETICCTPCT in the last 72 hours.    Radiology Studies: I have reviewed all of the imaging during this hospital visit personally     Scheduled Meds: . Chlorhexidine Gluconate Cloth  6 each Topical Daily  . feeding supplement (PRO-STAT SUGAR FREE 64)  30 mL Per Tube Daily  . Gerhardt's butt cream   Topical TID  . hydrocortisone  25 mg Rectal QHS  . mouth rinse  15 mL Mouth Rinse BID  . metoprolol tartrate  5 mg Intravenous Q6H  . pantoprazole (PROTONIX)  IV  40 mg Intravenous Q24H  . sodium chloride flush  10-40 mL Intracatheter Q12H   Continuous Infusions: . sodium chloride 250 mL (04/06/19 1827)  . dextrose 50 mL/hr at 04/16/19 0700  . feeding supplement (OSMOLITE 1.2 CAL) Stopped (04/14/19 1700)     LOS: 17 days        Richard Vandervelden Gerome Apley, MD

## 2019-04-16 NOTE — Progress Notes (Signed)
Modified Barium Swallow Progress Note  Patient Details  Name: VALJEAN OR MRN: [id number] Date of Birth: 04/15/1961  Today's Date: 04/16/2019  Modified Barium Swallow completed.  Full report located under Chart Review in the Imaging Section.  Brief recommendations include the following:  Clinical Impression  Patient presents with mild oropharyngel dysphagia characterized by delayed oral transiting and decreased propulsion.  Pharyngeal swallow is strong without residuals even after her consumed 5 boluses of applesauce *without flouro* but decreased laryngeal closure results in laryngeal penetration and trace aspiration of thin.  Chin tuck prevented aspiration with straw.  Recommend dys3/thin with strict precautions due to respiratory deficits.    Swallow Evaluation Recommendations       SLP Diet Recommendations: Dysphagia 3 (Mech soft) solids;Thin liquid   Liquid Administration via: Cup;Straw   Medication Administration: Whole meds with puree   Supervision: Patient able to self feed   Compensations: Minimize environmental distractions;Slow rate;Small sips/bites   Postural Changes: Seated upright at 90 degrees;Remain semi-upright after after feeds/meals (Comment)   Oral Care Recommendations: Oral care BID      Luanna Salk, MS [facility] Acute Rehab Services Pager [phone] Office [phone]   Macario Golds 04/16/2019,2:18 PM

## 2019-04-16 NOTE — Progress Notes (Signed)
Physical Therapy Treatment Patient Details Name: Richard Calhoun MRN: [id number] DOB: 09/25/1960 Today's Date: 04/16/2019    History of Present Illness 58 yo male admitted to ED on 9/10 for R flank pain, ETOH, N/V. Pt requiring intubation 9/13-9/18.Endoscopy reveals nonbleeding erosive gastropathy, duodenal erosions; placed feeding tube. Pt with new diagnosis LLL aspiration pneumonitis, hypoactive delirium. PMH includes anxiety, alcohol abuse, depression, GERD, HTN, HLD, tubular adenoma of colon, CAD with stent placement, PVD.    PT Comments    Pt with much improved cognitive and physical presentation today, pt awake and alert and motivated to participate in mobility. Pt also more communicative today, PT encouraging pt to enunciate for improved PT understanding as pt is very softspoken. Pt able to tolerate multiple sit to stands and stand pivot x1 during session. Pt is very unsteady in standing, but has not been OOB in 17 days. PT to continue to follow to progress mobility as tolerated. PT suggesting if pt gets to recliner with rehab dept, pt has a chair pad placed for safe return to bed.    Follow Up Recommendations  SNF;Supervision/Assistance - 24 hour     Equipment Recommendations  Other (comment)(defer to next venue)    Recommendations for Other Services       Precautions / Restrictions Precautions Precautions: Fall Precaution Comments: CIWA, very softspoken Restrictions Weight Bearing Restrictions: No    Mobility  Bed Mobility Overal bed mobility: Needs Assistance Bed Mobility: Supine to Sit     Supine to sit: Mod assist;+2 for safety/equipment;HOB elevated     General bed mobility comments: Mod assist +2 for LE and trunk translation, scooting to EOB with use of bed pad. Pt reporting mild dizziness upon sitting which passed.  Transfers Overall transfer level: Needs assistance Equipment used: 2 person hand held assist;Rolling walker (2 wheeled) Transfers: Sit to/from  [facility] Sit to Stand: Mod assist;+2 safety/equipment;+2 physical assistance Stand pivot transfers: Mod assist;+2 physical assistance;+2 safety/equipment       General transfer comment: Sit to stand x3, once from bed and x2 from [facility]. Mod assist +2 for power up, hand placement pushing from support surface, steadying, trunk and hip extension to come to erect standing. Mod assist +2 for stand pivot from bed to [facility] for steadying, guarding LEs to prevent buckling. Stand pivot x1, attempted 2nd but pt too fatigued so recliner brought behind pt in standing.  Ambulation/Gait Ambulation/Gait assistance: (NT - weakness and fatigue)               Stairs             Wheelchair Mobility    Modified Rankin (Stroke Patients Only)       Balance Overall balance assessment: Needs assistance Sitting-balance support: Bilateral upper extremity supported;Feet supported Sitting balance-Leahy Scale: Fair Sitting balance - Comments: sat EOB unsupported, with UE propping   Standing balance support: Bilateral upper extremity supported;During functional activity Standing balance-Leahy Scale: Poor Standing balance comment: reliant on RW and PT for support                            Cognition Arousal/Alertness: Awake/alert Behavior During Therapy: Flat affect Overall Cognitive Status: Impaired/Different from baseline Area of Impairment: Following commands;Safety/judgement;Problem solving                       Following Commands: Follows one step commands consistently;Follows multi-step commands inconsistently;Follows one step commands with increased time Safety/Judgement:  Decreased awareness of safety   Problem Solving: Slow processing;Decreased initiation;Difficulty sequencing;Requires verbal cues;Requires tactile cues General Comments: Pt A&O this session, following 1 step commands 100% of the time. Pt requires multimodal cuing for more multistep  commands. Pt with anxiety with mobility, limited safety awareness due to anxiety and at times panicking to find destination surface.      Exercises      General Comments        Pertinent Vitals/Pain Pain Assessment: Faces Faces Pain Scale: Hurts little more Pain Location: buttocks Pain Descriptors / Indicators: Grimacing;Sore;Other (Comment)(raw) Pain Intervention(s): Limited activity within patient's tolerance;Monitored during session;Repositioned    Home Living                      Prior Function            PT Goals (current goals can now be found in the care plan section) Acute Rehab PT Goals PT Goal Formulation: With patient Time For Goal Achievement: 04/23/19 Potential to Achieve Goals: Fair Progress towards PT goals: Progressing toward goals    Frequency    Min 2X/week      PT Plan Current plan remains appropriate    Co-evaluation              AM-PAC PT "6 Clicks" Mobility   Outcome Measure  Help needed turning from your back to your side while in a flat bed without using bedrails?: A Lot Help needed moving from lying on your back to sitting on the side of a flat bed without using bedrails?: A Lot Help needed moving to and from a bed to a chair (including a wheelchair)?: A Lot Help needed standing up from a chair using your arms (e.g., wheelchair or bedside chair)?: A Lot Help needed to walk in hospital room?: Total Help needed climbing 3-5 steps with a railing? : Total 6 Click Score: 10    End of Session Equipment Utilized During Treatment: Gait belt Activity Tolerance: Patient limited by fatigue Patient left: with call bell/phone within reach;in chair;with nursing/sitter in room(pt agrees to press call button to mobilize back to bed, pt with lift pad under pt to lift back to bed) Nurse Communication: Mobility status PT Visit Diagnosis: Muscle weakness (generalized) (M62.81);Other abnormalities of gait and mobility (R26.89)     Time:  [id number] PT Time Calculation (min) (ACUTE ONLY): 25 min  Charges:  $Therapeutic Activity: 23-37 mins                     Julien Girt, PT Acute Rehabilitation Services Pager [phone]  Office [phone]    Roxine Caddy D Elonda Husky 04/16/2019, 3:37 PM

## 2019-04-16 NOTE — Progress Notes (Signed)
Pt seen, resting well, no increased wob/respiratory distress noted.  HR88, RR19, spo2 98% on room air.  Bipap not indicated at this time.

## 2019-04-16 NOTE — Progress Notes (Signed)
  Speech Language Pathology Treatment: Dysphagia  Patient Details Name: Richard Calhoun MRN: [id number] DOB: 1961-02-17 Today's Date: 04/16/2019 Time: [id number] SLP Time Calculation (min) (ACUTE ONLY): 44 min  Assessment / Plan / Recommendation Clinical Impression  Today pt seen to assess readiness for po diet.  He is alert but remains severely dysphonic - nearly aphonic.  Use of communication board helpful for him to spell out words.  He speaks at rapid rate which also decreases his intelligibility. Encouraged pt using yes/no and cued slow rate to improve expressive communication with max cues.  PO trials of ice chips, thin water, nectar thick juice and applesauce completed.  He does not demonstrate overt indication of residuals as he does not present with multiple swallows.  He likely however aspirated nectar thick liquids c/b cough before, during and after swallow.  Suspect inhalation before swallow caused aspiration.  SlP educated pt to primary purpose of vocal cord closure being airway protection.  Pt admits to biting on tube *describes breathing tube* causing SLP to question if he agitated larynx with excessive movement, impact of endoscopy vs impact of hematemesis on vocal cord closure.  Will plan MBS today in hopes that pt will be able to transition into po diet today.  RN, MD and xray informed.  Provided pt with ice chips/water to use for moisture pending MBS.  THanks.           HPI HPI: 59 y.o. year old male admitted 03/30/2019 with reports of worsening left flank pain, unwitnessed fall, nausea and vomiting for several days and episode of hematemesis, concern for upper GI bleed causing AKI. PMH: chronic alcohol abuse, CAD s/p DES (2018), CHFpEF, HLD. Pt intubated 9/13-18/2020.  Pt has been npo with likely secretion aspiration and has had an NG placed.  NG was removed over the weekend at it was coiled in his nose.MD notified SLP of need for pt to have an evaluation to determine if he can consume po  intake due to length of npo, hospital stay.      SLP Plan  MBS       Recommendations  Diet recommendations: NPO(proceed with MBS today) Medication Administration: Via alternative means                Oral Care Recommendations: Oral care QID;Staff/trained caregiver to provide oral care Follow up Recommendations: (TBD) SLP Visit Diagnosis: Dysphagia, unspecified (R13.10) Plan: MBS       GO                Macario Golds 04/16/2019, 11:25 AM  Luanna Salk, MS [facility] SLP Acute Rehab Services Pager [phone] Office [phone]

## 2019-04-17 DIAGNOSIS — I4891 Unspecified atrial fibrillation: Secondary | ICD-10-CM

## 2019-04-17 DIAGNOSIS — F10231 Alcohol dependence with withdrawal delirium: Secondary | ICD-10-CM

## 2019-04-17 DIAGNOSIS — E870 Hyperosmolality and hypernatremia: Secondary | ICD-10-CM

## 2019-04-17 LAB — BASIC METABOLIC PANEL
Anion gap: 8 (ref 5–15)
BUN: 30 mg/dL — ABNORMAL HIGH (ref 6–20)
CO2: 23 mmol/L (ref 22–32)
Calcium: 7.7 mg/dL — ABNORMAL LOW (ref 8.9–10.3)
Chloride: 115 mmol/L — ABNORMAL HIGH (ref 98–111)
Creatinine, Ser: 1.08 mg/dL (ref 0.61–1.24)
GFR calc Af Amer: >60 mL/min (ref >60)
GFR calc non Af Amer: >60 mL/min (ref >60)
Glucose, Bld: 111 mg/dL — ABNORMAL HIGH (ref 70–99)
Potassium: 3.2 mmol/L — ABNORMAL LOW (ref 3.5–5.1)
Sodium: 146 mmol/L — ABNORMAL HIGH (ref 135–145)

## 2019-04-17 LAB — GLUCOSE, CAPILLARY
Glucose-Capillary: 106 mg/dL — ABNORMAL HIGH (ref 70–99)
Glucose-Capillary: 110 mg/dL — ABNORMAL HIGH (ref 70–99)

## 2019-04-17 MED ORDER — METOPROLOL TARTRATE 12.5 MG HALF TABLET
12.5000 mg | ORAL_TABLET | Freq: Two times a day (BID) | ORAL | Status: DC
  Administered 2019-04-17 – 2019-05-25 (×75): 12.5 mg via ORAL
  Filled 2019-04-17 (×75): qty 1

## 2019-04-17 MED ORDER — POTASSIUM CHLORIDE 20 MEQ PO PACK
40.0000 meq | PACK | ORAL | Status: AC
  Administered 2019-04-17 (×2): 40 meq via ORAL
  Filled 2019-04-17 (×3): qty 2

## 2019-04-17 NOTE — Progress Notes (Signed)
PROGRESS NOTE    Richard Calhoun  [id number] DOB: 11-19-1960 DOA: 03/30/2019 PCP: Charlott Rakes, MD    Brief Narrative:  58 year old male who presented with hematemesis and abdominal pain. He does have significant past medical history for chronic alcohol abuse, chronic tobacco abuse, coronary artery disease status post angioplasty,chronic heart failure, dyslipidemia, peripheral vascular disease and paroxysmal tachycardia. He reported malaise and hematemesis.He had poor oral intake for last 3 days prior to hospitalization. He had worsening left flank pain, and unwitnessed fall, nausea and vomiting for several days. On his initial physical examination his blood pressure was 109/52, heart rate 78, oxygen saturation 96%, rotation, heart S1-S2 present rhythmic, his abdomen was soft, left-sided hematoma,no lower extremity edema. Sodium 139, potassium 4.9, chloride 99, bicarb 13, glucose 86, BUN 72, creatinine 3.6, AST 183, ALT 73, total bilirubins 2.6,white count 8.9, hemoglobin 8.5, hematocrit 26.0, platelets 39.SARS COVID-19 was negative. CT abdomen with and no acute changes.  Patient was admitted to the hospital with a working diagnosis of hematemesis due to upper GI bleed.  Patient was medically treated with IV Ativan, IV thiamine, Librium and IV octreotide/IV pantoprazole. He developed worsening mentation, failed noninvasive mechanical ventilation and was intubated September 13.  On September 16 patient underwent upper endoscopy showing esophageal varices and portal hypertensive gastropathy. His mentation improved and he was liberated from mechanical ventilation on September 18.  After liberation from mechanical ventilation patient continued to be encephalopathic, showing withdrawal symptoms, he required dexmedetomidine infusion along with benzodiazepines per protocol. He developed new onset atrial fibrillation with rapid ventricular response, his electrolytes have been  abnormal,and enteral nutrition was started by tube feeds on September 21.  Patient continue to be hypoactive and lethargic, but following commands, very weak and deconditioned.  Patient continue to be npo due to aspiration risk.  On 09/26, noted dislodged NG tube, worsening hypoxic respiratory failure, chest film with new left lower lobe infiltrate, started on antibiotic therapy and transferred to stepdown unit.  Follow up chest film confirming aspiration pneumonitis, held antibiotic therapy.  Mentation continue to improve, on 09.28, after swallow evaluation his diet has been advanced to dysphagia 3 with good toleration.    Assessment & Plan:   Principal Problem:   Acute hyperactive alcohol withdrawal delirium (HCC) Active Problems:   Anxiety state   EtOH dependence ([place])   Hypertensive heart disease without heart failure   S/P drug eluting coronary stent placement   Upper GI bleed   Acute renal failure (HCC)   Alcoholic hepatitis without ascites   Encephalopathy, hepatic (HCC)   Encounter for central line placement   Acute hypernatremia   Metabolic acidosis   Thrombocytopenia (HCC)   Left arm swelling   Hypomagnesemia   Chronic diastolic CHF (congestive heart failure) (HCC)   Macrocytic anemia   Atrial fibrillation, rapid ([place])   1. New left lower lobe aspiration pneumonitis complicated with acute hypoxic respiratory failure. Continue to be more alert and responsive.  Oxygenation is 99% to 100% on room air. His diet has been advanced with good toleration. Continue with aspiration precautions.  1. Hypoactive delirium, (critical illness), hepatic encephalopathy.Continue to be more awake and alert, continues to follow commands, with no agitation. Weak voice with difficult communications. Continue enteral nutrition.   2. Atrial fibrillation with RVR/ 2018 preserved LV systolic function.Continue rate control with oral metoprolol. Not candidate for anticoagulation due  to fall risk.  3. Alcoholic hepatitis, in the setting of hepatic steatosis/ cirrhosis, due to alcohol abuse.No signs of persistent hepatic  encephalopathy. Continue to hold on lactulose. Enteral nutrition.   4. Upper GI bleed with acute blood loss anemia sp one unit PRBC.Change pantoprazole to po.His cell count has been stable with no signs of recurrent bleeding.   5. AKI with metabolic acidosis/ hypokalemia and hypomagnesemianew hypernatremia., Na trending down to 146 with K at 3,2 and serum bicarbonate at 23. Renal function serum cr at 1,08.  Continue hydration with D5W at 50 ml per H. Follow renal panel in am. Will continue K correction with Kcl 40 meq x2.   6. Thrombocytopenia.stable with no signs of bleeding.   7. Chronic diastolic heart failure/ coronary artery disease(2018 preserved LV systolic function).Continue metoprolol for rate control, no signs of clinical exacerbation. Positive edema likely due to low oncotic pressure due to hypoproteinemia.   8. T2DM.Fasting glucose is 111, tolerating well D5W IV. Will continue to hold on insulin therapy for now, poor oral intake and risk of hypoglycemia.  9. Calorie protein malnutrition. Unable to determine severity, will continue nutritional supplements.   DVT prophylaxis:scd Code Status:full Family Communication:no family at the bedside Disposition Plan/ discharge barriers:pending clinical improvement, swallow evaluation.       Body mass index is 27.08 kg/m. Malnutrition Type:  Nutrition Problem: Inadequate oral intake Etiology: inability to eat   Malnutrition Characteristics:  Signs/Symptoms: NPO status   Nutrition Interventions:  Interventions: Tube feeding, Prostat  RN Pressure Injury Documentation:     Consultants:   Nephrology   Critical care  GI  Procedures:   Upper endoscopy  Antimicrobials:       Subjective: Patient is feeling better, now is able to eat, his voice  continue to be very weak and continue to have difficulty communicating. No dyspnea or chest pain, no nausea or vomiting.   Objective: Vitals:   04/17/19 0400 04/17/19 0600 04/17/19 0700 04/17/19 0800  BP: (!) 102/49 (!) 112/52  127/61  Pulse: 87 83 74 76  Resp:      Temp: 98.7 F (37.1 C)   98.1 F (36.7 C)  TempSrc: Oral   Oral  SpO2: 96% 95% 97% 98%  Weight:      Height:        Intake/Output Summary (Last 24 hours) at 04/17/2019 0909 Last data filed at 04/17/2019 0800 Gross per 24 hour  Intake 1189.52 ml  Output 400 ml  Net 789.52 ml   Filed Weights   04/14/19 1816 04/15/19 0414 04/16/19 0357  Weight: 85.6 kg 86.7 kg 85.6 kg    Examination:   General: Not in pain or dyspnea, deconditioned  Neurology: Awake and alert, non focal. Weak voice , possible confusion but no agitation.   E ENT: mild pallor, no icterus, oral mucosa dry Cardiovascular: No JVD. S1-S2 present, rhythmic, no gallops, rubs, or murmurs. No lower extremity edema. Pulmonary: positive breath sounds bilaterally, adequate air movement, no wheezing, rhonchi or rales. Gastrointestinal. Abdomen with no organomegaly, non tender, no rebound or guarding Skin. No rashes Musculoskeletal: no joint deformities     Data Reviewed: I have personally reviewed following labs and imaging studies  CBC: Recent Labs  Lab 04/11/19 0415 04/16/19 0359  WBC 11.0* 10.8*  NEUTROABS  --  9.8*  HGB 8.0* 7.0*  HCT 24.8* 23.2*  MCV 114.8* 119.0*  PLT PLATELET CLUMPS NOTED ON SMEAR, COUNT APPEARS DECREASED PLATELET CLUMPS NOTED ON SMEAR, COUNT APPEARS DECREASED   Basic Metabolic Panel: Recent Labs  Lab 04/10/19 1618 04/11/19 0415 04/12/19 0324 04/13/19 0406 04/14/19 0500 04/15/19 0754 04/16/19 0359 04/17/19 0415  NA  --  147* 144 154* 153* 154* 149* 146*  K  --  2.8* 2.9* 3.9 3.9 4.3 3.6 3.2*  CL  --  117* 111 122* 120* 120* 118* 115*  CO2  --  20* 24 23 25 24 22 23   GLUCOSE  --  131* 148* 133* 150* 112* 244*  111*  BUN  --  25* 28* 27* 30* 32* 32* 30*  CREATININE  --  1.21 1.21 1.05 1.07 1.12 1.09 1.08  CALCIUM  --  7.6* 7.5* 8.2* 8.1* 8.1* 7.9* 7.7*  MG 1.8 1.7 1.9 1.9  --   --   --   --   PHOS 2.4* 3.4  --   --   --   --   --   --    GFR: Estimated Creatinine Clearance: 75.1 mL/min (by C-G formula based on SCr of 1.08 mg/dL). Liver Function Tests: Recent Labs  Lab 04/11/19 0415 04/12/19 0324  AST 58* 53*  ALT 34 31  ALKPHOS 201* 212*  BILITOT 2.1* 2.2*  PROT 6.0* 6.1*  ALBUMIN 2.0* 2.0*   No results for input(s): LIPASE, AMYLASE in the last 168 hours. No results for input(s): AMMONIA in the last 168 hours. Coagulation Profile: Recent Labs  Lab 04/11/19 0415  INR 1.6*   Cardiac Enzymes: No results for input(s): CKTOTAL, CKMB, CKMBINDEX, TROPONINI in the last 168 hours. BNP (last 3 results) No results for input(s): PROBNP in the last 8760 hours. HbA1C: No results for input(s): HGBA1C in the last 72 hours. CBG: Recent Labs  Lab 04/16/19 0758 04/16/19 1152 04/16/19 1538 04/16/19 1932 04/17/19 0013  GLUCAP 121* 108* 147* 117* 106*   Lipid Profile: No results for input(s): CHOL, HDL, LDLCALC, TRIG, CHOLHDL, LDLDIRECT in the last 72 hours. Thyroid Function Tests: No results for input(s): TSH, T4TOTAL, FREET4, T3FREE, THYROIDAB in the last 72 hours. Anemia Panel: No results for input(s): VITAMINB12, FOLATE, FERRITIN, TIBC, IRON, RETICCTPCT in the last 72 hours.    Radiology Studies: I have reviewed all of the imaging during this hospital visit personally     Scheduled Meds: . Chlorhexidine Gluconate Cloth  6 each Topical Daily  . feeding supplement (PRO-STAT SUGAR FREE 64)  30 mL Per Tube Daily  . Gerhardt's butt cream   Topical TID  . hydrocortisone  25 mg Rectal QHS  . mouth rinse  15 mL Mouth Rinse BID  . metoprolol tartrate  5 mg Intravenous Q6H  . pantoprazole (PROTONIX) IV  40 mg Intravenous Q24H  . potassium chloride  40 mEq Oral Q4H  . sodium chloride  flush  10-40 mL Intracatheter Q12H   Continuous Infusions: . sodium chloride 250 mL (04/16/19 2156)  . dextrose 50 mL/hr at 04/17/19 0800  . feeding supplement (OSMOLITE 1.2 CAL) Stopped (04/14/19 1700)     LOS: 18 days        Blannie Shedlock Gerome Apley, MD

## 2019-04-18 LAB — CBC WITH DIFFERENTIAL/PLATELET
Abs Immature Granulocytes: 0.06 10*3/uL (ref 0.00–0.07)
Basophils Absolute: 0 10*3/uL (ref 0.0–0.1)
Basophils Relative: 0 %
Eosinophils Absolute: 0.1 10*3/uL (ref 0.0–0.5)
Eosinophils Relative: 1 %
HCT: 21.9 % — ABNORMAL LOW (ref 39.0–52.0)
Hemoglobin: 6.9 g/dL — CL (ref 13.0–17.0)
Immature Granulocytes: 1 %
Lymphocytes Relative: 8 %
Lymphs Abs: 0.6 10*3/uL — ABNORMAL LOW (ref 0.7–4.0)
MCH: 37.3 pg — ABNORMAL HIGH (ref 26.0–34.0)
MCHC: 31.5 g/dL (ref 30.0–36.0)
MCV: 118.4 fL — ABNORMAL HIGH (ref 80.0–100.0)
Monocytes Absolute: 0.3 10*3/uL (ref 0.1–1.0)
Monocytes Relative: 4 %
Neutro Abs: 7.1 10*3/uL (ref 1.7–7.7)
Neutrophils Relative %: 86 %
Platelets: DECREASED 10*3/uL (ref 150–400)
RBC: 1.85 MIL/uL — ABNORMAL LOW (ref 4.22–5.81)
RDW: 17.5 % — ABNORMAL HIGH (ref 11.5–15.5)
WBC: 8.2 10*3/uL (ref 4.0–10.5)
nRBC: 0 % (ref 0.0–0.2)

## 2019-04-18 LAB — BASIC METABOLIC PANEL
Anion gap: 6 (ref 5–15)
BUN: 26 mg/dL — ABNORMAL HIGH (ref 6–20)
CO2: 21 mmol/L — ABNORMAL LOW (ref 22–32)
Calcium: 7.6 mg/dL — ABNORMAL LOW (ref 8.9–10.3)
Chloride: 114 mmol/L — ABNORMAL HIGH (ref 98–111)
Creatinine, Ser: 1.05 mg/dL (ref 0.61–1.24)
GFR calc Af Amer: >60 mL/min (ref >60)
GFR calc non Af Amer: >60 mL/min (ref >60)
Glucose, Bld: 105 mg/dL — ABNORMAL HIGH (ref 70–99)
Potassium: 3.8 mmol/L (ref 3.5–5.1)
Sodium: 141 mmol/L (ref 135–145)

## 2019-04-18 LAB — PREPARE RBC (CROSSMATCH)

## 2019-04-18 MED ORDER — SODIUM CHLORIDE 0.9% IV SOLUTION: Freq: Once | INTRAVENOUS | Status: DC

## 2019-04-18 MED ORDER — PANTOPRAZOLE SODIUM 40 MG PO TBEC
40.0000 mg | DELAYED_RELEASE_TABLET | Freq: Two times a day (BID) | ORAL | Status: DC
  Administered 2019-04-18 – 2019-05-11 (×45): 40 mg via ORAL
  Filled 2019-04-18 (×45): qty 1

## 2019-04-18 MED ORDER — ADULT MULTIVITAMIN W/MINERALS CH
1.0000 | ORAL_TABLET | Freq: Every day | ORAL | Status: DC
  Administered 2019-04-18 – 2019-05-25 (×37): 1 via ORAL
  Filled 2019-04-18 (×38): qty 1

## 2019-04-18 MED ORDER — PRO-STAT SUGAR FREE PO LIQD
30.0000 mL | Freq: Every day | ORAL | Status: DC
  Administered 2019-04-18 – 2019-04-23 (×5): 30 mL via ORAL
  Filled 2019-04-18 (×5): qty 30

## 2019-04-18 MED ORDER — ENSURE ENLIVE PO LIQD
237.0000 mL | Freq: Two times a day (BID) | ORAL | Status: DC
  Administered 2019-04-18 (×2): 237 mL via ORAL

## 2019-04-18 NOTE — Progress Notes (Signed)
CRITICAL VALUE ALERT  Critical Value: hg-6.9  Date & Time Notied: 04/18/2019 at 05:15am  Provider Notified: Lamar Blinks Orders Received/Actions taken: awaiting for new orders.

## 2019-04-18 NOTE — Progress Notes (Signed)
PT Cancellation Note  Patient Details Name: Richard Calhoun MRN: [id number] DOB: October 09, 1960   Cancelled Treatment:     HgB 6.9 pt scheduled to receive 2 units.  Pt has been evaluated with rec for SNF.  Will cont to follow during his acute stay.    Rica Koyanagi  PTA Acute  Rehabilitation Services Pager      [phone] Office      [phone]

## 2019-04-18 NOTE — Progress Notes (Signed)
Pt was made aware re: HG-6.9 Pt has 2 consents for blood transfusion. Type and screening pending at this time.

## 2019-04-18 NOTE — Progress Notes (Signed)
PROGRESS NOTE    Richard Calhoun  [id number] DOB: 08-17-1960 DOA: 03/30/2019 PCP: Charlott Rakes, MD    Brief Narrative:   58 year old male who presented with hematemesis and abdominal pain. He does have significant past medical history for chronic alcohol abuse, chronic tobacco abuse, coronary artery disease status post angioplasty,chronic heart failure, dyslipidemia, peripheral vascular disease and paroxysmal tachycardia. He reported malaise and hematemesis.He had poor oral intake for last 3 days prior to hospitalization. He had worsening left flank pain, and unwitnessed fall, nausea and vomiting for several days. On his initial physical examination his blood pressure was 109/52, heart rate 78, oxygen saturation 96%, rotation, heart S1-S2 present rhythmic, his abdomen was soft, left-sided hematoma,no lower extremity edema.  Sodium 139, potassium 4.9, chloride 99, bicarb 13, glucose 86, BUN 72, creatinine 3.6, AST 183, ALT 73, total bilirubins 2.6,white count 8.9, hemoglobin 8.5, hematocrit 26.0, platelets 39.SARS COVID-19 was negative. CT abdomen with and no acute changes.  Patient was admitted to the hospital with a working diagnosis of hematemesis due to upper GI bleed.  Patient was medically treated with IV Ativan, IV thiamine, Librium and IV octreotide/IV pantoprazole. He developed worsening mentation, failed noninvasive mechanical ventilation and was intubated September 13.  On September 16 patient underwent upper endoscopy showing esophageal varices and portal hypertensive gastropathy. His mentation improved and he was liberated from mechanical ventilation on September 18.  After liberation from mechanical ventilation patient continued to be encephalopathic, showing withdrawal symptoms, he required dexmedetomidine infusion along with benzodiazepines per protocol. He developed new onset atrial fibrillation with rapid ventricular response, his electrolytes have been  abnormal,and enteral nutrition was started by tube feeds on September 21  Assessment & Plan:   Principal Problem:   Acute hyperactive alcohol withdrawal delirium ([place]) Active Problems:   Anxiety state   EtOH dependence ([place])   Hypertensive heart disease without heart failure   S/P drug eluting coronary stent placement   Upper GI bleed   Acute renal failure (HCC)   Alcoholic hepatitis without ascites   Encephalopathy, hepatic (HCC)   Encounter for central line placement   Acute hypernatremia   Metabolic acidosis   Thrombocytopenia (HCC)   Left arm swelling   Hypomagnesemia   Chronic diastolic CHF (congestive heart failure) (HCC)   Macrocytic anemia   Atrial fibrillation, rapid ([place])   Left lower lobe aspiration pneumonitis  Acute hypoxic respiratory failure. Continue to be more alert and responsive.  Oxygenation is 99%to 100% on room air. His diet has been advanced with good toleration.  --Continue with aspiration precautions.  Hypoactive delirium, (critical illness) Hepatic encephalopathy Continues to be more awake and alert, continues to follow commands, with no agitation. Weak voice with difficult communications.  --Enteral nutrition now has been transitioned to oral by speech therapy  Atrial fibrillation with RVR/ 2018 preserved LV systolic function. --Continue rate control with metoprolol 12.5mg  PO BID --Not candidate for anticoagulation due to fall risk.  Alcoholic hepatitis, in the setting of hepatic steatosis/ cirrhosis, due to alcohol abuse. No signs of persistent hepatic encephalopathy.Continue to hold on lactulose.  --Continue to encourage increased oral intake --Avoid hepatotoxins   Upper GI bleed with acute blood loss anemia EGD 9/16 grade 1 varices distal esophagus, severe diffuse portal hypertensive gastropathy, small nonbleeding erosions gastric antrum without stigmata of recent bleeding, small dispersed erosions without bleeding duodenal bulb.   H. pylori IgG antibodies within normal limits. --s/p transfusion 1u pRBC on 9/21 and 9/30, s/p 1u FFP on 9/15 --Protonix 40 mg p.o.  twice daily --Monitor CBC daily  Pancreatic lesion Incidental finding of a 4.8 by 3.3 cm lesion with heterogeneous internal attenuation in the tail of the pancreas on the CT abdomen/pelvis on 03/30/2019. --Will need further imaging of the pancreas with MRI/MRCP  AKI with metabolic acidosis/ hypokalemia and hypomagnesemianew hypernatremia., Na trending down to 141 with K at 3.8 and serum bicarbonate at 21. Renal function serum cr at 1.05.  --Continue hydration with D5W at 50 ml/hr --encouraged increase oral intake --BMP daily  Thrombocytopenia: stable with no signs of bleeding, monitor daily  Chronic diastolic heart failure/ coronary artery disease(2018 preserved LV systolic function). Edema likely secondary to low oncotic pressure secondary to hypoproteinemia. --Continue metoprolol for rate control  T2DM. Fasting glucose is 105, tolerating well D5W IV --continue to hold on insulin therapy for now, poor oral intake and risk of hypoglycemia.  Calorie protein malnutrition. Unable to determine severity --nutrition following, continue nutritional supplements.    DVT prophylaxis: SCDs Code Status: Full code Family Communication: No family present at bedside Disposition Plan: Continue inpatient, PT recommends SNF, social work for coordination   Consultants:   Gastroenterology: Signed off 04/10/2019  PCCM  Procedures:   EGD 04/04/2019  Antimicrobials:  none   Subjective: Patient seen and examined at bedside, resting comfortably.  Now transition to oral diet, still continues with poor intake.  Continues with very weak voice with difficulty communicating.  Also continues with profuse fatigue/weakness.  No other specific complaints this morning.  Denies headache, no fever/chills/night sweats, no chest pain, no palpitations, no abdominal  pain.  No acute events overnight per nursing staff.  Objective: Vitals:   04/18/19 0558 04/18/19 0800 04/18/19 0815 04/18/19 1118  BP: 105/84 112/77 104/63 113/64  Pulse: 88 96 99 98  Resp: 19 18 18 18   Temp: 97.6 F (36.4 C) 98.1 F (36.7 C) 97.8 F (36.6 C) 98.2 F (36.8 C)  TempSrc:    Oral  SpO2: 99% 100% 99% 99%  Weight:      Height:        Intake/Output Summary (Last 24 hours) at 04/18/2019 1233 Last data filed at 04/18/2019 1115 Gross per 24 hour  Intake 1318.82 ml  Output 201 ml  Net 1117.82 ml   Filed Weights   04/14/19 1816 04/15/19 0414 04/16/19 0357  Weight: 85.6 kg 86.7 kg 85.6 kg    Examination:  General exam: Appears calm and comfortable  Respiratory system: Coarse breath sounds bilaterally, slightly decreased bilateral bases, no wheezing/crackles, normal respiratory effort, on room air. Cardiovascular system: S1 & S2 heard, RRR. No JVD, murmurs, rubs, gallops or clicks. No pedal edema. Gastrointestinal system: Abdomen is nondistended, soft and nontender. No organomegaly or masses felt. Normal bowel sounds heard. Central nervous system: Alert and oriented. No focal neurological deficits. Extremities: Symmetric 5 x 5 power. Skin: No rashes, lesions or ulcers Psychiatry: Judgement and insight appear normal. Mood & affect appropriate.     Data Reviewed: I have personally reviewed following labs and imaging studies  CBC: Recent Labs  Lab 04/16/19 0359 04/18/19 0330  WBC 10.8* 8.2  NEUTROABS 9.8* 7.1  HGB 7.0* 6.9*  HCT 23.2* 21.9*  MCV 119.0* 118.4*  PLT PLATELET CLUMPS NOTED ON SMEAR, COUNT APPEARS DECREASED PLATELET CLUMPS NOTED ON SMEAR, COUNT APPEARS DECREASED   Basic Metabolic Panel: Recent Labs  Lab 04/12/19 0324 04/13/19 0406 04/14/19 0500 04/15/19 0754 04/16/19 0359 04/17/19 0415 04/18/19 0330  NA 144 154* 153* 154* 149* 146* 141  K 2.9* 3.9 3.9 4.3 3.6  3.2* 3.8  CL 111 122* 120* 120* 118* 115* 114*  CO2 24 23 25 24 22 23  21*    GLUCOSE 148* 133* 150* 112* 244* 111* 105*  BUN 28* 27* 30* 32* 32* 30* 26*  CREATININE 1.21 1.05 1.07 1.12 1.09 1.08 1.05  CALCIUM 7.5* 8.2* 8.1* 8.1* 7.9* 7.7* 7.6*  MG 1.9 1.9  --   --   --   --   --    GFR: Estimated Creatinine Clearance: 77.2 mL/min (by C-G formula based on SCr of 1.05 mg/dL). Liver Function Tests: Recent Labs  Lab 04/12/19 0324  AST 53*  ALT 31  ALKPHOS 212*  BILITOT 2.2*  PROT 6.1*  ALBUMIN 2.0*   No results for input(s): LIPASE, AMYLASE in the last 168 hours. No results for input(s): AMMONIA in the last 168 hours. Coagulation Profile: No results for input(s): INR, PROTIME in the last 168 hours. Cardiac Enzymes: No results for input(s): CKTOTAL, CKMB, CKMBINDEX, TROPONINI in the last 168 hours. BNP (last 3 results) No results for input(s): PROBNP in the last 8760 hours. HbA1C: No results for input(s): HGBA1C in the last 72 hours. CBG: Recent Labs  Lab 04/16/19 1152 04/16/19 1538 04/16/19 1932 04/17/19 0013 04/17/19 1208  GLUCAP 108* 147* 117* 106* 110*   Lipid Profile: No results for input(s): CHOL, HDL, LDLCALC, TRIG, CHOLHDL, LDLDIRECT in the last 72 hours. Thyroid Function Tests: No results for input(s): TSH, T4TOTAL, FREET4, T3FREE, THYROIDAB in the last 72 hours. Anemia Panel: No results for input(s): VITAMINB12, FOLATE, FERRITIN, TIBC, IRON, RETICCTPCT in the last 72 hours. Sepsis Labs: No results for input(s): PROCALCITON, LATICACIDVEN in the last 168 hours.  Recent Results (from the past 240 hour(s))  Culture, blood (routine x 2)     Status: None   Collection Time: 04/09/19 10:10 AM   Specimen: BLOOD  Result Value Ref Range Status   Specimen Description   Final    BLOOD LEFT ARM Performed at [facility] [address] [place], [place] [address]    Special Requests   Final    BOTTLES DRAWN AEROBIC AND ANAEROBIC Blood Culture adequate volume Performed at [facility] [address], [place], [place] [address]    Culture   Final    NO GROWTH 5 DAYS Performed at [facility], [place] [address], [place], [place] [address]    Report Status 04/14/2019 FINAL  Final  Culture, blood (routine x 2)     Status: None   Collection Time: 04/09/19 10:12 AM   Specimen: BLOOD  Result Value Ref Range Status   Specimen Description   Final    BLOOD LEFT ARM Performed at [facility] [address], [place], [place] [address]    Special Requests   Final    BOTTLES DRAWN AEROBIC AND ANAEROBIC Blood Culture adequate volume Performed at [facility] [address] [place], [place] [address]    Culture   Final    NO GROWTH 5 DAYS Performed at [facility], [place] [address] [place], [place] [address]    Report Status 04/14/2019 FINAL  Final         Radiology Studies: Dg Swallowing Func-speech Pathology  Result Date: 04/16/2019 Objective Swallowing Evaluation: Type of Study: MBS-Modified Barium Swallow Study  Patient Details Name: DAVRON PANCIERA MRN: [id number] Date of Birth: [birth year] Today's Date: 04/16/2019 Time: SLP Start Time (ACUTE ONLY): [id number] -SLP Stop Time (ACUTE ONLY): 1301 SLP  Time Calculation (min) (ACUTE ONLY): 23 min Past Medical History: Past Medical History: Diagnosis Date  Acute renal failure ([place])   Alcoholic hepatitis without ascites   ALLERGIC RHINITIS   Anemia   Anginal pain (HCC)   ANXIETY   BICEPS TENDON RUPTURE, RIGHT   CAD S/P percutaneous coronary angioplasty 03/2017  Cath: mRCA 99-100% CTO (calcific w/ L-R collaterals), p-mLAD calcified 60-80% (FFR 0.74 after D2), 50% mCx. --> Staged Atherectomy-DES PCI of p-mLAD (Synergy DES 3.0 x 24 -- 3.3 mm)  Chronic diastolic CHF (congestive heart failure) ([place]) 04/07/2019  DEPENDENCE, ALCOHOL NEC/NOS, UNSPECIFIED   DEPRESSION   GERD   Headache   HYPERLIPIDEMIA   HYPERTENSION   Impaired glucose tolerance   OSTEOARTHRITIS   Tubular adenoma of colon 06/2006 Past  Surgical History: Past Surgical History: Procedure Laterality Date  COLONOSCOPY W/ POLYPECTOMY  06/2006  Dr Fuller Plan.  For hematochezia. 5 mm, sessile polyp (tubular adenoma, no HGD).  injected saline to internal hemorrhoids.  Did not respond to fup 2012, 2014 colonoscopy.    CORONARY ANGIOPLASTY    CORONARY ATHERECTOMY N/A 04/20/2017  Wellington Hampshire, MD;  p-mLAD (Orbital)  CORONARY STENT INTERVENTION N/A 04/20/2017  Wellington Hampshire, MD;  Location: [facility].  Staged Orbital Atherectomy-DES PCI of p-mLAD (Synergy DES 3.0 x 24 -- 3.3 mm)  ESOPHAGOGASTRODUODENOSCOPY  2000  Dr Laurence Spates.  Hiatal hernia.  ESOPHAGOGASTRODUODENOSCOPY (EGD) WITH PROPOFOL N/A 04/04/2019  Procedure: ESOPHAGOGASTRODUODENOSCOPY (EGD) WITH PROPOFOL;  Surgeon: Rush Landmark Telford Nab., MD;  Location: [facility];  Service: Gastroenterology;  Laterality: N/A;  bedside-on vent  FOOT SURGERY    INTRAVASCULAR PRESSURE WIRE/FFR STUDY N/A 04/15/2017  Procedure: INTRAVASCULAR PRESSURE WIRE/FFR STUDY; Belva Crome, MD; LAD = 0.74 after D2  LEFT HEART CATH AND CORONARY ANGIOGRAPHY N/A 04/15/2017  MANDIBLE FRACTURE SURGERY    TRANSTHORACIC ECHOCARDIOGRAM  01/2017  Moderate LVH.  EF 60 to 65%.  No R WMA.  GR 1 DD. Mild AI.  HPI: 58 y.o. year old male admitted 03/30/2019 with reports of worsening left flank pain, unwitnessed fall, nausea and vomiting for several days and episode of hematemesis, concern for upper GI bleed causing AKI. PMH: chronic alcohol abuse, CAD s/p DES (2018), CHFpEF, HLD. Pt intubated 9/13-18/2020.  Pt has been npo with likely secretion aspiration and has had an NG placed.  NG was removed over the weekend at it was coiled in his nose.MD notified SLP of need for pt to have an evaluation to determine if he can consume po intake due to length of npo, hospital stay.  Subjective: pt resting in chair, no family present Assessment / Plan / Recommendation CHL IP CLINICAL IMPRESSIONS 04/16/2019 Clinical Impression Patient presents with  mild oropharyngel dysphagia characterized by delayed oral transiting and decreased propulsion.  Pharyngeal swallow is strong without residuals even after her consumed 5 boluses of applesauce *without flouro* but decreased laryngeal closure results in laryngeal penetration and trace aspiration of thin.  Chin tuck prevented aspiration with straw.  Recommend dys3/thin with full precautions due to his respiratory status. Will follow up.   SLP Visit Diagnosis Dysphagia, unspecified (R13.10) Attention and concentration deficit following -- Frontal lobe and executive function deficit following -- Impact on safety and function Moderate aspiration risk;Severe aspiration risk   CHL IP TREATMENT RECOMMENDATION 04/16/2019 Treatment Recommendations Therapy as outlined in treatment plan below   Prognosis 04/16/2019 Prognosis for Safe Diet Advancement Fair Barriers to Reach Goals Cognitive deficits;Severity of deficits Barriers/Prognosis Comment -- CHL IP DIET RECOMMENDATION 04/16/2019 SLP Diet Recommendations Dysphagia  3 (Mech soft) solids;Thin liquid Liquid Administration via Cup;Straw Medication Administration Whole meds with puree Compensations Minimize environmental distractions;Slow rate;Small sips/bites Postural Changes Seated upright at 90 degrees;Remain semi-upright after after feeds/meals (Comment)   CHL IP OTHER RECOMMENDATIONS 04/16/2019 Recommended Consults -- Oral Care Recommendations Oral care BID Other Recommendations --   CHL IP FOLLOW UP RECOMMENDATIONS 04/16/2019 Follow up Recommendations Skilled Nursing facility   [facility] IP FREQUENCY AND DURATION 04/16/2019 Speech Therapy Frequency (ACUTE ONLY) min 2x/week Treatment Duration 2 weeks      CHL IP ORAL PHASE 04/16/2019 Oral Phase Impaired Oral - Pudding Teaspoon -- Oral - Pudding Cup -- Oral - Honey Teaspoon -- Oral - Honey Cup -- Oral - Nectar Teaspoon -- Oral - Nectar Cup Delayed oral transit Oral - Nectar Straw Delayed oral transit Oral - Thin Teaspoon Delayed oral  transit Oral - Thin Cup Delayed oral transit Oral - Thin Straw Delayed oral transit Oral - Puree Delayed oral transit;Lingual/palatal residue Oral - Mech Soft Delayed oral transit;Lingual/palatal residue Oral - Regular -- Oral - Multi-Consistency -- Oral - Pill Delayed oral transit Oral Phase - Comment mild oral residuals cleared with dry swallows  CHL IP PHARYNGEAL PHASE 04/16/2019 Pharyngeal Phase Impaired Pharyngeal- Pudding Teaspoon -- Pharyngeal -- Pharyngeal- Pudding Cup -- Pharyngeal -- Pharyngeal- Honey Teaspoon -- Pharyngeal -- Pharyngeal- Honey Cup -- Pharyngeal -- Pharyngeal- Nectar Teaspoon -- Pharyngeal -- Pharyngeal- Nectar Cup [facility] Pharyngeal Material does not enter airway Pharyngeal- Nectar Straw WFL Pharyngeal Material does not enter airway Pharyngeal- Thin Teaspoon -- Pharyngeal -- Pharyngeal- Thin Cup Reduced airway/laryngeal closure;Penetration/Aspiration during swallow Pharyngeal Material enters airway, passes BELOW cords without attempt by patient to eject out (silent aspiration) Pharyngeal- Thin Straw Reduced airway/laryngeal closure;Compensatory strategies attempted (with notebox) Pharyngeal Material does not enter airway Pharyngeal- Puree WFL Pharyngeal Material does not enter airway Pharyngeal- Mechanical Soft -- Pharyngeal -- Pharyngeal- Regular WFL Pharyngeal Material does not enter airway Pharyngeal- Multi-consistency -- Pharyngeal -- Pharyngeal- Pill WFL Pharyngeal Material does not enter airway Pharyngeal Comment minimal amount of pharyngeal residuals mix with secretions  CHL IP CERVICAL ESOPHAGEAL PHASE 04/16/2019 Cervical Esophageal Phase Impaired Pudding Teaspoon -- Pudding Cup -- Honey Teaspoon -- Honey Cup -- Nectar Teaspoon -- Nectar Cup WFL Nectar Straw WFL Thin Teaspoon -- Thin Cup WFL Thin Straw WFL Puree WFL Mechanical Soft -- Regular -- Multi-consistency WFL Pill -- Cervical Esophageal Comment pt appeared with mild residuals in esophagus without sensation - suspect component of  dysmotility; barium tablet provided with puree appeared to transit through esophagus easily; radiologist not present to confirm Luanna Salk, [facility] SLP [place] Pager [phone] Office [phone] Macario Golds 04/16/2019, 2:16 PM                   Scheduled Meds:  sodium chloride   Intravenous Once   Chlorhexidine Gluconate Cloth  6 each Topical Daily   feeding supplement (ENSURE ENLIVE)  237 mL Oral BID BM   feeding supplement (PRO-STAT SUGAR FREE 64)  30 mL Oral Daily   Gerhardt's butt cream   Topical TID   hydrocortisone  25 mg Rectal QHS   mouth rinse  15 mL Mouth Rinse BID   metoprolol tartrate  12.5 mg Oral BID   multivitamin with minerals  1 tablet Oral Daily   pantoprazole (PROTONIX) IV  40 mg Intravenous Q24H   sodium chloride flush  10-40 mL Intracatheter Q12H   Continuous Infusions:  sodium chloride 250 mL (04/16/19 2156)   dextrose 50 mL/hr  at 04/18/19 0715     LOS: 19 days    Time spent: 34 minutes spent on chart review, discussion with nursing staff, consultants, updating family and interview/physical exam; more than 50% of that time was spent in counseling and/or coordination of care.    Ginny Loomer J [place] DO [facility] Pager [phone]  If 7PM-7AM, please contact night-coverage [email or web address] Password [facility] 04/18/2019, 12:33 PM

## 2019-04-18 NOTE — Progress Notes (Signed)
Nutrition Follow-up  RD working remotely.   DOCUMENTATION CODES:   Not applicable  INTERVENTION:  - will order Ensure Enlive BID, each supplement provides 350 kcal and 20 grams of protein. - continue 30 mL Prostat once/day, each supplement provides 100 kcal and 15 grams of protein. - will order daily multivitamin with minerals. - continue to encourage PO intakes.    NUTRITION DIAGNOSIS:   Increased nutrient needs related to acute illness as evidenced by estimated needs. -revised  GOAL:   Patient will meet greater than or equal to 90% of their needs -progressing  MONITOR:   PO intake, Supplement acceptance, Labs, Weight trends  ASSESSMENT:   58 year-old male with medical history of chronic alcohol abuse (half gallon of rum/day), CAD and PCI, unstable angina with stent placement in 2018, CHF, hyperlipidemia, mild peripheral vascular disease, and tachycardia. Patient presented to the ED on 9/11 d/t increasing malaise and increased sleeping duration x5 days, vomiting and poor PO intakes x3 days.  Significant Events: 9/11- admission 9/13- intubation; OGT placement 9/14- initial RD assessment 9/16- OGT removed and replaced 9/18- extubation and OGT removed 9/19- diet advanced from NPO to [place] 9/20- returned to NPO 9/22- NGT placement; TF initiation  9/26- TF stopped; NGT removed 9/28- diet advanced to Dysphagia 3, thin liquids   Weight has been relatively stable the past 4 days. Per flow sheet, he consumed 75% of breakfast this AM.   Per notes: - new LLL aspiration PNA--resolved, aspiration precautions in place - hypoactive delirium, hepatic encephalopathy--patient now a/o x4 - afib with RVR - alcoholic hepatitis in the setting of hepatic steatosis/cirrhosis - UGIB with acute blood loss anemia s/p 1 unit PRBC--now resolved, no further bleeding - AKI - thrombocytopenia--stable    Labs reviewed; Cl; 114 mmol/l, BUN: 26 mg/dl, Ca: 7.6 mg/dl. Medications reviewed. IVF;  D5 @ 50 ml/hr (204 kcal).    Diet Order:   Diet Order            DIET DYS 3 Room service appropriate? No; Fluid consistency: Thin  Diet effective now              EDUCATION NEEDS:   No education needs have been identified at this time  Skin:  Skin Assessment: Reviewed RN Assessment  Last BM:  9/28  Height:   Ht Readings from Last 1 Encounters:  04/14/19 5\' 10"  (1.778 m)    Weight:   Wt Readings from Last 1 Encounters:  04/16/19 85.6 kg    Ideal Body Weight:  75.4 kg  BMI:  Body mass index is 27.08 kg/m.  Estimated Nutritional Needs:   Kcal:  1900-2100 kcal  Protein:  95-105 grams  Fluid:  >/= 2 L/day     Jarome Matin, MS, RD, LDN, [facility] Inpatient Clinical Dietitian Pager # [phone] After hours/weekend pager # [phone]

## 2019-04-18 NOTE — Plan of Care (Signed)
°  Problem: Education: °Goal: Knowledge of General Education information will improve °Description: Including pain rating scale, medication(s)/side effects and non-pharmacologic comfort measures °Outcome: Progressing °  °Problem: Health Behavior/Discharge Planning: °Goal: Ability to manage health-related needs will improve °Outcome: Progressing °  °Problem: Nutrition: °Goal: Adequate nutrition will be maintained °Outcome: Progressing °  °Problem: Skin Integrity: °Goal: Risk for impaired skin integrity will decrease °Outcome: Progressing °  °

## 2019-04-19 LAB — CBC
HCT: 24.9 % — ABNORMAL LOW (ref 39.0–52.0)
Hemoglobin: 8 g/dL — ABNORMAL LOW (ref 13.0–17.0)
MCH: 37.2 pg — ABNORMAL HIGH (ref 26.0–34.0)
MCHC: 32.1 g/dL (ref 30.0–36.0)
MCV: 115.8 fL — ABNORMAL HIGH (ref 80.0–100.0)
Platelets: UNDETERMINED 10*3/uL (ref 150–400)
RBC: 2.15 MIL/uL — ABNORMAL LOW (ref 4.22–5.81)
RDW: 17.4 % — ABNORMAL HIGH (ref 11.5–15.5)
WBC: 9.1 10*3/uL (ref 4.0–10.5)
nRBC: 0 % (ref 0.0–0.2)

## 2019-04-19 LAB — BASIC METABOLIC PANEL
Anion gap: 7 (ref 5–15)
BUN: 19 mg/dL (ref 6–20)
CO2: 20 mmol/L — ABNORMAL LOW (ref 22–32)
Calcium: 7.7 mg/dL — ABNORMAL LOW (ref 8.9–10.3)
Chloride: 110 mmol/L (ref 98–111)
Creatinine, Ser: 0.86 mg/dL (ref 0.61–1.24)
GFR calc Af Amer: >60 mL/min (ref >60)
GFR calc non Af Amer: >60 mL/min (ref >60)
Glucose, Bld: 121 mg/dL — ABNORMAL HIGH (ref 70–99)
Potassium: 3.5 mmol/L (ref 3.5–5.1)
Sodium: 137 mmol/L (ref 135–145)

## 2019-04-19 LAB — TYPE AND SCREEN
ABO/RH(D): A POS
Antibody Screen: NEGATIVE
Unit division: 0

## 2019-04-19 LAB — BPAM RBC
Blood Product Expiration Date: 202010212359
ISSUE DATE / TIME: 202009300754
Unit Type and Rh: 6200

## 2019-04-19 LAB — MAGNESIUM: Magnesium: 1.3 mg/dL — ABNORMAL LOW (ref 1.7–2.4)

## 2019-04-19 MED ORDER — ALUM & MAG HYDROXIDE-SIMETH 200-200-20 MG/5ML PO SUSP
15.0000 mL | Freq: Four times a day (QID) | ORAL | Status: DC | PRN
  Administered 2019-04-19 – 2019-04-25 (×4): 15 mL via ORAL
  Filled 2019-04-19 (×4): qty 30

## 2019-04-19 MED ORDER — POTASSIUM CHLORIDE 20 MEQ/15ML (10%) PO SOLN
40.0000 meq | Freq: Once | ORAL | Status: AC
  Administered 2019-04-19: 08:00:00 40 meq via ORAL
  Filled 2019-04-19: qty 30

## 2019-04-19 MED ORDER — MAGNESIUM SULFATE 2 GM/50ML IV SOLN
2.0000 g | INTRAVENOUS | Status: AC
  Administered 2019-04-19 (×2): 2 g via INTRAVENOUS
  Filled 2019-04-19 (×2): qty 50

## 2019-04-19 MED ORDER — MENTHOL 3 MG MT LOZG: 1.0000 | LOZENGE | OROMUCOSAL | Status: DC | PRN

## 2019-04-19 MED ORDER — PHENOL 1.4 % MT LIQD
1.0000 | OROMUCOSAL | Status: DC | PRN
  Filled 2019-04-19 (×2): qty 177

## 2019-04-19 NOTE — Progress Notes (Signed)
SLP Cancellation Note  Patient Details Name: Richard Calhoun MRN: [id number] DOB: December 31, 1960   Cancelled treatment:       Reason Eval/Treat Not Completed: Other (comment)(pt sound asleep)  Luanna Salk, MS [facility] SLP Acute Rehab Services Pager [phone] Office [phone]   Richard Calhoun 04/19/2019, 10:36 AM

## 2019-04-19 NOTE — Plan of Care (Signed)
  Problem: Clinical Measurements: Goal: Will remain free from infection Outcome: Progressing Goal: Respiratory complications will improve Outcome: Progressing Goal: Cardiovascular complication will be avoided Outcome: Progressing   Problem: Nutrition: Goal: Adequate nutrition will be maintained Outcome: Progressing   Problem: Coping: Goal: Level of anxiety will decrease Outcome: Progressing   Problem: Elimination: Goal: Will not experience complications related to bowel motility Outcome: Progressing Goal: Will not experience complications related to urinary retention Outcome: Progressing   Problem: Pain Managment: Goal: General experience of comfort will improve Outcome: Progressing   Problem: Safety: Goal: Ability to remain free from injury will improve Outcome: Progressing

## 2019-04-19 NOTE — Progress Notes (Signed)
SLP Cancellation Note  Patient Details Name: DOIS BILLING MRN: [id number] DOB: Dec 17, 1960   Cancelled treatment:       Reason Eval/Treat Not Completed: Other (comment)(2nd attempt, pt continues to be asleep)   Macario Golds 04/19/2019, 12:43 PM  Luanna Salk, [facility] SLP Acute Rehab Services Pager [phone] Office [phone]

## 2019-04-19 NOTE — Progress Notes (Signed)
PROGRESS NOTE    Richard Calhoun  [id number] DOB: [birth year] DOA: 03/30/2019 PCP: Charlott Rakes, MD    Brief Narrative:   58 year old male who presented with hematemesis and abdominal pain. He does have significant past medical history for chronic alcohol abuse, chronic tobacco abuse, coronary artery disease status post angioplasty,chronic heart failure, dyslipidemia, peripheral vascular disease and paroxysmal tachycardia. He reported malaise and hematemesis.He had poor oral intake for last 3 days prior to hospitalization. He had worsening left flank pain, and unwitnessed fall, nausea and vomiting for several days. On his initial physical examination his blood pressure was 109/52, heart rate 78, oxygen saturation 96%, rotation, heart S1-S2 present rhythmic, his abdomen was soft, left-sided hematoma,no lower extremity edema.  Sodium 139, potassium 4.9, chloride 99, bicarb 13, glucose 86, BUN 72, creatinine 3.6, AST 183, ALT 73, total bilirubins 2.6,white count 8.9, hemoglobin 8.5, hematocrit 26.0, platelets 39.SARS COVID-19 was negative. CT abdomen with and no acute changes.  Patient was admitted to the hospital with a working diagnosis of hematemesis due to upper GI bleed.  Patient was medically treated with IV Ativan, IV thiamine, Librium and IV octreotide/IV pantoprazole. He developed worsening mentation, failed noninvasive mechanical ventilation and was intubated September 13.  On September 16 patient underwent upper endoscopy showing esophageal varices and portal hypertensive gastropathy. His mentation improved and he was liberated from mechanical ventilation on September 18.  After liberation from mechanical ventilation patient continued to be encephalopathic, showing withdrawal symptoms, he required dexmedetomidine infusion along with benzodiazepines per protocol. He developed new onset atrial fibrillation with rapid ventricular response, his electrolytes have been  abnormal,and enteral nutrition was started by tube feeds on September 21  Assessment & Plan:   Principal Problem:   Acute hyperactive alcohol withdrawal delirium ([place]) Active Problems:   Anxiety state   EtOH dependence ([place])   Hypertensive heart disease without heart failure   S/P drug eluting coronary stent placement   Upper GI bleed   Acute renal failure (HCC)   Alcoholic hepatitis without ascites   Encephalopathy, hepatic (HCC)   Encounter for central line placement   Acute hypernatremia   Metabolic acidosis   Thrombocytopenia (HCC)   Left arm swelling   Hypomagnesemia   Chronic diastolic CHF (congestive heart failure) (HCC)   Macrocytic anemia   Atrial fibrillation, rapid ([place])   Left lower lobe aspiration pneumonitis  Acute hypoxic respiratory failure. Continue to be more alert and responsive.  His diet has been advanced with good toleration.  --Currently oxygenating well on room air --Continue with aspiration precautions.  Hypoactive delirium, likely of critical illness Hepatic encephalopathy Continues to be more awake and alert, continues to follow commands, with no agitation. Weak voice with difficult communications.  --Enteral nutrition now has been transitioned to oral by speech therapy --Continue to monitor mental status closely --Aspiration precautions as above  Atrial fibrillation with RVR/ 2018 preserved LV systolic function. --Continue rate control with metoprolol 12.5mg  PO BID --Not candidate for anticoagulation due to fall risk and upper GI bleed  Alcoholic hepatitis, in the setting of hepatic steatosis/ cirrhosis, due to alcohol abuse. No signs of persistent hepatic encephalopathy. --Continue to hold on lactulose.  --Continue to encourage increased oral intake --Avoid hepatotoxins   Upper GI bleed with acute blood loss anemia EGD 9/16 grade 1 varices distal esophagus, severe diffuse portal hypertensive gastropathy, small nonbleeding  erosions gastric antrum without stigmata of recent bleeding, small dispersed erosions without bleeding duodenal bulb.  H. pylori IgG antibodies within normal limits. --Hgb  8.0 this am --s/p transfusion 1u pRBC on 9/21 and 9/30, s/p 1u FFP on 9/15 --Protonix 40 mg p.o. twice daily --Monitor CBC daily  Pancreatic lesion Incidental finding of a 4.8 by 3.3 cm lesion with heterogeneous internal attenuation in the tail of the pancreas on the CT abdomen/pelvis on 03/30/2019. --Will need further imaging of the pancreas with MRI/MRCP  AKI with metabolic acidosis/ hypokalemia and hypomagnesemianew hypernatremia.,  --Na 137 this am --Discontinue D5W today --encouraged increase oral intake --BMP daily  Thrombocytopenia: stable with no signs of bleeding, monitor daily  Chronic diastolic heart failure/ coronary artery disease(2018 preserved LV systolic function). Edema likely secondary to low oncotic pressure secondary to hypoproteinemia. --Continue metoprolol for rate control  T2DM. Fasting glucose is 121 today --continue to hold on insulin therapy for now, poor oral intake and risk of hypoglycemia.  Calorie protein malnutrition, Unable to determine severity --nutrition following, continue nutritional supplements.    DVT prophylaxis: SCDs Code Status: Full code Family Communication: No family present at bedside Disposition Plan: Continue inpatient, PT recommends SNF, social work for coordination   Consultants:   Gastroenterology: Signed off 04/10/2019  PCCM  Procedures:   EGD 04/04/2019  Antimicrobials:  none   Subjective: Patient seen and examined at bedside, resting comfortably.  States appetite slightly improved, although continues with poor oral intake.  Continues with very weak voice with difficulty communicating.  Also continues with profuse fatigue/weakness.  No other specific complaints this morning.  Denies headache, no fever/chills/night sweats, no chest pain,  no palpitations, no abdominal pain.  No acute events overnight per nursing staff.  Objective: Vitals:   04/18/19 1452 04/18/19 2236 04/19/19 0634 04/19/19 0943  BP: 118/68 (!) 120/58 99/66 (!) 111/53  Pulse: 88 92 91 89  Resp:  20 20 14   Temp:  98.2 F (36.8 C) 98.5 F (36.9 C) 98.3 F (36.8 C)  TempSrc:    Oral  SpO2:  99% 100% 96%  Weight:      Height:        Intake/Output Summary (Last 24 hours) at 04/19/2019 1138 Last data filed at 04/19/2019 1000 Gross per 24 hour  Intake 1980.46 ml  Output 400 ml  Net 1580.46 ml   Filed Weights   04/14/19 1816 04/15/19 0414 04/16/19 0357  Weight: 85.6 kg 86.7 kg 85.6 kg    Examination:  General exam: Appears calm and comfortable  Respiratory system: Coarse breath sounds bilaterally, slightly decreased bilateral bases, no wheezing/crackles, normal respiratory effort, on room air. Cardiovascular system: S1 & S2 heard, RRR. No JVD, murmurs, rubs, gallops or clicks. No pedal edema. Gastrointestinal system: Abdomen is nondistended, soft and nontender. No organomegaly or masses felt. Normal bowel sounds heard. Central nervous system: Alert and oriented. No focal neurological deficits. Extremities: Symmetric 5 x 5 power. Skin: No rashes, lesions or ulcers Psychiatry: Judgement and insight appear normal. Mood & affect appropriate.     Data Reviewed: I have personally reviewed following labs and imaging studies  CBC: Recent Labs  Lab 04/16/19 0359 04/18/19 0330 04/19/19 0308  WBC 10.8* 8.2 9.1  NEUTROABS 9.8* 7.1  --   HGB 7.0* 6.9* 8.0*  HCT 23.2* 21.9* 24.9*  MCV 119.0* 118.4* 115.8*  PLT PLATELET CLUMPS NOTED ON SMEAR, COUNT APPEARS DECREASED PLATELET CLUMPS NOTED ON SMEAR, COUNT APPEARS DECREASED PLATELET CLUMPS NOTED ON SMEAR, UNABLE TO ESTIMATE   Basic Metabolic Panel: Recent Labs  Lab 04/13/19 0406  04/15/19 0754 04/16/19 0359 04/17/19 0415 04/18/19 0330 04/19/19 0308  NA 154*   < >  154* 149* 146* 141 137  K 3.9    < > 4.3 3.6 3.2* 3.8 3.5  CL 122*   < > 120* 118* 115* 114* 110  CO2 23   < > 24 22 23  21* 20*  GLUCOSE 133*   < > 112* 244* 111* 105* 121*  BUN 27*   < > 32* 32* 30* 26* 19  CREATININE 1.05   < > 1.12 1.09 1.08 1.05 0.86  CALCIUM 8.2*   < > 8.1* 7.9* 7.7* 7.6* 7.7*  MG 1.9  --   --   --   --   --  1.3*   < > = values in this interval not displayed.   GFR: Estimated Creatinine Clearance: 94.3 mL/min (by C-G formula based on SCr of 0.86 mg/dL). Liver Function Tests: No results for input(s): AST, ALT, ALKPHOS, BILITOT, PROT, ALBUMIN in the last 168 hours. No results for input(s): LIPASE, AMYLASE in the last 168 hours. No results for input(s): AMMONIA in the last 168 hours. Coagulation Profile: No results for input(s): INR, PROTIME in the last 168 hours. Cardiac Enzymes: No results for input(s): CKTOTAL, CKMB, CKMBINDEX, TROPONINI in the last 168 hours. BNP (last 3 results) No results for input(s): PROBNP in the last 8760 hours. HbA1C: No results for input(s): HGBA1C in the last 72 hours. CBG: Recent Labs  Lab 04/16/19 1152 04/16/19 1538 04/16/19 1932 04/17/19 0013 04/17/19 1208  GLUCAP 108* 147* 117* 106* 110*   Lipid Profile: No results for input(s): CHOL, HDL, LDLCALC, TRIG, CHOLHDL, LDLDIRECT in the last 72 hours. Thyroid Function Tests: No results for input(s): TSH, T4TOTAL, FREET4, T3FREE, THYROIDAB in the last 72 hours. Anemia Panel: No results for input(s): VITAMINB12, FOLATE, FERRITIN, TIBC, IRON, RETICCTPCT in the last 72 hours. Sepsis Labs: No results for input(s): PROCALCITON, LATICACIDVEN in the last 168 hours.  No results found for this or any previous visit (from the past 240 hour(s)).       Radiology Studies: No results found.      Scheduled Meds: . sodium chloride   Intravenous Once  . Chlorhexidine Gluconate Cloth  6 each Topical Daily  . feeding supplement (ENSURE ENLIVE)  237 mL Oral BID BM  . feeding supplement (PRO-STAT SUGAR FREE 64)  30  mL Oral Daily  . Gerhardt's butt cream   Topical TID  . hydrocortisone  25 mg Rectal QHS  . mouth rinse  15 mL Mouth Rinse BID  . metoprolol tartrate  12.5 mg Oral BID  . multivitamin with minerals  1 tablet Oral Daily  . pantoprazole  40 mg Oral BID  . sodium chloride flush  10-40 mL Intracatheter Q12H   Continuous Infusions: . sodium chloride 250 mL (04/16/19 2156)  . magnesium sulfate bolus IVPB 2 g (04/19/19 1120)     LOS: 20 days    Time spent: 34 minutes spent on chart review, discussion with nursing staff, consultants, updating family and interview/physical exam; more than 50% of that time was spent in counseling and/or coordination of care.    Perlie Stene J [place] DO [facility] Pager [phone]  If 7PM-7AM, please contact night-coverage [email or web address] Password [email or web address] 04/19/2019, 11:38 AM

## 2019-04-19 NOTE — Progress Notes (Signed)
Pt was able to tolerate sitting up in the chair for an hour via the maximove lift.   [place] Eric, DO was paged regarding the pt's c/o a sore throat and request for a PRN med.

## 2019-04-19 NOTE — Progress Notes (Signed)
[place] Eric, DO was paged regarding the pt's request for indigestion med.

## 2019-04-20 ENCOUNTER — Inpatient Hospital Stay (HOSPITAL_COMMUNITY): Payer: Self-pay

## 2019-04-20 LAB — BASIC METABOLIC PANEL
Anion gap: 7 (ref 5–15)
BUN: 18 mg/dL (ref 6–20)
CO2: 19 mmol/L — ABNORMAL LOW (ref 22–32)
Calcium: 7.9 mg/dL — ABNORMAL LOW (ref 8.9–10.3)
Chloride: 110 mmol/L (ref 98–111)
Creatinine, Ser: 0.84 mg/dL (ref 0.61–1.24)
GFR calc Af Amer: >60 mL/min (ref >60)
GFR calc non Af Amer: >60 mL/min (ref >60)
Glucose, Bld: 92 mg/dL (ref 70–99)
Potassium: 4.3 mmol/L (ref 3.5–5.1)
Sodium: 136 mmol/L (ref 135–145)

## 2019-04-20 LAB — CBC
HCT: 23.3 % — ABNORMAL LOW (ref 39.0–52.0)
Hemoglobin: 7.4 g/dL — ABNORMAL LOW (ref 13.0–17.0)
MCH: 37 pg — ABNORMAL HIGH (ref 26.0–34.0)
MCHC: 31.8 g/dL (ref 30.0–36.0)
MCV: 116.5 fL — ABNORMAL HIGH (ref 80.0–100.0)
Platelets: DECREASED 10*3/uL (ref 150–400)
RBC: 2 MIL/uL — ABNORMAL LOW (ref 4.22–5.81)
RDW: 17 % — ABNORMAL HIGH (ref 11.5–15.5)
WBC: 7.9 10*3/uL (ref 4.0–10.5)
nRBC: 0 % (ref 0.0–0.2)

## 2019-04-20 LAB — MAGNESIUM: Magnesium: 1.9 mg/dL (ref 1.7–2.4)

## 2019-04-20 LAB — HEMOGLOBIN AND HEMATOCRIT, BLOOD
HCT: 26.7 % — ABNORMAL LOW (ref 39.0–52.0)
Hemoglobin: 8.3 g/dL — ABNORMAL LOW (ref 13.0–17.0)

## 2019-04-20 NOTE — Progress Notes (Signed)
Called report to Hannah,RN on [place] paged to discuss MRI results and orders for MRCP and diet status at 1300. No return call at this time. Passed on to [facility].

## 2019-04-20 NOTE — Progress Notes (Signed)
PROGRESS NOTE    Richard Calhoun  [id number] DOB: May 13, 1961 DOA: 03/30/2019 PCP: Charlott Rakes, MD    Brief Narrative:   58 year old male who presented with hematemesis and abdominal pain. He does have significant past medical history for chronic alcohol abuse, chronic tobacco abuse, coronary artery disease status post angioplasty,chronic heart failure, dyslipidemia, peripheral vascular disease and paroxysmal tachycardia. He reported malaise and hematemesis.He had poor oral intake for last 3 days prior to hospitalization. He had worsening left flank pain, and unwitnessed fall, nausea and vomiting for several days. On his initial physical examination his blood pressure was 109/52, heart rate 78, oxygen saturation 96%, rotation, heart S1-S2 present rhythmic, his abdomen was soft, left-sided hematoma,no lower extremity edema.  Sodium 139, potassium 4.9, chloride 99, bicarb 13, glucose 86, BUN 72, creatinine 3.6, AST 183, ALT 73, total bilirubins 2.6,white count 8.9, hemoglobin 8.5, hematocrit 26.0, platelets 39.SARS COVID-19 was negative. CT abdomen with and no acute changes.  Patient was admitted to the hospital with a working diagnosis of hematemesis due to upper GI bleed.  Patient was medically treated with IV Ativan, IV thiamine, Librium and IV octreotide/IV pantoprazole. He developed worsening mentation, failed noninvasive mechanical ventilation and was intubated September 13.  On September 16 patient underwent upper endoscopy showing esophageal varices and portal hypertensive gastropathy. His mentation improved and he was liberated from mechanical ventilation on September 18.  After liberation from mechanical ventilation patient continued to be encephalopathic, showing withdrawal symptoms, he required dexmedetomidine infusion along with benzodiazepines per protocol. He developed new onset atrial fibrillation with rapid ventricular response, his electrolytes have been  abnormal,and enteral nutrition was started by tube feeds on September 21  Assessment & Plan:   Principal Problem:   Acute hyperactive alcohol withdrawal delirium ([place]) Active Problems:   Anxiety state   EtOH dependence ([place])   Hypertensive heart disease without heart failure   S/P drug eluting coronary stent placement   Upper GI bleed   Acute renal failure (HCC)   Alcoholic hepatitis without ascites   Encephalopathy, hepatic (HCC)   Encounter for central line placement   Acute hypernatremia   Metabolic acidosis   Thrombocytopenia (HCC)   Left arm swelling   Hypomagnesemia   Chronic diastolic CHF (congestive heart failure) (HCC)   Macrocytic anemia   Atrial fibrillation, rapid ([place])   Left lower lobe aspiration pneumonitis  Acute hypoxic respiratory failure. Continue to be more alert and responsive.  His diet has been advanced with good toleration.  --Currently oxygenating well on room air --Continue with aspiration precautions.  Hypoactive delirium, likely of critical illness Hepatic encephalopathy Continues to be more awake and alert, continues to follow commands, with no agitation. Weak voice with difficult communications.  --Enteral nutrition now has been transitioned to dysphagia 3 diet with thin liquids by speech therapy --Continue to monitor mental status closely --Aspiration precautions as above  Atrial fibrillation with RVR/ 2018 preserved LV systolic function. --Continue rate control with metoprolol 12.5mg  PO BID --Not candidate for anticoagulation due to fall risk and upper GI bleed  Alcoholic hepatitis, in the setting of hepatic steatosis/ cirrhosis, due to alcohol abuse. No signs of persistent hepatic encephalopathy. --Continue to hold on lactulose.  --Continue to encourage increased oral intake --Avoid hepatotoxins   Upper GI bleed with acute blood loss anemia EGD 9/16 grade 1 varices distal esophagus, severe diffuse portal hypertensive  gastropathy, small nonbleeding erosions gastric antrum without stigmata of recent bleeding, small dispersed erosions without bleeding duodenal bulb.  H. pylori IgG  antibodies within normal limits. --Hgb 8.0-->7.4 this am; repeat at 12pm --s/p transfusion 1u pRBC on 9/21 and 9/30, s/p 1u FFP on 9/15 --Protonix 40 mg p.o. twice daily --Monitor CBC daily --If hemoglobin continues to trend down, may need GI reevaluation for repeat EGD  Pancreatic lesion Incidental finding of a 4.8 by 3.3 cm lesion with heterogeneous internal attenuation in the tail of the pancreas on the CT abdomen/pelvis on 03/30/2019.  MRCP on 04/20/2019 3.3 x 1.8 cm cystic lesion within the tail of the pancreas either representing a pseudocyst versus cystic neoplasm. --Will need repeat imaging of the pancreas with MRI/MRCP outpatient  AKI with metabolic acidosis/ hypokalemia and hypomagnesemianew hypernatremia.,  --Na 137 this am --Discontinue D5W today --encouraged increase oral intake --BMP daily  Thrombocytopenia: stable with no signs of bleeding, monitor daily  Chronic diastolic heart failure/ coronary artery disease(2018 preserved LV systolic function). Edema likely secondary to low oncotic pressure secondary to hypoproteinemia. --Continue metoprolol for rate control  T2DM. Fasting glucose is 121 today --continue to hold on insulin therapy for now, poor oral intake and risk of hypoglycemia.  Calorie protein malnutrition, Unable to determine severity --nutrition following, continue nutritional supplements.    DVT prophylaxis: SCDs Code Status: Full code Family Communication: Patient and significant other, [name] updated by telephone Disposition Plan: Continue inpatient, PT recommends SNF, social work for coordination   Consultants:   Gastroenterology: Signed off 04/10/2019  PCCM  Procedures:   EGD 04/04/2019  Antimicrobials:  none   Subjective: Patient seen and examined at bedside,  resting comfortably.  Waiting for MRI this morning.  Continues with very weak voice with difficulty communicating.  Also continues with profuse fatigue/weakness.  No other specific complaints this morning.  Denies headache, no fever/chills/night sweats, no chest pain, no palpitations, no abdominal pain.  No acute events overnight per nursing staff.  Objective: Vitals:   04/19/19 0943 04/19/19 1332 04/19/19 1957 04/20/19 0524  BP: (!) 111/53 103/62 116/60 (!) 108/58  Pulse: 89 80 88 78  Resp: 14 20 18 17   Temp: 98.3 F (36.8 C) 98.7 F (37.1 C) 98.4 F (36.9 C)   TempSrc: Oral Oral Oral   SpO2: 96% 95% 97% 97%  Weight:      Height:        Intake/Output Summary (Last 24 hours) at 04/20/2019 1251 Last data filed at 04/20/2019 1030 Gross per 24 hour  Intake 680 ml  Output 1000 ml  Net -320 ml   Filed Weights   04/14/19 1816 04/15/19 0414 04/16/19 0357  Weight: 85.6 kg 86.7 kg 85.6 kg    Examination:  General exam: Appears calm and comfortable, difficulty understanding secondary to his voice Respiratory system: Coarse breath sounds bilaterally, slightly decreased bilateral bases, no wheezing/crackles, normal respiratory effort, on room air. Cardiovascular system: S1 & S2 heard, RRR. No JVD, murmurs, rubs, gallops or clicks. No pedal edema. Gastrointestinal system: Abdomen is nondistended, soft and nontender. No organomegaly or masses felt. Normal bowel sounds heard. Central nervous system: Alert and oriented. No focal neurological deficits. Extremities: Symmetric 5 x 5 power. Skin: No rashes, lesions or ulcers Psychiatry: Judgement and insight appear normal. Mood & affect appropriate.     Data Reviewed: I have personally reviewed following labs and imaging studies  CBC: Recent Labs  Lab 04/16/19 0359 04/18/19 0330 04/19/19 0308 04/20/19 0336  WBC 10.8* 8.2 9.1 7.9  NEUTROABS 9.8* 7.1  --   --   HGB 7.0* 6.9* 8.0* 7.4*  HCT 23.2* 21.9* 24.9* 23.3*  MCV 119.0* 118.4*  115.8* 116.5*  PLT PLATELET CLUMPS NOTED ON SMEAR, COUNT APPEARS DECREASED PLATELET CLUMPS NOTED ON SMEAR, COUNT APPEARS DECREASED PLATELET CLUMPS NOTED ON SMEAR, UNABLE TO ESTIMATE PLATELET CLUMPS NOTED ON SMEAR, COUNT APPEARS DECREASED   Basic Metabolic Panel: Recent Labs  Lab 04/16/19 0359 04/17/19 0415 04/18/19 0330 04/19/19 0308 04/20/19 0336  NA 149* 146* 141 137 136  K 3.6 3.2* 3.8 3.5 4.3  CL 118* 115* 114* 110 110  CO2 22 23 21* 20* 19*  GLUCOSE 244* 111* 105* 121* 92  BUN 32* 30* 26* 19 18  CREATININE 1.09 1.08 1.05 0.86 0.84  CALCIUM 7.9* 7.7* 7.6* 7.7* 7.9*  MG  --   --   --  1.3* 1.9   GFR: Estimated Creatinine Clearance: 96.6 mL/min (by C-G formula based on SCr of 0.84 mg/dL). Liver Function Tests: No results for input(s): AST, ALT, ALKPHOS, BILITOT, PROT, ALBUMIN in the last 168 hours. No results for input(s): LIPASE, AMYLASE in the last 168 hours. No results for input(s): AMMONIA in the last 168 hours. Coagulation Profile: No results for input(s): INR, PROTIME in the last 168 hours. Cardiac Enzymes: No results for input(s): CKTOTAL, CKMB, CKMBINDEX, TROPONINI in the last 168 hours. BNP (last 3 results) No results for input(s): PROBNP in the last 8760 hours. HbA1C: No results for input(s): HGBA1C in the last 72 hours. CBG: Recent Labs  Lab 04/16/19 1152 04/16/19 1538 04/16/19 1932 04/17/19 0013 04/17/19 1208  GLUCAP 108* 147* 117* 106* 110*   Lipid Profile: No results for input(s): CHOL, HDL, LDLCALC, TRIG, CHOLHDL, LDLDIRECT in the last 72 hours. Thyroid Function Tests: No results for input(s): TSH, T4TOTAL, FREET4, T3FREE, THYROIDAB in the last 72 hours. Anemia Panel: No results for input(s): VITAMINB12, FOLATE, FERRITIN, TIBC, IRON, RETICCTPCT in the last 72 hours. Sepsis Labs: No results for input(s): PROCALCITON, LATICACIDVEN in the last 168 hours.  No results found for this or any previous visit (from the past 240 hour(s)).        Radiology Studies: Mr Abdomen Mrcp Wo Contrast  Result Date: 04/20/2019 CLINICAL DATA:  Evaluate pancreatic lesion. EXAM: MRI ABDOMEN WITHOUT CONTRAST  (INCLUDING MRCP) TECHNIQUE: Multiplanar multisequence MR imaging of the abdomen was performed. Heavily T2-weighted images of the biliary and pancreatic ducts were obtained, and three-dimensional MRCP images were rendered by post processing. COMPARISON:  03/30/2019 FINDINGS: Diminished exam detail. At the request of the patient study was terminated prematurely. MRI staff reports patient very sick and unable to breath hold. Patient was unable to talk due to recent intubation. Lower chest: Atelectasis again noted within the lung bases. Hepatobiliary: There is enlargement of the lateral segment and caudate lobe of liver. Contour the liver is slightly irregular which may reflect early cirrhosis. Within the limitations of unenhanced technique there is no focal liver abnormality identified. No intrahepatic bile duct dilatation. Small stones within the neck of gallbladder measure up to 3 mm. No common bile duct dilatation Pancreas: T2 hyperintense lesion within tail of pancreas corresponding to the CT abnormality measures 3.3 x 1.8 by 2.4 cm. Internal area scratch set at least 1 internal area of septation is associated with this structure. No main duct dilatation identified. Spleen:  Enlarged measuring 16.5 cm in length. Adrenals/Urinary Tract: Normal adrenal glands. Within the limitations of unenhanced technique no kidney mass noted. Bilateral hydronephrosis identified, new from previous exam. Stomach/Bowel: Grossly unremarkable within the limitations of diffuse motion artifact. Vascular/Lymphatic: No aneurysm identified. Multiple prominent retroperitoneal lymph nodes are again noted.  Previous aortocaval lymph node measures 1 cm on today's exam. Previously 1.3 cm. Other: There is diffuse edema and new ascites scratch set diffuse edema within the peritoneal  cavity is noted with new moderate volume of ascites within the abdomen and pelvis. Musculoskeletal: No suspicious bone lesions identified. IMPRESSION: 1. Significantly diminished exam detail secondary to patient's current clinical condition. Patient terminated the exam prematurely. There is extensive motion artifact as the patient was unable to breath hold. Examination should be repeated after resolution of the patient's current clinical condition tolerates. Ideally this should be done as an outpatient. 2. Exam confirms presence of cystic lesion within tail of pancreas which measures 3.3 x 1.8 cm and contains at least 1 internal area of septation. This may represent either a pseudo cyst (if there is a history of pancreatitis) versus cystic neoplasm of the pancreas (either benign or malignant.) Further investigation with repeat exam without and with contrast material after patient's clinical condition resolves, ideally as an outpatient. 3. Morphologic features of the liver are suggestive of early cirrhosis. 4. Splenomegaly and ascites. 5. Bilateral hydronephrosis.  Etiology indeterminate. Electronically Signed   By: Kerby Moors M.D.   On: 04/20/2019 12:41   Mr 3d Recon At Scanner  Result Date: 04/20/2019 CLINICAL DATA:  Evaluate pancreatic lesion. EXAM: MRI ABDOMEN WITHOUT CONTRAST  (INCLUDING MRCP) TECHNIQUE: Multiplanar multisequence MR imaging of the abdomen was performed. Heavily T2-weighted images of the biliary and pancreatic ducts were obtained, and three-dimensional MRCP images were rendered by post processing. COMPARISON:  03/30/2019 FINDINGS: Diminished exam detail. At the request of the patient study was terminated prematurely. MRI staff reports patient very sick and unable to breath hold. Patient was unable to talk due to recent intubation. Lower chest: Atelectasis again noted within the lung bases. Hepatobiliary: There is enlargement of the lateral segment and caudate lobe of liver. Contour the  liver is slightly irregular which may reflect early cirrhosis. Within the limitations of unenhanced technique there is no focal liver abnormality identified. No intrahepatic bile duct dilatation. Small stones within the neck of gallbladder measure up to 3 mm. No common bile duct dilatation Pancreas: T2 hyperintense lesion within tail of pancreas corresponding to the CT abnormality measures 3.3 x 1.8 by 2.4 cm. Internal area scratch set at least 1 internal area of septation is associated with this structure. No main duct dilatation identified. Spleen:  Enlarged measuring 16.5 cm in length. Adrenals/Urinary Tract: Normal adrenal glands. Within the limitations of unenhanced technique no kidney mass noted. Bilateral hydronephrosis identified, new from previous exam. Stomach/Bowel: Grossly unremarkable within the limitations of diffuse motion artifact. Vascular/Lymphatic: No aneurysm identified. Multiple prominent retroperitoneal lymph nodes are again noted. Previous aortocaval lymph node measures 1 cm on today's exam. Previously 1.3 cm. Other: There is diffuse edema and new ascites scratch set diffuse edema within the peritoneal cavity is noted with new moderate volume of ascites within the abdomen and pelvis. Musculoskeletal: No suspicious bone lesions identified. IMPRESSION: 1. Significantly diminished exam detail secondary to patient's current clinical condition. Patient terminated the exam prematurely. There is extensive motion artifact as the patient was unable to breath hold. Examination should be repeated after resolution of the patient's current clinical condition tolerates. Ideally this should be done as an outpatient. 2. Exam confirms presence of cystic lesion within tail of pancreas which measures 3.3 x 1.8 cm and contains at least 1 internal area of septation. This may represent either a pseudo cyst (if there is a history of pancreatitis) versus cystic neoplasm  of the pancreas (either benign or malignant.)  Further investigation with repeat exam without and with contrast material after patient's clinical condition resolves, ideally as an outpatient. 3. Morphologic features of the liver are suggestive of early cirrhosis. 4. Splenomegaly and ascites. 5. Bilateral hydronephrosis.  Etiology indeterminate. Electronically Signed   By: Kerby Moors M.D.   On: 04/20/2019 12:41        Scheduled Meds:  sodium chloride   Intravenous Once   Chlorhexidine Gluconate Cloth  6 each Topical Daily   feeding supplement (ENSURE ENLIVE)  237 mL Oral BID BM   feeding supplement (PRO-STAT SUGAR FREE 64)  30 mL Oral Daily   Gerhardt's butt cream   Topical TID   hydrocortisone  25 mg Rectal QHS   mouth rinse  15 mL Mouth Rinse BID   metoprolol tartrate  12.5 mg Oral BID   multivitamin with minerals  1 tablet Oral Daily   pantoprazole  40 mg Oral BID   sodium chloride flush  10-40 mL Intracatheter Q12H   Continuous Infusions:  sodium chloride 250 mL (04/19/19 1224)     LOS: 21 days    Time spent: 34 minutes spent on chart review, discussion with nursing staff, consultants, updating family and interview/physical exam; more than 50% of that time was spent in counseling and/or coordination of care.    Calum Cormier J [place] DO [facility] Pager [phone]  If 7PM-7AM, please contact night-coverage [email or web address] Password [email or web address] 04/20/2019, 12:51 PM

## 2019-04-20 NOTE — Progress Notes (Signed)
PT Cancellation Note  Patient Details Name: Richard Calhoun MRN: [id number] DOB: Apr 22, 1961   Cancelled Treatment:     MRI then transfer to [place] has been evaluated with rec for SNF.   Rica Koyanagi  PTA Acute  Rehabilitation Services Pager      [phone] Office      [phone]

## 2019-04-20 NOTE — Plan of Care (Signed)

## 2019-04-20 NOTE — Progress Notes (Signed)
SLP Cancellation Note  Patient Details Name: Richard Calhoun MRN: [id number] DOB: [birth year]   Cancelled treatment:       Reason Eval/Treat Not Completed: (pt npo for testing, will continue efforts)   Macario Golds 04/20/2019, 11:35 AM  Luanna Salk, MS [facility] SLP Acute Rehab Services Pager [phone] Office [phone]

## 2019-04-20 NOTE — Progress Notes (Signed)
SLP Cancellation Note  Patient Details Name: Richard Calhoun MRN: [id number] DOB: 08-Jan-1961   Cancelled treatment:       Reason Eval/Treat Not Completed: Other (comment)(SLP will defer follow up until assured pt may have po after his testing today, thanks)   Macario Golds 04/20/2019, 4:47 PM Luanna Salk, [place] [facility] SLP [place] Pager [phone] Office [phone]

## 2019-04-20 NOTE — TOC Initial Note (Signed)
Transition of Care [facility]) - Initial/Assessment Note    Patient Details  Name: Richard Calhoun MRN: [id number] Date of Birth: 1961/03/26  Transition of [facility]) CM/SW Contact:    Lia Hopping, [place] Phone Number: 04/20/2019, 4:53 PM  Clinical Narrative:                 During conversation with the patient, at times it was difficult to understand the patient due to weak voice. CSW met with the patient to do discuss disposition plan-SNF. Patient at this time has requested to return home with his significant other. Patient "I'm tired of being in this hospital." Patient does not want to go to "another place" but home. Patient reports "my wife can care for me at home." Patient reports, "I want to get up with physical therapy, I have been waiting to see the therapist. I want to get out this bed."CSW listend attentively to the patient and offered emotional support.  CSW notified the nurse of the patient eagerness to see therapy.  CSW asked patient permission to contact significant other. CSW reached out to the significant other [name]. [name] reports she cannot care for the patient at home because she works during the day and does not get home until 5:00pm. She states the patient does not have other family members that can stay with him 24/7 and provide. She reports the patient will have to be able to walk and care for himself before he can return home alone. She reports the patient has not been employed since 2013 and has never applied for medicaid. She reports the [place] counselor Arvil Persons reached out to her and will complete a application. She is hopeful to see patient improve to where he can go home.   CSW explain to her without insurance it will be difficult to place the patient into rehab facility. She reports understanding.   TOC staff will continue to follow this patient and discuss disposition options.     Expected Discharge Plan: Skilled Nursing Facility Barriers to  Discharge: Continued Medical Work up, Inadequate or no insurance   Patient Goals and CMS Choice Patient states their goals for this hospitalization and ongoing recovery are:: "I want to go."      Expected Discharge Plan and Services Expected Discharge Plan: [place] In-house Referral: Clinical Social Work Discharge Planning Services: CM Consult Post Acute Care Choice: [place] arrangements for the past 2 months: Single Family Home Expected Discharge Date: (unknown)                                    Prior Living Arrangements/Services Living arrangements for the past 2 months: Single Family Home Lives with:: Significant Other Patient language and need for interpreter reviewed:: No        Need for Family Participation in Patient Care: Yes (Comment) Care giver support system in place?: Yes (comment)   Criminal Activity/Legal Involvement Pertinent to Current Situation/Hospitalization: No - Comment as needed  Activities of Daily Living Home Assistive Devices/Equipment: None ADL Screening (condition at time of admission) Patient's cognitive ability adequate to safely complete daily activities?: Yes Is the patient deaf or have difficulty hearing?: No Does the patient have difficulty seeing, even when wearing glasses/contacts?: No Does the patient have difficulty concentrating, remembering, or making decisions?: Yes Patient able to express need for assistance with ADLs?: Yes Does the patient have difficulty  dressing or bathing?: No Independently performs ADLs?: Yes (appropriate for developmental age) Does the patient have difficulty walking or climbing stairs?: Yes Weakness of Legs: Both Weakness of Arms/Hands: None  Permission Sought/Granted   Permission granted to share information with : Yes, Verbal Permission Granted  Share Information with NAME: [name]     Permission granted to share info w Relationship: Significant Other      Emotional Assessment Appearance:: Appears stated age   Affect (typically observed): Accepting, Calm Orientation: : Oriented to Self, Oriented to Place, Oriented to Situation Alcohol / Substance Use: Alcohol Use Psych Involvement: No (comment)  Admission diagnosis:  Flank pain [R10.9] Acute renal failure, unspecified acute renal failure type [facility]) [N17.9] Patient Active Problem List   Diagnosis Date Noted  . Acute hypoactive delirium due to another medical condition 04/10/2019  . Atrial fibrillation, rapid ([place]) 04/09/2019  . Acute hypernatremia 04/07/2019  . Metabolic acidosis 25/85/2778  . Thrombocytopenia ([place]) 04/07/2019  . Left arm swelling 04/07/2019  . Hypomagnesemia 04/07/2019  . Chronic diastolic CHF (congestive heart failure) ([place]) 04/07/2019  . Macrocytic anemia 04/07/2019  . Encounter for central line placement   . Flank pain   . Tachypnea   . Airway intubation performed without difficulty   . Encephalopathy, hepatic ([place])   . Acute renal failure ([place])   . Alcoholic hepatitis without ascites   . Upper GI bleed 03/30/2019  . Decreased pulses in feet 11/16/2018  . Pain in both lower extremities 11/16/2018  . Localized swelling of both lower legs 11/16/2018  . S/P drug eluting coronary stent placement 07/20/2017  . Tinnitus of left ear 07/08/2017  . Hand edema 04/19/2017  . Hypophosphatemia 04/19/2017  . Coronary artery disease involving native coronary artery of native heart without angina pectoris   . Acute hyperactive alcohol withdrawal delirium ([place])   . Chest pain on exertion 04/15/2017  . Chest pain 04/14/2017  . Hypokalemia 04/14/2017  . Tobacco abuse 04/14/2017  . Hypertensive heart disease without heart failure 03/25/2017  . Tachycardia 03/25/2017  . Infected sebaceous cyst of skin 10/23/2012  . Diarrhea 08/29/2012  . Impaired glucose tolerance 05/18/2011  . Preventative health care 05/16/2011  . BICEPS TENDON RUPTURE, RIGHT 06/01/2010  . Anxiety  state 10/30/2009  . ANGIOEDEMA 04/24/2009  . Hyperlipidemia LDL goal <70 02/21/2007  . EtOH dependence ([place]) 02/21/2007  . DEPRESSION 02/21/2007  . Uncontrolled hypertension 02/21/2007  . HEMORRHOIDS 02/21/2007  . ALLERGIC RHINITIS 02/21/2007  . GERD 02/21/2007  . OSTEOARTHRITIS 02/21/2007  . LOW BACK PAIN 02/21/2007  . Headache(784.0) 02/21/2007  . COLONIC POLYPS, HX OF 02/21/2007   PCP:  Charlott Rakes, MD Pharmacy:   [place], [address] [place] [address] Phone: [phone] Fax: [phone]     Social Determinants of Health (SDOH) Interventions    Readmission Risk Interventions Readmission Risk Prevention Plan 04/04/2019  Transportation Screening Complete  PCP or Specialist Appt within 3-5 Days Not Complete  Not Complete comments Not ready for dc  HRI or [place] Not Complete  HRI or Home Care Consult comments NA  Social Work Consult for [place] Planning/Counseling Not Complete  SW consult not completed comments Pt confused  Palliative Care Screening Not Applicable  Medication Review [employer] photographer) Complete  Some recent data might be hidden

## 2019-04-21 LAB — BASIC METABOLIC PANEL
Anion gap: 7 (ref 5–15)
BUN: 19 mg/dL (ref 6–20)
CO2: 20 mmol/L — ABNORMAL LOW (ref 22–32)
Calcium: 8 mg/dL — ABNORMAL LOW (ref 8.9–10.3)
Chloride: 109 mmol/L (ref 98–111)
Creatinine, Ser: 0.84 mg/dL (ref 0.61–1.24)
GFR calc Af Amer: >60 mL/min (ref >60)
GFR calc non Af Amer: >60 mL/min (ref >60)
Glucose, Bld: 118 mg/dL — ABNORMAL HIGH (ref 70–99)
Potassium: 4.2 mmol/L (ref 3.5–5.1)
Sodium: 136 mmol/L (ref 135–145)

## 2019-04-21 LAB — CBC
HCT: 24.2 % — ABNORMAL LOW (ref 39.0–52.0)
Hemoglobin: 7.6 g/dL — ABNORMAL LOW (ref 13.0–17.0)
MCH: 36.2 pg — ABNORMAL HIGH (ref 26.0–34.0)
MCHC: 31.4 g/dL (ref 30.0–36.0)
MCV: 115.2 fL — ABNORMAL HIGH (ref 80.0–100.0)
Platelets: ADEQUATE 10*3/uL (ref 150–400)
RBC: 2.1 MIL/uL — ABNORMAL LOW (ref 4.22–5.81)
RDW: 16.7 % — ABNORMAL HIGH (ref 11.5–15.5)
WBC: 7 10*3/uL (ref 4.0–10.5)
nRBC: 0 % (ref 0.0–0.2)

## 2019-04-21 LAB — MAGNESIUM: Magnesium: 1.6 mg/dL — ABNORMAL LOW (ref 1.7–2.4)

## 2019-04-21 MED ORDER — LOPERAMIDE HCL 2 MG PO CAPS
2.0000 mg | ORAL_CAPSULE | ORAL | Status: DC | PRN
  Administered 2019-04-21 – 2019-04-24 (×3): 2 mg via ORAL
  Filled 2019-04-21 (×3): qty 1

## 2019-04-21 NOTE — Progress Notes (Signed)
PROGRESS NOTE    Richard Calhoun  [id number] DOB: May 10, 1961 DOA: 03/30/2019 PCP: Charlott Rakes, MD    Brief Narrative:   58 year old male who presented with hematemesis and abdominal pain. He does have significant past medical history for chronic alcohol abuse, chronic tobacco abuse, coronary artery disease status post angioplasty,chronic heart failure, dyslipidemia, peripheral vascular disease and paroxysmal tachycardia. He reported malaise and hematemesis.He had poor oral intake for last 3 days prior to hospitalization. He had worsening left flank pain, and unwitnessed fall, nausea and vomiting for several days. On his initial physical examination his blood pressure was 109/52, heart rate 78, oxygen saturation 96%, rotation, heart S1-S2 present rhythmic, his abdomen was soft, left-sided hematoma,no lower extremity edema.  Sodium 139, potassium 4.9, chloride 99, bicarb 13, glucose 86, BUN 72, creatinine 3.6, AST 183, ALT 73, total bilirubins 2.6,white count 8.9, hemoglobin 8.5, hematocrit 26.0, platelets 39.SARS COVID-19 was negative. CT abdomen with and no acute changes.  Patient was admitted to the hospital with a working diagnosis of hematemesis due to upper GI bleed.  Patient was medically treated with IV Ativan, IV thiamine, Librium and IV octreotide/IV pantoprazole. He developed worsening mentation, failed noninvasive mechanical ventilation and was intubated September 13.  On September 16 patient underwent upper endoscopy showing esophageal varices and portal hypertensive gastropathy. His mentation improved and he was liberated from mechanical ventilation on September 18.  After liberation from mechanical ventilation patient continued to be encephalopathic, showing withdrawal symptoms, he required dexmedetomidine infusion along with benzodiazepines per protocol. He developed new onset atrial fibrillation with rapid ventricular response, his electrolytes have been  abnormal,and enteral nutrition was started by tube feeds on September 21  Assessment & Plan:   Principal Problem:   Acute hyperactive alcohol withdrawal delirium ([place]) Active Problems:   Anxiety state   EtOH dependence ([place])   Hypertensive heart disease without heart failure   S/P drug eluting coronary stent placement   Upper GI bleed   Acute renal failure (HCC)   Alcoholic hepatitis without ascites   Encephalopathy, hepatic (HCC)   Encounter for central line placement   Acute hypernatremia   Metabolic acidosis   Thrombocytopenia (HCC)   Left arm swelling   Hypomagnesemia   Chronic diastolic CHF (congestive heart failure) (HCC)   Macrocytic anemia   Atrial fibrillation, rapid ([place])   Left lower lobe aspiration pneumonitis  Acute hypoxic respiratory failure: Resolved His diet has been advanced with good toleration.  --Currently oxygenating well on room air --Continue with aspiration precautions. --Continue speech therapy  Hypoactive delirium, likely of critical illness Hepatic encephalopathy Continues to be more awake and alert, continues to follow commands, with no agitation. Weak voice with difficult communications.  --Enteral nutrition now has been transitioned to dysphagia 3 diet with thin liquids by speech therapy --Continue to monitor mental status closely --Aspiration precautions as above  Atrial fibrillation with RVR/ 2018 preserved LV systolic function. --Continue rate control with metoprolol 12.5mg  PO BID --Not candidate for anticoagulation due to fall risk and upper GI bleed with EV  Alcoholic hepatitis, in the setting of hepatic steatosis/ cirrhosis, due to alcohol abuse. No signs of persistent hepatic encephalopathy. --Continue to hold on lactulose.  --Continue to encourage increased oral intake --Avoid hepatotoxins   Upper GI bleed with acute blood loss anemia Esophageal varices EGD 9/16 grade 1 varices distal esophagus, severe diffuse portal  hypertensive gastropathy, small nonbleeding erosions gastric antrum without stigmata of recent bleeding, small dispersed erosions without bleeding duodenal bulb.  H. pylori IgG  antibodies within normal limits. --Hgb 8.0-->7.4-->7.6; stable --s/p transfusion 1u pRBC on 9/21 and 9/30, s/p 1u FFP on 9/15 --Protonix 40 mg p.o. twice daily --Monitor CBC daily --If hemoglobin continues to trend down, may need GI reevaluation for repeat EGD  Pancreatic lesion Incidental finding of a 4.8 by 3.3 cm lesion with heterogeneous internal attenuation in the tail of the pancreas on the CT abdomen/pelvis on 03/30/2019.  MRCP on 04/20/2019 3.3 x 1.8 cm cystic lesion within the tail of the pancreas either representing a pseudocyst versus cystic neoplasm. --Will need repeat imaging of the pancreas with MRI/MRCP outpatient  AKI with metabolic acidosis/ hypokalemia and hypomagnesemianew hypernatremia.,  --Na 136 this am --encouraged increase oral intake --BMP daily  Thrombocytopenia: stable with no signs of bleeding, monitor daily  Chronic diastolic heart failure/ coronary artery disease(2018 preserved LV systolic function). Edema likely secondary to low oncotic pressure secondary to hypoproteinemia. --Continue metoprolol for rate control  T2DM. Fasting glucose is 118 today --continue to hold on insulin therapy for now, poor oral intake and risk of hypoglycemia.  Calorie protein malnutrition, Unable to determine severity --nutrition following, continue nutritional supplements.   Severe debility/deconditioning With significant debility and deconditioning from prolonged hospitalization and critical illness.  Patient is uninsured and will have a difficulty obtaining SNF placement.  Continues to work with PT/OT with findings of unsteady with standing and pivoting. --Continue PT/OT/ST while inpatient --If unable to obtain SNF placement, patient and significant other need to find alternative means given  that he is unsafe for discharge at this time due to he will be alone for multiple hours a day and he has severe deconditioning. --Social work continues to work on options.   DVT prophylaxis: SCDs Code Status: Full code Family Communication: Patient and significant other, [name] updated by telephone Disposition Plan: Continue inpatient, PT recommends SNF, social work for coordination   Consultants:   Gastroenterology: Signed off 04/10/2019  PCCM  Procedures:   EGD 04/04/2019  Antimicrobials:  none   Subjective: Patient seen and examined at bedside, resting comfortably.  Extremely frustrated and upset, does not understand why he has not progressed as much as he thinks he should.  He even implies "we have a terrible government system" in reference to his lack of insurance.  He continues with severe weakness and, relatively bedbound status.  No other specific complaints this morning.  Denies headache, no fever/chills/night sweats, no chest pain, no palpitations, no abdominal pain.  No acute events overnight per nursing staff.  Objective: Vitals:   04/20/19 1426 04/20/19 2059 04/20/19 2213 04/21/19 0603  BP: 104/61 (!) 108/55 110/61 118/60  Pulse: 90 100 98 86  Resp:  18  18  Temp: (!) 97.5 F (36.4 C) 98.5 F (36.9 C)  98.7 F (37.1 C)  TempSrc: Oral Oral  Oral  SpO2: 96% 99%  98%  Weight:      Height:        Intake/Output Summary (Last 24 hours) at 04/21/2019 1259 Last data filed at 04/21/2019 0917 Gross per 24 hour  Intake 860 ml  Output 2126 ml  Net -1266 ml   Filed Weights   04/14/19 1816 04/15/19 0414 04/16/19 0357  Weight: 85.6 kg 86.7 kg 85.6 kg    Examination:  General exam: Appears calm and comfortable, difficulty understanding secondary to his voice Respiratory system: Coarse breath sounds bilaterally, slightly decreased bilateral bases, no wheezing/crackles, normal respiratory effort, on room air. Cardiovascular system: S1 & S2 heard, RRR. No JVD,  murmurs, rubs, gallops  or clicks. No pedal edema. Gastrointestinal system: Abdomen is nondistended, soft and nontender. No organomegaly or masses felt. Normal bowel sounds heard. Central nervous system: Alert and oriented. No focal neurological deficits. Extremities: Symmetric 5 x 5 power. Skin: No rashes, lesions or ulcers Psychiatry: Judgement and insight appear normal. Mood & affect appropriate.     Data Reviewed: I have personally reviewed following labs and imaging studies  CBC: Recent Labs  Lab 04/16/19 0359 04/18/19 0330 04/19/19 0308 04/20/19 0336 04/20/19 1349 04/21/19 0423  WBC 10.8* 8.2 9.1 7.9  --  7.0  NEUTROABS 9.8* 7.1  --   --   --   --   HGB 7.0* 6.9* 8.0* 7.4* 8.3* 7.6*  HCT 23.2* 21.9* 24.9* 23.3* 26.7* 24.2*  MCV 119.0* 118.4* 115.8* 116.5*  --  115.2*  PLT PLATELET CLUMPS NOTED ON SMEAR, COUNT APPEARS DECREASED PLATELET CLUMPS NOTED ON SMEAR, COUNT APPEARS DECREASED PLATELET CLUMPS NOTED ON SMEAR, UNABLE TO ESTIMATE PLATELET CLUMPS NOTED ON SMEAR, COUNT APPEARS DECREASED  --  PLATELET CLUMPS NOTED ON SMEAR, COUNT APPEARS ADEQUATE   Basic Metabolic Panel: Recent Labs  Lab 04/17/19 0415 04/18/19 0330 04/19/19 0308 04/20/19 0336 04/21/19 0423  NA 146* 141 137 136 136  K 3.2* 3.8 3.5 4.3 4.2  CL 115* 114* 110 110 109  CO2 23 21* 20* 19* 20*  GLUCOSE 111* 105* 121* 92 118*  BUN 30* 26* 19 18 19   CREATININE 1.08 1.05 0.86 0.84 0.84  CALCIUM 7.7* 7.6* 7.7* 7.9* 8.0*  MG  --   --  1.3* 1.9 1.6*   GFR: Estimated Creatinine Clearance: 96.6 mL/min (by C-G formula based on SCr of 0.84 mg/dL). Liver Function Tests: No results for input(s): AST, ALT, ALKPHOS, BILITOT, PROT, ALBUMIN in the last 168 hours. No results for input(s): LIPASE, AMYLASE in the last 168 hours. No results for input(s): AMMONIA in the last 168 hours. Coagulation Profile: No results for input(s): INR, PROTIME in the last 168 hours. Cardiac Enzymes: No results for input(s): CKTOTAL,  CKMB, CKMBINDEX, TROPONINI in the last 168 hours. BNP (last 3 results) No results for input(s): PROBNP in the last 8760 hours. HbA1C: No results for input(s): HGBA1C in the last 72 hours. CBG: Recent Labs  Lab 04/16/19 1152 04/16/19 1538 04/16/19 1932 04/17/19 0013 04/17/19 1208  GLUCAP 108* 147* 117* 106* 110*   Lipid Profile: No results for input(s): CHOL, HDL, LDLCALC, TRIG, CHOLHDL, LDLDIRECT in the last 72 hours. Thyroid Function Tests: No results for input(s): TSH, T4TOTAL, FREET4, T3FREE, THYROIDAB in the last 72 hours. Anemia Panel: No results for input(s): VITAMINB12, FOLATE, FERRITIN, TIBC, IRON, RETICCTPCT in the last 72 hours. Sepsis Labs: No results for input(s): PROCALCITON, LATICACIDVEN in the last 168 hours.  No results found for this or any previous visit (from the past 240 hour(s)).       Radiology Studies: Mr Abdomen Mrcp Wo Contrast  Result Date: 04/20/2019 CLINICAL DATA:  Evaluate pancreatic lesion. EXAM: MRI ABDOMEN WITHOUT CONTRAST  (INCLUDING MRCP) TECHNIQUE: Multiplanar multisequence MR imaging of the abdomen was performed. Heavily T2-weighted images of the biliary and pancreatic ducts were obtained, and three-dimensional MRCP images were rendered by post processing. COMPARISON:  03/30/2019 FINDINGS: Diminished exam detail. At the request of the patient study was terminated prematurely. MRI staff reports patient very sick and unable to breath hold. Patient was unable to talk due to recent intubation. Lower chest: Atelectasis again noted within the lung bases. Hepatobiliary: There is enlargement of the lateral segment and caudate  lobe of liver. Contour the liver is slightly irregular which may reflect early cirrhosis. Within the limitations of unenhanced technique there is no focal liver abnormality identified. No intrahepatic bile duct dilatation. Small stones within the neck of gallbladder measure up to 3 mm. No common bile duct dilatation Pancreas: T2  hyperintense lesion within tail of pancreas corresponding to the CT abnormality measures 3.3 x 1.8 by 2.4 cm. Internal area scratch set at least 1 internal area of septation is associated with this structure. No main duct dilatation identified. Spleen:  Enlarged measuring 16.5 cm in length. Adrenals/Urinary Tract: Normal adrenal glands. Within the limitations of unenhanced technique no kidney mass noted. Bilateral hydronephrosis identified, new from previous exam. Stomach/Bowel: Grossly unremarkable within the limitations of diffuse motion artifact. Vascular/Lymphatic: No aneurysm identified. Multiple prominent retroperitoneal lymph nodes are again noted. Previous aortocaval lymph node measures 1 cm on today's exam. Previously 1.3 cm. Other: There is diffuse edema and new ascites scratch set diffuse edema within the peritoneal cavity is noted with new moderate volume of ascites within the abdomen and pelvis. Musculoskeletal: No suspicious bone lesions identified. IMPRESSION: 1. Significantly diminished exam detail secondary to patient's current clinical condition. Patient terminated the exam prematurely. There is extensive motion artifact as the patient was unable to breath hold. Examination should be repeated after resolution of the patient's current clinical condition tolerates. Ideally this should be done as an outpatient. 2. Exam confirms presence of cystic lesion within tail of pancreas which measures 3.3 x 1.8 cm and contains at least 1 internal area of septation. This may represent either a pseudo cyst (if there is a history of pancreatitis) versus cystic neoplasm of the pancreas (either benign or malignant.) Further investigation with repeat exam without and with contrast material after patient's clinical condition resolves, ideally as an outpatient. 3. Morphologic features of the liver are suggestive of early cirrhosis. 4. Splenomegaly and ascites. 5. Bilateral hydronephrosis.  Etiology indeterminate.  Electronically Signed   By: Kerby Moors M.D.   On: 04/20/2019 12:41   Mr 3d Recon At Scanner  Result Date: 04/20/2019 CLINICAL DATA:  Evaluate pancreatic lesion. EXAM: MRI ABDOMEN WITHOUT CONTRAST  (INCLUDING MRCP) TECHNIQUE: Multiplanar multisequence MR imaging of the abdomen was performed. Heavily T2-weighted images of the biliary and pancreatic ducts were obtained, and three-dimensional MRCP images were rendered by post processing. COMPARISON:  03/30/2019 FINDINGS: Diminished exam detail. At the request of the patient study was terminated prematurely. MRI staff reports patient very sick and unable to breath hold. Patient was unable to talk due to recent intubation. Lower chest: Atelectasis again noted within the lung bases. Hepatobiliary: There is enlargement of the lateral segment and caudate lobe of liver. Contour the liver is slightly irregular which may reflect early cirrhosis. Within the limitations of unenhanced technique there is no focal liver abnormality identified. No intrahepatic bile duct dilatation. Small stones within the neck of gallbladder measure up to 3 mm. No common bile duct dilatation Pancreas: T2 hyperintense lesion within tail of pancreas corresponding to the CT abnormality measures 3.3 x 1.8 by 2.4 cm. Internal area scratch set at least 1 internal area of septation is associated with this structure. No main duct dilatation identified. Spleen:  Enlarged measuring 16.5 cm in length. Adrenals/Urinary Tract: Normal adrenal glands. Within the limitations of unenhanced technique no kidney mass noted. Bilateral hydronephrosis identified, new from previous exam. Stomach/Bowel: Grossly unremarkable within the limitations of diffuse motion artifact. Vascular/Lymphatic: No aneurysm identified. Multiple prominent retroperitoneal lymph nodes are again noted.  Previous aortocaval lymph node measures 1 cm on today's exam. Previously 1.3 cm. Other: There is diffuse edema and new ascites scratch set  diffuse edema within the peritoneal cavity is noted with new moderate volume of ascites within the abdomen and pelvis. Musculoskeletal: No suspicious bone lesions identified. IMPRESSION: 1. Significantly diminished exam detail secondary to patient's current clinical condition. Patient terminated the exam prematurely. There is extensive motion artifact as the patient was unable to breath hold. Examination should be repeated after resolution of the patient's current clinical condition tolerates. Ideally this should be done as an outpatient. 2. Exam confirms presence of cystic lesion within tail of pancreas which measures 3.3 x 1.8 cm and contains at least 1 internal area of septation. This may represent either a pseudo cyst (if there is a history of pancreatitis) versus cystic neoplasm of the pancreas (either benign or malignant.) Further investigation with repeat exam without and with contrast material after patient's clinical condition resolves, ideally as an outpatient. 3. Morphologic features of the liver are suggestive of early cirrhosis. 4. Splenomegaly and ascites. 5. Bilateral hydronephrosis.  Etiology indeterminate. Electronically Signed   By: Kerby Moors M.D.   On: 04/20/2019 12:41        Scheduled Meds:  sodium chloride   Intravenous Once   Chlorhexidine Gluconate Cloth  6 each Topical Daily   feeding supplement (ENSURE ENLIVE)  237 mL Oral BID BM   feeding supplement (PRO-STAT SUGAR FREE 64)  30 mL Oral Daily   Gerhardt's butt cream   Topical TID   hydrocortisone  25 mg Rectal QHS   mouth rinse  15 mL Mouth Rinse BID   metoprolol tartrate  12.5 mg Oral BID   multivitamin with minerals  1 tablet Oral Daily   pantoprazole  40 mg Oral BID   sodium chloride flush  10-40 mL Intracatheter Q12H   Continuous Infusions:  sodium chloride 250 mL (04/19/19 1224)     LOS: 22 days    Time spent: 34 minutes spent on chart review, discussion with nursing staff, consultants,  updating family and interview/physical exam; more than 50% of that time was spent in counseling and/or coordination of care.    Colinda Barth J [place] DO [facility] Pager [phone]  If 7PM-7AM, please contact night-coverage [email or web address] Password [email or web address] 04/21/2019, 12:59 PM

## 2019-04-22 MED ORDER — MICONAZOLE NITRATE POWD
Freq: Two times a day (BID) | Status: DC
  Administered 2019-04-22 – 2019-04-29 (×15): via TOPICAL
  Administered 2019-04-29 – 2019-04-30 (×2): 1 via TOPICAL
  Administered 2019-04-30 – 2019-05-15 (×30): via TOPICAL
  Administered 2019-05-15: 1 via TOPICAL
  Administered 2019-05-16 – 2019-05-25 (×19): via TOPICAL

## 2019-04-22 NOTE — Progress Notes (Signed)
PROGRESS NOTE    Richard Calhoun  [id number] DOB: [birth year] DOA: 03/30/2019 PCP: Charlott Rakes, MD    Brief Narrative:   58 year old male who presented with hematemesis and abdominal pain. He does have significant past medical history for chronic alcohol abuse, chronic tobacco abuse, coronary artery disease status post angioplasty,chronic heart failure, dyslipidemia, peripheral vascular disease and paroxysmal tachycardia. He reported malaise and hematemesis.He had poor oral intake for last 3 days prior to hospitalization. He had worsening left flank pain, and unwitnessed fall, nausea and vomiting for several days. On his initial physical examination his blood pressure was 109/52, heart rate 78, oxygen saturation 96%, rotation, heart S1-S2 present rhythmic, his abdomen was soft, left-sided hematoma,no lower extremity edema.  Sodium 139, potassium 4.9, chloride 99, bicarb 13, glucose 86, BUN 72, creatinine 3.6, AST 183, ALT 73, total bilirubins 2.6,white count 8.9, hemoglobin 8.5, hematocrit 26.0, platelets 39.SARS COVID-19 was negative. CT abdomen with and no acute changes.  Patient was admitted to the hospital with a working diagnosis of hematemesis due to upper GI bleed.  Patient was medically treated with IV Ativan, IV thiamine, Librium and IV octreotide/IV pantoprazole. He developed worsening mentation, failed noninvasive mechanical ventilation and was intubated September 13.  On September 16 patient underwent upper endoscopy showing esophageal varices and portal hypertensive gastropathy. His mentation improved and he was liberated from mechanical ventilation on September 18.  After liberation from mechanical ventilation patient continued to be encephalopathic, showing withdrawal symptoms, he required dexmedetomidine infusion along with benzodiazepines per protocol. He developed new onset atrial fibrillation with rapid ventricular response, his electrolytes have been  abnormal,and enteral nutrition was started by tube feeds on September 21  Assessment & Plan:   Principal Problem:   Acute hyperactive alcohol withdrawal delirium ([place]) Active Problems:   Anxiety state   EtOH dependence ([place])   Hypertensive heart disease without heart failure   S/P drug eluting coronary stent placement   Upper GI bleed   Acute renal failure (HCC)   Alcoholic hepatitis without ascites   Encephalopathy, hepatic (HCC)   Encounter for central line placement   Acute hypernatremia   Metabolic acidosis   Thrombocytopenia (HCC)   Left arm swelling   Hypomagnesemia   Chronic diastolic CHF (congestive heart failure) (HCC)   Macrocytic anemia   Atrial fibrillation, rapid ([place])   Left lower lobe aspiration pneumonitis  Acute hypoxic respiratory failure: Resolved His diet has been advanced with good toleration.  --Currently oxygenating well on room air --Continue with aspiration precautions. --Continue speech therapy  Hypoactive delirium, likely of critical illness Hepatic encephalopathy Continues to be more awake and alert, continues to follow commands, with no agitation. Weak voice with difficult communications.  --Enteral nutrition now has been transitioned to dysphagia 3 diet with thin liquids by speech therapy --Continue to monitor mental status closely --Aspiration precautions as above  Atrial fibrillation with RVR/ 2018 preserved LV systolic function. --Continue rate control with metoprolol 12.5mg  PO BID --Not candidate for anticoagulation due to fall risk and upper GI bleed with EV  Alcoholic hepatitis, in the setting of hepatic steatosis/ cirrhosis, due to alcohol abuse. No signs of persistent hepatic encephalopathy. --Continue to hold on lactulose.  --Continue to encourage increased oral intake --Avoid hepatotoxins   Upper GI bleed with acute blood loss anemia Esophageal varices EGD 9/16 grade 1 varices distal esophagus, severe diffuse portal  hypertensive gastropathy, small nonbleeding erosions gastric antrum without stigmata of recent bleeding, small dispersed erosions without bleeding duodenal bulb.  H. pylori IgG  antibodies within normal limits. --Hgb 8.0-->7.4-->7.6; stable --s/p transfusion 1u pRBC on 9/21 and 9/30, s/p 1u FFP on 9/15 --Protonix 40 mg p.o. twice daily --Monitor CBC daily --If hemoglobin continues to trend down, may need GI reevaluation for repeat EGD  Pancreatic lesion Incidental finding of a 4.8 by 3.3 cm lesion with heterogeneous internal attenuation in the tail of the pancreas on the CT abdomen/pelvis on 03/30/2019.  MRCP on 04/20/2019 3.3 x 1.8 cm cystic lesion within the tail of the pancreas either representing a pseudocyst versus cystic neoplasm. --Will need repeat imaging of the pancreas with MRI/MRCP outpatient  AKI with metabolic acidosis/ hypokalemia and hypomagnesemianew hypernatremia.,  --Na 136 --encouraged increase oral intake --BMP daily  Thrombocytopenia: stable with no signs of bleeding  Chronic diastolic heart failure/ coronary artery disease(2018 preserved LV systolic function). Edema likely secondary to low oncotic pressure secondary to hypoproteinemia. --Continue metoprolol for rate control  T2DM. Fasting glucose is 118 today --continue to hold on insulin therapy for now, poor oral intake and risk of hypoglycemia.  Calorie protein malnutrition, Unable to determine severity --nutrition following, continue nutritional supplements.   Severe debility/deconditioning With significant debility and deconditioning from prolonged hospitalization and critical illness.  Patient is uninsured and will have a difficulty obtaining SNF placement.  Continues to work with PT/OT with findings of unsteady with standing and pivoting. --Continue PT/OT/ST while inpatient --If unable to obtain SNF placement, patient and significant other need to find alternative means given that he is unsafe for  discharge at this time due to he will be alone for multiple hours a day and he has severe deconditioning. --Social work continues to work on options.   DVT prophylaxis: SCDs Code Status: Full code Family Communication: Patient and significant other, [name] updated by telephone Disposition Plan: Continue inpatient, PT recommends SNF, social work for coordination   Consultants:   Gastroenterology: Signed off 04/10/2019  PCCM  Procedures:   EGD 04/04/2019  Antimicrobials:  none   Subjective: Patient seen and examined at bedside, resting comfortably.  Continues to be frustrated and angry regarding lack of progress and not having therapy over the past few days.  Complains of irritation to his groin area and that condom catheter nonfunctional and difficulty with bedside urinal.  Continues with severe weakness debility and relatively bedbound. No other specific complaints this morning.  Denies headache, no fever/chills/night sweats, no chest pain, no palpitations, no abdominal pain.  No acute events overnight per nursing staff.  Objective: Vitals:   04/21/19 1411 04/21/19 2055 04/22/19 0425 04/22/19 1326  BP: 117/63 (!) 112/58 113/64 115/69  Pulse: 90 87 90 86  Resp: 18 18 18 16   Temp: 97.8 F (36.6 C) 98 F (36.7 C) 98 F (36.7 C) 98.1 F (36.7 C)  TempSrc: Oral Oral Oral Oral  SpO2: 99% 100% 97% 99%  Weight:      Height:        Intake/Output Summary (Last 24 hours) at 04/22/2019 1346 Last data filed at 04/22/2019 1327 Gross per 24 hour  Intake 1845 ml  Output 2850 ml  Net -1005 ml   Filed Weights   04/14/19 1816 04/15/19 0414 04/16/19 0357  Weight: 85.6 kg 86.7 kg 85.6 kg    Examination:  General exam: Appears calm and comfortable, difficulty understanding secondary to his voice Respiratory system: Coarse breath sounds bilaterally, slightly decreased bilateral bases, no wheezing/crackles, normal respiratory effort, on room air. Cardiovascular system: S1 & S2 heard,  RRR. No JVD, murmurs, rubs, gallops or clicks. No  pedal edema. Gastrointestinal system: Abdomen is nondistended, soft and nontender. No organomegaly or masses felt. Normal bowel sounds heard. Central nervous system: Alert and oriented. No focal neurological deficits. Extremities: Symmetric 5 x 5 power. Skin: No rashes, lesions or ulcers Psychiatry: Judgement and insight appear normal. Mood & affect appropriate.     Data Reviewed: I have personally reviewed following labs and imaging studies  CBC: Recent Labs  Lab 04/16/19 0359 04/18/19 0330 04/19/19 0308 04/20/19 0336 04/20/19 1349 04/21/19 0423  WBC 10.8* 8.2 9.1 7.9  --  7.0  NEUTROABS 9.8* 7.1  --   --   --   --   HGB 7.0* 6.9* 8.0* 7.4* 8.3* 7.6*  HCT 23.2* 21.9* 24.9* 23.3* 26.7* 24.2*  MCV 119.0* 118.4* 115.8* 116.5*  --  115.2*  PLT PLATELET CLUMPS NOTED ON SMEAR, COUNT APPEARS DECREASED PLATELET CLUMPS NOTED ON SMEAR, COUNT APPEARS DECREASED PLATELET CLUMPS NOTED ON SMEAR, UNABLE TO ESTIMATE PLATELET CLUMPS NOTED ON SMEAR, COUNT APPEARS DECREASED  --  PLATELET CLUMPS NOTED ON SMEAR, COUNT APPEARS ADEQUATE   Basic Metabolic Panel: Recent Labs  Lab 04/17/19 0415 04/18/19 0330 04/19/19 0308 04/20/19 0336 04/21/19 0423  NA 146* 141 137 136 136  K 3.2* 3.8 3.5 4.3 4.2  CL 115* 114* 110 110 109  CO2 23 21* 20* 19* 20*  GLUCOSE 111* 105* 121* 92 118*  BUN 30* 26* 19 18 19   CREATININE 1.08 1.05 0.86 0.84 0.84  CALCIUM 7.7* 7.6* 7.7* 7.9* 8.0*  MG  --   --  1.3* 1.9 1.6*   GFR: Estimated Creatinine Clearance: 96.6 mL/min (by C-G formula based on SCr of 0.84 mg/dL). Liver Function Tests: No results for input(s): AST, ALT, ALKPHOS, BILITOT, PROT, ALBUMIN in the last 168 hours. No results for input(s): LIPASE, AMYLASE in the last 168 hours. No results for input(s): AMMONIA in the last 168 hours. Coagulation Profile: No results for input(s): INR, PROTIME in the last 168 hours. Cardiac Enzymes: No results for input(s):  CKTOTAL, CKMB, CKMBINDEX, TROPONINI in the last 168 hours. BNP (last 3 results) No results for input(s): PROBNP in the last 8760 hours. HbA1C: No results for input(s): HGBA1C in the last 72 hours. CBG: Recent Labs  Lab 04/16/19 1152 04/16/19 1538 04/16/19 1932 04/17/19 0013 04/17/19 1208  GLUCAP 108* 147* 117* 106* 110*   Lipid Profile: No results for input(s): CHOL, HDL, LDLCALC, TRIG, CHOLHDL, LDLDIRECT in the last 72 hours. Thyroid Function Tests: No results for input(s): TSH, T4TOTAL, FREET4, T3FREE, THYROIDAB in the last 72 hours. Anemia Panel: No results for input(s): VITAMINB12, FOLATE, FERRITIN, TIBC, IRON, RETICCTPCT in the last 72 hours. Sepsis Labs: No results for input(s): PROCALCITON, LATICACIDVEN in the last 168 hours.  No results found for this or any previous visit (from the past 240 hour(s)).       Radiology Studies: No results found.      Scheduled Meds: . sodium chloride   Intravenous Once  . Chlorhexidine Gluconate Cloth  6 each Topical Daily  . feeding supplement (ENSURE ENLIVE)  237 mL Oral BID BM  . feeding supplement (PRO-STAT SUGAR FREE 64)  30 mL Oral Daily  . Gerhardt's butt cream   Topical TID  . hydrocortisone  25 mg Rectal QHS  . mouth rinse  15 mL Mouth Rinse BID  . metoprolol tartrate  12.5 mg Oral BID  . miconazole nitrate   Topical BID  . multivitamin with minerals  1 tablet Oral Daily  . pantoprazole  40 mg  Oral BID  . sodium chloride flush  10-40 mL Intracatheter Q12H   Continuous Infusions: . sodium chloride 250 mL (04/19/19 1224)     LOS: 23 days    Time spent: 34 minutes spent on chart review, discussion with nursing staff, consultants, updating family and interview/physical exam; more than 50% of that time was spent in counseling and/or coordination of care.    Hawa Henly J [place] DO [facility] Pager [phone]  If 7PM-7AM, please contact night-coverage [email or web address] Password [email or web address] 04/22/2019, 1:46 PM

## 2019-04-23 LAB — CBC
HCT: 22.5 % — ABNORMAL LOW (ref 39.0–52.0)
Hemoglobin: 7 g/dL — ABNORMAL LOW (ref 13.0–17.0)
MCH: 35.4 pg — ABNORMAL HIGH (ref 26.0–34.0)
MCHC: 31.1 g/dL (ref 30.0–36.0)
MCV: 113.6 fL — ABNORMAL HIGH (ref 80.0–100.0)
Platelets: DECREASED 10*3/uL (ref 150–400)
RBC: 1.98 MIL/uL — ABNORMAL LOW (ref 4.22–5.81)
RDW: 16.7 % — ABNORMAL HIGH (ref 11.5–15.5)
WBC: 7.3 10*3/uL (ref 4.0–10.5)
nRBC: 0 % (ref 0.0–0.2)

## 2019-04-23 LAB — BASIC METABOLIC PANEL
Anion gap: 8 (ref 5–15)
BUN: 14 mg/dL (ref 6–20)
CO2: 19 mmol/L — ABNORMAL LOW (ref 22–32)
Calcium: 8.1 mg/dL — ABNORMAL LOW (ref 8.9–10.3)
Chloride: 108 mmol/L (ref 98–111)
Creatinine, Ser: 0.82 mg/dL (ref 0.61–1.24)
GFR calc Af Amer: >60 mL/min (ref >60)
GFR calc non Af Amer: >60 mL/min (ref >60)
Glucose, Bld: 125 mg/dL — ABNORMAL HIGH (ref 70–99)
Potassium: 3.5 mmol/L (ref 3.5–5.1)
Sodium: 135 mmol/L (ref 135–145)

## 2019-04-23 LAB — MAGNESIUM: Magnesium: 1.3 mg/dL — ABNORMAL LOW (ref 1.7–2.4)

## 2019-04-23 MED ORDER — POTASSIUM CHLORIDE CRYS ER 20 MEQ PO TBCR
40.0000 meq | EXTENDED_RELEASE_TABLET | Freq: Once | ORAL | Status: AC
  Administered 2019-04-23: 40 meq via ORAL
  Filled 2019-04-23: qty 2

## 2019-04-23 MED ORDER — MAGNESIUM SULFATE 2 GM/50ML IV SOLN
2.0000 g | INTRAVENOUS | Status: AC
  Administered 2019-04-23 (×2): 2 g via INTRAVENOUS
  Filled 2019-04-23 (×2): qty 50

## 2019-04-23 NOTE — Progress Notes (Signed)
PROGRESS NOTE    Richard Calhoun  [id number] DOB: 08/10/1960 DOA: 03/30/2019 PCP: Charlott Rakes, MD    Brief Narrative:   58 year old male who presented with hematemesis and abdominal pain. He does have significant past medical history for chronic alcohol abuse, chronic tobacco abuse, coronary artery disease status post angioplasty,chronic heart failure, dyslipidemia, peripheral vascular disease and paroxysmal tachycardia. He reported malaise and hematemesis.He had poor oral intake for last 3 days prior to hospitalization. He had worsening left flank pain, and unwitnessed fall, nausea and vomiting for several days. On his initial physical examination his blood pressure was 109/52, heart rate 78, oxygen saturation 96%, rotation, heart S1-S2 present rhythmic, his abdomen was soft, left-sided hematoma,no lower extremity edema.  Sodium 139, potassium 4.9, chloride 99, bicarb 13, glucose 86, BUN 72, creatinine 3.6, AST 183, ALT 73, total bilirubins 2.6,white count 8.9, hemoglobin 8.5, hematocrit 26.0, platelets 39.SARS COVID-19 was negative. CT abdomen with and no acute changes.  Patient was admitted to the hospital with a working diagnosis of hematemesis due to upper GI bleed.  Patient was medically treated with IV Ativan, IV thiamine, Librium and IV octreotide/IV pantoprazole. He developed worsening mentation, failed noninvasive mechanical ventilation and was intubated September 13.  On September 16 patient underwent upper endoscopy showing esophageal varices and portal hypertensive gastropathy. His mentation improved and he was liberated from mechanical ventilation on September 18.  After liberation from mechanical ventilation patient continued to be encephalopathic, showing withdrawal symptoms, he required dexmedetomidine infusion along with benzodiazepines per protocol. He developed new onset atrial fibrillation with rapid ventricular response, his electrolytes have been  abnormal,and enteral nutrition was started by tube feeds on September 21  Assessment & Plan:   Principal Problem:   Acute hyperactive alcohol withdrawal delirium ([place]) Active Problems:   Anxiety state   EtOH dependence ([place])   Hypertensive heart disease without heart failure   S/P drug eluting coronary stent placement   Upper GI bleed   Acute renal failure (HCC)   Alcoholic hepatitis without ascites   Encephalopathy, hepatic (HCC)   Encounter for central line placement   Acute hypernatremia   Metabolic acidosis   Thrombocytopenia (HCC)   Left arm swelling   Hypomagnesemia   Chronic diastolic CHF (congestive heart failure) (HCC)   Macrocytic anemia   Atrial fibrillation, rapid ([place])   Left lower lobe aspiration pneumonitis  Acute hypoxic respiratory failure: Resolved His diet has been advanced with good toleration.  --Currently oxygenating well on room air --Continue with aspiration precautions. --Continue speech therapy  Hypoactive delirium, likely of critical illness Hepatic encephalopathy Delirium/encephalopathy has resolved, but remains with weak voice with difficult communication. --Enteral nutrition now has been transitioned to dysphagia 3 diet with thin liquids by speech therapy --Continue to monitor mental status closely --Aspiration precautions as above  Atrial fibrillation with RVR/ 2018 preserved LV systolic function. --Continue rate control with metoprolol 12.5mg  PO BID --Not candidate for anticoagulation due to fall risk and upper GI bleed with EV  Alcoholic hepatitis, in the setting of hepatic steatosis/ cirrhosis, due to alcohol abuse. No signs of persistent hepatic encephalopathy. --Continue to hold on lactulose.  --Continue to encourage increased oral intake --Avoid hepatotoxins   Upper GI bleed with acute blood loss anemia Esophageal varices EGD 9/16 grade 1 varices distal esophagus, severe diffuse portal hypertensive gastropathy, small  nonbleeding erosions gastric antrum without stigmata of recent bleeding, small dispersed erosions without bleeding duodenal bulb.  H. pylori IgG antibodies within normal limits. --Hgb 8.0-->7.4-->7.6-->7.0 --s/p transfusion 1u  pRBC on 9/21 and 9/30, s/p 1u FFP on 9/15 --Protonix 40 mg p.o. twice daily --Monitor CBC q3d  Pancreatic lesion Incidental finding of a 4.8 by 3.3 cm lesion with heterogeneous internal attenuation in the tail of the pancreas on the CT abdomen/pelvis on 03/30/2019.  MRCP on 04/20/2019 3.3 x 1.8 cm cystic lesion within the tail of the pancreas either representing a pseudocyst versus cystic neoplasm. --Will need repeat imaging of the pancreas with MRI/MRCP outpatient  AKI with metabolic acidosis/ hypokalemia and hypomagnesemianew hypernatremia.,  --Na 135 --encouraged increase oral intake --BMP q3d  Thrombocytopenia: stable with no signs of bleeding  Chronic diastolic heart failure/ coronary artery disease(2018 preserved LV systolic function). Edema likely secondary to low oncotic pressure secondary to hypoproteinemia. --Continue metoprolol for rate control  T2DM. Fasting glucose is 125 today --continue to hold on insulin therapy for now, poor oral intake and risk of hypoglycemia.  Calorie protein malnutrition, Unable to determine severity --nutrition following, continue nutritional supplements.   Severe debility/deconditioning With significant debility and deconditioning from prolonged hospitalization and critical illness.  Patient is uninsured and will have a difficulty obtaining SNF placement.  Continues to work with PT/OT with findings of unsteady with standing and pivoting. --Continue PT/OT/ST while inpatient --If unable to obtain SNF placement, patient and significant other need to find alternative means given that he is unsafe for discharge at this time due to he will be alone for multiple hours a day and he has severe deconditioning. --Social work  continues to work on options.   DVT prophylaxis: SCDs Code Status: Full code Family Communication: Patient and significant other, [name] updated by telephone Disposition Plan: Continue inpatient, PT recommends SNF, unfortunate does not have insurance, Medicare application pending, social work for coordination   Consultants:   Gastroenterology: Signed off 04/10/2019  PCCM  Procedures:   EGD 04/04/2019  Antimicrobials:  none   Subjective: Patient seen and examined at bedside, resting comfortably.  Continues to be frustrated and angry regarding lack of progress and not having therapy over the last several days.  Continues with severe weakness debility and relatively bedbound.  Worked with PT today with a buckling upon standing.  No other specific complaints this morning.  Denies headache, no fever/chills/night sweats, no chest pain, no palpitations, no abdominal pain.  No acute events overnight per nursing staff.  Objective: Vitals:   04/22/19 1326 04/22/19 1829 04/22/19 2103 04/23/19 0422  BP: 115/69 113/64 116/62 105/64  Pulse: 86 88 97 87  Resp: 16 15 16 19   Temp: 98.1 F (36.7 C) 98.6 F (37 C) 98.3 F (36.8 C) 98.2 F (36.8 C)  TempSrc: Oral Oral Oral Oral  SpO2: 99% 99% 100% 98%  Weight:      Height:        Intake/Output Summary (Last 24 hours) at 04/23/2019 1229 Last data filed at 04/23/2019 0949 Gross per 24 hour  Intake 535 ml  Output 1750 ml  Net -1215 ml   Filed Weights   04/14/19 1816 04/15/19 0414 04/16/19 0357  Weight: 85.6 kg 86.7 kg 85.6 kg    Examination:  General exam: Appears calm and comfortable, difficulty understanding secondary to his voice Respiratory system: Coarse breath sounds bilaterally, slightly decreased bilateral bases, no wheezing/crackles, normal respiratory effort, on room air. Cardiovascular system: S1 & S2 heard, RRR. No JVD, murmurs, rubs, gallops or clicks. No pedal edema. Gastrointestinal system: Abdomen is nondistended,  soft and nontender. No organomegaly or masses felt. Normal bowel sounds heard. Central nervous system: Alert  and oriented. No focal neurological deficits. Extremities: Generalized muscle weakness with atrophy, moves all extremities independently Skin: No rashes, lesions or ulcers Psychiatry: Judgement and insight appear normal. Mood & affect appropriate.     Data Reviewed: I have personally reviewed following labs and imaging studies  CBC: Recent Labs  Lab 04/18/19 0330 04/19/19 0308 04/20/19 0336 04/20/19 1349 04/21/19 0423 04/23/19 0443  WBC 8.2 9.1 7.9  --  7.0 7.3  NEUTROABS 7.1  --   --   --   --   --   HGB 6.9* 8.0* 7.4* 8.3* 7.6* 7.0*  HCT 21.9* 24.9* 23.3* 26.7* 24.2* 22.5*  MCV 118.4* 115.8* 116.5*  --  115.2* 113.6*  PLT PLATELET CLUMPS NOTED ON SMEAR, COUNT APPEARS DECREASED PLATELET CLUMPS NOTED ON SMEAR, UNABLE TO ESTIMATE PLATELET CLUMPS NOTED ON SMEAR, COUNT APPEARS DECREASED  --  PLATELET CLUMPS NOTED ON SMEAR, COUNT APPEARS ADEQUATE PLATELET CLUMPS NOTED ON SMEAR, COUNT APPEARS DECREASED   Basic Metabolic Panel: Recent Labs  Lab 04/18/19 0330 04/19/19 0308 04/20/19 0336 04/21/19 0423 04/23/19 0443  NA 141 137 136 136 135  K 3.8 3.5 4.3 4.2 3.5  CL 114* 110 110 109 108  CO2 21* 20* 19* 20* 19*  GLUCOSE 105* 121* 92 118* 125*  BUN 26* 19 18 19 14   CREATININE 1.05 0.86 0.84 0.84 0.82  CALCIUM 7.6* 7.7* 7.9* 8.0* 8.1*  MG  --  1.3* 1.9 1.6* 1.3*   GFR: Estimated Creatinine Clearance: 98.9 mL/min (by C-G formula based on SCr of 0.82 mg/dL). Liver Function Tests: No results for input(s): AST, ALT, ALKPHOS, BILITOT, PROT, ALBUMIN in the last 168 hours. No results for input(s): LIPASE, AMYLASE in the last 168 hours. No results for input(s): AMMONIA in the last 168 hours. Coagulation Profile: No results for input(s): INR, PROTIME in the last 168 hours. Cardiac Enzymes: No results for input(s): CKTOTAL, CKMB, CKMBINDEX, TROPONINI in the last 168 hours. BNP  (last 3 results) No results for input(s): PROBNP in the last 8760 hours. HbA1C: No results for input(s): HGBA1C in the last 72 hours. CBG: Recent Labs  Lab 04/16/19 1538 04/16/19 1932 04/17/19 0013 04/17/19 1208  GLUCAP 147* 117* 106* 110*   Lipid Profile: No results for input(s): CHOL, HDL, LDLCALC, TRIG, CHOLHDL, LDLDIRECT in the last 72 hours. Thyroid Function Tests: No results for input(s): TSH, T4TOTAL, FREET4, T3FREE, THYROIDAB in the last 72 hours. Anemia Panel: No results for input(s): VITAMINB12, FOLATE, FERRITIN, TIBC, IRON, RETICCTPCT in the last 72 hours. Sepsis Labs: No results for input(s): PROCALCITON, LATICACIDVEN in the last 168 hours.  No results found for this or any previous visit (from the past 240 hour(s)).       Radiology Studies: No results found.      Scheduled Meds:  sodium chloride   Intravenous Once   Chlorhexidine Gluconate Cloth  6 each Topical Daily   feeding supplement (ENSURE ENLIVE)  237 mL Oral BID BM   feeding supplement (PRO-STAT SUGAR FREE 64)  30 mL Oral Daily   Gerhardt's butt cream   Topical TID   hydrocortisone  25 mg Rectal QHS   mouth rinse  15 mL Mouth Rinse BID   metoprolol tartrate  12.5 mg Oral BID   miconazole nitrate   Topical BID   multivitamin with minerals  1 tablet Oral Daily   pantoprazole  40 mg Oral BID   sodium chloride flush  10-40 mL Intracatheter Q12H   Continuous Infusions:  sodium chloride 250 mL (04/19/19  1224)     LOS: 24 days    Time spent: 32 minutes spent on chart review, discussion with nursing staff, consultants, updating family and interview/physical exam; more than 50% of that time was spent in counseling and/or coordination of care.    Emonte Dieujuste J [place] DO [facility] Pager [phone]  If 7PM-7AM, please contact night-coverage [email or web address] Password [email or web address] 04/23/2019, 12:29 PM

## 2019-04-23 NOTE — Progress Notes (Signed)
Physical Therapy Treatment Patient Details Name: Richard Calhoun MRN: [id number] DOB: 1961-03-02 Today's Date: 04/23/2019    History of Present Illness 58 yo male admitted to ED on 9/10 for R flank pain, ETOH, N/V. Pt requiring intubation 9/13-9/18.Endoscopy reveals nonbleeding erosive gastropathy, duodenal erosions; placed feeding tube. Pt with new diagnosis LLL aspiration pneumonitis, hypoactive delirium. PMH includes anxiety, alcohol abuse, depression, GERD, HTN, HLD, tubular adenoma of colon, CAD with stent placement, PVD.    PT Comments    Pt able to stand with +2 and wide BOS, but several episodes of buckeling.  Pt transferred with scoot pivot transfer with drop arm recliner.  A trial with the Charlaine Dalton may be worth it.  Pt instructed to perform quad and glut sets to work on strengthening.   Follow Up Recommendations  SNF;Supervision/Assistance - 24 hour     Equipment Recommendations  None recommended by PT    Recommendations for Other Services       Precautions / Restrictions Precautions Precautions: Fall Precaution Comments: CIWA, very softspoken Restrictions Weight Bearing Restrictions: No    Mobility  Bed Mobility Overal bed mobility: Needs Assistance Bed Mobility: Supine to Sit     Supine to sit: Min assist;+2 for safety/equipment     General bed mobility comments: Pt able to initiate transfer and get legs off of bed and turn with rail, but needed A to get trunk fully upright.  Transfers Overall transfer level: Needs assistance Equipment used: Rolling walker (2 wheeled) Transfers: Sit to/from Stand;Lateral/Scoot Transfers Sit to Stand: Mod assist;+2 physical assistance;+2 safety/equipment        Lateral/Scoot Transfers: Min assist;Mod assist;+2 physical assistance;+2 safety/equipment General transfer comment: Pt stood to RW with MOD A of 2. Stood with wide BOS and weak knees with some "bouncing", but did have 2 episodes of buckeling and then progressive weakness  causing him to need to sit.  Performed a lateral scoot transfer with the drop arm recliner with cues for proper form and technique.  Ambulation/Gait             General Gait Details: Unable   Stairs             Wheelchair Mobility    Modified Rankin (Stroke Patients Only)       Balance   Sitting-balance support: Bilateral upper extremity supported;Feet supported Sitting balance-Leahy Scale: Fair     Standing balance support: Bilateral upper extremity supported Standing balance-Leahy Scale: Poor Standing balance comment: Requires UE support and had LE buckeling                            Cognition Arousal/Alertness: Awake/alert Behavior During Therapy: Flat affect Overall Cognitive Status: Within Functional Limits for tasks assessed                                        Exercises General Exercises - Lower Extremity Quad Sets: Strengthening;Both;5 reps Gluteal Sets: Both;5 reps    General Comments General comments (skin integrity, edema, etc.): pt talks in a whisper      Pertinent Vitals/Pain Pain Assessment: No/denies pain    Home Living                      Prior Function            PT Goals (current goals can now be found  in the care plan section) Acute Rehab PT Goals PT Goal Formulation: With patient Time For Goal Achievement: 05/07/19 Potential to Achieve Goals: Fair Progress towards PT goals: Progressing toward goals    Frequency    Min 2X/week      PT Plan Current plan remains appropriate    Co-evaluation              AM-PAC PT "6 Clicks" Mobility   Outcome Measure  Help needed turning from your back to your side while in a flat bed without using bedrails?: A Little Help needed moving from lying on your back to sitting on the side of a flat bed without using bedrails?: A Little Help needed moving to and from a bed to a chair (including a wheelchair)?: A Lot Help needed standing up  from a chair using your arms (e.g., wheelchair or bedside chair)?: A Lot Help needed to walk in hospital room?: Total Help needed climbing 3-5 steps with a railing? : Total 6 Click Score: 12    End of Session Equipment Utilized During Treatment: Gait belt Activity Tolerance: Patient limited by fatigue Patient left: in chair;with call bell/phone within reach;with chair alarm set Nurse Communication: Mobility status PT Visit Diagnosis: Muscle weakness (generalized) (M62.81);Other abnormalities of gait and mobility (R26.89)     Time: [id number] PT Time Calculation (min) (ACUTE ONLY): 20 min  Charges:  $Therapeutic Activity: 8-22 mins                     Carol Loftin L. Tamala Julian, [place] Pager [phone] 04/23/2019    Richard Calhoun 04/23/2019, 11:22 AM

## 2019-04-23 NOTE — Progress Notes (Signed)
  Speech Language Pathology Treatment: Dysphagia  Patient Details Name: Richard Calhoun MRN: [id number] DOB: August 15, 1960 Today's Date: 04/23/2019 Time: [id number] SLP Time Calculation (min) (ACUTE ONLY): 58 min  Assessment / Plan / Recommendation Clinical Impression  Pt with much improved tolerance of po intake compared to prior visits as well as his mentation, marginally improved phonation also noted; pt's significant other Richard Calhoun arrived during session and SLP educated her and the patient to normal MBS vs his study -  showing them videos;  Of note, pt is not conducting chin tuck but appears to be tolerating po diet c/b his stable vitals; Educated pt to compensation for his voice/phonatory deficits including chunking information to prevent from continued repetition; head turn right nor left improved phonation; SlP showed pt's significant other a Health visitor speaker on [place] to consider purchasing - advised it would also amplify his breathy portion of phonation also thus advised to purchase one on trial basis, pt making great gains, will intiiate RMST to improve laryngeal closure and hopefully phonation - pt and Richard Calhoun.   HPI HPI: 58 y.o. year old male admitted 03/30/2019 with reports of worsening left flank pain, unwitnessed fall, nausea and vomiting for several days and episode of hematemesis, concern for upper GI bleed causing AKI. PMH: chronic alcohol abuse, CAD s/p DES (2018), CHFpEF, HLD. Pt intubated 9/13-18/2020.  Pt has been npo with likely secretion aspiration and has had an NG placed.  NG was removed over the weekend at it was coiled in his nose.MD notified SLP of need for pt to have an evaluation to determine if he can consume po intake due to length of npo, hospital stay.      SLP Plan  Continue with current plan of care       Recommendations  Diet recommendations: Dysphagia 3 (mechanical soft);Thin liquid Liquids provided via: Straw;Cup Medication Administration: Whole  meds with puree Supervision: Patient able to self feed Compensations: Minimize environmental distractions;Slow rate;Small sips/bites Postural Changes and/or Swallow Maneuvers: Seated upright 90 degrees;Upright 30-60 min after meal                Oral Care Recommendations: Oral care QID;Staff/trained caregiver to provide oral care Follow up Recommendations: (TBD) SLP Visit Diagnosis: Dysphagia, unspecified (R13.10) Plan: Continue with current plan of care       GO                Richard Calhoun 04/23/2019, 6:38 PM Richard Calhoun, [place] SLP [place] Pager [phone] Office [phone]

## 2019-04-24 MED ORDER — PRO-STAT SUGAR FREE PO LIQD
30.0000 mL | Freq: Two times a day (BID) | ORAL | Status: DC
  Administered 2019-04-24 – 2019-04-27 (×5): 30 mL via ORAL
  Filled 2019-04-24 (×11): qty 30

## 2019-04-24 NOTE — TOC Progression Note (Signed)
Transition of Care [facility]) - Progression Note    Patient Details  Name: Richard Calhoun MRN: [id number] Date of Birth: 1961/01/20  Transition of Care [facility]) CM/SW Contact  Leeroy Cha, RN Phone Number: 04/24/2019, 3:05 PM  Clinical Narrative:    passar number : [id number] sent out with fl2 to area snf for consideration.     Expected Discharge Plan: Skilled Nursing Facility Barriers to Discharge: Continued Medical Work up, Inadequate or no insurance  Expected Discharge Plan and Services Expected Discharge Plan: [place] In-house Referral: Clinical Social Work Discharge Planning Services: CM Consult Post Acute Care Choice: [place] Living arrangements for the past 2 months: Single Family Home Expected Discharge Date: (unknown)                                     Social Determinants of Health (SDOH) Interventions    Readmission Risk Interventions Readmission Risk Prevention Plan 04/04/2019  Transportation Screening Complete  PCP or Specialist Appt within 3-5 Days Not Complete  Not Complete comments Not ready for dc  HRI or [place] Not Complete  HRI or Home Care Consult comments NA  Social Work Consult for [place] Planning/Counseling Not Complete  SW consult not completed comments Pt confused  Palliative Care Screening Not Applicable  Medication Review Press photographer) Complete  Some recent data might be hidden

## 2019-04-24 NOTE — Progress Notes (Signed)
PROGRESS NOTE    Richard Calhoun  [id number] DOB: [birth year] DOA: 03/30/2019 PCP: Charlott Rakes, MD    Brief Narrative:   58 year old male who presented with hematemesis and abdominal pain. He does have significant past medical history for chronic alcohol abuse, chronic tobacco abuse, coronary artery disease status post angioplasty,chronic heart failure, dyslipidemia, peripheral vascular disease and paroxysmal tachycardia. He reported malaise and hematemesis.He had poor oral intake for last 3 days prior to hospitalization. He had worsening left flank pain, and unwitnessed fall, nausea and vomiting for several days. On his initial physical examination his blood pressure was 109/52, heart rate 78, oxygen saturation 96%, rotation, heart S1-S2 present rhythmic, his abdomen was soft, left-sided hematoma,no lower extremity edema.  Sodium 139, potassium 4.9, chloride 99, bicarb 13, glucose 86, BUN 72, creatinine 3.6, AST 183, ALT 73, total bilirubins 2.6,white count 8.9, hemoglobin 8.5, hematocrit 26.0, platelets 39.SARS COVID-19 was negative. CT abdomen with and no acute changes.  Patient was admitted to the hospital with a working diagnosis of hematemesis due to upper GI bleed.  Patient was medically treated with IV Ativan, IV thiamine, Librium and IV octreotide/IV pantoprazole. He developed worsening mentation, failed noninvasive mechanical ventilation and was intubated September 13.  On September 16 patient underwent upper endoscopy showing esophageal varices and portal hypertensive gastropathy. His mentation improved and he was liberated from mechanical ventilation on September 18.  After liberation from mechanical ventilation patient continued to be encephalopathic, showing withdrawal symptoms, he required dexmedetomidine infusion along with benzodiazepines per protocol. He developed new onset atrial fibrillation with rapid ventricular response, his electrolytes have been  abnormal,and enteral nutrition was started by tube feeds on September 21  Assessment & Plan:   Principal Problem:   Acute hyperactive alcohol withdrawal delirium ([place]) Active Problems:   Anxiety state   EtOH dependence ([place])   Hypertensive heart disease without heart failure   S/P drug eluting coronary stent placement   Upper GI bleed   Acute renal failure (HCC)   Alcoholic hepatitis without ascites   Encephalopathy, hepatic (HCC)   Encounter for central line placement   Acute hypernatremia   Metabolic acidosis   Thrombocytopenia (HCC)   Left arm swelling   Hypomagnesemia   Chronic diastolic CHF (congestive heart failure) (HCC)   Macrocytic anemia   Atrial fibrillation, rapid ([place])   Left lower lobe aspiration pneumonitis  Acute hypoxic respiratory failure: Resolved His diet has been advanced with good toleration.  --Currently oxygenating well on room air --Continue with aspiration precautions. --Continue speech therapy  Hypoactive delirium, likely of critical illness Hepatic encephalopathy Delirium/encephalopathy has resolved, but remains with weak voice with difficult communication. --Enteral nutrition now has been transitioned to dysphagia 3 diet with thin liquids by speech therapy --Continue to monitor mental status closely --Aspiration precautions as above  Paroxysmal atrial fibrillation with RVR/ 2018 preserved LV systolic function. --Continue rate control with metoprolol 12.5mg  PO BID --Not candidate for anticoagulation due to fall risk and upper GI bleed with EV  Alcoholic hepatitis, in the setting of hepatic steatosis/ cirrhosis, due to alcohol abuse. No signs of persistent hepatic encephalopathy. --Continue to hold on lactulose.  --Continue to encourage increased oral intake --Avoid hepatotoxins   Upper GI bleed with acute blood loss anemia Esophageal varices EGD 9/16 grade 1 varices distal esophagus, severe diffuse portal hypertensive  gastropathy, small nonbleeding erosions gastric antrum without stigmata of recent bleeding, small dispersed erosions without bleeding duodenal bulb.  H. pylori IgG antibodies within normal limits. --Hgb 8.0-->7.4-->7.6-->7.0 --s/p transfusion  1u pRBC on 9/21 and 9/30, s/p 1u FFP on 9/15 --Protonix 40 mg p.o. twice daily --Monitor CBC q3d  Pancreatic lesion Incidental finding of a 4.8 by 3.3 cm lesion with heterogeneous internal attenuation in the tail of the pancreas on the CT abdomen/pelvis on 03/30/2019.  MRCP on 04/20/2019 3.3 x 1.8 cm cystic lesion within the tail of the pancreas either representing a pseudocyst versus cystic neoplasm. --Will need repeat imaging of the pancreas with MRI/MRCP outpatient  AKI with metabolic acidosis/ hypokalemia and hypomagnesemianew hypernatremia.,  --Na 135 --encouraged increase oral intake --BMP q3d  Thrombocytopenia: stable with no signs of bleeding  Chronic diastolic heart failure/ coronary artery disease(2018 preserved LV systolic function). Edema likely secondary to low oncotic pressure secondary to hypoproteinemia. --Continue metoprolol for rate control  T2DM. Fasting glucose is 125 --continue to hold on insulin therapy for now, poor oral intake and risk of hypoglycemia.  Moderate calorie protein malnutrition --nutrition following, continue nutritional supplements.   Severe debility/deconditioning With significant debility and deconditioning from prolonged hospitalization and critical illness.  Patient is uninsured and will have a difficulty obtaining SNF placement.  Continues to work with PT/OT with findings of unsteady with standing and pivoting. --Continue PT/OT/ST while inpatient --If unable to obtain SNF placement, patient and significant other need to find alternative means given that he is unsafe for discharge at this time due to he will be alone for multiple hours a day and he has severe deconditioning. --Social work  continues to work on options.   DVT prophylaxis: SCDs Code Status: Full code Family Communication: None present at bedside Disposition Plan: Continue inpatient, PT recommends SNF, unfortunate does not have insurance, Medicare application pending, social work for coordination   Consultants:   Gastroenterology: Signed off 04/10/2019  PCCM  Procedures:   EGD 04/04/2019  Antimicrobials:  none   Subjective: Patient seen and examined at bedside, resting comfortably.  Continues with significant weakness/debility.  Worked with physical therapy and speech therapy yesterday.  Continues with weak and indiscernible voice at times.  Continues to be frustrated with progress.  No other specific complaints this morning.  Denies headache, no fever/chills/night sweats, no chest pain, no palpitations, no abdominal pain.  No acute events overnight per nursing staff.  Objective: Vitals:   04/23/19 1357 04/23/19 2117 04/24/19 0504 04/24/19 1302  BP: 112/65 133/68 130/72 104/61  Pulse: 83 95 88 86  Resp: 18 18 18 17   Temp: 99.1 F (37.3 C) 97.8 F (36.6 C) 98.1 F (36.7 C) 98.1 F (36.7 C)  TempSrc: Oral Oral Oral Oral  SpO2: 99% 99% 99% 99%  Weight:      Height:        Intake/Output Summary (Last 24 hours) at 04/24/2019 1406 Last data filed at 04/24/2019 1341 Gross per 24 hour  Intake 2280 ml  Output 4550 ml  Net -2270 ml   Filed Weights   04/14/19 1816 04/15/19 0414 04/16/19 0357  Weight: 85.6 kg 86.7 kg 85.6 kg    Examination:  General exam: Appears calm and comfortable, difficulty understanding secondary to his voice Respiratory system: Coarse breath sounds bilaterally, slightly decreased bilateral bases, no wheezing/crackles, normal respiratory effort, on room air. Cardiovascular system: S1 & S2 heard, RRR. No JVD, murmurs, rubs, gallops or clicks. No pedal edema. Gastrointestinal system: Abdomen is nondistended, soft and nontender. No organomegaly or masses felt. Normal bowel  sounds heard. Central nervous system: Alert and oriented. No focal neurological deficits. Extremities: Generalized muscle weakness with atrophy, moves all extremities independently  Skin: No rashes, lesions or ulcers Psychiatry: Judgement and insight appear normal. Mood & affect appropriate.     Data Reviewed: I have personally reviewed following labs and imaging studies  CBC: Recent Labs  Lab 04/18/19 0330 04/19/19 0308 04/20/19 0336 04/20/19 1349 04/21/19 0423 04/23/19 0443  WBC 8.2 9.1 7.9  --  7.0 7.3  NEUTROABS 7.1  --   --   --   --   --   HGB 6.9* 8.0* 7.4* 8.3* 7.6* 7.0*  HCT 21.9* 24.9* 23.3* 26.7* 24.2* 22.5*  MCV 118.4* 115.8* 116.5*  --  115.2* 113.6*  PLT PLATELET CLUMPS NOTED ON SMEAR, COUNT APPEARS DECREASED PLATELET CLUMPS NOTED ON SMEAR, UNABLE TO ESTIMATE PLATELET CLUMPS NOTED ON SMEAR, COUNT APPEARS DECREASED  --  PLATELET CLUMPS NOTED ON SMEAR, COUNT APPEARS ADEQUATE PLATELET CLUMPS NOTED ON SMEAR, COUNT APPEARS DECREASED   Basic Metabolic Panel: Recent Labs  Lab 04/18/19 0330 04/19/19 0308 04/20/19 0336 04/21/19 0423 04/23/19 0443  NA 141 137 136 136 135  K 3.8 3.5 4.3 4.2 3.5  CL 114* 110 110 109 108  CO2 21* 20* 19* 20* 19*  GLUCOSE 105* 121* 92 118* 125*  BUN 26* 19 18 19 14   CREATININE 1.05 0.86 0.84 0.84 0.82  CALCIUM 7.6* 7.7* 7.9* 8.0* 8.1*  MG  --  1.3* 1.9 1.6* 1.3*   GFR: Estimated Creatinine Clearance: 98.9 mL/min (by C-G formula based on SCr of 0.82 mg/dL). Liver Function Tests: No results for input(s): AST, ALT, ALKPHOS, BILITOT, PROT, ALBUMIN in the last 168 hours. No results for input(s): LIPASE, AMYLASE in the last 168 hours. No results for input(s): AMMONIA in the last 168 hours. Coagulation Profile: No results for input(s): INR, PROTIME in the last 168 hours. Cardiac Enzymes: No results for input(s): CKTOTAL, CKMB, CKMBINDEX, TROPONINI in the last 168 hours. BNP (last 3 results) No results for input(s): PROBNP in the last  8760 hours. HbA1C: No results for input(s): HGBA1C in the last 72 hours. CBG: No results for input(s): GLUCAP in the last 168 hours. Lipid Profile: No results for input(s): CHOL, HDL, LDLCALC, TRIG, CHOLHDL, LDLDIRECT in the last 72 hours. Thyroid Function Tests: No results for input(s): TSH, T4TOTAL, FREET4, T3FREE, THYROIDAB in the last 72 hours. Anemia Panel: No results for input(s): VITAMINB12, FOLATE, FERRITIN, TIBC, IRON, RETICCTPCT in the last 72 hours. Sepsis Labs: No results for input(s): PROCALCITON, LATICACIDVEN in the last 168 hours.  No results found for this or any previous visit (from the past 240 hour(s)).       Radiology Studies: No results found.      Scheduled Meds:  sodium chloride   Intravenous Once   Chlorhexidine Gluconate Cloth  6 each Topical Daily   feeding supplement (PRO-STAT SUGAR FREE 64)  30 mL Oral BID   Gerhardt's butt cream   Topical TID   hydrocortisone  25 mg Rectal QHS   mouth rinse  15 mL Mouth Rinse BID   metoprolol tartrate  12.5 mg Oral BID   miconazole nitrate   Topical BID   multivitamin with minerals  1 tablet Oral Daily   pantoprazole  40 mg Oral BID   sodium chloride flush  10-40 mL Intracatheter Q12H   Continuous Infusions:  sodium chloride 250 mL (04/19/19 1224)     LOS: 25 days    Time spent: 32 minutes spent on chart review, discussion with nursing staff, consultants, updating family and interview/physical exam; more than 50% of that time was spent in  counseling and/or coordination of care.    Omunique Pederson J [place] DO [facility] Pager [phone]  If 7PM-7AM, please contact night-coverage [email or web address] Password [email or web address] 04/24/2019, 2:06 PM

## 2019-04-24 NOTE — TOC Progression Note (Signed)
Transition of Care [facility]) - Progression Note    Patient Details  Name: Richard Calhoun MRN: [id number] Date of Birth: 11-Dec-1960  Transition of Care [facility]) CM/SW Contact  Leeroy Cha, RN Phone Number: 04/24/2019, 12:30 PM  Clinical Narrative:    Working with [facility] Males in [facility] to get medicaid and disability.   Expected Discharge Plan: Skilled Nursing Facility Barriers to Discharge: Continued Medical Work up, Inadequate or no insurance  Expected Discharge Plan and Services Expected Discharge Plan: [place] In-house Referral: Clinical Social Work Discharge Planning Services: CM Consult Post Acute Care Choice: [place] Living arrangements for the past 2 months: Single Family Home Expected Discharge Date: (unknown)                                     Social Determinants of Health (SDOH) Interventions    Readmission Risk Interventions Readmission Risk Prevention Plan 04/04/2019  Transportation Screening Complete  PCP or Specialist Appt within 3-5 Days Not Complete  Not Complete comments Not ready for dc  HRI or [place] Not Complete  HRI or Home Care Consult comments NA  Social Work Consult for [place] Planning/Counseling Not Complete  SW consult not completed comments Pt confused  Palliative Care Screening Not Applicable  Medication Review Press photographer) Complete  Some recent data might be hidden

## 2019-04-24 NOTE — NC FL2 (Signed)
[place] LEVEL OF CARE SCREENING TOOL     IDENTIFICATION  Patient Name: Richard Calhoun Birthdate: 1961/01/09 Sex: male Admission Date (Current Location): 03/30/2019  [facility] and [place] Number:  Herbalist and Address:         Provider Number: [id number]  Attending Physician Name and Address:  [place] Donnamarie Poag, DO  Relative Name and Phone Number:       Current Level of Care: Hospital Recommended Level of Care: [place] Prior Approval Number:    Date Approved/Denied:   PASRR Number: [id number]  Discharge Plan: SNF    Current Diagnoses: Patient Active Problem List   Diagnosis Date Noted  . Acute hypoactive delirium due to another medical condition 04/10/2019  . Atrial fibrillation, rapid ([place]) 04/09/2019  . Acute hypernatremia 04/07/2019  . Metabolic acidosis [id number]  . Thrombocytopenia ([place]) 04/07/2019  . Left arm swelling 04/07/2019  . Hypomagnesemia 04/07/2019  . Chronic diastolic CHF (congestive heart failure) ([place]) 04/07/2019  . Macrocytic anemia 04/07/2019  . Encounter for central line placement   . Flank pain   . Tachypnea   . Airway intubation performed without difficulty   . Encephalopathy, hepatic ([place])   . Acute renal failure ([place])   . Alcoholic hepatitis without ascites   . Upper GI bleed 03/30/2019  . Decreased pulses in feet 11/16/2018  . Pain in both lower extremities 11/16/2018  . Localized swelling of both lower legs 11/16/2018  . S/P drug eluting coronary stent placement 07/20/2017  . Tinnitus of left ear 07/08/2017  . Hand edema 04/19/2017  . Hypophosphatemia 04/19/2017  . Coronary artery disease involving native coronary artery of native heart without angina pectoris   . Acute hyperactive alcohol withdrawal delirium ([place])   . Chest pain on exertion 04/15/2017  . Chest pain 04/14/2017  . Hypokalemia 04/14/2017  . Tobacco abuse 04/14/2017  . Hypertensive heart disease without heart failure  03/25/2017  . Tachycardia 03/25/2017  . Infected sebaceous cyst of skin 10/23/2012  . Diarrhea 08/29/2012  . Impaired glucose tolerance 05/18/2011  . Preventative health care 05/16/2011  . BICEPS TENDON RUPTURE, RIGHT 06/01/2010  . Anxiety state 10/30/2009  . ANGIOEDEMA 04/24/2009  . Hyperlipidemia LDL goal <70 02/21/2007  . EtOH dependence ([place]) 02/21/2007  . DEPRESSION 02/21/2007  . Uncontrolled hypertension 02/21/2007  . HEMORRHOIDS 02/21/2007  . ALLERGIC RHINITIS 02/21/2007  . GERD 02/21/2007  . OSTEOARTHRITIS 02/21/2007  . LOW BACK PAIN 02/21/2007  . Headache(784.0) 02/21/2007  . COLONIC POLYPS, HX OF 02/21/2007    Orientation RESPIRATION BLADDER Height & Weight     Self, Time, Situation, Place  Normal Continent Weight: 85.6 kg Height:  5\' 10"  (177.8 cm)  BEHAVIORAL SYMPTOMS/MOOD NEUROLOGICAL BOWEL NUTRITION STATUS      Continent Diet(regular)  AMBULATORY STATUS COMMUNICATION OF NEEDS Skin   Extensive Assist(pt x5 weekly) Verbally Normal                       Personal Care Assistance Level of Assistance  Bathing, Feeding, Dressing Bathing Assistance: Limited assistance Feeding assistance: Limited assistance Dressing Assistance: Limited assistance     Functional Limitations Info  Speech     Speech Info: Impaired    SPECIAL CARE FACTORS FREQUENCY  PT (By licensed PT), OT (By licensed OT), Speech therapy     PT Frequency: 5xweekly OT Frequency: 5x weekly     Speech Therapy Frequency: 3-5 x weekly      Contractures Contractures Info: Not present  Additional Factors Info  Code Status Code Status Info: full             Current Medications (04/24/2019):  This is the current hospital active medication list Current Facility-Administered Medications  Medication Dose Route Frequency Provider Last Rate Last Dose  . 0.9 %  sodium chloride infusion (Manually program via Guardrails IV Fluids)   Intravenous Once Schorr, Rhetta Mura, NP      . 0.9 %   sodium chloride infusion  250 mL Intravenous Continuous Margaretha Seeds, MD 10 mL/hr at 04/19/19 1224 250 mL at 04/19/19 1224  . alum & mag hydroxide-simeth (MAALOX/MYLANTA) 200-200-20 MG/5ML suspension 15 mL  15 mL Oral Q6H PRN [place] Eric J, DO   15 mL at 04/20/19 1451  . Chlorhexidine Gluconate Cloth 2 % PADS 6 each  6 each Topical Daily Nita Sells, MD   6 each at 04/24/19 0929  . feeding supplement (PRO-STAT SUGAR FREE 64) liquid 30 mL  30 mL Oral BID [place] Eric J, DO      . Gerhardt's butt cream   Topical TID Arrien, Jimmy Picket, MD      . hydrocortisone (ANUSOL-HC) suppository 25 mg  25 mg Rectal QHS Mansouraty, Telford Nab., MD   25 mg at 04/22/19 2136  . iohexol (OMNIPAQUE) 300 MG/ML solution 50 mL  50 mL Per Tube Once PRN Oretha Milch D, MD      . levalbuterol (XOPENEX) nebulizer solution 0.63 mg  0.63 mg Nebulization Q6H PRN Blount, Scarlette Shorts T, NP      . lip balm (CARMEX) ointment 1 application  1 application Topical PRN Oretha Milch D, MD      . loperamide (IMODIUM) capsule 2 mg  2 mg Oral PRN [place] Donnamarie Poag, DO   2 mg at 04/23/19 1105  . MEDLINE mouth rinse  15 mL Mouth Rinse BID Icard, Bradley L, DO   15 mL at 04/24/19 0929  . menthol-cetylpyridinium (CEPACOL) lozenge 3 mg  1 lozenge Oral PRN [place] Donnamarie Poag, DO      . metoprolol tartrate (LOPRESSOR) tablet 12.5 mg  12.5 mg Oral BID Tawni Millers, MD   12.5 mg at 04/24/19 0918  . miconazole nitrate (MICATIN) topical powder   Topical BID [place] Eric J, DO      . multivitamin with minerals tablet 1 tablet  1 tablet Oral Daily [place] Eric J, DO   1 tablet at 04/24/19 [id number]  . pantoprazole (PROTONIX) EC tablet 40 mg  40 mg Oral BID [place] Eric J, DO   40 mg at 04/24/19 [id number]  . phenol (CHLORASEPTIC) mouth spray 1 spray  1 spray Mouth/Throat PRN Wynonia Musty, RN      . sodium chloride flush (NS) 0.9 % injection 10-40 mL  10-40 mL Intracatheter Q12H Nita Sells, MD   10 mL at 04/20/19 2149  .  sodium chloride flush (NS) 0.9 % injection 10-40 mL  10-40 mL Intracatheter PRN Nita Sells, MD   10 mL at 04/21/19 0424     Discharge Medications: Please see discharge summary for a list of discharge medications.  Relevant Imaging Results:  Relevant Lab Results:   Additional Information ssn:[id number]  Leeroy Cha, RN

## 2019-04-24 NOTE — Progress Notes (Signed)
Nutrition Follow-up  INTERVENTION:   -D/c Ensure -Prostat liquid protein PO 30 ml BID with meals, each supplement provides 100 kcal, 15 grams protein. -Multivitamin with minerals daily -Magic cup BID with meals, each supplement provides 290 kcal and 9 grams of protein  NUTRITION DIAGNOSIS:   Increased nutrient needs related to acute illness as evidenced by estimated needs.  Ongoing.  GOAL:   Patient will meet greater than or equal to 90% of their needs  Progressing.  MONITOR:   PO intake, Supplement acceptance, Labs, Weight trends  ASSESSMENT:   58 year-old male with medical history of chronic alcohol abuse (half gallon of rum/day), CAD and PCI, unstable angina with stent placement in 2018, CHF, hyperlipidemia, mild peripheral vascular disease, and tachycardia. Patient presented to the ED on 9/11 d/t increasing malaise and increased sleeping duration x5 days, vomiting and poor PO intakes x3 days.  Significant Events: 9/11- admission 9/13- intubation; OGT placement 9/14- initial RD assessment 9/16- OGT removed and replaced 9/18- extubation and OGT removed 9/19- diet advanced from NPO to [place] 9/20- returned to NPO 9/22- NGT placement; TF initiation  9/26- TF stopped; NGT removed 9/28- diet advanced to Dysphagia 3, thin liquids  **RD working remotely**  Patient currently consuming 20-50% of meals at this time. Pt is not drinking Ensure supplements, will d/c. Pt is taking Prostat, will continue.  Admission weight: 195 lbs. Current weight: 188 lbs. I/Os:-5L since 9/22 UOP: 800 ml so far today  Medications: Multivitamin with minerals daily Labs reviewed:  Low Mg  Diet Order:   Diet Order            DIET DYS 3 Room service appropriate? No; Fluid consistency: Thin  Diet effective now              EDUCATION NEEDS:   No education needs have been identified at this time  Skin:  Skin Assessment: Reviewed RN Assessment  Last BM:  10/6  Height:   Ht Readings  from Last 1 Encounters:  04/14/19 5\' 10"  (1.778 m)    Weight:   Wt Readings from Last 1 Encounters:  04/16/19 85.6 kg    Ideal Body Weight:  75.4 kg  BMI:  Body mass index is 27.08 kg/m.  Estimated Nutritional Needs:   Kcal:  1900-2100 kcal  Protein:  95-105 grams  Fluid:  >/= 2 L/day  Clayton Bibles, MS, RD, LDN Inpatient Clinical Dietitian Pager: [phone] After Hours Pager: [phone]

## 2019-04-25 ENCOUNTER — Inpatient Hospital Stay (HOSPITAL_COMMUNITY): Payer: Self-pay

## 2019-04-25 DIAGNOSIS — R609 Edema, unspecified: Secondary | ICD-10-CM

## 2019-04-25 LAB — URIC ACID: Uric Acid, Serum: 5.1 mg/dL (ref 3.7–8.6)

## 2019-04-25 MED ORDER — DOCUSATE SODIUM 100 MG PO CAPS
100.0000 mg | ORAL_CAPSULE | Freq: Two times a day (BID) | ORAL | Status: DC
  Administered 2019-04-25 – 2019-05-25 (×56): 100 mg via ORAL
  Filled 2019-04-25 (×60): qty 1

## 2019-04-25 MED ORDER — POLYETHYLENE GLYCOL 3350 17 G PO PACK
17.0000 g | PACK | Freq: Every day | ORAL | Status: DC
  Administered 2019-04-25: 17 g via ORAL
  Filled 2019-04-25: qty 1

## 2019-04-25 NOTE — Progress Notes (Signed)
Lower extremity venous has been completed.   Preliminary results in CV Proc.   Abram Sander 04/25/2019 9:47 AM

## 2019-04-25 NOTE — Progress Notes (Signed)
PROGRESS NOTE    Richard Calhoun  [id number] DOB: 1960/08/25 DOA: 03/30/2019 PCP: Charlott Rakes, MD    Brief Narrative:   58 year old male who presented with hematemesis and abdominal pain. He does have significant past medical history for chronic alcohol abuse, chronic tobacco abuse, coronary artery disease status post angioplasty,chronic heart failure, dyslipidemia, peripheral vascular disease and paroxysmal tachycardia. He reported malaise and hematemesis.He had poor oral intake for last 3 days prior to hospitalization. He had worsening left flank pain, and unwitnessed fall, nausea and vomiting for several days. On his initial physical examination his blood pressure was 109/52, heart rate 78, oxygen saturation 96%, rotation, heart S1-S2 present rhythmic, his abdomen was soft, left-sided hematoma,no lower extremity edema.  Sodium 139, potassium 4.9, chloride 99, bicarb 13, glucose 86, BUN 72, creatinine 3.6, AST 183, ALT 73, total bilirubins 2.6,white count 8.9, hemoglobin 8.5, hematocrit 26.0, platelets 39.SARS COVID-19 was negative. CT abdomen with and no acute changes.  Patient was admitted to the hospital with a working diagnosis of hematemesis due to upper GI bleed.  Patient was medically treated with IV Ativan, IV thiamine, Librium and IV octreotide/IV pantoprazole. He developed worsening mentation, failed noninvasive mechanical ventilation and was intubated September 13.  On September 16 patient underwent upper endoscopy showing esophageal varices and portal hypertensive gastropathy. His mentation improved and he was liberated from mechanical ventilation on September 18.  After liberation from mechanical ventilation patient continued to be encephalopathic, showing withdrawal symptoms, he required dexmedetomidine infusion along with benzodiazepines per protocol. He developed new onset atrial fibrillation with rapid ventricular response, his electrolytes have been  abnormal,and enteral nutrition was started by tube feeds on September 21  Assessment & Plan:   Principal Problem:   Acute hyperactive alcohol withdrawal delirium ([place]) Active Problems:   Anxiety state   EtOH dependence ([place])   Hypertensive heart disease without heart failure   S/P drug eluting coronary stent placement   Upper GI bleed   Acute renal failure ([facility])   Alcoholic hepatitis without ascites   Encephalopathy, hepatic ([facility])   Encounter for central line placement   Acute hypernatremia   Metabolic acidosis   Thrombocytopenia ([facility])   Left arm swelling   Hypomagnesemia   Chronic diastolic CHF (congestive heart failure) ([facility])   Macrocytic anemia   Atrial fibrillation, rapid ([place])   Left lower lobe aspiration pneumonitis  Acute hypoxic respiratory failure: Resolved His diet has been advanced with good toleration.  --Currently oxygenating well on room air --Continue with aspiration precautions. --Continue speech therapy  Hypoactive delirium, likely of critical illness Hepatic encephalopathy Delirium/encephalopathy has resolved, but remains with weak voice with difficult communication. --Enteral nutrition now has been transitioned to dysphagia 3 diet with thin liquids by speech therapy --Continue to monitor mental status closely --Aspiration precautions as above  Paroxysmal atrial fibrillation with RVR/ 2018 preserved LV systolic function. --Continue rate control with metoprolol 12.5mg  PO BID --Not candidate for anticoagulation due to fall risk and upper GI bleed with EV  Alcoholic hepatitis, in the setting of hepatic steatosis/ cirrhosis, due to alcohol abuse. No signs of persistent hepatic encephalopathy. --Continue to hold on lactulose.  --Continue to encourage increased oral intake --Avoid hepatotoxins   Upper GI bleed with acute blood loss anemia Esophageal varices EGD 9/16 grade 1 varices distal esophagus, severe diffuse portal hypertensive  gastropathy, small nonbleeding erosions gastric antrum without stigmata of recent bleeding, small dispersed erosions without bleeding duodenal bulb.  H. pylori IgG antibodies within normal limits. --Hgb 8.0-->7.4-->7.6-->7.0 --s/p transfusion  1u pRBC on 9/21 and 9/30, s/p 1u FFP on 9/15 --Protonix 40 mg p.o. twice daily --Monitor CBC q3d  Pancreatic lesion Incidental finding of a 4.8 by 3.3 cm lesion with heterogeneous internal attenuation in the tail of the pancreas on the CT abdomen/pelvis on 03/30/2019.  MRCP on 04/20/2019 3.3 x 1.8 cm cystic lesion within the tail of the pancreas either representing a pseudocyst versus cystic neoplasm. --Will need repeat imaging of the pancreas with MRI/MRCP outpatient  AKI with metabolic acidosis/ hypokalemia and hypomagnesemianew hypernatremia.,  --Na 135 --encouraged increase oral intake --BMP q3d  Thrombocytopenia: stable with no signs of bleeding  Chronic diastolic heart failure/ coronary artery disease(2018 preserved LV systolic function). Edema likely secondary to low oncotic pressure secondary to hypoproteinemia. --Continue metoprolol for rate control  T2DM. Fasting glucose is 125 --continue to hold on insulin therapy for now, poor oral intake and risk of hypoglycemia.  Right foot pain/edema Patient complaining of acute onset right foot pain and edema, no history of trauma.  Suspect mild fluid accumulation in the setting of malnutrition.  Uric acid level 5.1, within normal limits.  Vascular ultrasound duplex venous scan right lower extremity negative for DVT. --Continue supportive measures --Encourage increased oral intake  Constipation Patient reports no bowel movement for 3 days, previously was having diarrhea. --MiraLAX p.o. daily --Colace 100 mg p.o. twice daily --Continue to monitor strict I's and O's  Moderate calorie protein malnutrition --nutrition following, continue nutritional supplements.   Severe  debility/deconditioning With significant debility and deconditioning from prolonged hospitalization and critical illness.  Patient is uninsured and will have a difficulty obtaining SNF placement.  Continues to work with PT/OT with findings of unsteady with standing and pivoting. --Continue PT/OT/ST while inpatient --If unable to obtain SNF placement, patient and significant other need to find alternative means given that he is unsafe for discharge at this time due to he will be alone for multiple hours a day and he has severe deconditioning. --Social work continues to work on options.   DVT prophylaxis: SCDs Code Status: Full code Family Communication: None present at bedside Disposition Plan: Continue inpatient, PT recommends SNF, unfortunate does not have insurance, Medicare application pending, social work for coordination   Consultants:   Gastroenterology: Signed off 04/10/2019  PCCM  Procedures:   EGD 04/04/2019  Antimicrobials:  none   Subjective: Patient seen and examined at bedside, resting comfortably.  Continues with significant weakness/debility.  Complains of acute onset right foot pain/swelling.  Vascular ultrasound negative for DVT and uric acid within normal limits.  Complains of constipation.  No other specific complaints this morning.  Denies headache, no fever/chills/night sweats, no chest pain, no palpitations, no abdominal pain.  No acute events overnight per nursing staff.  Objective: Vitals:   04/24/19 1302 04/24/19 2024 04/25/19 0514 04/25/19 1258  BP: 104/61 114/73 115/62 128/65  Pulse: 86 97 90 92  Resp: 17 18 18    Temp: 98.1 F (36.7 C) 99.2 F (37.3 C) 98.1 F (36.7 C) 98.4 F (36.9 C)  TempSrc: Oral Oral Oral Oral  SpO2: 99% 99% 100% 98%  Weight:      Height:        Intake/Output Summary (Last 24 hours) at 04/25/2019 1504 Last data filed at 04/25/2019 1417 Gross per 24 hour  Intake 2010 ml  Output 3200 ml  Net -1190 ml   Filed Weights    04/14/19 1816 04/15/19 0414 04/16/19 0357  Weight: 85.6 kg 86.7 kg 85.6 kg    Examination:  General exam: Appears calm and comfortable, difficulty understanding secondary to his voice Respiratory system: Coarse breath sounds bilaterally, slightly decreased bilateral bases, no wheezing/crackles, normal respiratory effort, on room air. Cardiovascular system: S1 & S2 heard, RRR. No JVD, murmurs, rubs, gallops or clicks. No pedal edema. Gastrointestinal system: Abdomen is nondistended, soft and nontender. No organomegaly or masses felt. Normal bowel sounds heard. Central nervous system: Alert and oriented. No focal neurological deficits. Extremities: Generalized muscle weakness with atrophy, moves all extremities independently Skin: No rashes, lesions or ulcers Psychiatry: Judgement and insight appear normal. Mood & affect appropriate.     Data Reviewed: I have personally reviewed following labs and imaging studies  CBC: Recent Labs  Lab 04/19/19 0308 04/20/19 0336 04/20/19 1349 04/21/19 0423 04/23/19 0443  WBC 9.1 7.9  --  7.0 7.3  HGB 8.0* 7.4* 8.3* 7.6* 7.0*  HCT 24.9* 23.3* 26.7* 24.2* 22.5*  MCV 115.8* 116.5*  --  115.2* 113.6*  PLT PLATELET CLUMPS NOTED ON SMEAR, UNABLE TO ESTIMATE PLATELET CLUMPS NOTED ON SMEAR, COUNT APPEARS DECREASED  --  PLATELET CLUMPS NOTED ON SMEAR, COUNT APPEARS ADEQUATE PLATELET CLUMPS NOTED ON SMEAR, COUNT APPEARS DECREASED   Basic Metabolic Panel: Recent Labs  Lab 04/19/19 0308 04/20/19 0336 04/21/19 0423 04/23/19 0443  NA 137 136 136 135  K 3.5 4.3 4.2 3.5  CL 110 110 109 108  CO2 20* 19* 20* 19*  GLUCOSE 121* 92 118* 125*  BUN 19 18 19 14   CREATININE 0.86 0.84 0.84 0.82  CALCIUM 7.7* 7.9* 8.0* 8.1*  MG 1.3* 1.9 1.6* 1.3*   GFR: Estimated Creatinine Clearance: 98.9 mL/min (by C-G formula based on SCr of 0.82 mg/dL). Liver Function Tests: No results for input(s): AST, ALT, ALKPHOS, BILITOT, PROT, ALBUMIN in the last 168 hours. No  results for input(s): LIPASE, AMYLASE in the last 168 hours. No results for input(s): AMMONIA in the last 168 hours. Coagulation Profile: No results for input(s): INR, PROTIME in the last 168 hours. Cardiac Enzymes: No results for input(s): CKTOTAL, CKMB, CKMBINDEX, TROPONINI in the last 168 hours. BNP (last 3 results) No results for input(s): PROBNP in the last 8760 hours. HbA1C: No results for input(s): HGBA1C in the last 72 hours. CBG: No results for input(s): GLUCAP in the last 168 hours. Lipid Profile: No results for input(s): CHOL, HDL, LDLCALC, TRIG, CHOLHDL, LDLDIRECT in the last 72 hours. Thyroid Function Tests: No results for input(s): TSH, T4TOTAL, FREET4, T3FREE, THYROIDAB in the last 72 hours. Anemia Panel: No results for input(s): VITAMINB12, FOLATE, FERRITIN, TIBC, IRON, RETICCTPCT in the last 72 hours. Sepsis Labs: No results for input(s): PROCALCITON, LATICACIDVEN in the last 168 hours.  No results found for this or any previous visit (from the past 240 hour(s)).       Radiology Studies: Vas [place] Lower Extremity Venous (dvt)  Result Date: 04/25/2019  Lower Venous Study Indications: Edema.  Comparison Study: previous study done 11/15/18 Performing Technologist: Abram Sander RVS  Examination Guidelines: A complete evaluation includes B-mode imaging, spectral Doppler, color Doppler, and power Doppler as needed of all accessible portions of each vessel. Bilateral testing is considered an integral part of a complete examination. Limited examinations for reoccurring indications may be performed as noted.  +---------+---------------+---------+-----------+----------+--------------+ RIGHT    CompressibilityPhasicitySpontaneityPropertiesThrombus Aging +---------+---------------+---------+-----------+----------+--------------+ CFV      Full           Yes      Yes                                 +---------+---------------+---------+-----------+----------+--------------+  SFJ      Full                                                        +---------+---------------+---------+-----------+----------+--------------+ FV Prox  Full                                                        +---------+---------------+---------+-----------+----------+--------------+ FV Mid   Full                                                        +---------+---------------+---------+-----------+----------+--------------+ FV DistalFull                                                        +---------+---------------+---------+-----------+----------+--------------+ PFV      Full                                                        +---------+---------------+---------+-----------+----------+--------------+ POP      Full           Yes      Yes                                 +---------+---------------+---------+-----------+----------+--------------+ PTV      Full                                                        +---------+---------------+---------+-----------+----------+--------------+ PERO     Full                                                        +---------+---------------+---------+-----------+----------+--------------+   +----+---------------+---------+-----------+----------+--------------+ LEFTCompressibilityPhasicitySpontaneityPropertiesThrombus Aging +----+---------------+---------+-----------+----------+--------------+ CFV Full           Yes      Yes                                 +----+---------------+---------+-----------+----------+--------------+     Summary: Right: There is no evidence of deep vein thrombosis in the lower extremity. No cystic structure found in the popliteal fossa. Left: No evidence of common femoral vein obstruction.  *See table(s) above for measurements and observations.    Preliminary         Scheduled  Meds: . sodium chloride   Intravenous Once  . Chlorhexidine Gluconate Cloth  6 each  Topical Daily  . docusate sodium  100 mg Oral BID  . feeding supplement (PRO-STAT SUGAR FREE 64)  30 mL Oral BID  . Gerhardt's butt cream   Topical TID  . hydrocortisone  25 mg Rectal QHS  . mouth rinse  15 mL Mouth Rinse BID  . metoprolol tartrate  12.5 mg Oral BID  . miconazole nitrate   Topical BID  . multivitamin with minerals  1 tablet Oral Daily  . pantoprazole  40 mg Oral BID  . polyethylene glycol  17 g Oral Daily  . sodium chloride flush  10-40 mL Intracatheter Q12H   Continuous Infusions: . sodium chloride 250 mL (04/19/19 1224)     LOS: 26 days    Time spent: 33 minutes spent on chart review, discussion with nursing staff, consultants, updating family and interview/physical exam; more than 50% of that time was spent in counseling and/or coordination of care.    Eric J [place] DO [facility] Pager [phone]  If 7PM-7AM, please contact night-coverage [email or web address] Password [email or web address] 04/25/2019, 3:04 PM

## 2019-04-26 LAB — CBC
HCT: 23.6 % — ABNORMAL LOW (ref 39.0–52.0)
Hemoglobin: 7.3 g/dL — ABNORMAL LOW (ref 13.0–17.0)
MCH: 35.3 pg — ABNORMAL HIGH (ref 26.0–34.0)
MCHC: 30.9 g/dL (ref 30.0–36.0)
MCV: 114 fL — ABNORMAL HIGH (ref 80.0–100.0)
Platelets: UNDETERMINED 10*3/uL (ref 150–400)
RBC: 2.07 MIL/uL — ABNORMAL LOW (ref 4.22–5.81)
RDW: 16.7 % — ABNORMAL HIGH (ref 11.5–15.5)
WBC: 9 10*3/uL (ref 4.0–10.5)
nRBC: 0 % (ref 0.0–0.2)

## 2019-04-26 LAB — BASIC METABOLIC PANEL
Anion gap: 11 (ref 5–15)
BUN: 15 mg/dL (ref 6–20)
CO2: 19 mmol/L — ABNORMAL LOW (ref 22–32)
Calcium: 8 mg/dL — ABNORMAL LOW (ref 8.9–10.3)
Chloride: 104 mmol/L (ref 98–111)
Creatinine, Ser: 0.66 mg/dL (ref 0.61–1.24)
GFR calc Af Amer: >60 mL/min (ref >60)
GFR calc non Af Amer: >60 mL/min (ref >60)
Glucose, Bld: 106 mg/dL — ABNORMAL HIGH (ref 70–99)
Potassium: 3.6 mmol/L (ref 3.5–5.1)
Sodium: 134 mmol/L — ABNORMAL LOW (ref 135–145)

## 2019-04-26 LAB — MAGNESIUM: Magnesium: 1.4 mg/dL — ABNORMAL LOW (ref 1.7–2.4)

## 2019-04-26 MED ORDER — POLYETHYLENE GLYCOL 3350 17 G PO PACK
17.0000 g | PACK | Freq: Every day | ORAL | Status: DC | PRN
  Filled 2019-04-26: qty 1

## 2019-04-26 MED ORDER — CALCIUM CARBONATE ANTACID 500 MG PO CHEW
1.0000 | CHEWABLE_TABLET | Freq: Three times a day (TID) | ORAL | Status: DC | PRN
  Administered 2019-04-27 – 2019-05-24 (×20): 200 mg via ORAL
  Filled 2019-04-26 (×21): qty 1

## 2019-04-26 MED ORDER — MAGNESIUM SULFATE 2 GM/50ML IV SOLN
2.0000 g | INTRAVENOUS | Status: AC
  Administered 2019-04-26 (×2): 2 g via INTRAVENOUS
  Filled 2019-04-26 (×2): qty 50

## 2019-04-26 MED ORDER — HYDROCODONE-ACETAMINOPHEN 5-325 MG PO TABS
1.0000 | ORAL_TABLET | Freq: Four times a day (QID) | ORAL | Status: DC | PRN
  Administered 2019-04-26: 1 via ORAL
  Filled 2019-04-26: qty 1

## 2019-04-26 NOTE — Progress Notes (Signed)
  Speech Language Pathology Treatment: Dysphagia  Patient Details Name: Richard Calhoun MRN: [id number] DOB: 1961/03/17 Today's Date: 04/26/2019 Time: [id number] SLP Time Calculation (min) (ACUTE ONLY): 25 min  Assessment / Plan / Recommendation Clinical Impression  No indication of aspiration with thin liquid consumption.  Today mostly focused on initiating RMST to maximize laryngeal closure/phonatory strength for swallow protection with po intake.  Respironics PEP was clinically set at level 13 cm H20 pressure with pt requring mod cues to perform adequately initially.  As session progressed, pt demonstrated adequate completion but did admit he was "dizzy" by the end, therefore SLP lowered water pressure to 11 cm H20.  Advised he conduct this tonight and once tomorow.  Reviewed reasoning for its use and provided written instructions.  Please note, as session progressed, pt's voice became more dysphonic.  Will continue treatment. Pt states [name] is not going to purchase amplifier as she states pt's voice has improved.  HPI HPI: 58 y.o. year old male admitted 03/30/2019 with reports of worsening left flank pain, unwitnessed fall, nausea and vomiting for several days and episode of hematemesis, concern for upper GI bleed causing AKI. PMH: chronic alcohol abuse, CAD s/p DES (2018), CHFpEF, HLD. Pt intubated 9/13-18/2020.  Pt has been npo with likely secretion aspiration and has had an NG placed.  NG was removed over the weekend at it was coiled in his nose.MD notified SLP of need for pt to have an evaluation to determine if he can consume po intake due to length of npo, hospital stay.Pt now on a dys3/thin diet with good tolerance.  SLP has been addressing voice/respiratory strength and RMST to initiate.      SLP Plan  Continue with current plan of care       Recommendations  Liquids provided via: Straw;Cup Medication Administration: Whole meds with puree Supervision: Patient able to self  feed Compensations: Minimize environmental distractions;Slow rate;Small sips/bites Postural Changes and/or Swallow Maneuvers: Seated upright 90 degrees;Upright 30-60 min after meal                Oral Care Recommendations: Oral care QID;Staff/trained caregiver to provide oral care Follow up Recommendations: Skilled Nursing facility(for RMST) SLP Visit Diagnosis: Dysphagia, unspecified (R13.10) Plan: Continue with current plan of care       GO                Richard Calhoun 04/26/2019, 4:47 PM  Luanna Salk, [facility] SLP [place] Pager [phone] Office [phone]

## 2019-04-26 NOTE — Progress Notes (Signed)
PROGRESS NOTE    Richard Calhoun  [id number] DOB: 1960/11/27 DOA: 03/30/2019 PCP: Charlott Rakes, MD    Brief Narrative:   58 year old male who presented with hematemesis and abdominal pain. He does have significant past medical history for chronic alcohol abuse, chronic tobacco abuse, coronary artery disease status post angioplasty,chronic heart failure, dyslipidemia, peripheral vascular disease and paroxysmal tachycardia. He reported malaise and hematemesis.He had poor oral intake for last 3 days prior to hospitalization. He had worsening left flank pain, and unwitnessed fall, nausea and vomiting for several days. On his initial physical examination his blood pressure was 109/52, heart rate 78, oxygen saturation 96%, rotation, heart S1-S2 present rhythmic, his abdomen was soft, left-sided hematoma,no lower extremity edema.  Sodium 139, potassium 4.9, chloride 99, bicarb 13, glucose 86, BUN 72, creatinine 3.6, AST 183, ALT 73, total bilirubins 2.6,white count 8.9, hemoglobin 8.5, hematocrit 26.0, platelets 39.SARS COVID-19 was negative. CT abdomen with and no acute changes.  Patient was admitted to the hospital with a working diagnosis of hematemesis due to upper GI bleed.  Patient was medically treated with IV Ativan, IV thiamine, Librium and IV octreotide/IV pantoprazole. He developed worsening mentation, failed noninvasive mechanical ventilation and was intubated September 13.  On September 16 patient underwent upper endoscopy showing esophageal varices and portal hypertensive gastropathy. His mentation improved and he was liberated from mechanical ventilation on September 18.  After liberation from mechanical ventilation patient continued to be encephalopathic, showing withdrawal symptoms, he required dexmedetomidine infusion along with benzodiazepines per protocol. He developed new onset atrial fibrillation with rapid ventricular response, his electrolytes have been  abnormal,and enteral nutrition was started by tube feeds on September 21  Assessment & Plan:   Principal Problem:   Acute hyperactive alcohol withdrawal delirium ([place]) Active Problems:   Anxiety state   EtOH dependence ([place])   Hypertensive heart disease without heart failure   S/P drug eluting coronary stent placement   Upper GI bleed   Acute renal failure (HCC)   Alcoholic hepatitis without ascites   Encephalopathy, hepatic (HCC)   Encounter for central line placement   Acute hypernatremia   Metabolic acidosis   Thrombocytopenia (HCC)   Left arm swelling   Hypomagnesemia   Chronic diastolic CHF (congestive heart failure) (HCC)   Macrocytic anemia   Atrial fibrillation, rapid ([place])   Left lower lobe aspiration pneumonitis  Acute hypoxic respiratory failure: Resolved His diet has been advanced with good toleration.  --Currently oxygenating well on room air --Continue with aspiration precautions. --Continue speech therapy  Hypoactive delirium, likely of critical illness Hepatic encephalopathy Delirium/encephalopathy has resolved, but remains with weak voice with difficult communication. --Enteral nutrition now has been transitioned to dysphagia 3 diet with thin liquids by speech therapy --Continue to monitor mental status closely --Aspiration precautions as above  Paroxysmal atrial fibrillation with RVR/ 2018 preserved LV systolic function. --Continue rate control with metoprolol 12.5mg  PO BID --Not candidate for anticoagulation due to fall risk and upper GI bleed with EV  Alcoholic hepatitis, in the setting of hepatic steatosis/ cirrhosis, due to alcohol abuse. No signs of persistent hepatic encephalopathy. --Continue to hold on lactulose.  --Continue to encourage increased oral intake --Avoid hepatotoxins   Upper GI bleed with acute blood loss anemia Esophageal varices EGD 9/16 grade 1 varices distal esophagus, severe diffuse portal hypertensive  gastropathy, small nonbleeding erosions gastric antrum without stigmata of recent bleeding, small dispersed erosions without bleeding duodenal bulb.  H. pylori IgG antibodies within normal limits. --Hgb 8.0-->7.4-->7.6-->7.0 --s/p transfusion  1u pRBC on 9/21 and 9/30, s/p 1u FFP on 9/15 --Protonix 40 mg p.o. twice daily --Monitor CBC q3d  Pancreatic lesion Incidental finding of a 4.8 by 3.3 cm lesion with heterogeneous internal attenuation in the tail of the pancreas on the CT abdomen/pelvis on 03/30/2019.  MRCP on 04/20/2019 3.3 x 1.8 cm cystic lesion within the tail of the pancreas either representing a pseudocyst versus cystic neoplasm. --Will need repeat imaging of the pancreas with MRI/MRCP outpatient  AKI with metabolic acidosis/ hypokalemia and hypomagnesemianew hypernatremia.,  --Na 135 --encouraged increase oral intake --BMP q3d  Thrombocytopenia: stable with no signs of bleeding  Chronic diastolic heart failure/ coronary artery disease(2018 preserved LV systolic function). Edema likely secondary to low oncotic pressure secondary to hypoproteinemia. --Continue metoprolol for rate control  T2DM. Fasting glucose is 125 --continue to hold on insulin therapy for now, poor oral intake and risk of hypoglycemia.  Right foot pain/edema Patient complaining of acute onset right foot pain and edema, no history of trauma.  Suspect mild fluid accumulation in the setting of malnutrition.  Uric acid level 5.1, within normal limits.  Vascular ultrasound duplex venous scan right lower extremity negative for DVT. --Continue supportive measures --Encourage increased oral intake  Constipation Patient reports no bowel movement for 3 days, previously was having diarrhea. Resolved today with 4 reported bowel movements overnight. --MiraLAX p.o. daily prn --Colace 100 mg p.o. twice daily --Continue to monitor strict I's and O's  Moderate calorie protein malnutrition --nutrition  following, continue nutritional supplements.   Severe debility/deconditioning With significant debility and deconditioning from prolonged hospitalization and critical illness.  Patient is uninsured and will have a difficulty obtaining SNF placement.  Continues to work with PT/OT with findings of unsteady with standing and pivoting. --Continue PT/OT/ST while inpatient --If unable to obtain SNF placement, patient and significant other need to find alternative means given that he is unsafe for discharge at this time due to he will be alone for multiple hours a day and he has severe deconditioning. --Social work continues to work on options.   DVT prophylaxis: SCDs Code Status: Full code Family Communication: None present at bedside Disposition Plan: Continue inpatient, PT recommends SNF, unfortunate does not have insurance, Medicare application pending, social work for coordination   Consultants:   Gastroenterology: Signed off 04/10/2019  PCCM  Procedures:   EGD 04/04/2019  Antimicrobials:  none   Subjective: Patient seen and examined at bedside, resting comfortably.  Continues with significant weakness/debility.  Constipation resolved.  No other specific complaints this morning.  Denies headache, no fever/chills/night sweats, no chest pain, no palpitations, no abdominal pain.  No acute events overnight per nursing staff.  Objective: Vitals:   04/25/19 2054 04/26/19 0503 04/26/19 1024 04/26/19 1324  BP: 127/69 (!) 111/57 (!) 116/58 110/64  Pulse: (!) 103 88 (!) 104 83  Resp: 18 18    Temp: 98.8 F (37.1 C) 97.6 F (36.4 C)  98.2 F (36.8 C)  TempSrc: Oral Oral  Oral  SpO2: 98% 96%  100%  Weight:      Height:        Intake/Output Summary (Last 24 hours) at 04/26/2019 1437 Last data filed at 04/26/2019 1336 Gross per 24 hour  Intake 2135.07 ml  Output 3450 ml  Net -1314.93 ml   Filed Weights   04/14/19 1816 04/15/19 0414 04/16/19 0357  Weight: 85.6 kg 86.7 kg 85.6 kg     Examination:  General exam: Appears calm and comfortable, difficulty understanding secondary to his  voice Respiratory system: Coarse breath sounds bilaterally, slightly decreased bilateral bases, no wheezing/crackles, normal respiratory effort, on room air. Cardiovascular system: S1 & S2 heard, RRR. No JVD, murmurs, rubs, gallops or clicks. No pedal edema. Gastrointestinal system: Abdomen is nondistended, soft and nontender. No organomegaly or masses felt. Normal bowel sounds heard. Central nervous system: Alert and oriented. No focal neurological deficits. Extremities: Generalized muscle weakness with atrophy, moves all extremities independently Skin: No rashes, lesions or ulcers Psychiatry: Judgement and insight appear normal. Mood & affect appropriate.     Data Reviewed: I have personally reviewed following labs and imaging studies  CBC: Recent Labs  Lab 04/20/19 0336 04/20/19 1349 04/21/19 0423 04/23/19 0443 04/26/19 0355  WBC 7.9  --  7.0 7.3 9.0  HGB 7.4* 8.3* 7.6* 7.0* 7.3*  HCT 23.3* 26.7* 24.2* 22.5* 23.6*  MCV 116.5*  --  115.2* 113.6* 114.0*  PLT PLATELET CLUMPS NOTED ON SMEAR, COUNT APPEARS DECREASED  --  PLATELET CLUMPS NOTED ON SMEAR, COUNT APPEARS ADEQUATE PLATELET CLUMPS NOTED ON SMEAR, COUNT APPEARS DECREASED PLATELET CLUMPS NOTED ON SMEAR, UNABLE TO ESTIMATE   Basic Metabolic Panel: Recent Labs  Lab 04/20/19 0336 04/21/19 0423 04/23/19 0443 04/26/19 0355  NA 136 136 135 134*  K 4.3 4.2 3.5 3.6  CL 110 109 108 104  CO2 19* 20* 19* 19*  GLUCOSE 92 118* 125* 106*  BUN 18 19 14 15   CREATININE 0.84 0.84 0.82 0.66  CALCIUM 7.9* 8.0* 8.1* 8.0*  MG 1.9 1.6* 1.3* 1.4*   GFR: Estimated Creatinine Clearance: 101.4 mL/min (by C-G formula based on SCr of 0.66 mg/dL). Liver Function Tests: No results for input(s): AST, ALT, ALKPHOS, BILITOT, PROT, ALBUMIN in the last 168 hours. No results for input(s): LIPASE, AMYLASE in the last 168 hours. No results for  input(s): AMMONIA in the last 168 hours. Coagulation Profile: No results for input(s): INR, PROTIME in the last 168 hours. Cardiac Enzymes: No results for input(s): CKTOTAL, CKMB, CKMBINDEX, TROPONINI in the last 168 hours. BNP (last 3 results) No results for input(s): PROBNP in the last 8760 hours. HbA1C: No results for input(s): HGBA1C in the last 72 hours. CBG: No results for input(s): GLUCAP in the last 168 hours. Lipid Profile: No results for input(s): CHOL, HDL, LDLCALC, TRIG, CHOLHDL, LDLDIRECT in the last 72 hours. Thyroid Function Tests: No results for input(s): TSH, T4TOTAL, FREET4, T3FREE, THYROIDAB in the last 72 hours. Anemia Panel: No results for input(s): VITAMINB12, FOLATE, FERRITIN, TIBC, IRON, RETICCTPCT in the last 72 hours. Sepsis Labs: No results for input(s): PROCALCITON, LATICACIDVEN in the last 168 hours.  No results found for this or any previous visit (from the past 240 hour(s)).       Radiology Studies: Vas [place] Lower Extremity Venous (dvt)  Result Date: 04/25/2019  Lower Venous Study Indications: Edema.  Comparison Study: previous study done 11/15/18 Performing Technologist: Abram Sander RVS  Examination Guidelines: A complete evaluation includes B-mode imaging, spectral Doppler, color Doppler, and power Doppler as needed of all accessible portions of each vessel. Bilateral testing is considered an integral part of a complete examination. Limited examinations for reoccurring indications may be performed as noted.  +---------+---------------+---------+-----------+----------+--------------+ RIGHT    CompressibilityPhasicitySpontaneityPropertiesThrombus Aging +---------+---------------+---------+-----------+----------+--------------+ CFV      Full           Yes      Yes                                 +---------+---------------+---------+-----------+----------+--------------+  SFJ      Full                                                         +---------+---------------+---------+-----------+----------+--------------+ FV Prox  Full                                                        +---------+---------------+---------+-----------+----------+--------------+ FV Mid   Full                                                        +---------+---------------+---------+-----------+----------+--------------+ FV DistalFull                                                        +---------+---------------+---------+-----------+----------+--------------+ PFV      Full                                                        +---------+---------------+---------+-----------+----------+--------------+ POP      Full           Yes      Yes                                 +---------+---------------+---------+-----------+----------+--------------+ PTV      Full                                                        +---------+---------------+---------+-----------+----------+--------------+ PERO     Full                                                        +---------+---------------+---------+-----------+----------+--------------+   +----+---------------+---------+-----------+----------+--------------+ LEFTCompressibilityPhasicitySpontaneityPropertiesThrombus Aging +----+---------------+---------+-----------+----------+--------------+ CFV Full           Yes      Yes                                 +----+---------------+---------+-----------+----------+--------------+     Summary: Right: There is no evidence of deep vein thrombosis in the lower extremity. No cystic structure found in the popliteal fossa. Left: No evidence of common femoral vein obstruction.  *See table(s) above for measurements and observations. Electronically signed by Curt Jews MD on 04/25/2019 at 4:39:00 PM.  Final         Scheduled Meds: . sodium chloride   Intravenous Once  . Chlorhexidine Gluconate Cloth  6 each Topical Daily   . docusate sodium  100 mg Oral BID  . feeding supplement (PRO-STAT SUGAR FREE 64)  30 mL Oral BID  . Gerhardt's butt cream   Topical TID  . hydrocortisone  25 mg Rectal QHS  . mouth rinse  15 mL Mouth Rinse BID  . metoprolol tartrate  12.5 mg Oral BID  . miconazole nitrate   Topical BID  . multivitamin with minerals  1 tablet Oral Daily  . pantoprazole  40 mg Oral BID  . sodium chloride flush  10-40 mL Intracatheter Q12H   Continuous Infusions: . sodium chloride 250 mL (04/19/19 1224)     LOS: 27 days    Time spent: 33 minutes spent on chart review, discussion with nursing staff, consultants, updating family and interview/physical exam; more than 50% of that time was spent in counseling and/or coordination of care.    Antino Mayabb J [place] DO [facility] Pager [phone]  If 7PM-7AM, please contact night-coverage [email or web address] Password [facility] 04/26/2019, 2:37 PM

## 2019-04-27 MED ORDER — TRAMADOL HCL 50 MG PO TABS
50.0000 mg | ORAL_TABLET | Freq: Four times a day (QID) | ORAL | Status: DC | PRN
  Administered 2019-04-27 – 2019-05-05 (×5): 50 mg via ORAL
  Filled 2019-04-27 (×5): qty 1

## 2019-04-27 MED ORDER — FUROSEMIDE 10 MG/ML IJ SOLN
40.0000 mg | Freq: Once | INTRAMUSCULAR | Status: AC
  Administered 2019-04-27: 40 mg via INTRAVENOUS
  Filled 2019-04-27: qty 4

## 2019-04-27 NOTE — Progress Notes (Signed)
PROGRESS NOTE    Richard Calhoun  [id number] DOB: 29-Jul-1960 DOA: 03/30/2019 PCP: Charlott Rakes, MD    Brief Narrative:   58 year old male who presented with hematemesis and abdominal pain. He does have significant past medical history for chronic alcohol abuse, chronic tobacco abuse, coronary artery disease status post angioplasty,chronic heart failure, dyslipidemia, peripheral vascular disease and paroxysmal tachycardia. He reported malaise and hematemesis.He had poor oral intake for last 3 days prior to hospitalization. He had worsening left flank pain, and unwitnessed fall, nausea and vomiting for several days. On his initial physical examination his blood pressure was 109/52, heart rate 78, oxygen saturation 96%, rotation, heart S1-S2 present rhythmic, his abdomen was soft, left-sided hematoma,no lower extremity edema.  Sodium 139, potassium 4.9, chloride 99, bicarb 13, glucose 86, BUN 72, creatinine 3.6, AST 183, ALT 73, total bilirubins 2.6,white count 8.9, hemoglobin 8.5, hematocrit 26.0, platelets 39.SARS COVID-19 was negative. CT abdomen with and no acute changes.  Patient was admitted to the hospital with a working diagnosis of hematemesis due to upper GI bleed.  Patient was medically treated with IV Ativan, IV thiamine, Librium and IV octreotide/IV pantoprazole. He developed worsening mentation, failed noninvasive mechanical ventilation and was intubated September 13.  On September 16 patient underwent upper endoscopy showing esophageal varices and portal hypertensive gastropathy. His mentation improved and he was liberated from mechanical ventilation on September 18.  After liberation from mechanical ventilation patient continued to be encephalopathic, showing withdrawal symptoms, he required dexmedetomidine infusion along with benzodiazepines per protocol. He developed new onset atrial fibrillation with rapid ventricular response, his electrolytes have been  abnormal,and enteral nutrition was started by tube feeds on September 21  Assessment & Plan:   Principal Problem:   Acute hyperactive alcohol withdrawal delirium ([place]) Active Problems:   Anxiety state   EtOH dependence ([place])   Hypertensive heart disease without heart failure   S/P drug eluting coronary stent placement   Upper GI bleed   Acute renal failure (HCC)   Alcoholic hepatitis without ascites   Encephalopathy, hepatic (HCC)   Encounter for central line placement   Acute hypernatremia   Metabolic acidosis   Thrombocytopenia (HCC)   Left arm swelling   Hypomagnesemia   Chronic diastolic CHF (congestive heart failure) (HCC)   Macrocytic anemia   Atrial fibrillation, rapid ([place])   Left lower lobe aspiration pneumonitis  Acute hypoxic respiratory failure: Resolved His diet has been advanced with good toleration.  --Currently oxygenating well on room air --Continue with aspiration precautions. --Continue speech therapy  Hypoactive delirium, likely of critical illness Hepatic encephalopathy Delirium/encephalopathy has resolved, but remains with weak voice with difficult communication. --Enteral nutrition now has been transitioned to dysphagia 3 diet with thin liquids by speech therapy --Continue to monitor mental status closely --Aspiration precautions as above  Paroxysmal atrial fibrillation with RVR/ 2018 preserved LV systolic function. --Continue rate control with metoprolol 12.5mg  PO BID --Not candidate for anticoagulation due to fall risk and upper GI bleed with EV  Alcoholic hepatitis, in the setting of hepatic steatosis/ cirrhosis, due to alcohol abuse. No signs of persistent hepatic encephalopathy. --Continue to hold on lactulose.  --Continue to encourage increased oral intake --Avoid hepatotoxins   Upper GI bleed with acute blood loss anemia Esophageal varices EGD 9/16 grade 1 varices distal esophagus, severe diffuse portal hypertensive  gastropathy, small nonbleeding erosions gastric antrum without stigmata of recent bleeding, small dispersed erosions without bleeding duodenal bulb.  H. pylori IgG antibodies within normal limits. --Hgb 8.0-->7.4-->7.6-->7.0-->7.3 --s/p transfusion  1u pRBC on 9/21 and 9/30, s/p 1u FFP on 9/15 --Protonix 40 mg p.o. twice daily --Monitor CBC q3d  Pancreatic lesion Incidental finding of a 4.8 by 3.3 cm lesion with heterogeneous internal attenuation in the tail of the pancreas on the CT abdomen/pelvis on 03/30/2019.  MRCP on 04/20/2019 3.3 x 1.8 cm cystic lesion within the tail of the pancreas either representing a pseudocyst versus cystic neoplasm. --Will need repeat imaging of the pancreas with MRI/MRCP outpatient  AKI with metabolic acidosis/ hypokalemia / hypomagnesemia/ hypernatremia --Na 134 --encouraged increase oral intake --BMP q3d  Thrombocytopenia: stable with no signs of bleeding  Chronic diastolic heart failure/ coronary artery disease(2018 preserved LV systolic function). Edema likely secondary to low oncotic pressure secondary to hypoproteinemia. --Continue metoprolol for rate control  T2DM. Fasting glucose is 106 --continue to hold on insulin therapy for now, poor oral intake and risk of hypoglycemia.  Right foot pain/edema Patient complaining of acute onset right foot pain and edema, no history of trauma.  Suspect mild fluid accumulation in the setting of malnutrition.  Uric acid level 5.1, within normal limits.  Vascular ultrasound duplex venous scan right lower extremity negative for DVT. --TED hose --Furosemide 40 mg IV x1 today --Continue supportive measures --Encourage increased oral intake  Constipation Patient reports no bowel movement for 3 days, previously was having diarrhea. Resolved today with 4 reported bowel movements overnight. --MiraLAX p.o. daily prn --Colace 100 mg p.o. twice daily --Continue to monitor strict I's and O's  Moderate calorie  protein malnutrition --nutrition following, continue nutritional supplements.   Severe debility/deconditioning With significant debility and deconditioning from prolonged hospitalization and critical illness.  Patient is uninsured and will have a difficulty obtaining SNF placement.  Continues to work with PT/OT with findings of unsteady with standing and pivoting. --Continue PT/OT/ST while inpatient --If unable to obtain SNF placement, patient and significant other need to find alternative means given that he is unsafe for discharge at this time due to he will be alone for multiple hours a day and he has severe deconditioning. --Social work continues to work on options.   DVT prophylaxis: SCDs Code Status: Full code Family Communication: None present at bedside Disposition Plan: Continue inpatient, PT recommends SNF, unfortunate does not have insurance, Medicare application pending, social work for coordination   Consultants:   Gastroenterology: Signed off 04/10/2019  PCCM  Procedures:   EGD 04/04/2019  Antimicrobials:  none   Subjective: Patient seen and examined at bedside, resting comfortably.  Continues with significant weakness/debility.  Complains of bilateral lower extremity edema.  Continues to complain of slow progress through the hospitalization.  Complains that he is unable to manage his condom catheter or use the urinal.  No other specific complaints this morning.  Denies headache, no fever/chills/night sweats, no chest pain, no palpitations, no abdominal pain.  No acute events overnight per nursing staff.  Objective: Vitals:   04/26/19 1720 04/26/19 2208 04/27/19 0538 04/27/19 1022  BP: 130/69 117/63 (!) 101/59 104/61  Pulse: (!) 102 95 91 (!) 110  Resp:  16 16   Temp: 97.8 F (36.6 C) 98.6 F (37 C) 98.4 F (36.9 C)   TempSrc: Oral Oral Oral   SpO2: 100% 100% 98%   Weight:      Height:        Intake/Output Summary (Last 24 hours) at 04/27/2019 1358 Last  data filed at 04/27/2019 1357 Gross per 24 hour  Intake 1155 ml  Output 2800 ml  Net -1645 ml  Filed Weights   04/14/19 1816 04/15/19 0414 04/16/19 0357  Weight: 85.6 kg 86.7 kg 85.6 kg    Examination:  General exam: Appears calm and comfortable, difficulty understanding secondary to his voice Respiratory system: Coarse breath sounds bilaterally, slightly decreased bilateral bases, no wheezing/crackles, normal respiratory effort, on room air. Cardiovascular system: S1 & S2 heard, RRR. No JVD, murmurs, rubs, gallops or clicks. No pedal edema. Gastrointestinal system: Abdomen is nondistended, soft and nontender. No organomegaly or masses felt. Normal bowel sounds heard. Central nervous system: Alert and oriented. No focal neurological deficits. Extremities: Generalized muscle weakness with atrophy, moves all extremities independently Skin: No rashes, lesions or ulcers Psychiatry: Judgement and insight appear normal. Mood & affect appropriate.     Data Reviewed: I have personally reviewed following labs and imaging studies  CBC: Recent Labs  Lab 04/21/19 0423 04/23/19 0443 04/26/19 0355  WBC 7.0 7.3 9.0  HGB 7.6* 7.0* 7.3*  HCT 24.2* 22.5* 23.6*  MCV 115.2* 113.6* 114.0*  PLT PLATELET CLUMPS NOTED ON SMEAR, COUNT APPEARS ADEQUATE PLATELET CLUMPS NOTED ON SMEAR, COUNT APPEARS DECREASED PLATELET CLUMPS NOTED ON SMEAR, UNABLE TO ESTIMATE   Basic Metabolic Panel: Recent Labs  Lab 04/21/19 0423 04/23/19 0443 04/26/19 0355  NA 136 135 134*  K 4.2 3.5 3.6  CL 109 108 104  CO2 20* 19* 19*  GLUCOSE 118* 125* 106*  BUN 19 14 15   CREATININE 0.84 0.82 0.66  CALCIUM 8.0* 8.1* 8.0*  MG 1.6* 1.3* 1.4*   GFR: Estimated Creatinine Clearance: 101.4 mL/min (by C-G formula based on SCr of 0.66 mg/dL). Liver Function Tests: No results for input(s): AST, ALT, ALKPHOS, BILITOT, PROT, ALBUMIN in the last 168 hours. No results for input(s): LIPASE, AMYLASE in the last 168 hours. No  results for input(s): AMMONIA in the last 168 hours. Coagulation Profile: No results for input(s): INR, PROTIME in the last 168 hours. Cardiac Enzymes: No results for input(s): CKTOTAL, CKMB, CKMBINDEX, TROPONINI in the last 168 hours. BNP (last 3 results) No results for input(s): PROBNP in the last 8760 hours. HbA1C: No results for input(s): HGBA1C in the last 72 hours. CBG: No results for input(s): GLUCAP in the last 168 hours. Lipid Profile: No results for input(s): CHOL, HDL, LDLCALC, TRIG, CHOLHDL, LDLDIRECT in the last 72 hours. Thyroid Function Tests: No results for input(s): TSH, T4TOTAL, FREET4, T3FREE, THYROIDAB in the last 72 hours. Anemia Panel: No results for input(s): VITAMINB12, FOLATE, FERRITIN, TIBC, IRON, RETICCTPCT in the last 72 hours. Sepsis Labs: No results for input(s): PROCALCITON, LATICACIDVEN in the last 168 hours.  No results found for this or any previous visit (from the past 240 hour(s)).       Radiology Studies: No results found.      Scheduled Meds: . sodium chloride   Intravenous Once  . Chlorhexidine Gluconate Cloth  6 each Topical Daily  . docusate sodium  100 mg Oral BID  . feeding supplement (PRO-STAT SUGAR FREE 64)  30 mL Oral BID  . Gerhardt's butt cream   Topical TID  . hydrocortisone  25 mg Rectal QHS  . mouth rinse  15 mL Mouth Rinse BID  . metoprolol tartrate  12.5 mg Oral BID  . miconazole nitrate   Topical BID  . multivitamin with minerals  1 tablet Oral Daily  . pantoprazole  40 mg Oral BID  . sodium chloride flush  10-40 mL Intracatheter Q12H   Continuous Infusions: . sodium chloride 250 mL (04/19/19 1224)  LOS: 28 days    Time spent: 33 minutes spent on chart review, discussion with nursing staff, consultants, updating family and interview/physical exam; more than 50% of that time was spent in counseling and/or coordination of care.    Raelene Trew J [place] DO [facility] Pager [phone]  If 7PM-7AM,  please contact night-coverage [email or web address] Password [email or web address] 04/27/2019, 1:58 PM

## 2019-04-27 NOTE — Progress Notes (Signed)
Physical Therapy Treatment Patient Details Name: Richard Calhoun MRN: [id number] DOB: 1960-12-17 Today's Date: 04/27/2019    History of Present Illness 58 yo male admitted to ED on 9/10 for R flank pain, ETOH, N/V. Pt requiring intubation 9/13-9/18.Endoscopy reveals nonbleeding erosive gastropathy, duodenal erosions; placed feeding tube. Pt with new diagnosis LLL aspiration pneumonitis, hypoactive delirium. PMH includes anxiety, alcohol abuse, depression, GERD, HTN, HLD, tubular adenoma of colon, CAD with stent placement, PVD.    PT Comments    Pt with less buckeling today in standing.  Used Denna Haggard for standing tolerance and transfer to the recliner.  Pt c/o new R foot/ankle pain (9/10) since last visit. Nurse is aware and states MD is aware as well. Pt continues to be weak and at times unsure if he fully understands how de-conditioned he is. Pt's voice a bit louder and stronger sounding today. Recommend OT assessment. Con't to recommend SNF.     Follow Up Recommendations  SNF;Supervision/Assistance - 24 hour     Equipment Recommendations  None recommended by PT    Recommendations for Other Services       Precautions / Restrictions Precautions Precautions: Fall Precaution Comments: CIWA, very softspoken Restrictions Weight Bearing Restrictions: No    Mobility  Bed Mobility Overal bed mobility: Needs Assistance Bed Mobility: Supine to Sit     Supine to sit: Min assist;+2 for safety/equipment     General bed mobility comments: Pt able to initiate transfer and get legs off of bed and turn with rail, but needed A to get trunk fully upright.  Transfers Overall transfer level: Needs assistance Equipment used: Ambulation equipment used Transfers: Sit to/from [facility] Sit to Stand: Min assist;+2 physical assistance Stand pivot transfers: Total assist       General transfer comment: Due to significant buckeling last session, used the [place] today. Pt  stood from bed with MIN A of 2 with pulling on bar.  Stood from [employer] with standing tolerance of 30 -90 seconds with cues for posture. Nurse able to change pad and apply cream to buttocks while standing.   Ambulation/Gait                 Stairs             Wheelchair Mobility    Modified Rankin (Stroke Patients Only)       Balance   Sitting-balance support: Feet supported Sitting balance-Leahy Scale: Fair     Standing balance support: Bilateral upper extremity supported Standing balance-Leahy Scale: Poor Standing balance comment: Decreased buckeling this session                            Cognition Arousal/Alertness: Awake/alert Behavior During Therapy: Flat affect Overall Cognitive Status: Within Functional Limits for tasks assessed                                 General Comments: Pt's voice a bit louder/stronger today.      Exercises      General Comments General comments (skin integrity, edema, etc.): Pt c/o R foot pain which started since last session.  He is able to do ankle pump, but c/o pain. Nursing attended to wound while in standing      Pertinent Vitals/Pain Pain Assessment: 0-10 Pain Score: 9  Pain Location: Top of R foot Pain Descriptors / Indicators: Grimacing;Sore Pain Intervention(s): Monitored  during session;Limited activity within patient's tolerance;Repositioned    Home Living                      Prior Function            PT Goals (current goals can now be found in the care plan section) Acute Rehab PT Goals PT Goal Formulation: With patient Time For Goal Achievement: 05/07/19 Potential to Achieve Goals: Fair Progress towards PT goals: Progressing toward goals;Goals met and updated - see care plan    Frequency    Min 2X/week      PT Plan Current plan remains appropriate    Co-evaluation              AM-PAC PT "6 Clicks" Mobility   Outcome Measure  Help needed  turning from your back to your side while in a flat bed without using bedrails?: A Little Help needed moving from lying on your back to sitting on the side of a flat bed without using bedrails?: A Little Help needed moving to and from a bed to a chair (including a wheelchair)?: Total Help needed standing up from a chair using your arms (e.g., wheelchair or bedside chair)?: A Lot Help needed to walk in hospital room?: Total Help needed climbing 3-5 steps with a railing? : Total 6 Click Score: 11    End of Session Equipment Utilized During Treatment: Gait belt Activity Tolerance: Patient limited by pain Patient left: in chair;with call bell/phone within reach;with chair alarm set Nurse Communication: Need for lift equipment;Mobility status PT Visit Diagnosis: Muscle weakness (generalized) (M62.81);Other abnormalities of gait and mobility (R26.89)     Time: 8250-5397 PT Time Calculation (min) (ACUTE ONLY): 23 min  Charges:  $Therapeutic Activity: 23-37 mins                     Bana Borgmeyer L. Tamala Julian, [place] Pager [phone] 04/27/2019    Galen [place] 04/27/2019, 11:14 AM

## 2019-04-28 LAB — BASIC METABOLIC PANEL
Anion gap: 9 (ref 5–15)
BUN: 16 mg/dL (ref 6–20)
CO2: 19 mmol/L — ABNORMAL LOW (ref 22–32)
Calcium: 8 mg/dL — ABNORMAL LOW (ref 8.9–10.3)
Chloride: 104 mmol/L (ref 98–111)
Creatinine, Ser: 0.7 mg/dL (ref 0.61–1.24)
GFR calc Af Amer: >60 mL/min (ref >60)
GFR calc non Af Amer: >60 mL/min (ref >60)
Glucose, Bld: 160 mg/dL — ABNORMAL HIGH (ref 70–99)
Potassium: 3.4 mmol/L — ABNORMAL LOW (ref 3.5–5.1)
Sodium: 132 mmol/L — ABNORMAL LOW (ref 135–145)

## 2019-04-28 LAB — MAGNESIUM: Magnesium: 1.5 mg/dL — ABNORMAL LOW (ref 1.7–2.4)

## 2019-04-28 LAB — CBC
HCT: 24 % — ABNORMAL LOW (ref 39.0–52.0)
Hemoglobin: 7.4 g/dL — ABNORMAL LOW (ref 13.0–17.0)
MCH: 35.1 pg — ABNORMAL HIGH (ref 26.0–34.0)
MCHC: 30.8 g/dL (ref 30.0–36.0)
MCV: 113.7 fL — ABNORMAL HIGH (ref 80.0–100.0)
Platelets: 94 10*3/uL — ABNORMAL LOW (ref 150–400)
RBC: 2.11 MIL/uL — ABNORMAL LOW (ref 4.22–5.81)
RDW: 16.5 % — ABNORMAL HIGH (ref 11.5–15.5)
WBC: 9.7 10*3/uL (ref 4.0–10.5)
nRBC: 0 % (ref 0.0–0.2)

## 2019-04-28 MED ORDER — STERILE WATER FOR INJECTION IV SOLN
INTRAVENOUS | Status: AC
  Administered 2019-04-28: 16:00:00 via INTRAVENOUS
  Filled 2019-04-28 (×2): qty 850

## 2019-04-28 MED ORDER — POTASSIUM CHLORIDE CRYS ER 20 MEQ PO TBCR
40.0000 meq | EXTENDED_RELEASE_TABLET | Freq: Once | ORAL | Status: AC
  Administered 2019-04-28: 40 meq via ORAL
  Filled 2019-04-28: qty 2

## 2019-04-28 MED ORDER — MAGNESIUM SULFATE 4 GM/100ML IV SOLN
4.0000 g | Freq: Once | INTRAVENOUS | Status: AC
  Administered 2019-04-28: 4 g via INTRAVENOUS
  Filled 2019-04-28: qty 100

## 2019-04-28 NOTE — Progress Notes (Signed)
PROGRESS NOTE  Richard Calhoun [id number] DOB: Nov 21, 1960 DOA: 03/30/2019 PCP: Charlott Rakes, MD  HPI/Recap of past 87 hours:  58 year old male who presented with hematemesis and abdominal pain. He does have significant past medical history for chronic alcohol abuse, chronic tobacco abuse, coronary artery disease status post angioplasty,chronic heart failure, dyslipidemia, peripheral vascular disease and paroxysmal tachycardia. He reported malaise and hematemesis.He had poor oral intake for last 3 days prior to hospitalization. He had worsening left flank pain, and unwitnessed fall, nausea and vomiting for several days. On his initial physical examination his blood pressure was 109/52, heart rate 78, oxygen saturation 96%, rotation, heart S1-S2 present rhythmic, his abdomen was soft, left-sided hematoma,no lower extremity edema.  Sodium 139, potassium 4.9, chloride 99, bicarb 13, glucose 86, BUN 72, creatinine 3.6, AST 183, ALT 73, total bilirubins 2.6,white count 8.9, hemoglobin 8.5, hematocrit 26.0, platelets 39.SARS COVID-19 was negative. CT abdomen with and no acute changes.  Patient was admitted to the hospital with a working diagnosis of hematemesis due to upper GI bleed.  Patient was medically treated with IV Ativan, IV thiamine, Librium and IV octreotide/IV pantoprazole. He developed worsening mentation, failed noninvasive mechanical ventilation and was intubated September 13.  On September 16 patient underwent upper endoscopy showing esophageal varices and portal hypertensive gastropathy. His mentation improved and he was liberated from mechanical ventilation on September 18.  After liberation from mechanical ventilation patient continued to be encephalopathic, showing withdrawal symptoms, he required dexmedetomidine infusion along with benzodiazepines per protocol. He developed new onset atrial fibrillation with rapid ventricular response, his electrolytes have been  abnormal,and enteral nutrition was started by tube feeds on September 21.  04/28/19: Patient was seen and examined at his bedside this morning.  No acute events overnight.  Hypokalemia and hypomagnesemia noted on lab results this morning.  Bowel movements alternate between diarrhea and constipation which is chronic.  Assessment/Plan: Principal Problem:   Acute hyperactive alcohol withdrawal delirium (HCC) Active Problems:   Anxiety state   EtOH dependence ([place])   Hypertensive heart disease without heart failure   S/P drug eluting coronary stent placement   Upper GI bleed   Acute renal failure (HCC)   Alcoholic hepatitis without ascites   Encephalopathy, hepatic (HCC)   Encounter for central line placement   Acute hypernatremia   Metabolic acidosis   Thrombocytopenia (HCC)   Left arm swelling   Hypomagnesemia   Chronic diastolic CHF (congestive heart failure) (HCC)   Macrocytic anemia   Atrial fibrillation, rapid (HCC)   Acute hypoxic respiratory failure likely secondary to left lower lobe aspiration pneumonitis, resolved O2 saturation 100% on room air  Non-anion gap metabolic acidosis Chemistry bicarb 19 with anion gap of 9 Start isotonic bicarb at 50 cc/h x 1 day  Hypovolemic hyponatremia Sodium 132 IV fluid as stated above Continue to monitor volume status and urine output  Macrocytic anemia No sign of active bleeding Hemoglobin stable at 7.4 with MCV of 113 Vitamin B12 and folate within normal range  Suspected IBS Self-reported chronic diarrhea alternating with constipation Appears stable at this time Diarrhea likely contributing to hypokalemia and hypomagnesemia  Hypokalemia Potassium 3.4 Repleted with KCl p.o. 40 mEq once  Hypomagnesemia Magnesium 1.5 repleted with IV magnesium 4 g once  Resolved hepatic encephalopathy  Paroxysmal atrial fibrillation with RVR/ 2018 preserved LV systolic function. --Continue rate control withmetoprolol 12.5mg  PO BID  --Not candidate for anticoagulation due to fall risk and upper GI bleed with EV  Alcoholic hepatitis, in the setting  of hepatic steatosis/ cirrhosis, due to alcohol abuse. No signs of persistent hepatic encephalopathy. --Continue to hold on lactulose.  --Continue to encourage increased oral intake --Avoid hepatotoxins   Upper GI bleed with acute blood loss anemia Esophageal varices EGD 9/16 grade 1 varices distal esophagus, severe diffuse portal hypertensive gastropathy, small nonbleeding erosions gastric antrum without stigmata of recent bleeding, small dispersed erosions without bleeding duodenal bulb.  H. pylori IgG antibodies within normal limits. --Hgb 8.0-->7.4-->7.6-->7.0-->7.3 --s/p transfusion 1u pRBC on 9/21 and 9/30, s/p 1u FFP on 9/15 --Protonix 40 mg p.o. twice daily --Monitor CBC q3d  Pancreatic lesion Incidental finding of a 4.8 by 3.3 cm lesion with heterogeneous internal attenuation in the tail of the pancreas on the CT abdomen/pelvis on 03/30/2019.  MRCP on 04/20/2019 3.3 x 1.8 cm cystic lesion within the tail of the pancreas either representing a pseudocyst versus cystic neoplasm. --Will need repeat imaging of the pancreas with MRI/MRCP outpatient  Thrombocytopenia: stable with no signs of bleeding  Chronic diastolic heart failure/ coronary artery disease(2018 preserved LV systolic function). Edema likely secondary to low oncotic pressure secondary to hypoproteinemia. --Continue metoprolol for rate control  T2DM. Fasting glucose is106 --continue to hold on insulin therapy for now, poor oral intake and risk of hypoglycemia.  Improving right foot pain/edema Patient complaining of acute onset right foot pain and edema, no history of trauma.  Suspect mild fluid accumulation in the setting of malnutrition.  Uric acid level 5.1, within normal limits.  Vascular ultrasound duplex venous scan right lower extremity negative for DVT. --TED hose, continue   Moderate calorie protein malnutrition --nutrition following, continue nutritional supplements.  Severe debility/deconditioning With significant debility and deconditioning from prolonged hospitalization and critical illness.  Patient is uninsured and will have a difficulty obtaining SNF placement.  Continues to work with PT/OT with findings of unsteady with standing and pivoting. --Continue PT/OT/ST while inpatient --If unable to obtain SNF placement, patient and significant other need to find alternative means given that he is unsafe for discharge at this time due to he will be alone for multiple hours a day and he has severe deconditioning. --Social work continues to work on options.   DVT prophylaxis: SCDs Code Status: Full code Family Communication: None present at bedside Disposition Plan: Continue inpatient, PT recommends SNF, unfortunate does not have insurance, Medicare application pending, social work for coordination   Consultants:   Gastroenterology: Signed off 04/10/2019  PCCM  Procedures:   EGD 04/04/2019  Antimicrobials:  none     Objective: Vitals:   04/27/19 1358 04/27/19 2108 04/28/19 0622 04/28/19 1347  BP: 112/66 110/63 113/68 (!) 103/56  Pulse: 98 94 90 86  Resp: 16 20 20 18   Temp: 98.7 F (37.1 C) 98.4 F (36.9 C) 98.5 F (36.9 C) 98.7 F (37.1 C)  TempSrc: Oral Oral Oral Oral  SpO2: 99% 98% 99% 100%  Weight:      Height:        Intake/Output Summary (Last 24 hours) at 04/28/2019 1402 Last data filed at 04/28/2019 1000 Gross per 24 hour  Intake 400 ml  Output 2350 ml  Net -1950 ml   Filed Weights   04/14/19 1816 04/15/19 0414 04/16/19 0357  Weight: 85.6 kg 86.7 kg 85.6 kg    Exam:  . General: 58 y.o. year-old male well developed well nourished in no acute distress.  Alert and oriented x3. . Cardiovascular: Regular rate and rhythm with no rubs or gallops.  No thyromegaly or JVD noted.   Marland Kitchen  Respiratory: Clear to auscultation  with no wheezes or rales. Good inspiratory effort. . Abdomen: Soft nontender nondistended with normal bowel sounds x4 quadrants. . Musculoskeletal: Trace lower extremity edema. 2/4 pulses in all 4 extremities. Marland Kitchen Psychiatry: Mood is appropriate for condition and setting   Data Reviewed: CBC: Recent Labs  Lab 04/23/19 0443 04/26/19 0355 04/28/19 0916  WBC 7.3 9.0 9.7  HGB 7.0* 7.3* 7.4*  HCT 22.5* 23.6* 24.0*  MCV 113.6* 114.0* 113.7*  PLT PLATELET CLUMPS NOTED ON SMEAR, COUNT APPEARS DECREASED PLATELET CLUMPS NOTED ON SMEAR, UNABLE TO ESTIMATE 94*   Basic Metabolic Panel: Recent Labs  Lab 04/23/19 0443 04/26/19 0355 04/28/19 0916  NA 135 134* 132*  K 3.5 3.6 3.4*  CL 108 104 104  CO2 19* 19* 19*  GLUCOSE 125* 106* 160*  BUN 14 15 16   CREATININE 0.82 0.66 0.70  CALCIUM 8.1* 8.0* 8.0*  MG 1.3* 1.4* 1.5*   GFR: Estimated Creatinine Clearance: 101.4 mL/min (by C-G formula based on SCr of 0.7 mg/dL). Liver Function Tests: No results for input(s): AST, ALT, ALKPHOS, BILITOT, PROT, ALBUMIN in the last 168 hours. No results for input(s): LIPASE, AMYLASE in the last 168 hours. No results for input(s): AMMONIA in the last 168 hours. Coagulation Profile: No results for input(s): INR, PROTIME in the last 168 hours. Cardiac Enzymes: No results for input(s): CKTOTAL, CKMB, CKMBINDEX, TROPONINI in the last 168 hours. BNP (last 3 results) No results for input(s): PROBNP in the last 8760 hours. HbA1C: No results for input(s): HGBA1C in the last 72 hours. CBG: No results for input(s): GLUCAP in the last 168 hours. Lipid Profile: No results for input(s): CHOL, HDL, LDLCALC, TRIG, CHOLHDL, LDLDIRECT in the last 72 hours. Thyroid Function Tests: No results for input(s): TSH, T4TOTAL, FREET4, T3FREE, THYROIDAB in the last 72 hours. Anemia Panel: No results for input(s): VITAMINB12, FOLATE, FERRITIN, TIBC, IRON, RETICCTPCT in the last 72 hours. Urine analysis:    Component Value  Date/Time   COLORURINE AMBER (A) 04/01/2019 1336   APPEARANCEUR CLEAR 04/01/2019 1336   LABSPEC 1.011 04/01/2019 1336   PHURINE 6.0 04/01/2019 1336   GLUCOSEU NEGATIVE 04/01/2019 1336   GLUCOSEU NEGATIVE 08/29/2012 1520   HGBUR NEGATIVE 04/01/2019 1336   BILIRUBINUR NEGATIVE 04/01/2019 1336   KETONESUR 20 (A) 04/01/2019 1336   PROTEINUR NEGATIVE 04/01/2019 1336   UROBILINOGEN 0.2 08/29/2012 1520   NITRITE NEGATIVE 04/01/2019 1336   LEUKOCYTESUR NEGATIVE 04/01/2019 1336   Sepsis Labs: @LABRCNTIP (procalcitonin:4,lacticidven:4)  )No results found for this or any previous visit (from the past 240 hour(s)).    Studies: No results found.  Scheduled Meds: . sodium chloride   Intravenous Once  . Chlorhexidine Gluconate Cloth  6 each Topical Daily  . docusate sodium  100 mg Oral BID  . feeding supplement (PRO-STAT SUGAR FREE 64)  30 mL Oral BID  . Gerhardt's butt cream   Topical TID  . hydrocortisone  25 mg Rectal QHS  . mouth rinse  15 mL Mouth Rinse BID  . metoprolol tartrate  12.5 mg Oral BID  . miconazole nitrate   Topical BID  . multivitamin with minerals  1 tablet Oral Daily  . pantoprazole  40 mg Oral BID  . sodium chloride flush  10-40 mL Intracatheter Q12H    Continuous Infusions: . sodium chloride 250 mL (04/19/19 1224)     LOS: 29 days     Kayleen Memos, MD [facility] Pager [phone]  If 7PM-7AM, please contact night-coverage [email or web address] Password [email or web address]  04/28/2019, 2:02 PM

## 2019-04-28 NOTE — Progress Notes (Signed)
OT Cancellation Note  Patient Details Name: JAKEEL YOU MRN: [id number] DOB: April 14, 1961   Cancelled Treatment:    Reason Eval/Treat Not Completed: Patient declined, no reason specified. OT recommends that RN staff continue to use lift/Stedy equipment for transfer to recliner/BSC with +2 for safety based on previous PT note. OT will return for evaluation as schedule allows.  [place] 04/28/2019, 4:42 PM   Hulda Humphrey OTR/L Acute Rehabilitation Services Pager: [phone] Office: [phone]

## 2019-04-29 LAB — BASIC METABOLIC PANEL
Anion gap: 7 (ref 5–15)
BUN: 15 mg/dL (ref 6–20)
CO2: 20 mmol/L — ABNORMAL LOW (ref 22–32)
Calcium: 7.8 mg/dL — ABNORMAL LOW (ref 8.9–10.3)
Chloride: 104 mmol/L (ref 98–111)
Creatinine, Ser: 0.64 mg/dL (ref 0.61–1.24)
GFR calc Af Amer: >60 mL/min (ref >60)
GFR calc non Af Amer: >60 mL/min (ref >60)
Glucose, Bld: 124 mg/dL — ABNORMAL HIGH (ref 70–99)
Potassium: 3.6 mmol/L (ref 3.5–5.1)
Sodium: 131 mmol/L — ABNORMAL LOW (ref 135–145)

## 2019-04-29 LAB — CBC
HCT: 22.2 % — ABNORMAL LOW (ref 39.0–52.0)
Hemoglobin: 7 g/dL — ABNORMAL LOW (ref 13.0–17.0)
MCH: 35.9 pg — ABNORMAL HIGH (ref 26.0–34.0)
MCHC: 31.5 g/dL (ref 30.0–36.0)
MCV: 113.8 fL — ABNORMAL HIGH (ref 80.0–100.0)
Platelets: DECREASED 10*3/uL (ref 150–400)
RBC: 1.95 MIL/uL — ABNORMAL LOW (ref 4.22–5.81)
RDW: 16.6 % — ABNORMAL HIGH (ref 11.5–15.5)
WBC: 9.9 10*3/uL (ref 4.0–10.5)
nRBC: 0 % (ref 0.0–0.2)

## 2019-04-29 LAB — MAGNESIUM: Magnesium: 2.2 mg/dL (ref 1.7–2.4)

## 2019-04-29 NOTE — Progress Notes (Addendum)
PROGRESS NOTE  Richard Calhoun [id number] DOB: Jan 27, 1961 DOA: 03/30/2019 PCP: Charlott Rakes, MD  HPI/Recap of past 42 hours:  58 year old male who presented with hematemesis and abdominal pain. He does have significant past medical history for chronic alcohol abuse, chronic tobacco abuse, coronary artery disease status post angioplasty,chronic heart failure, dyslipidemia, peripheral vascular disease and paroxysmal tachycardia. He reported malaise and hematemesis.He had poor oral intake for last 3 days prior to hospitalization. He had worsening left flank pain, and unwitnessed fall, nausea and vomiting for several days. On his initial physical examination his blood pressure was 109/52, heart rate 78, oxygen saturation 96%, rotation, heart S1-S2 present rhythmic, his abdomen was soft, left-sided hematoma,no lower extremity edema.  Sodium 139, potassium 4.9, chloride 99, bicarb 13, glucose 86, BUN 72, creatinine 3.6, AST 183, ALT 73, total bilirubins 2.6,white count 8.9, hemoglobin 8.5, hematocrit 26.0, platelets 39.SARS COVID-19 was negative. CT abdomen with and no acute changes.  Patient was admitted to the hospital with a working diagnosis of hematemesis due to upper GI bleed.  Patient was medically treated with IV Ativan, IV thiamine, Librium and IV octreotide/IV pantoprazole. He developed worsening mentation, failed noninvasive mechanical ventilation and was intubated September 13.  On September 16 patient underwent upper endoscopy showing esophageal varices and portal hypertensive gastropathy. His mentation improved and he was liberated from mechanical ventilation on September 18.  After liberation from mechanical ventilation patient continued to be encephalopathic, showing withdrawal symptoms, he required dexmedetomidine infusion along with benzodiazepines per protocol. He developed new onset atrial fibrillation with rapid ventricular response, his electrolytes have been  abnormal,and enteral nutrition was started by tube feeds on September 21.  04/28/19: Patient was seen and examined at his bedside this morning.  No acute events overnight.  Hypokalemia and hypomagnesemia noted on lab results this morning.  Bowel movements alternate between diarrhea and constipation which is chronic.  04/29/19: Patient seen and examined his bedside this morning.  No acute events overnight.  He has no new complaints.  Awaiting placement to SNF.  CSW assisting.  Hemoglobin dropped to 7.0 on 04/29/2019, asymptomatic, no chest pain, palpitation, dyspnea, or dizziness.  No overt bleeding.  Repeat H&H in the morning.  Transfuse hemoglobin less than 7.0.  Assessment/Plan: Principal Problem:   Acute hyperactive alcohol withdrawal delirium (HCC) Active Problems:   Anxiety state   EtOH dependence ([place])   Hypertensive heart disease without heart failure   S/P drug eluting coronary stent placement   Upper GI bleed   Acute renal failure (HCC)   Alcoholic hepatitis without ascites   Encephalopathy, hepatic (HCC)   Encounter for central line placement   Acute hypernatremia   Metabolic acidosis   Thrombocytopenia (HCC)   Left arm swelling   Hypomagnesemia   Chronic diastolic CHF (congestive heart failure) (HCC)   Macrocytic anemia   Atrial fibrillation, rapid (HCC)   Acute hypoxic respiratory failure likely secondary to left lower lobe aspiration pneumonitis, resolved O2 saturation 100% on room air  Non-anion gap metabolic acidosis Chemistry bicarb 19 with anion gap of 9 Start isotonic bicarb at 50 cc/h x 1 day  Hypovolemic hyponatremia Sodium 132 IV fluid as stated above Continue to monitor volume status and urine output  Macrocytic anemia No sign of active bleeding Hemoglobin stable at 7.4 with MCV of 113 Vitamin B12 and folate within normal range  Suspected IBS Self-reported chronic diarrhea alternating with constipation Appears stable at this time Diarrhea likely  contributing to hypokalemia and hypomagnesemia  Hypokalemia Potassium 3.4  Repleted with KCl p.o. 40 mEq once  Hypomagnesemia Magnesium 1.5 repleted with IV magnesium 4 g once  Resolved hepatic encephalopathy  Paroxysmal atrial fibrillation with RVR/ 2018 preserved LV systolic function. --Continue rate control withmetoprolol 12.5mg  PO BID --Not candidate for anticoagulation due to fall risk and upper GI bleed with EV  Alcoholic hepatitis, in the setting of hepatic steatosis/ cirrhosis, due to alcohol abuse. No signs of persistent hepatic encephalopathy. --Continue to hold on lactulose.  --Continue to encourage increased oral intake --Avoid hepatotoxins   Upper GI bleed with acute blood loss anemia Esophageal varices EGD 9/16 grade 1 varices distal esophagus, severe diffuse portal hypertensive gastropathy, small nonbleeding erosions gastric antrum without stigmata of recent bleeding, small dispersed erosions without bleeding duodenal bulb.  H. pylori IgG antibodies within normal limits. --Hgb 8.0-->7.4-->7.6-->7.0-->7.3>> 7.0 on 04/29/2019. --s/p transfusion 1u pRBC on 9/21 and 9/30, s/p 1u FFP on 9/15 --Protonix 40 mg p.o. twice daily --Monitor CBC q3d  Pancreatic lesion Incidental finding of a 4.8 by 3.3 cm lesion with heterogeneous internal attenuation in the tail of the pancreas on the CT abdomen/pelvis on 03/30/2019.  MRCP on 04/20/2019 3.3 x 1.8 cm cystic lesion within the tail of the pancreas either representing a pseudocyst versus cystic neoplasm. --Will need repeat imaging of the pancreas with MRI/MRCP outpatient  Thrombocytopenia: stable with no signs of bleeding  Chronic diastolic heart failure/ coronary artery disease(2018 preserved LV systolic function). Edema likely secondary to low oncotic pressure secondary to hypoproteinemia. --Continue metoprolol for rate control  T2DM. Fasting glucose is106 --continue to hold on insulin therapy for now, poor oral  intake and risk of hypoglycemia.  Improving right foot pain/edema Patient complaining of acute onset right foot pain and edema, no history of trauma.  Suspect mild fluid accumulation in the setting of malnutrition.  Uric acid level 5.1, within normal limits.  Vascular ultrasound duplex venous scan right lower extremity negative for DVT. --TED hose, continue  Moderate calorie protein malnutrition --nutrition following, continue nutritional supplements.  Severe debility/deconditioning With significant debility and deconditioning from prolonged hospitalization and critical illness.  Patient is uninsured and will have a difficulty obtaining SNF placement.  Continues to work with PT/OT with findings of unsteady with standing and pivoting. --Continue PT/OT/ST while inpatient --If unable to obtain SNF placement, patient and significant other need to find alternative means given that he is unsafe for discharge at this time due to he will be alone for multiple hours a day and he has severe deconditioning. --Social work continues to work on options.   DVT prophylaxis: SCDs Code Status: Full code Family Communication: None present at bedside Disposition Plan: Continue inpatient, PT recommends SNF, unfortunate does not have insurance, Medicare application pending, social work for coordination   Consultants:   Gastroenterology: Signed off 04/10/2019  PCCM  Procedures:   EGD 04/04/2019  Antimicrobials:  none     Objective: Vitals:   04/28/19 1347 04/28/19 2102 04/29/19 0640 04/29/19 1001  BP: (!) 103/56 116/64 (!) 93/54 112/65  Pulse: 86 97 88 91  Resp: 18 20 20    Temp: 98.7 F (37.1 C) 98.7 F (37.1 C) 98.6 F (37 C)   TempSrc: Oral Oral Oral   SpO2: 100% 99% 98%   Weight:      Height:        Intake/Output Summary (Last 24 hours) at 04/29/2019 1308 Last data filed at 04/29/2019 0900 Gross per 24 hour  Intake 439.51 ml  Output 1450 ml  Net -1010.49 ml  Filed  Weights   04/14/19 1816 04/15/19 0414 04/16/19 0357  Weight: 85.6 kg 86.7 kg 85.6 kg    Exam:  . General: 58 y.o. year-old male well-developed well-nourished no acute distress.  Alert and oriented x3.   . Cardiovascular: Regular rate and rhythm no rubs or gallops no JVD or thyromegaly noted.   Marland Kitchen Respiratory: Clear to auscultation no wheezes or rales.  Poor inspiratory effort.   . Abdomen: Soft nontender nondistended normal bowel sounds present. . Musculoskeletal: Trace lower extremity edema.  2/4 pulses in all 4 extremities.   Marland Kitchen Psychiatry: Mood is appropriate for condition and setting.   Data Reviewed: CBC: Recent Labs  Lab 04/23/19 0443 04/26/19 0355 04/28/19 0916 04/29/19 0137  WBC 7.3 9.0 9.7 9.9  HGB 7.0* 7.3* 7.4* 7.0*  HCT 22.5* 23.6* 24.0* 22.2*  MCV 113.6* 114.0* 113.7* 113.8*  PLT PLATELET CLUMPS NOTED ON SMEAR, COUNT APPEARS DECREASED PLATELET CLUMPS NOTED ON SMEAR, UNABLE TO ESTIMATE 94* PLATELET CLUMPS NOTED ON SMEAR, COUNT APPEARS DECREASED   Basic Metabolic Panel: Recent Labs  Lab 04/23/19 0443 04/26/19 0355 04/28/19 0916 04/29/19 0137  NA 135 134* 132* 131*  K 3.5 3.6 3.4* 3.6  CL 108 104 104 104  CO2 19* 19* 19* 20*  GLUCOSE 125* 106* 160* 124*  BUN 14 15 16 15   CREATININE 0.82 0.66 0.70 0.64  CALCIUM 8.1* 8.0* 8.0* 7.8*  MG 1.3* 1.4* 1.5* 2.2   GFR: Estimated Creatinine Clearance: 101.4 mL/min (by C-G formula based on SCr of 0.64 mg/dL). Liver Function Tests: No results for input(s): AST, ALT, ALKPHOS, BILITOT, PROT, ALBUMIN in the last 168 hours. No results for input(s): LIPASE, AMYLASE in the last 168 hours. No results for input(s): AMMONIA in the last 168 hours. Coagulation Profile: No results for input(s): INR, PROTIME in the last 168 hours. Cardiac Enzymes: No results for input(s): CKTOTAL, CKMB, CKMBINDEX, TROPONINI in the last 168 hours. BNP (last 3 results) No results for input(s): PROBNP in the last 8760 hours. HbA1C: No results  for input(s): HGBA1C in the last 72 hours. CBG: No results for input(s): GLUCAP in the last 168 hours. Lipid Profile: No results for input(s): CHOL, HDL, LDLCALC, TRIG, CHOLHDL, LDLDIRECT in the last 72 hours. Thyroid Function Tests: No results for input(s): TSH, T4TOTAL, FREET4, T3FREE, THYROIDAB in the last 72 hours. Anemia Panel: No results for input(s): VITAMINB12, FOLATE, FERRITIN, TIBC, IRON, RETICCTPCT in the last 72 hours. Urine analysis:    Component Value Date/Time   COLORURINE AMBER (A) 04/01/2019 1336   APPEARANCEUR CLEAR 04/01/2019 1336   LABSPEC 1.011 04/01/2019 1336   PHURINE 6.0 04/01/2019 1336   GLUCOSEU NEGATIVE 04/01/2019 1336   GLUCOSEU NEGATIVE 08/29/2012 1520   HGBUR NEGATIVE 04/01/2019 1336   BILIRUBINUR NEGATIVE 04/01/2019 1336   KETONESUR 20 (A) 04/01/2019 1336   PROTEINUR NEGATIVE 04/01/2019 1336   UROBILINOGEN 0.2 08/29/2012 1520   NITRITE NEGATIVE 04/01/2019 1336   LEUKOCYTESUR NEGATIVE 04/01/2019 1336   Sepsis Labs: @LABRCNTIP (procalcitonin:4,lacticidven:4)  )No results found for this or any previous visit (from the past 240 hour(s)).    Studies: No results found.  Scheduled Meds: . sodium chloride   Intravenous Once  . Chlorhexidine Gluconate Cloth  6 each Topical Daily  . docusate sodium  100 mg Oral BID  . feeding supplement (PRO-STAT SUGAR FREE 64)  30 mL Oral BID  . Gerhardt's butt cream   Topical TID  . hydrocortisone  25 mg Rectal QHS  . mouth rinse  15 mL Mouth  Rinse BID  . metoprolol tartrate  12.5 mg Oral BID  . miconazole nitrate   Topical BID  . multivitamin with minerals  1 tablet Oral Daily  . pantoprazole  40 mg Oral BID  . sodium chloride flush  10-40 mL Intracatheter Q12H    Continuous Infusions: . sodium chloride 250 mL (04/19/19 1224)  .  sodium bicarbonate (isotonic) infusion in sterile water 50 mL/hr at 04/28/19 1559     LOS: 30 days     Kayleen Memos, MD [facility] Pager [phone]  If 7PM-7AM,  please contact night-coverage [email or web address] Password [email or web address] 04/29/2019, 1:08 PM

## 2019-04-29 NOTE — Evaluation (Signed)
Occupational Therapy Evaluation Patient Details Name: Richard Calhoun MRN: [id number] DOB: 1961-01-17 Today's Date: 04/29/2019    History of Present Illness 58 yo male admitted to ED on 9/10 for R flank pain, ETOH, N/V. Pt requiring intubation 9/13-9/18.Endoscopy reveals nonbleeding erosive gastropathy, duodenal erosions; placed feeding tube. Pt with new diagnosis LLL aspiration pneumonitis, hypoactive delirium. PMH includes anxiety, alcohol abuse, depression, GERD, HTN, HLD, tubular adenoma of colon, CAD with stent placement, PVD.   Clinical Impression   Pt admitted with above. He demonstrates the below listed deficits and will benefit from continued OT to maximize safety and independence with BADLs.  Pt presents to OT with generalized weakness, impaired balance, decreased activity tolerance, impaired cognition.  He currently requires min - total A for ADLs. He is very internally distracted and frequently requires cues to redirect him to task at hand.  He was unable to stand today due to pain bil. Feet. He reports he lived with his wife PTA, was independent with ADLs, but was not working and does not drive.  Recommend SNF level rehab at discharge.       Follow Up Recommendations  SNF    Equipment Recommendations  None recommended by OT    Recommendations for Other Services       Precautions / Restrictions Precautions Precautions: Fall Precaution Comments: CIWA, very softspoken      Mobility Bed Mobility Overal bed mobility: Needs Assistance Bed Mobility: Supine to Sit;Sit to Supine     Supine to sit: Min assist Sit to supine: Min guard   General bed mobility comments: able to move LEs off bed, but requires assist to lift trunk   Transfers Overall transfer level: Needs assistance Equipment used: Ambulation equipment used   Sit to Stand: Total assist         General transfer comment: Pt attempted to stand x 3, but unable due to increased pain bil. feet     Balance  Overall balance assessment: Needs assistance Sitting-balance support: Feet supported Sitting balance-Leahy Scale: Fair Sitting balance - Comments: able to maintain static sitting EOB with min guard assist                                    ADL either performed or assessed with clinical judgement   ADL Overall ADL's : Needs assistance/impaired Eating/Feeding: Set up;Sitting   Grooming: Wash/dry hands;Wash/dry face;Oral care;Brushing hair;Minimal assistance;Sitting   Upper Body Bathing: Moderate assistance;Sitting   Lower Body Bathing: Maximal assistance;Bed level;Sitting/lateral leans   Upper Body Dressing : Maximal assistance;Sitting   Lower Body Dressing: Total assistance;Sit to/from stand   Toilet Transfer: Total assistance Toilet Transfer Details (indicate cue type and reason): unable to tolerate this date due to foot pain  Toileting- Clothing Manipulation and Hygiene: Total assistance;Bed level       Functional mobility during ADLs: Total assistance General ADL Comments: pt limited this date to to bil. foot pain and distractability      Vision         Perception     Praxis      Pertinent Vitals/Pain Pain Assessment: Faces Faces Pain Scale: Hurts even more Pain Location: bil feet  Pain Descriptors / Indicators: Grimacing;Guarding Pain Intervention(s): Monitored during session;Repositioned;Limited activity within patient's tolerance     Hand Dominance Right   Extremity/Trunk Assessment Upper Extremity Assessment Upper Extremity Assessment: Generalized weakness;RUE deficits/detail;LUE deficits/detail RUE Deficits / Details: tremulous  RUE Coordination: decreased  fine motor LUE Deficits / Details: tremulous  LUE Coordination: decreased fine motor   Lower Extremity Assessment Lower Extremity Assessment: Defer to PT evaluation   Cervical / Trunk Assessment Cervical / Trunk Assessment: Normal   Communication Communication Communication:  Expressive difficulties(soft spoken )   Cognition Arousal/Alertness: Awake/alert Behavior During Therapy: Flat affect(irritable ) Overall Cognitive Status: Impaired/Different from baseline Area of Impairment: Attention;Following commands;Safety/judgement;Awareness;Problem solving                   Current Attention Level: Selective   Following Commands: Follows one step commands consistently;Follows multi-step commands inconsistently;Follows one step commands with increased time Safety/Judgement: Decreased awareness of deficits Awareness: Intellectual Problem Solving: Slow processing;Decreased initiation;Difficulty sequencing;Requires verbal cues General Comments: Pt is internally distracted by discomfort.  He has poor awareness of how deficits may impact him long term.    General Comments       Exercises     Shoulder Instructions      Home Living Family/patient expects to be discharged to:: Skilled nursing facility                                 Additional Comments: Pt reports he lives with wife.        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 a Oncologist for many years         OT Problem List: Decreased strength;Decreased activity tolerance;Impaired balance (sitting and/or standing);Decreased safety awareness;Decreased knowledge of use of DME or AE;Pain;Obesity;Impaired UE functional use;Decreased cognition;Decreased coordination      OT Treatment/Interventions: Self-care/ADL training;Therapeutic exercise;DME and/or AE instruction;Therapeutic activities;Cognitive remediation/compensation;Patient/family education;Balance training    OT Goals(Current goals can be found in the care plan section) Acute Rehab OT Goals Patient Stated Goal: to get stronger and get out of here  OT Goal Formulation: With patient Time For Goal Achievement: 05/13/19 Potential to  Achieve Goals: Good ADL Goals Pt Will Perform Grooming: with set-up;sitting Pt Will Perform Upper Body Bathing: with set-up;sitting Pt Will Perform Lower Body Bathing: with mod assist;sit to/from stand Pt Will Perform Upper Body Dressing: with set-up;sitting Pt Will Perform Lower Body Dressing: with mod assist;with adaptive equipment;sit to/from stand Pt Will Transfer to Toilet: with mod assist;stand pivot transfer;bedside commode Pt Will Perform Toileting - Clothing Manipulation and hygiene: with mod assist;sit to/from stand Pt/caregiver will Perform Home Exercise Program: Both right and left upper extremity;Increased strength;With theraband;With Supervision;With written HEP provided Additional ADL Goal #1: Pt will be able to selectively attend to ADL tasks x 8 mins with no cues  OT Frequency: Min 2X/week   Barriers to D/C: Decreased caregiver support          Co-evaluation              AM-PAC OT "6 Clicks" Daily Activity     Outcome Measure Help from another person eating meals?: A Little Help from another person taking care of personal grooming?: A Little Help from another person toileting, which includes using toliet, bedpan, or urinal?: A Lot Help from another person bathing (including washing, rinsing, drying)?: A Lot Help from another person to put on and taking off regular upper body clothing?: A Lot Help from another person to put on and taking off regular lower body clothing?: Total 6 Click Score: 13   End of Session Nurse Communication: Mobility status  Activity Tolerance: Patient limited by pain Patient left: in bed;with call bell/phone within reach;with bed alarm set  OT Visit Diagnosis: Unsteadiness on feet (R26.81);Pain;Cognitive communication deficit (R41.841);Muscle weakness (generalized) (M62.81) Pain - Right/Left: Left Pain - part of body: Ankle and joints of foot                Time: [id number] OT Time Calculation (min): 30 min Charges:  OT General  Charges $OT Visit: 1 Visit OT Evaluation $OT Eval Moderate Complexity: 1 Mod OT Treatments $Therapeutic Activity: 8-22 mins  Lucille Passy, OTR/L Acute Rehabilitation Services Pager [phone] Office [phone]   Lucille Passy M 04/29/2019, 4:15 PM

## 2019-04-30 LAB — HEMOGLOBIN AND HEMATOCRIT, BLOOD
HCT: 22.4 % — ABNORMAL LOW (ref 39.0–52.0)
Hemoglobin: 7.1 g/dL — ABNORMAL LOW (ref 13.0–17.0)

## 2019-04-30 MED ORDER — GABAPENTIN 300 MG PO CAPS
300.0000 mg | ORAL_CAPSULE | Freq: Two times a day (BID) | ORAL | Status: DC
  Administered 2019-04-30 – 2019-05-01 (×3): 300 mg via ORAL
  Filled 2019-04-30 (×2): qty 1

## 2019-04-30 NOTE — Progress Notes (Signed)
PROGRESS NOTE  Richard Calhoun [id number] DOB: [birth year] DOA: 03/30/2019 PCP: Charlott Rakes, MD  HPI/Recap of past 4 hours:  58 year old male who presented with hematemesis and abdominal pain. He does have significant past medical history for chronic alcohol abuse, chronic tobacco abuse, coronary artery disease status post angioplasty,chronic heart failure, dyslipidemia, peripheral vascular disease and paroxysmal tachycardia. He reported malaise and hematemesis.He had poor oral intake for last 3 days prior to hospitalization. He had worsening left flank pain, and unwitnessed fall, nausea and vomiting for several days. On his initial physical examination his blood pressure was 109/52, heart rate 78, oxygen saturation 96%, rotation, heart S1-S2 present rhythmic, his abdomen was soft, left-sided hematoma,no lower extremity edema.  Sodium 139, potassium 4.9, chloride 99, bicarb 13, glucose 86, BUN 72, creatinine 3.6, AST 183, ALT 73, total bilirubins 2.6,white count 8.9, hemoglobin 8.5, hematocrit 26.0, platelets 39.SARS COVID-19 was negative. CT abdomen with and no acute changes.  Patient was admitted to the hospital with a working diagnosis of hematemesis due to upper GI bleed.  Patient was medically treated with IV Ativan, IV thiamine, Librium and IV octreotide/IV pantoprazole. He developed worsening mentation, failed noninvasive mechanical ventilation and was intubated September 13.  On September 16 patient underwent upper endoscopy showing esophageal varices and portal hypertensive gastropathy. His mentation improved and he was liberated from mechanical ventilation on September 18.  After liberation from mechanical ventilation patient continued to be encephalopathic, showing withdrawal symptoms, he required dexmedetomidine infusion along with benzodiazepines per protocol. He developed new onset atrial fibrillation with rapid ventricular response, his electrolytes have been  abnormal,and enteral nutrition was started by tube feeds on September 21.  Reassessed by speech therapy, now on dysphagia 3 diet.  PT OT assessed and recommended SNF.  CSW assisting with SNF placement.  Continues with significant weakness and debility.  04/30/19: Patient was seen and examined at bedside this morning.  Reports burning pain in his feet bilaterally.  Will do a trial of gabapentin and see if it improves his symptoms.   Assessment/Plan: Principal Problem:   Acute hyperactive alcohol withdrawal delirium (HCC) Active Problems:   Anxiety state   EtOH dependence ([place])   Hypertensive heart disease without heart failure   S/P drug eluting coronary stent placement   Upper GI bleed   Acute renal failure (HCC)   Alcoholic hepatitis without ascites   Encephalopathy, hepatic (HCC)   Encounter for central line placement   Acute hypernatremia   Metabolic acidosis   Thrombocytopenia (HCC)   Left arm swelling   Hypomagnesemia   Chronic diastolic CHF (congestive heart failure) (HCC)   Macrocytic anemia   Atrial fibrillation, rapid (HCC)   Acute hypoxic respiratory failure likely secondary to left lower lobe aspiration pneumonitis, resolved O2 saturation 100% on room air  Improving non-anion gap metabolic acidosis Chemistry bicarb 20 and GFR of 7 on 04/29/2019.  Received isotonic bicarb  Hypervolemic hyponatremia Sodium 132>> 131 Asymptomatic If worsening may consider fluid restriction  Macrocytic anemia No sign of active bleeding Hemoglobin 7.1 on 04/30/2019. 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  Resolved hypokalemia post repletion Potassium 3.4>> 3.6 on 04/29/2019 Repleted with  KCl p.o. 40 mEq once  Resolved hypomagnesemia post repletion Magnesium 1.5 repleted with IV magnesium 4 g once Magnesium 2.2  on 04/29/2019  Resolved hepatic encephalopathy  Paroxysmal atrial fibrillation with RVR/ 2018 preserved LV systolic function. --Continue rate control withmetoprolol 12.5mg  PO BID --Not candidate for anticoagulation due to fall risk and upper GI bleed with EV  Alcoholic hepatitis, in the setting of hepatic steatosis/ cirrhosis, due to alcohol abuse. No signs of persistent hepatic encephalopathy. --Continue to encourage increased oral intake --Avoid hepatotoxins  Dysphagia 3 Diet per speech therapy recommendation Aspiration precautions   Upper GI bleed with acute blood loss anemia Esophageal varices EGD 9/16 grade 1 varices distal esophagus, severe diffuse portal hypertensive gastropathy, small nonbleeding erosions gastric antrum without stigmata of recent bleeding, small dispersed erosions without bleeding duodenal bulb.  H. pylori IgG antibodies within normal limits. --Hgb 8.0-->7.4-->7.6-->7.0-->7.3>> 7.0 on 04/29/2019>> 7.1 on 05/08/2019. --s/p transfusion 1u pRBC on 9/21 and 9/30, s/p 1u FFP on 9/15 --Protonix 40 mg p.o. twice daily --Monitor CBC q3d  Pancreatic lesion Incidental finding of a 4.8 by 3.3 cm lesion with heterogeneous internal attenuation in the tail of the pancreas on the CT abdomen/pelvis on 03/30/2019.  MRCP on 04/20/2019 3.3 x 1.8 cm cystic lesion within the tail of the pancreas either representing a pseudocyst versus cystic neoplasm. --Will need repeat imaging of the pancreas with MRI/MRCP outpatient  Thrombocytopenia: stable with no signs of bleeding  Chronic diastolic heart failure/ coronary artery disease(2018 preserved LV systolic function). Edema likely secondary to low oncotic pressure secondary to hypoproteinemia. --Continue metoprolol for rate control  T2DM. Fasting glucose is106 --continue to hold off insulin  therapy for now, poor oral intake and risk of hypoglycemia.  Improving right foot pain/edema Patient complaining of acute onset right foot pain and edema, no history of trauma.  Suspect mild fluid accumulation in the setting of malnutrition.  Uric acid level 5.1, within normal limits.  Vascular ultrasound duplex venous scan right lower extremity negative for DVT. Elevate legs  Moderate calorie protein malnutrition --nutrition following, continue nutritional supplements.  Severe debility/deconditioning With significant debility and deconditioning from prolonged hospitalization and critical illness.  Patient is uninsured and will have a difficulty obtaining SNF placement.  Continues to work with PT/OT with findings of unsteady with standing and pivoting. --Continue PT/OT/ST while inpatient --If unable to obtain SNF placement, patient and significant other need to find alternative means given that he is unsafe for discharge at this time due to he will be alone for multiple hours a day and he has severe deconditioning. --Social work continues to work on options.   DVT prophylaxis: SCDs Code Status: Full code Family Communication: None present at bedside Disposition Plan: Continue inpatient, PT recommends SNF, unfortunate does not have insurance, Medicare application pending, social work for coordination   Consultants:   Gastroenterology: Signed off 04/10/2019  PCCM  Procedures:   EGD 04/04/2019  Antimicrobials:  none     Objective: Vitals:   04/29/19 1405 04/29/19 2041 04/29/19 2200 04/30/19 0631  BP: 113/64 (!) 114/58 116/66 (!) 109/59  Pulse: 87 99 (!) 102 90  Resp: 18 18  18   Temp: 98.5 F (36.9 C) 98.5 F (36.9 C)  98.4 F (36.9 C)  TempSrc: Oral Oral  Oral  SpO2: 100% 98%  99%  Weight:      Height:        Intake/Output Summary (Last 24 hours) at 04/30/2019 0957 Last data filed at 04/30/2019 0756 Gross per 24 hour  Intake 1334.17 ml  Output 1000 ml    Net 334.17 ml   Filed Weights   04/14/19 1816 04/15/19  [id number] 04/16/19 0357  Weight: 85.6 kg 86.7 kg 85.6 kg    Exam:   General: 58 y.o. year-old male developed well-nourished in no acute distress.  Alert and oriented x3.  Cardiovascular: Regular rate and rhythm no rubs or gallops no JVD anatomically noted.    Respiratory: Clear to auscultation no wheezes no rales.  Poor inspiratory effort.  Abdomen: Soft nontender nondistended normal bowel sounds present.    Musculoskeletal: Trace lower extremity edema bilaterally.  2 out of 4 pulses in all 4 extremities.  Psychiatry: Mood is irritable.   Data Reviewed: CBC: Recent Labs  Lab 04/26/19 0355 04/28/19 0916 04/29/19 0137 04/30/19 0318  WBC 9.0 9.7 9.9  --   HGB 7.3* 7.4* 7.0* 7.1*  HCT 23.6* 24.0* 22.2* 22.4*  MCV 114.0* 113.7* 113.8*  --   PLT PLATELET CLUMPS NOTED ON SMEAR, UNABLE TO ESTIMATE 94* PLATELET CLUMPS NOTED ON SMEAR, COUNT APPEARS DECREASED  --    Basic Metabolic Panel: Recent Labs  Lab 04/26/19 0355 04/28/19 0916 04/29/19 0137  NA 134* 132* 131*  K 3.6 3.4* 3.6  CL 104 104 104  CO2 19* 19* 20*  GLUCOSE 106* 160* 124*  BUN 15 16 15   CREATININE 0.66 0.70 0.64  CALCIUM 8.0* 8.0* 7.8*  MG 1.4* 1.5* 2.2   GFR: Estimated Creatinine Clearance: 101.4 mL/min (by C-G formula based on SCr of 0.64 mg/dL). Liver Function Tests: No results for input(s): AST, ALT, ALKPHOS, BILITOT, PROT, ALBUMIN in the last 168 hours. No results for input(s): LIPASE, AMYLASE in the last 168 hours. No results for input(s): AMMONIA in the last 168 hours. Coagulation Profile: No results for input(s): INR, PROTIME in the last 168 hours. Cardiac Enzymes: No results for input(s): CKTOTAL, CKMB, CKMBINDEX, TROPONINI in the last 168 hours. BNP (last 3 results) No results for input(s): PROBNP in the last 8760 hours. HbA1C: No results for input(s): HGBA1C in the last 72 hours. CBG: No results for input(s): GLUCAP in the last 168  hours. Lipid Profile: No results for input(s): CHOL, HDL, LDLCALC, TRIG, CHOLHDL, LDLDIRECT in the last 72 hours. Thyroid Function Tests: No results for input(s): TSH, T4TOTAL, FREET4, T3FREE, THYROIDAB in the last 72 hours. Anemia Panel: No results for input(s): VITAMINB12, FOLATE, FERRITIN, TIBC, IRON, RETICCTPCT in the last 72 hours. Urine analysis:    Component Value Date/Time   COLORURINE AMBER (A) 04/01/2019 1336   APPEARANCEUR CLEAR 04/01/2019 1336   LABSPEC 1.011 04/01/2019 1336   PHURINE 6.0 04/01/2019 1336   GLUCOSEU NEGATIVE 04/01/2019 1336   GLUCOSEU NEGATIVE 08/29/2012 1520   HGBUR NEGATIVE 04/01/2019 1336   BILIRUBINUR NEGATIVE 04/01/2019 1336   KETONESUR 20 (A) 04/01/2019 1336   PROTEINUR NEGATIVE 04/01/2019 1336   UROBILINOGEN 0.2 08/29/2012 1520   NITRITE NEGATIVE 04/01/2019 1336   LEUKOCYTESUR NEGATIVE 04/01/2019 1336   Sepsis Labs: @LABRCNTIP (procalcitonin:4,lacticidven:4)  )No results found for this or any previous visit (from the past 240 hour(s)).    Studies: No results found.  Scheduled Meds:  sodium chloride   Intravenous Once   Chlorhexidine Gluconate Cloth  6 each Topical Daily   docusate sodium  100 mg Oral BID   feeding supplement (PRO-STAT SUGAR FREE 64)  30 mL Oral BID   Gerhardt's butt cream   Topical TID   hydrocortisone  25 mg Rectal QHS   mouth rinse  15 mL Mouth Rinse BID   metoprolol tartrate  12.5 mg Oral BID   miconazole nitrate   Topical BID   multivitamin with minerals  1 tablet Oral Daily   pantoprazole  40 mg Oral BID   sodium chloride flush  10-40 mL Intracatheter Q12H    Continuous Infusions:  sodium chloride 250 mL (04/19/19 1224)     LOS: 31 days     Kayleen Memos, MD [facility] Pager [phone]  If 7PM-7AM, please contact night-coverage [email or web address] Password [email or web address] 04/30/2019, 9:57 AM

## 2019-04-30 NOTE — TOC Progression Note (Signed)
Transition of Care [facility]) - Progression Note    Patient Details  Name: Richard Calhoun MRN: [id number] Date of Birth: [birth year]  Transition of Care [facility]) CM/SW Contact  Leeroy Cha, RN Phone Number: 04/30/2019, 2:33 PM  Clinical Narrative:    tct-lesie at [phone] the s.o./per anne leamon is now [facility] pending. tct-tammy blakley to see if [facility] or [place] will take.   Expected Discharge Plan: Skilled Nursing Facility Barriers to Discharge: Continued Medical Work up, Inadequate or no insurance  Expected Discharge Plan and Services Expected Discharge Plan: [place] In-house Referral: Clinical Social Work Discharge Planning Services: CM Consult Post Acute Care Choice: [place] Living arrangements for the past 2 months: Single Family Home Expected Discharge Date: (unknown)                                     Social Determinants of Health (SDOH) Interventions    Readmission Risk Interventions Readmission Risk Prevention Plan 04/04/2019  Transportation Screening Complete  PCP or Specialist Appt within 3-5 Days Not Complete  Not Complete comments Not ready for dc  HRI or [place] Not Complete  HRI or Home Care Consult comments NA  Social Work Consult for [place] Planning/Counseling Not Complete  SW consult not completed comments Pt confused  Palliative Care Screening Not Applicable  Medication Review [employer]) Complete  Some recent data might be hidden

## 2019-05-01 DIAGNOSIS — K7031 Alcoholic cirrhosis of liver with ascites: Secondary | ICD-10-CM

## 2019-05-01 LAB — BASIC METABOLIC PANEL
Anion gap: 9 (ref 5–15)
BUN: 13 mg/dL (ref 6–20)
CO2: 20 mmol/L — ABNORMAL LOW (ref 22–32)
Calcium: 8.1 mg/dL — ABNORMAL LOW (ref 8.9–10.3)
Chloride: 100 mmol/L (ref 98–111)
Creatinine, Ser: 0.72 mg/dL (ref 0.61–1.24)
GFR calc Af Amer: >60 mL/min (ref >60)
GFR calc non Af Amer: >60 mL/min (ref >60)
Glucose, Bld: 99 mg/dL (ref 70–99)
Potassium: 3.8 mmol/L (ref 3.5–5.1)
Sodium: 129 mmol/L — ABNORMAL LOW (ref 135–145)

## 2019-05-01 LAB — CBC
HCT: 23.5 % — ABNORMAL LOW (ref 39.0–52.0)
Hemoglobin: 7.3 g/dL — ABNORMAL LOW (ref 13.0–17.0)
MCH: 35.1 pg — ABNORMAL HIGH (ref 26.0–34.0)
MCHC: 31.1 g/dL (ref 30.0–36.0)
MCV: 113 fL — ABNORMAL HIGH (ref 80.0–100.0)
Platelets: 93 10*3/uL — ABNORMAL LOW (ref 150–400)
RBC: 2.08 MIL/uL — ABNORMAL LOW (ref 4.22–5.81)
RDW: 16.6 % — ABNORMAL HIGH (ref 11.5–15.5)
WBC: 10.8 10*3/uL — ABNORMAL HIGH (ref 4.0–10.5)
nRBC: 0 % (ref 0.0–0.2)

## 2019-05-01 LAB — MAGNESIUM: Magnesium: 1.8 mg/dL (ref 1.7–2.4)

## 2019-05-01 MED ORDER — LACTULOSE 10 GM/15ML PO SOLN
10.0000 g | Freq: Every day | ORAL | Status: DC
  Administered 2019-05-01 – 2019-05-25 (×23): 10 g via ORAL
  Filled 2019-05-01 (×25): qty 15

## 2019-05-01 MED ORDER — SPIRONOLACTONE 25 MG PO TABS: 50.0000 mg | ORAL_TABLET | Freq: Every day | ORAL | Status: DC

## 2019-05-01 MED ORDER — SPIRONOLACTONE 25 MG PO TABS
25.0000 mg | ORAL_TABLET | Freq: Every day | ORAL | Status: DC
  Administered 2019-05-01 – 2019-05-07 (×7): 25 mg via ORAL
  Filled 2019-05-01 (×7): qty 1

## 2019-05-01 MED ORDER — FUROSEMIDE 20 MG PO TABS
20.0000 mg | ORAL_TABLET | Freq: Every day | ORAL | Status: DC
  Administered 2019-05-01 – 2019-05-25 (×25): 20 mg via ORAL
  Filled 2019-05-01 (×27): qty 1

## 2019-05-01 MED ORDER — GABAPENTIN 100 MG PO CAPS
200.0000 mg | ORAL_CAPSULE | Freq: Two times a day (BID) | ORAL | Status: DC
  Administered 2019-05-01 – 2019-05-25 (×48): 200 mg via ORAL
  Filled 2019-05-01 (×49): qty 2

## 2019-05-01 NOTE — TOC Progression Note (Addendum)
Transition of Care [facility]) - Progression Note    Patient Details  Name: Richard Calhoun MRN: [id number] Date of Birth: 03/07/1961  Transition of Care [facility]) CM/SW Contact  Leeroy Cha, RN Phone Number: 05/01/2019, 2:24 PM  Clinical Narrative:    tct-debbie at [facility] message left. To return call/ Pt was [facility]ed out to the [facility] [place] as well.  tcf-Chris/may consider if has a log.  Direct report Jolene Provost notified.  Expected Discharge Plan: [place] Barriers to Discharge: Continued Medical Work up, Inadequate or no insurance Information sent to [facility] in [place] and [employer] Expected Discharge Plan and Services Expected Discharge Plan: [place] In-house Referral: Clinical Social Work Discharge Planning Services: [employer] Consult Post Acute Care Choice: [place] Living arrangements for the past 2 months: Single Family Home Expected Discharge Date: (unknown)                                     Social Determinants of Health (SDOH) Interventions    Readmission Risk Interventions Readmission Risk Prevention Plan 04/04/2019  Transportation Screening Complete  PCP or Specialist Appt within 3-5 Days Not Complete  Not Complete comments Not ready for dc  HRI or [place] Not Complete  HRI or Home Care Consult comments NA  Social Work Consult for [place] Planning/Counseling Not Complete  SW consult not completed comments Pt confused  Palliative Care Screening Not Applicable  Medication Review [employer] photographer) Complete  Some recent data might be hidden

## 2019-05-01 NOTE — Progress Notes (Signed)
Nutrition Follow-up  INTERVENTION:   -D/c Prostat -Multivitamin with minerals daily -Magic cup BID with meals, each supplement provides 290 kcal and 9 grams of protein  NUTRITION DIAGNOSIS:   Increased nutrient needs related to acute illness as evidenced by estimated needs.  Ongoing.  GOAL:   Patient will meet greater than or equal to 90% of their needs  Progressing.  MONITOR:   PO intake, Supplement acceptance, Labs, Weight trends  ASSESSMENT:   58 year-old male with medical history of chronic alcohol abuse (half gallon of rum/day), CAD and PCI, unstable angina with stent placement in 2018, CHF, hyperlipidemia, mild peripheral vascular disease, and tachycardia. Patient presented to the ED on 9/11 d/t increasing malaise and increased sleeping duration x5 days, vomiting and poor PO intakes x3 days.  **RD working remotely**  Patient currently consuming 0-100% of meals. Pt is not taking Prostat supplements, will d/c. Will continue Magic cups and daily MVI.  Per MD note, pt awaiting SNF placement.  Admission weight: 195 lbs. No new weight since 9/28.  I/Os: -10.8L since 9/29 UOP: 350 ml so far today  Medications: Calcium carbonate PRN Labs reviewed: Low Na  Diet Order:   Diet Order            DIET DYS 3 Room service appropriate? No; Fluid consistency: Thin  Diet effective now              EDUCATION NEEDS:   No education needs have been identified at this time  Skin:  Skin Assessment: Reviewed RN Assessment  Last BM:  10/13  Height:   Ht Readings from Last 1 Encounters:  04/14/19 5\' 10"  (1.778 m)    Weight:   Wt Readings from Last 1 Encounters:  04/16/19 85.6 kg    Ideal Body Weight:  75.4 kg  BMI:  Body mass index is 27.08 kg/m.  Estimated Nutritional Needs:   Kcal:  1900-2100 kcal  Protein:  95-105 grams  Fluid:  >/= 2 L/day  Clayton Bibles, MS, RD, LDN Inpatient Clinical Dietitian Pager: [phone] After Hours Pager: [phone]

## 2019-05-01 NOTE — Progress Notes (Addendum)
PROGRESS NOTE    Richard Calhoun  [id number]  DOB: 10-26-1960  DOA: 03/30/2019 PCP: Charlott Rakes, MD  Brief Narrative:  58 year old male with h/o chronic alcohol abuse, chronic tobacco abuse, CAD status post angioplasty,CHF, dyslipidemia, PVD and paroxysmal tachycardia presented with c/o hematemesis and abdominal pain.He had poor oral intake for last 3 days prior to hospitalization. He had worsening left flank pain, and unwitnessed fall, nausea and vomiting for several days.  ED course:  blood pressure was 109/52, heart rate 78, oxygen saturation 96%,  Sodium 139, potassium 4.9, chloride 99, bicarb 13, glucose 86, BUN 72, creatinine 3.6, AST 183, ALT 73, total bilirubins 2.6,white count 8.9, hemoglobin 8.5, hematocrit 26.0, platelets 39.SARS COVID-19 was negative. CT abdomen with and no acute changes. Hospital course:Patient was admitted to the hospital with a working diagnosis of hematemesis due to upper GI bleed. Patient was medically treated with IV Ativan, IV thiamine, Librium and IV octreotide/IV pantoprazole. He developed worsening mentation, failed noninvasive mechanical ventilation and was intubated 9/13. On 9/16 patient underwent upper endoscopy showing esophageal varices and portal hypertensive gastropathy. His mentation improved and he was liberated from mechanical ventilation on 9/18.However,patient continued to be encephalopathic, showing withdrawal symptoms, he required dexmedetomidine infusion along with benzodiazepines per protocol. He developed new onset atrial fibrillation with rapid ventricular response, his electrolytes have been abnormal,and enteral nutrition was started by tube feeds on 9/21.Reassessed by speech therapy, now on dysphagia 3 diet.  Continues with significant weakness and debility andCSW assisting with SNF placement  Subjective:  Patient seen in rounds. Feels week and deconditioned but OX 3. PT bedside.     Objective: Vitals:   04/30/19  0631 04/30/19 1449 04/30/19 2136 05/01/19 0611  BP: (!) 109/59 115/66 131/69 131/74  Pulse: 90 84 87 90  Resp: 18 16 16 18   Temp: 98.4 F (36.9 C) 98.7 F (37.1 C) 98.9 F (37.2 C) 98.3 F (36.8 C)  TempSrc: Oral Oral Oral Oral  SpO2: 99% 100% 97% 98%  Weight:      Height:        Intake/Output Summary (Last 24 hours) at 05/01/2019 0924 Last data filed at 05/01/2019 0600 Gross per 24 hour  Intake 1330 ml  Output 2225 ml  Net -895 ml   Filed Weights   04/14/19 1816 04/15/19 0414 04/16/19 0357  Weight: 85.6 kg 86.7 kg 85.6 kg    Physical Examination:  General exam: Appears calm and comfortable  Respiratory system: Clear to auscultation. Respiratory effort normal. Cardiovascular system: S1 & S2 heard, RRR. No JVD, murmurs, rubs, gallops or clicks. 1-2+ pitting pedal edema. Gastrointestinal system: Abdomen mildly distended, soft and nontender. No organomegaly or masses felt. Normal bowel sounds heard. Central nervous system: Alert and oriented. No focal neurological deficits.No asterixis Extremities: Symmetric 4x 5 power in all extremities. Skin: No rashes, lesions or ulcers Psychiatry: Judgement and insight appear normal. Mood & affect appropriate.     Data Reviewed: I have personally reviewed following labs and imaging studies  CBC: Recent Labs  Lab 04/26/19 0355 04/28/19 0916 04/29/19 0137 04/30/19 0318 05/01/19 0641  WBC 9.0 9.7 9.9  --  10.8*  HGB 7.3* 7.4* 7.0* 7.1* 7.3*  HCT 23.6* 24.0* 22.2* 22.4* 23.5*  MCV 114.0* 113.7* 113.8*  --  113.0*  PLT PLATELET CLUMPS NOTED ON SMEAR, UNABLE TO ESTIMATE 94* PLATELET CLUMPS NOTED ON SMEAR, COUNT APPEARS DECREASED  --  93*   Basic Metabolic Panel: Recent Labs  Lab 04/26/19 0355 04/28/19 0916 04/29/19 0137 05/01/19 [id number]  NA 134* 132* 131* 129*  K 3.6 3.4* 3.6 3.8  CL 104 104 104 100  CO2 19* 19* 20* 20*  GLUCOSE 106* 160* 124* 99  BUN 15 16 15 13   CREATININE 0.66 0.70 0.64 0.72  CALCIUM 8.0* 8.0* 7.8*  8.1*  MG 1.4* 1.5* 2.2 1.8   GFR: Estimated Creatinine Clearance: 101.4 mL/min (by C-G formula based on SCr of 0.72 mg/dL). Liver Function Tests: No results for input(s): AST, ALT, ALKPHOS, BILITOT, PROT, ALBUMIN in the last 168 hours. No results for input(s): LIPASE, AMYLASE in the last 168 hours. No results for input(s): AMMONIA in the last 168 hours. Coagulation Profile: No results for input(s): INR, PROTIME in the last 168 hours. Cardiac Enzymes: No results for input(s): CKTOTAL, CKMB, CKMBINDEX, TROPONINI in the last 168 hours. BNP (last 3 results) No results for input(s): PROBNP in the last 8760 hours. HbA1C: No results for input(s): HGBA1C in the last 72 hours. CBG: No results for input(s): GLUCAP in the last 168 hours. Lipid Profile: No results for input(s): CHOL, HDL, LDLCALC, TRIG, CHOLHDL, LDLDIRECT in the last 72 hours. Thyroid Function Tests: No results for input(s): TSH, T4TOTAL, FREET4, T3FREE, THYROIDAB in the last 72 hours. Anemia Panel: No results for input(s): VITAMINB12, FOLATE, FERRITIN, TIBC, IRON, RETICCTPCT in the last 72 hours. Sepsis Labs: No results for input(s): PROCALCITON, LATICACIDVEN in the last 168 hours.  No results found for this or any previous visit (from the past 240 hour(s)).    Radiology Studies: No results found.      Scheduled Meds: . sodium chloride   Intravenous Once  . Chlorhexidine Gluconate Cloth  6 each Topical Daily  . docusate sodium  100 mg Oral BID  . feeding supplement (PRO-STAT SUGAR FREE 64)  30 mL Oral BID  . gabapentin  300 mg Oral BID  . Gerhardt's butt cream   Topical TID  . hydrocortisone  25 mg Rectal QHS  . mouth rinse  15 mL Mouth Rinse BID  . metoprolol tartrate  12.5 mg Oral BID  . miconazole nitrate   Topical BID  . multivitamin with minerals  1 tablet Oral Daily  . pantoprazole  40 mg Oral BID  . sodium chloride flush  10-40 mL Intracatheter Q12H   Continuous Infusions: . sodium chloride 250 mL  (04/19/19 1224)    Assessment & Plan:    1.Acute hypoxic respiratory failure likely secondary to left lower lobe aspiration pneumonitis, resolved: O2 saturation 100% on room air now. Off tube feeds and on dysphagia 3 diet per ST recommendations  2. Alcoholic hepatitis, cirrhosis-decompensated.Appears to have leg edema/ascitis. ?Not on diuretics. Started. --Avoid hepatotoxins.   3. Hypervolemic hyponatremia :Sodium 132>> 131. In the setting of #2  4. Hepatic encephalopathy: No improved. ?Off lactulose. Will resume low dose and see if mental status improves to participate in PT  5. Suspected IBS: Self-reported chronic diarrhea alternating with constipation Appears stable at this time. Diarrhea likely contributing to hypokalemia and hypomagnesemia  6. 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 UGIB/ Acute on chronic anemia-No sign of active bleeding.On 9/16 patient underwent upper endoscopy showing esophageal varices and portal hypertensive gastropathy.s/p transfusion 1u pRBC on 9/21 and 9/30, s/p 1u FFP on 9/15.--Protonix 40 mg p.o. twice daily Hemoglobin stable around 7.3 on 04/30/2019.Vitamin B12 and folate within normal range. On b-blockers  8. AKI: POA. Now resolved. Resume diuretics and monitor closely.  9.  Paroxysmal atrial fibrillation with RVR/ 2018 preserved LV systolic function. --Continue rate control withmetoprolol 12.5mg  PO BID --Not candidate for anticoagulation due to fall risk and upper GI bleed with varices  10T2DM. Fasting glucose is106 --continue to hold off insulin therapy for now, poor oral intake and risk of hypoglycemia..   11.Pancreatic lesion Incidental finding of a 4.8 by 3.3 cm lesion with heterogeneous internal attenuation in the tail of the pancreas on the CT abdomen/pelvis on 03/30/2019. MRCP on 04/20/2019 3.3 x 1.8 cm cystic lesion within  the tail of the pancreas either representing a pseudocyst versus cystic neoplasm. --Will need repeat imaging of the pancreas with MRI/MRCP outpatient  12. Alcohol dependence/withdrawal: Currently stable. Off scheduled benzos/dexmedetomidine infusion.   123456 diastolic heart failure/ coronary artery disease(2018 preserved LV systolic function).: Leg edema in the setting of liver disease/hypoproteinemia. Resume diuretics and monitor renal function  14. Hypoklaemia/hypomagnesemia: Repleted  15. Thrombocytopenia: stable with no signs of bleeding  16. Severe debility/deconditioning With significant debility and deconditioning from prolonged hospitalization and critical illness. Patient is uninsured and will have a difficulty obtaining SNF placement. Continues to work with PT/OT with findings of unsteady with standing and pivoting. --Continue PT/OT/ST while inpatient --If unable to obtain SNF placement, patient and significant other need to find alternative means given that he is unsafe for discharge at this time due to he will be alone for multiple hours a day and he has severe deconditioning. --Social work continues to work on options.   DVT prophylaxis: Vascular ultrasound duplex venous scan right lower extremity negative for DVT.  Code Status: Full Family / Patient Communication: d/w patient Disposition Plan: TBD. Patient is uninsured and will have a difficulty obtaining SNF placement.     LOS: 32 days    Time spent: 35 minutes    Guilford Shi, MD [facility] Pager [phone]  If 7PM-7AM, please contact night-coverage [email or web address] Password [email or web address] 05/01/2019, 9:24 AM

## 2019-05-01 NOTE — Progress Notes (Signed)
Physical Therapy Treatment Patient Details Name: Richard Calhoun MRN: [id number] DOB: 1960-08-05 Today's Date: 05/01/2019    History of Present Illness 58 yo male admitted to ED on 9/10 for R flank pain, ETOH, N/V. Pt requiring intubation 9/13-9/18.Endoscopy reveals nonbleeding erosive gastropathy, duodenal erosions; placed feeding tube. Pt with new diagnosis LLL aspiration pneumonitis, hypoactive delirium. PMH includes anxiety, alcohol abuse, depression, GERD, HTN, HLD, tubular adenoma of colon, CAD with stent placement, PVD.    PT Comments    Pt in bed with lunch tray and MD in room.  Progressing slowly.  Assisted OOB.  General bed mobility comments: HOB elevated, use of rail and increased time use of bed pad to complete scooting to EOB.  B LE edema and ABD edema (increased girth).  General transfer comment: pt was able to perform sit to stand from elevated bed by pulling up from EVA walker and quickly locking B knees.  Performed pre gait marching with much difficulty and heavy lean on EVA walker.  Performed one step forward and one step backward again with heavy lean on EVA walker.  Pt c/o "I can't feel my feet". General Gait Details: + 2 side by side assist and recliner really close behind, pt was able to take 5 to 6 short shuffled steps forward but with heavy lean on EVA walker and noted B knee buckle.  VERY weak. Pt will need ST Rehab at SNF prior to returning home.   Follow Up Recommendations  SNF     Equipment Recommendations       Recommendations for Other Services       Precautions / Restrictions Precautions Precautions: Fall Precaution Comments: s/p VDRF + extended LOS Restrictions Weight Bearing Restrictions: No    Mobility  Bed Mobility   Bed Mobility: Supine to Sit     Supine to sit: Min assist     General bed mobility comments: HOB elevated, use of rail and increased time use of bed pad to complete scooting to EOB.  B LE edema and ABD edema (increased  girth)  Transfers Overall transfer level: Needs assistance Equipment used: Bilateral platform walker(EVA walker) Transfers: Sit to/from [employer] Transfers Sit to Stand: Max assist;+2 physical assistance;+2 safety/equipment Stand pivot transfers: Max assist;Total assist;+2 physical assistance       General transfer comment: pt was able to perform sit to stand from elevated bed by pulling up from EVA walker and quickly locking B knees.  Performed pre gait marching with much difficulty and heavy lean on EVA walker.  Performed one step forward and one step backward again with heavy lean on EVA walker.  Pt c/o "I can't feel my feet".  Ambulation/Gait Ambulation/Gait assistance: Max assist;Total assist;+2 physical assistance;+2 safety/equipment Gait Distance (Feet): 2 Feet Assistive device: Bilateral platform walker(EVA walker) Gait Pattern/deviations: Step-to pattern;Shuffle Gait velocity: decreased   General Gait Details: + 2 side by side assist and recliner really close behind, pt was able to take 5 to 6 short shuffled steps forward but with heavy lean on EVA walker and noted B knee buckle.  VERY weak.   Stairs             Wheelchair Mobility    Modified Rankin (Stroke Patients Only)       Balance  Cognition Arousal/Alertness: Awake/alert Behavior During Therapy: WFL for tasks assessed/performed Overall Cognitive Status: Within Functional Limits for tasks assessed                                 General Comments: AxO x 3 improving      Exercises General Exercises - Lower Extremity Ankle Circles/Pumps: Both;20 reps;Seated Long Arc Quad: Both;10 reps Heel Slides: Both;Supine;10 reps;AAROM    General Comments        Pertinent Vitals/Pain Pain Assessment: Faces Faces Pain Scale: Hurts a little bit Pain Location: "I can't feel my feet" noted edema Pain Descriptors / Indicators:  Grimacing;Guarding Pain Intervention(s): Monitored during session;Repositioned    Home Living                      Prior Function            PT Goals (current goals can now be found in the care plan section) Progress towards PT goals: Progressing toward goals    Frequency    Min 2X/week      PT Plan Current plan remains appropriate    Co-evaluation              AM-PAC PT "6 Clicks" Mobility   Outcome Measure  Help needed turning from your back to your side while in a flat bed without using bedrails?: A Lot Help needed moving from lying on your back to sitting on the side of a flat bed without using bedrails?: A Lot Help needed moving to and from a bed to a chair (including a wheelchair)?: Total Help needed standing up from a chair using your arms (e.g., wheelchair or bedside chair)?: Total Help needed to walk in hospital room?: Total Help needed climbing 3-5 steps with a railing? : Total 6 Click Score: 8    End of Session Equipment Utilized During Treatment: Gait belt Activity Tolerance: Patient limited by pain;Patient limited by fatigue;Other (comment)(weakness) Patient left: in chair;with call bell/phone within reach;with chair alarm set Nurse Communication: Need for lift equipment       Time: [phone] PT Time Calculation (min) (ACUTE ONLY): 26 min  Charges:  $Gait Training: 8-22 mins $Therapeutic Activity: 8-22 mins                     {Emeree Mahler  PTA Acute  Rehabilitation Services Pager      [phone] Office      [phone]

## 2019-05-01 NOTE — Progress Notes (Signed)
  Speech Language Pathology Treatment: Dysphagia  Patient Details Name: Richard Calhoun MRN: [id number] DOB: 13-Dec-1960 Today's Date: 05/01/2019 Time: [id number] SLP Time Calculation (min) (ACUTE ONLY): 20 min  Assessment / Plan / Recommendation Clinical Impression  SlP session to continue to sterngthen vocal fold closure and thus airway protection for swallowing; Unfortunately, pt did not recall initiation of RMST last Thursday but was able to perform exercise with max assist for appropriate rest breaks.  He reports becoming "dizzy" again after completion of 5 repetitions set at level 11. SLP lowered water pressure to 8 cm H20 after which pt tolerated much better.  Intermittent improvement with phonation noted - and cues to speak "loudly" did not consistently help.  Today pt admits to some baseline voice issues which he attributes to secretion/smoking.  Posted signs in room to encourage staff to help pt perform his exercises and level at which to perform.  Will attempt to see pt when his wife is present to encourage her to encourage pt to help pt perform.  Pt with inconsisent intake - and SLP recommends to advance diet to regular/thin with general precautions.   HPI HPI: 58 y.o. year old male admitted 03/30/2019 with reports of worsening left flank pain, unwitnessed fall, nausea and vomiting for several days and episode of hematemesis, concern for upper GI bleed causing AKI. PMH: chronic alcohol abuse, CAD s/p DES (2018), CHFpEF, HLD. Pt intubated 9/13-18/2020.  Pt has been npo with likely secretion aspiration and has had an NG placed.  NG was removed over the weekend at it was coiled in his nose.MD notified SLP of need for pt to have an evaluation to determine if he can consume po intake due to length of npo, hospital stay.Pt now on a dys3/thin diet with good tolerance.  SLP has been addressing voice/respiratory strength and RMST to initiate.      SLP Plan  Continue with current plan of care        Recommendations  Diet recommendations: Regular;Thin liquid Liquids provided via: Straw;Cup Medication Administration: Whole meds with puree Supervision: Patient able to self feed Compensations: Minimize environmental distractions;Slow rate;Small sips/bites Postural Changes and/or Swallow Maneuvers: Seated upright 90 degrees;Upright 30-60 min after meal                Oral Care Recommendations: Oral care QID;Staff/trained caregiver to provide oral care Follow up Recommendations: Skilled Nursing facility(for RMST) SLP Visit Diagnosis: Dysphagia, unspecified (R13.10) Plan: Continue with current plan of care       GO                Macario Golds 05/01/2019, 4:58 PM   Luanna Salk, [facility] SLP [place] Pager [phone] Office [phone]

## 2019-05-02 DIAGNOSIS — K7200 Acute and subacute hepatic failure without coma: Secondary | ICD-10-CM

## 2019-05-02 DIAGNOSIS — K869 Disease of pancreas, unspecified: Secondary | ICD-10-CM

## 2019-05-02 DIAGNOSIS — D500 Iron deficiency anemia secondary to blood loss (chronic): Secondary | ICD-10-CM | POA: Diagnosis present

## 2019-05-02 LAB — BASIC METABOLIC PANEL
Anion gap: 9 (ref 5–15)
BUN: 13 mg/dL (ref 6–20)
CO2: 23 mmol/L (ref 22–32)
Calcium: 8.3 mg/dL — ABNORMAL LOW (ref 8.9–10.3)
Chloride: 101 mmol/L (ref 98–111)
Creatinine, Ser: 0.64 mg/dL (ref 0.61–1.24)
GFR calc Af Amer: >60 mL/min (ref >60)
GFR calc non Af Amer: >60 mL/min (ref >60)
Glucose, Bld: 97 mg/dL (ref 70–99)
Potassium: 3.9 mmol/L (ref 3.5–5.1)
Sodium: 133 mmol/L — ABNORMAL LOW (ref 135–145)

## 2019-05-02 LAB — CBC
HCT: 22.6 % — ABNORMAL LOW (ref 39.0–52.0)
Hemoglobin: 7.1 g/dL — ABNORMAL LOW (ref 13.0–17.0)
MCH: 35.1 pg — ABNORMAL HIGH (ref 26.0–34.0)
MCHC: 31.4 g/dL (ref 30.0–36.0)
MCV: 111.9 fL — ABNORMAL HIGH (ref 80.0–100.0)
Platelets: DECREASED 10*3/uL (ref 150–400)
RBC: 2.02 MIL/uL — ABNORMAL LOW (ref 4.22–5.81)
RDW: 16.7 % — ABNORMAL HIGH (ref 11.5–15.5)
WBC: 11.4 10*3/uL — ABNORMAL HIGH (ref 4.0–10.5)
nRBC: 0 % (ref 0.0–0.2)

## 2019-05-02 LAB — HEMOGLOBIN A1C
Hgb A1c MFr Bld: 5.3 % (ref 4.8–5.6)
Mean Plasma Glucose: 105.41 mg/dL

## 2019-05-02 LAB — AMMONIA: Ammonia: 56 umol/L — ABNORMAL HIGH (ref 9–35)

## 2019-05-02 NOTE — TOC Progression Note (Signed)
Transition of Care [facility]) - Progression Note    Patient Details  Name: Richard Calhoun MRN: [id number] Date of Birth: 31-Aug-1960  Transition of Care [facility]) CM/SW Contact  Leeroy Cha, RN Phone Number: 05/02/2019, 3:48 PM  Clinical Narrative:    tct-dds for medicaid/gave number will have someone call back.   Expected Discharge Plan: Skilled Nursing Facility Barriers to Discharge: Continued Medical Work up, Inadequate or no insurance  Expected Discharge Plan and Services Expected Discharge Plan: [place] In-house Referral: Clinical Social Work Discharge Planning Services: CM Consult Post Acute Care Choice: [place] Living arrangements for the past 2 months: Single Family Home Expected Discharge Date: (unknown)                                     Social Determinants of Health (SDOH) Interventions    Readmission Risk Interventions Readmission Risk Prevention Plan 04/04/2019  Transportation Screening Complete  PCP or Specialist Appt within 3-5 Days Not Complete  Not Complete comments Not ready for dc  HRI or [place] Not Complete  HRI or Home Care Consult comments NA  Social Work Consult for [place] Planning/Counseling Not Complete  SW consult not completed comments Pt confused  Palliative Care Screening Not Applicable  Medication Review [employer] photographer) Complete  Some recent data might be hidden

## 2019-05-02 NOTE — Progress Notes (Signed)
[facility]  PROGRESS NOTE  Richard Calhoun [id number] DOB: 1961/06/03 DOA: 03/30/2019 PCP: Charlott Rakes, MD  Brief History    Richard Calhoun is a 58 y.o. year old male with medical history significant for h/o chronic alcohol abuse, chronic tobacco abuse, CAD status post angioplasty, CHF, dyslipidemia, PVD and paroxysmal tachycardia  who presented on 03/30/2019 with c/ohematemesis and abdominal pain.He had poor oral intake for last 3 days prior to hospitalization. He had worsening left flank pain, and unwitnessed fall, nausea and vomiting for several days.  Patient was admitted with diagnosis of hematemesis due to upper GI bleed medically treated with IV octreotide/IV pantoprazole.  Patient was ultimately intubated due to worsening mentation on 9/13.  Underwent EGD (9/16) found to have esophageal varices with portal hypertensive gastropathy.  Patient was extubated on 9/18.  Patient continued to have encephalopathic symptoms presumed initially to be withdrawal symptoms however this is outside of the time range for alcohol withdrawal presumed to be related to ICU acquired delirium.  Course also complicated by new onset atrial fibrillation with RVR.  He required enteral nutrition with tube feeds starting on 9/21 was able to transition to dysphagia 3 diet after speech therapy evaluation.  Patient currently medically stable but significantly weak and debilitated CSW is assisting with SNF placement  A & P     Acute hypoxic respiratory failure likely secondary left lower lobe aspiration pneumonitis, resolved.  On room air, normal respiratory effort.  Continue dysphagia 3 diet per speech therapy recommendations.   Alcohol hepatitis, cirrhosis.  Supportive care for hepatitis during hospital stay.  Cirrhosis seems compensated now does have 2+ edema lower extremity likely related to position/hypoalbuminemia.  Continue spironolactone, Lasix   Hepatic encephalopathy, resolved.  Alert and oriented  x3 today, continue lactulose therapy, closely monitor BM output and mental status.  No asterixis present.   Atrial fibrillation, normal sinus rhythm, rate controlled.  Continue Lopressor.  Not a candidate for anticoagulation due to falls related to ongoing alcohol use especially in setting of known GI bleeding risks with esophageal varices   Thrombocytopenia, related to alcohol abuse/hepatic steatosis/alcoholic apicitis.  Stable 80s to 90s.  No active signs of bleeding, continue to closely monitor.   Acute on chronic anemia, stable.  Worse in the setting of upper GI bleed related to esophageal varices/portal hypertensive gastropathy on EGD on admission.  Hemoglobin remained stable, no active signs of bleeding, continue to closely monitor.   Type 2 diabetes.  Closely monitor, holding off on long-acting insulin given fasting glucose less than normal limits, sliding scale as needed   Pancreatic lesion will need outpatient MRI/MRCP for repeat imaging given incidental finding on CT abdomen on admission   Alcohol abuse.  Outside window for withdrawal.  Alcohol cessation emphasized.   Hypokalemia/hypomagnesemia, repleted.   Severe debility/deconditioningRelated to ICU stay, prolonged hospitalization.  Acute illness.  CSW working on SNF placement as recommended by PT/OT.  Patient is unsafe for discharge at this time due to he will be alone for multiple hours a day and severe deconditioning.     DVT prophylaxis: SCDs Code Status: Full code Family Communication: We will update significant other Disposition Plan: Continue to closely monitor, CSW working on SNF placement medically stable     [facility] Direct contact: see [email or web address] (further directions at bottom of note if needed) 7PM-7AM contact night coverage as at bottom of note 05/02/2019, 7:09 PM  LOS: 33 days   Consultants  . GI, PCCM  Procedures  .  Antibiotics  .   Interval History/Subjective  Anxious about  leaving the hospital Has no complaints  Objective   Vitals:  Vitals:   05/02/19 1404 05/02/19 1520  BP: (!) 108/58   Pulse: 79   Resp: 18   Temp: 98.7 F (37.1 C)   SpO2: 97% 97%    Exam:  Awake Alert, Oriented X 4, No new F.N deficits, Normal affect [place].AT No JVD Regular rate and rhythm, no murmurs, no rubs, no gallops, 2+ pitting edema bilateral lower extremities Normal respiratory effort on on room air +ve B.Sounds, Abd Soft, No tenderness, No organomegaly appriciated, No rebound - guarding or rigidity. No Cyanosis, Clubbing or edema, No new Rash or bruise     I have personally reviewed the following:   Data Reviewed: Basic Metabolic Panel: Recent Labs  Lab 04/26/19 0355 04/28/19 0916 04/29/19 0137 05/01/19 0641 05/02/19 0333  NA 134* 132* 131* 129* 133*  K 3.6 3.4* 3.6 3.8 3.9  CL 104 104 104 100 101  CO2 19* 19* 20* 20* 23  GLUCOSE 106* 160* 124* 99 97  BUN 15 16 15 13 13   CREATININE 0.66 0.70 0.64 0.72 0.64  CALCIUM 8.0* 8.0* 7.8* 8.1* 8.3*  MG 1.4* 1.5* 2.2 1.8  --    Liver Function Tests: No results for input(s): AST, ALT, ALKPHOS, BILITOT, PROT, ALBUMIN in the last 168 hours. No results for input(s): LIPASE, AMYLASE in the last 168 hours. Recent Labs  Lab 05/02/19 0430  AMMONIA 56*   CBC: Recent Labs  Lab 04/26/19 0355 04/28/19 0916 04/29/19 0137 04/30/19 0318 05/01/19 0641 05/02/19 0333  WBC 9.0 9.7 9.9  --  10.8* 11.4*  HGB 7.3* 7.4* 7.0* 7.1* 7.3* 7.1*  HCT 23.6* 24.0* 22.2* 22.4* 23.5* 22.6*  MCV 114.0* 113.7* 113.8*  --  113.0* 111.9*  PLT PLATELET CLUMPS NOTED ON SMEAR, UNABLE TO ESTIMATE 94* PLATELET CLUMPS NOTED ON SMEAR, COUNT APPEARS DECREASED  --  93* PLATELET CLUMPS NOTED ON SMEAR, COUNT APPEARS DECREASED   Cardiac Enzymes: No results for input(s): CKTOTAL, CKMB, CKMBINDEX, TROPONINI in the last 168 hours. BNP (last 3 results) No results for input(s): BNP in the last 8760 hours.  ProBNP (last 3 results) No results for  input(s): PROBNP in the last 8760 hours.  CBG: No results for input(s): GLUCAP in the last 168 hours.  No results found for this or any previous visit (from the past 240 hour(s)).   Studies: No results found.  Scheduled Meds: . sodium chloride   Intravenous Once  . Chlorhexidine Gluconate Cloth  6 each Topical Daily  . docusate sodium  100 mg Oral BID  . furosemide  20 mg Oral Daily  . gabapentin  200 mg Oral BID  . Gerhardt's butt cream   Topical TID  . hydrocortisone  25 mg Rectal QHS  . lactulose  10 g Oral Daily  . mouth rinse  15 mL Mouth Rinse BID  . metoprolol tartrate  12.5 mg Oral BID  . miconazole nitrate   Topical BID  . multivitamin with minerals  1 tablet Oral Daily  . pantoprazole  40 mg Oral BID  . sodium chloride flush  10-40 mL Intracatheter Q12H  . spironolactone  25 mg Oral Daily   Continuous Infusions: . sodium chloride 250 mL (04/19/19 1224)    Principal Problem:   Acute hyperactive alcohol withdrawal delirium (HCC) Active Problems:   Anxiety state   EtOH dependence (HCC)   Hypertensive heart disease without heart failure  S/P drug eluting coronary stent placement   Upper GI bleed   Acute renal failure (HCC)   Alcoholic hepatitis without ascites   Encephalopathy, hepatic (HCC)   Encounter for central line placement   Acute hypernatremia   Metabolic acidosis   Thrombocytopenia (HCC)   Left arm swelling   Hypomagnesemia   Chronic diastolic CHF (congestive heart failure) (HCC)   Macrocytic anemia   Atrial fibrillation, rapid ([place])      Desiree Hane  [facility]

## 2019-05-02 NOTE — Progress Notes (Signed)
Occupational Therapy Treatment Patient Details Name: Richard Calhoun MRN: [id number] DOB: [birth year] Today's Date: 05/02/2019    History of present illness 58 yo male admitted to ED on 9/10 for R flank pain, ETOH, N/V. Pt requiring intubation 9/13-9/18.Endoscopy reveals nonbleeding erosive gastropathy, duodenal erosions; placed feeding tube. Pt with new diagnosis LLL aspiration pneumonitis, hypoactive delirium. PMH includes anxiety, alcohol abuse, depression, GERD, HTN, HLD, tubular adenoma of colon, CAD with stent placement, PVD.   OT comments  Performed lateral scoot transfer back to bed with mod A and cues for safety.  Initiated level 2 theraband exercises for horizontal ABD and FF  Follow Up Recommendations  SNF    Equipment Recommendations  None recommended by OT    Recommendations for Other Services      Precautions / Restrictions Precautions Precautions: Fall Precaution Comments: s/p VDRF + extended LOS very deconditioned Restrictions Weight Bearing Restrictions: No       Mobility Bed Mobility Overal bed mobility: Needs Assistance Bed Mobility: Supine to Sit     Sit to supine:  Min A       Transfers  Overall transfer level: Needs assistance   See toilet transfers above            Balance     Sitting balance-Leahy Scale: Fair                                     ADL either performed or assessed with clinical judgement   ADL                           Toilet Transfer: Moderate assistance(lateral scoot chair to bed)             General ADL Comments: performed lateral scoot from drop arm recliner to bed; cues for safety; pt initiated lying down prematurely     Vision       Perception     Praxis      Cognition Arousal/Alertness: Awake/alert Behavior During Therapy: WFL for tasks assessed/performed Overall Cognitive Status: Within Functional Limits for tasks assessed                                 General Comments: AxO x 3 improving        Exercises Exercises: Other exercises Other Exercises Other Exercises: level one theraband for horizontal abd and shoulder flexion x 10 reps; assist to lengthen for less resistance and cues for form   Shoulder Instructions       General Comments      Pertinent Vitals/ Pain       Pain Assessment: Faces Faces Pain Scale: Hurts little more Pain Location: B feet (swelling) and chronic back pain Pain Descriptors / Indicators: Sore Pain Intervention(s): Limited activity within patient's tolerance;Monitored during session;Repositioned  Home Living Family/patient expects to be discharged to:: Skilled nursing facility                                        Prior Functioning/Environment              Frequency  Min 2X/week        Progress Toward Goals  OT Goals(current goals can now be found in the  care plan section)  Progress towards OT goals: Progressing toward goals     Plan      Co-evaluation                 AM-PAC OT "6 Clicks" Daily Activity     Outcome Measure   Help from another person eating meals?: A Little Help from another person taking care of personal grooming?: A Little Help from another person toileting, which includes using toliet, bedpan, or urinal?: A Lot Help from another person bathing (including washing, rinsing, drying)?: A Lot Help from another person to put on and taking off regular upper body clothing?: A Lot Help from another person to put on and taking off regular lower body clothing?: Total 6 Click Score: 13    End of Session    OT Visit Diagnosis: Unsteadiness on feet (R26.81);Pain;Cognitive communication deficit (R41.841);Muscle weakness (generalized) (M62.81) Pain - Right/Left: Left Pain - part of body: Ankle and joints of foot   Activity Tolerance Patient tolerated treatment well   Patient Left in bed;with call bell/phone within reach   Nurse Communication  ([place]:  bed alarm would not set "wt too low")        Time: [id number] OT Time Calculation (min): 20 min  Charges: OT General Charges $OT Visit: 1 Visit OT Treatments $Therapeutic Activity: 8-22 mins  Lesle Chris, OTR/L Acute Rehabilitation Services [phone] [place] pager [phone] office 05/02/2019   [name] 05/02/2019, 1:48 PM

## 2019-05-02 NOTE — Progress Notes (Signed)
Physical Therapy Treatment Patient Details Name: Richard Calhoun MRN: [id number] DOB: 1961-06-09 Today's Date: 05/02/2019    History of Present Illness 58 yo male admitted to ED on 9/10 for R flank pain, ETOH, N/V. Pt requiring intubation 9/13-9/18.Endoscopy reveals nonbleeding erosive gastropathy, duodenal erosions; placed feeding tube. Pt with new diagnosis LLL aspiration pneumonitis, hypoactive delirium. PMH includes anxiety, alcohol abuse, depression, GERD, HTN, HLD, tubular adenoma of colon, CAD with stent placement, PVD.    PT Comments    Pt awake, bright and eating spaghetti.  IV disconnected and condom cath removed.  Pt is feeling "somewhat better".  Assisted OOB required + 2 assist for safety to amb.  General bed mobility comments: HOB elevated, use of rail and increased time use of bed pad to complete scooting to EOB.  B LE edema and general deconditioning.  General transfer comment: pt more able to rise from elevated bed using B platform EVA walker and quickly "lock knees" for increased support.  Static standing x 2 min to use urinal with assist to correct posterior lean.  Also required assist to control stand to sit as pt unlocks knees they buckle. General Gait Details: + 2 side by side assist and recliner really close behind, pt was able to amb an increased distance with short shuffled steps forward but with heavy lean on EVA walker and noted B knee buckle.  VERY weak.  Positioned upright with B LE semi elevated.  Pt c/o back pain when "in chair too long".   Follow Up Recommendations  SNF     Equipment Recommendations  None recommended by PT    Recommendations for Other Services       Precautions / Restrictions Precautions Precautions: Fall Precaution Comments: s/p VDRF + extended LOS very deconditioned Restrictions Weight Bearing Restrictions: No    Mobility  Bed Mobility Overal bed mobility: Needs Assistance Bed Mobility: Supine to Sit     Supine to sit: Min assist      General bed mobility comments: HOB elevated, use of rail and increased time use of bed pad to complete scooting to EOB.  B LE edema and general deconditioning  Transfers Overall transfer level: Needs assistance Equipment used: Bilateral platform walker Transfers: Sit to/from Stand;Stand Pivot Transfers Sit to Stand: Max assist;+2 physical assistance;+2 safety/equipment Stand pivot transfers: Max assist;Total assist;+2 physical assistance       General transfer comment: pt more able to rise from elevated bed using B platform EVA walker and quickly "lock knees" for increased support.  Static standing x 2 min to use urinal with assist to correct posterior lean.  Also required assist to control stand to sit as pt unlocks knees they buckle.  Ambulation/Gait Ambulation/Gait assistance: Max assist;Total assist;+2 physical assistance;+2 safety/equipment Gait Distance (Feet): 32 Feet Assistive device: Bilateral platform walker Gait Pattern/deviations: Step-to pattern;Shuffle Gait velocity: decreased   General Gait Details: + 2 side by side assist and recliner really close behind, pt was able to amb an increased distance with short shuffled steps forward but with heavy lean on EVA walker and noted B knee buckle.  VERY weak.   Stairs             Wheelchair Mobility    Modified Rankin (Stroke Patients Only)       Balance  Cognition Arousal/Alertness: Awake/alert Behavior During Therapy: WFL for tasks assessed/performed Overall Cognitive Status: Within Functional Limits for tasks assessed                                 General Comments: AxO x 3 improving      Exercises      General Comments        Pertinent Vitals/Pain Pain Assessment: Faces Faces Pain Scale: Hurts little more Pain Location: B feet (swelling) and chronic back pain Pain Descriptors / Indicators: Grimacing;Guarding Pain  Intervention(s): Monitored during session;Repositioned    Home Living                      Prior Function            PT Goals (current goals can now be found in the care plan section) Progress towards PT goals: Progressing toward goals    Frequency    Min 2X/week      PT Plan Current plan remains appropriate    Co-evaluation              AM-PAC PT "6 Clicks" Mobility   Outcome Measure  Help needed turning from your back to your side while in a flat bed without using bedrails?: A Lot Help needed moving from lying on your back to sitting on the side of a flat bed without using bedrails?: A Lot Help needed moving to and from a bed to a chair (including a wheelchair)?: Total Help needed standing up from a chair using your arms (e.g., wheelchair or bedside chair)?: Total Help needed to walk in hospital room?: Total Help needed climbing 3-5 steps with a railing? : Total 6 Click Score: 8    End of Session Equipment Utilized During Treatment: Gait belt Activity Tolerance: Patient limited by pain;Patient limited by fatigue;Other (comment) Patient left: in chair;with call bell/phone within reach;with chair alarm set Nurse Communication: Need for lift equipment;Other (comment)(perfrom lateral scoot) PT Visit Diagnosis: Muscle weakness (generalized) (M62.81);Other abnormalities of gait and mobility (R26.89)     Time: [id number] PT Time Calculation (min) (ACUTE ONLY): 25 min  Charges:  $Gait Training: 8-22 mins $Therapeutic Activity: 8-22 mins                     Rica Koyanagi  PTA Acute  Rehabilitation Services Pager      [phone] Office      [phone]

## 2019-05-03 LAB — BASIC METABOLIC PANEL
Anion gap: 11 (ref 5–15)
BUN: 12 mg/dL (ref 6–20)
CO2: 21 mmol/L — ABNORMAL LOW (ref 22–32)
Calcium: 8.5 mg/dL — ABNORMAL LOW (ref 8.9–10.3)
Chloride: 99 mmol/L (ref 98–111)
Creatinine, Ser: 0.69 mg/dL (ref 0.61–1.24)
GFR calc Af Amer: >60 mL/min (ref >60)
GFR calc non Af Amer: >60 mL/min (ref >60)
Glucose, Bld: 96 mg/dL (ref 70–99)
Potassium: 4 mmol/L (ref 3.5–5.1)
Sodium: 131 mmol/L — ABNORMAL LOW (ref 135–145)

## 2019-05-03 LAB — CBC
HCT: 23.2 % — ABNORMAL LOW (ref 39.0–52.0)
Hemoglobin: 7.2 g/dL — ABNORMAL LOW (ref 13.0–17.0)
MCH: 34.1 pg — ABNORMAL HIGH (ref 26.0–34.0)
MCHC: 31 g/dL (ref 30.0–36.0)
MCV: 110 fL — ABNORMAL HIGH (ref 80.0–100.0)
Platelets: DECREASED 10*3/uL (ref 150–400)
RBC: 2.11 MIL/uL — ABNORMAL LOW (ref 4.22–5.81)
RDW: 16.8 % — ABNORMAL HIGH (ref 11.5–15.5)
WBC: 11.3 10*3/uL — ABNORMAL HIGH (ref 4.0–10.5)
nRBC: 0 % (ref 0.0–0.2)

## 2019-05-03 NOTE — Progress Notes (Signed)
Physical Therapy Treatment Patient Details Name: Richard Calhoun MRN: [id number] DOB: [birth year] Today's Date: 05/03/2019    History of Present Illness 58 yo male admitted to ED on 9/10 for R flank pain, ETOH, N/V. Pt requiring intubation 9/13-9/18.Endoscopy reveals nonbleeding erosive gastropathy, duodenal erosions; placed feeding tube. Pt with new diagnosis LLL aspiration pneumonitis, hypoactive delirium. PMH includes anxiety, alcohol abuse, depression, GERD, HTN, HLD, tubular adenoma of colon, CAD with stent placement, PVD.    PT Comments    Pt progressing.  Assisted OOB to [facility].  General bed mobility comments: HOB elevated, use of rail and increased time use of bed pad to complete scooting to EOB.  B LE edema and general deconditioning.  General transfer comment: increased ability to self rise for brief time as B knees buckle.  Assisted from elevated bed to [facility] 1/4 partial pivot with imcomplete turn due to B LE weakness and c/o "achy/numb" feet.  Required + 2 side by side assist to rise from lower commode level.  VC's to "lock knees" once upright. General Gait Details: + 2 side by side assist and recliner really close behind, pt was able to amb an increased distance with short shuffled steps forward but with heavy lean on EVA walker and noted B knee buckle.  VERY weak. Consulted with OT if pt would benefit from npt Rehab.  Will also consult LPT.    Follow Up Recommendations  SNF;CIR     Equipment Recommendations  None recommended by PT    Recommendations for Other Services       Precautions / Restrictions Precautions Precautions: Fall Precaution Comments: s/p VDRF + extended LOS very deconditioned Restrictions Weight Bearing Restrictions: No    Mobility  Bed Mobility Overal bed mobility: Needs Assistance Bed Mobility: Supine to Sit     Supine to sit: Min assist     General bed mobility comments: HOB elevated, use of rail and increased time use of bed pad to complete scooting  to EOB.  B LE edema and general deconditioning  Transfers Overall transfer level: Needs assistance Equipment used: Bilateral platform walker;None Transfers: Sit to/from [employer] Transfers Sit to Stand: Mod assist;+2 physical assistance;+2 safety/equipment;From elevated surface Stand pivot transfers: Mod assist;+2 physical assistance;+2 safety/equipment;From elevated surface;Max assist       General transfer comment: increased ability to self rise for brief time as B knees buckle.  Assisted from elevated bed to [facility] 1/4 partial pivot with imcomplete turn due to B LE weakness and c/o "achy/numb" feet.  Required + 2 side by side assist to rise from lower commode level.  VC's to "lock knees" once upright.  Ambulation/Gait Ambulation/Gait assistance: Mod assist Gait Distance (Feet): 38 Feet Assistive device: Bilateral platform walker(EVA walker) Gait Pattern/deviations: Step-to pattern;Shuffle Gait velocity: decreased   General Gait Details: + 2 side by side assist and recliner really close behind, pt was able to amb an increased distance with short shuffled steps forward but with heavy lean on EVA walker and noted B knee buckle.  VERY weak.   Stairs             Wheelchair Mobility    Modified Rankin (Stroke Patients Only)       Balance                                            Cognition Arousal/Alertness: Awake/alert Behavior During Therapy:  WFL for tasks assessed/performed Overall Cognitive Status: Within Functional Limits for tasks assessed                                 General Comments: AxO x 3 improving      Exercises      General Comments        Pertinent Vitals/Pain Pain Assessment: Faces Faces Pain Scale: Hurts a little bit Pain Location: B feet (swelling) and chronic back pain Pain Descriptors / Indicators: Aching Pain Intervention(s): Monitored during session    Home Living                       Prior Function            PT Goals (current goals can now be found in the care plan section) Progress towards PT goals: Progressing toward goals    Frequency    Min 2X/week      PT Plan Current plan remains appropriate    Co-evaluation              AM-PAC PT "6 Clicks" Mobility   Outcome Measure  Help needed turning from your back to your side while in a flat bed without using bedrails?: A Lot Help needed moving from lying on your back to sitting on the side of a flat bed without using bedrails?: A Lot Help needed moving to and from a bed to a chair (including a wheelchair)?: Total Help needed standing up from a chair using your arms (e.g., wheelchair or bedside chair)?: Total Help needed to walk in hospital room?: Total Help needed climbing 3-5 steps with a railing? : Total 6 Click Score: 8    End of Session Equipment Utilized During Treatment: Gait belt Activity Tolerance: Patient limited by fatigue Patient left: in chair;with call bell/phone within reach;with chair alarm set Nurse Communication: Need for lift equipment;Other (comment)(NT instructed to perform lateral scoot back to bed) PT Visit Diagnosis: Muscle weakness (generalized) (M62.81);Other abnormalities of gait and mobility (R26.89)     Time: [id number] PT Time Calculation (min) (ACUTE ONLY): 40 min  Charges:  $Gait Training: 8-22 mins $Therapeutic Activity: 23-37 mins                     Rica Koyanagi  PTA Acute  Rehabilitation Services Pager      [phone] Office      [phone]

## 2019-05-03 NOTE — TOC Progression Note (Addendum)
Transition of Care [facility]) - Progression Note    Patient Details  Name: Richard Calhoun MRN: [id number] Date of Birth: [birth year]  Transition of Care [facility]) CM/SW Contact  Leeroy Cha, RN Phone Number: 05/03/2019, 8:58 AM  Clinical Narrative:    Tcf=-Paula Noice F. Counseling-need fl2 on patient to send to case worker at [facility]. [phone] email with [place] sent to [facility].  Expected Discharge Plan: Skilled Nursing Facility Barriers to Discharge: Continued Medical Work up, Inadequate or no insurance  Expected Discharge Plan and Services Expected Discharge Plan: [place] In-house Referral: Clinical Social Work Discharge Planning Services: CM Consult Post Acute Care Choice: [place] Living arrangements for the past 2 months: Single Family Home Expected Discharge Date: (unknown)                                     Social Determinants of Health (SDOH) Interventions    Readmission Risk Interventions Readmission Risk Prevention Plan 04/04/2019  Transportation Screening Complete  PCP or Specialist Appt within 3-5 Days Not Complete  Not Complete comments Not ready for dc  HRI or [place] Not Complete  HRI or Home Care Consult comments NA  Social Work Consult for [place] Planning/Counseling Not Complete  SW consult not completed comments Pt confused  Palliative Care Screening Not Applicable  Medication Review [employer] photographer) Complete  Some recent data might be hidden

## 2019-05-03 NOTE — Progress Notes (Signed)
[facility]  PROGRESS NOTE  QUINTRELL HAMLETT [id number] DOB: [birth year] DOA: 03/30/2019 PCP: Charlott Rakes, MD  Brief History    VICKIE BAYE is a 58 y.o. year old male with medical history significant for h/o chronic alcohol abuse, chronic tobacco abuse, CAD status post angioplasty, CHF, dyslipidemia, PVD and paroxysmal tachycardia  who presented on 03/30/2019 with c/ohematemesis and abdominal pain.He had poor oral intake for last 3 days prior to hospitalization. He had worsening left flank pain, and unwitnessed fall, nausea and vomiting for several days.  Patient was admitted with diagnosis of hematemesis due to upper GI bleed medically treated with IV octreotide/IV pantoprazole.  Patient was ultimately intubated due to worsening mentation on 9/13.  Underwent EGD (9/16) found to have esophageal varices with portal hypertensive gastropathy.  Patient was extubated on 9/18.  Patient continued to have encephalopathic symptoms presumed initially to be withdrawal symptoms however this is outside of the time range for alcohol withdrawal presumed to be related to ICU acquired delirium.  Course also complicated by new onset atrial fibrillation with RVR.  He required enteral nutrition with tube feeds starting on 9/21 was able to transition to dysphagia 3 diet after speech therapy evaluation.  Patient currently medically stable but significantly weak and debilitated CSW is assisting with SNF placement  A & P     Acute hypoxic respiratory failure likely secondary left lower lobe aspiration pneumonitis, resolved.  On room air, normal respiratory effort.  Continue dysphagia 3 diet per speech therapy recommendations.   Alcohol hepatitis, cirrhosis.  Supportive care for hepatitis during hospital stay.  Cirrhosis seems compensated now does have 2+ edema lower extremity likely related to position/hypoalbuminemia.  Continue spironolactone, Lasix   Hepatic encephalopathy, resolved.  Alert and oriented  x3 today, continue lactulose therapy, closely monitor BM output and mental status.  No asterixis present.   Atrial fibrillation, normal sinus rhythm, rate controlled.  Continue Lopressor.  Not a candidate for anticoagulation due to falls related to ongoing alcohol use especially in setting of known GI bleeding risks with esophageal varices   Thrombocytopenia, related to alcohol abuse/hepatic steatosis/alcoholic hepatitis.  Clumping on cbc.  No active signs of bleeding, continue to closely monitor.   Acute on chronic anemia, stable.  Worse in the setting of upper GI bleed related to esophageal varices/portal hypertensive gastropathy on EGD on admission.  Hemoglobin remained stable, no active signs of bleeding, continue to closely monitor.   Type 2 diabetes. A1c 5%  Closely monitor, holding off on long-acting insulin given fasting glucose within normal limits, sliding scale as needed   Pancreatic lesion will need outpatient MRI/MRCP for repeat imaging given incidental finding on CT abdomen on admission   Alcohol abuse.  Outside window for withdrawal.  Alcohol cessation emphasized.   Hypokalemia/hypomagnesemia, repleted.   Severe debility/deconditioningRelated to ICU stay, prolonged hospitalization.  Acute illness.  CSW working on SNF placement as recommended by PT/OT.  Patient is unsafe for discharge at this time due to he will be alone for multiple hours a day and severe deconditioning. Inpatient rehab consult per PT/OT rcs     DVT prophylaxis: SCDs Code Status: Full code Family Communication: We will update significant other Disposition Plan: Continue to closely monitor, Inpatient rehab consult per PT/OT rcs     [facility] Direct contact: see [email or web address] (further directions at bottom of note if needed) 7PM-7AM contact night coverage as at bottom of note 05/03/2019, 8:50 PM  LOS: 34 days   Consultants  . GI, PCCM  Procedures  .   Antibiotics  .   Interval  History/Subjective  No complaints Ate breakfast ok  Objective   Vitals:  Vitals:   05/03/19 0545 05/03/19 1334  BP: 110/61 (!) 90/57  Pulse: 88 90  Resp: 19 17  Temp: 99 F (37.2 C)   SpO2: 97% 98%    Exam:  Awake Alert, Oriented X 4, No new F.N deficits, Normal affect No appreciableJVD Regular rate and rhythm, no murmurs, no rubs, no gallops, 2+ pitting pedal edema, stockings in place Normal respiratory effort on on room air +ve B.Sounds, Abd Soft, No tenderness,  No rebound - guarding or rigidity.    I have personally reviewed the following:   Data Reviewed: Basic Metabolic Panel: Recent Labs  Lab 04/28/19 0916 04/29/19 0137 05/01/19 0641 05/02/19 0333 05/03/19 0557  NA 132* 131* 129* 133* 131*  K 3.4* 3.6 3.8 3.9 4.0  CL 104 104 100 101 99  CO2 19* 20* 20* 23 21*  GLUCOSE 160* 124* 99 97 96  BUN 16 15 13 13 12   CREATININE 0.70 0.64 0.72 0.64 0.69  CALCIUM 8.0* 7.8* 8.1* 8.3* 8.5*  MG 1.5* 2.2 1.8  --   --    Liver Function Tests: No results for input(s): AST, ALT, ALKPHOS, BILITOT, PROT, ALBUMIN in the last 168 hours. No results for input(s): LIPASE, AMYLASE in the last 168 hours. Recent Labs  Lab 05/02/19 0430  AMMONIA 56*   CBC: Recent Labs  Lab 04/28/19 0916 04/29/19 0137 04/30/19 0318 05/01/19 0641 05/02/19 0333 05/03/19 0557  WBC 9.7 9.9  --  10.8* 11.4* 11.3*  HGB 7.4* 7.0* 7.1* 7.3* 7.1* 7.2*  HCT 24.0* 22.2* 22.4* 23.5* 22.6* 23.2*  MCV 113.7* 113.8*  --  113.0* 111.9* 110.0*  PLT 94* PLATELET CLUMPS NOTED ON SMEAR, COUNT APPEARS DECREASED  --  93* PLATELET CLUMPS NOTED ON SMEAR, COUNT APPEARS DECREASED PLATELET CLUMPS NOTED ON SMEAR, COUNT APPEARS DECREASED   Cardiac Enzymes: No results for input(s): CKTOTAL, CKMB, CKMBINDEX, TROPONINI in the last 168 hours. BNP (last 3 results) No results for input(s): BNP in the last 8760 hours.  ProBNP (last 3 results) No results for input(s): PROBNP in the last 8760 hours.  CBG: No  results for input(s): GLUCAP in the last 168 hours.  No results found for this or any previous visit (from the past 240 hour(s)).   Studies: No results found.  Scheduled Meds: . sodium chloride   Intravenous Once  . Chlorhexidine Gluconate Cloth  6 each Topical Daily  . docusate sodium  100 mg Oral BID  . furosemide  20 mg Oral Daily  . gabapentin  200 mg Oral BID  . Gerhardt's butt cream   Topical TID  . hydrocortisone  25 mg Rectal QHS  . lactulose  10 g Oral Daily  . mouth rinse  15 mL Mouth Rinse BID  . metoprolol tartrate  12.5 mg Oral BID  . miconazole nitrate   Topical BID  . multivitamin with minerals  1 tablet Oral Daily  . pantoprazole  40 mg Oral BID  . sodium chloride flush  10-40 mL Intracatheter Q12H  . spironolactone  25 mg Oral Daily   Continuous Infusions: . sodium chloride 250 mL (04/19/19 1224)    Principal Problem:   Acute hyperactive alcohol withdrawal delirium (HCC) Active Problems:   Anxiety state   EtOH dependence ([place])   Hypertensive heart disease without heart failure   S/P drug eluting coronary stent placement  Upper GI bleed   Acute renal failure (HCC)   Alcoholic hepatitis without ascites   Acute hepatic encephalopathy   Encounter for central line placement   Acute hypernatremia   Metabolic acidosis   Thrombocytopenia (HCC)   Left arm swelling   Hypomagnesemia   Chronic diastolic CHF (congestive heart failure) (HCC)   Macrocytic anemia   Unspecified atrial fibrillation (HCC)   Iron deficiency anemia due to chronic blood loss   Pancreatic lesion      Desiree Hane  [facility]

## 2019-05-04 LAB — PREPARE RBC (CROSSMATCH)

## 2019-05-04 LAB — BASIC METABOLIC PANEL
Anion gap: 7 (ref 5–15)
BUN: 11 mg/dL (ref 6–20)
CO2: 23 mmol/L (ref 22–32)
Calcium: 8.2 mg/dL — ABNORMAL LOW (ref 8.9–10.3)
Chloride: 102 mmol/L (ref 98–111)
Creatinine, Ser: 0.92 mg/dL (ref 0.61–1.24)
GFR calc Af Amer: >60 mL/min (ref >60)
GFR calc non Af Amer: >60 mL/min (ref >60)
Glucose, Bld: 105 mg/dL — ABNORMAL HIGH (ref 70–99)
Potassium: 3.8 mmol/L (ref 3.5–5.1)
Sodium: 132 mmol/L — ABNORMAL LOW (ref 135–145)

## 2019-05-04 LAB — CBC
HCT: 22.8 % — ABNORMAL LOW (ref 39.0–52.0)
Hemoglobin: 7.1 g/dL — ABNORMAL LOW (ref 13.0–17.0)
MCH: 34.8 pg — ABNORMAL HIGH (ref 26.0–34.0)
MCHC: 31.1 g/dL (ref 30.0–36.0)
MCV: 111.8 fL — ABNORMAL HIGH (ref 80.0–100.0)
Platelets: UNDETERMINED 10*3/uL (ref 150–400)
RBC: 2.04 MIL/uL — ABNORMAL LOW (ref 4.22–5.81)
RDW: 16.8 % — ABNORMAL HIGH (ref 11.5–15.5)
WBC: 10.4 10*3/uL (ref 4.0–10.5)
nRBC: 0 % (ref 0.0–0.2)

## 2019-05-04 MED ORDER — SODIUM CHLORIDE 0.9% IV SOLUTION: Freq: Once | INTRAVENOUS | Status: DC

## 2019-05-04 NOTE — Progress Notes (Signed)
[facility]  PROGRESS NOTE  Richard Calhoun [id number] DOB: 1961-02-07 DOA: 03/30/2019 PCP: Charlott Rakes, MD  Brief History    Richard Calhoun is a 58 y.o. year old male with medical history significant for h/o chronic alcohol abuse, chronic tobacco abuse, CAD status post angioplasty, CHF, dyslipidemia, PVD and paroxysmal tachycardia  who presented on 03/30/2019 with c/ohematemesis and abdominal pain.He had poor oral intake for last 3 days prior to hospitalization. He had worsening left flank pain, and unwitnessed fall, nausea and vomiting for several days.  Patient was admitted with diagnosis of hematemesis due to upper GI bleed medically treated with IV octreotide/IV pantoprazole.  Patient was ultimately intubated due to worsening mentation on 9/13.  Underwent EGD (9/16) found to have esophageal varices with portal hypertensive gastropathy.  Patient was extubated on 9/18.  Patient continued to have encephalopathic symptoms presumed initially to be withdrawal symptoms however this is outside of the time range for alcohol withdrawal presumed to be related to ICU acquired delirium.  Course also complicated by new onset atrial fibrillation with RVR.  He required enteral nutrition with tube feeds starting on 9/21 was able to transition to dysphagia 3 diet after speech therapy evaluation.  Patient currently medically stable but significantly weak and debilitated CSW is assisting with SNF placement  A & P     Acute hypoxic respiratory failure likely secondary left lower lobe aspiration pneumonitis, resolved.  On room air, normal respiratory effort.  Continue dysphagia 3 diet per speech therapy recommendations.   Alcohol hepatitis, cirrhosis.  Supportive care for hepatitis during hospital stay.  Cirrhosis seems compensated now does have 2+ edema lower extremity likely related to position/hypoalbuminemia.  Continue spironolactone, Lasix   Hepatic encephalopathy, resolved.  Alert and oriented  x3 today, continue lactulose therapy, closely monitor BM output and mental status.  No asterixis present.   Atrial fibrillation, normal sinus rhythm, rate controlled.  Continue Lopressor.  Not a candidate for anticoagulation due to falls related to ongoing alcohol use especially in setting of known GI bleeding risks with esophageal varices   Thrombocytopenia, related to alcohol abuse/hepatic steatosis/alcoholic apicitis.  CBC keeps showing clumps,   No active signs of bleeding, continue to closely monitor.   Acute on chronic anemia, stable.  Worse in the setting of upper GI bleed related to esophageal varices/portal hypertensive gastropathy on EGD on admission.  Iron consistent with anemia of chronic disease. hemoglobin remained stable, no active signs of bleeding, continue to closely monitor.   Type 2 diabetes.  Within normal limits.  No insulin   Pancreatic lesion will need outpatient MRI/MRCP for repeat imaging given incidental finding on CT abdomen on admission   Alcohol abuse.  Outside window for withdrawal.  Alcohol cessation emphasized.   Hypokalemia/hypomagnesemia, repleted.   Severe debility/deconditioningRelated to ICU stay, prolonged hospitalization.  Acute illness.  CSW working on SNF placement as recommended by PT/OT.  Patient is unsafe for discharge at this time due to he will be alone for multiple hours a day and severe deconditioning.     DVT prophylaxis: SCDs Code Status: Full code Family Communication: We will update significant other Disposition Plan: Continue to closely monitor, CSW working on SNF placement medically stable, currently undergoing inpatient rehab evaluation     [facility] Direct contact: see [email or web address] (further directions at bottom of note if needed) 7PM-7AM contact night coverage as at bottom of note 05/04/2019, 2:38 PM  LOS: 35 days   Consultants  . GI, PCCM  Procedures  .  Antibiotics  .   Interval History/Subjective  No  complaints Denies abdominal pain Eating well  Objective   Vitals:  Vitals:   05/04/19 0657 05/04/19 1343  BP: 124/68 108/60  Pulse: 87 83  Resp: 18 18  Temp: 98.3 F (36.8 C) 98 F (36.7 C)  SpO2: 97% 97%    Exam:  Awake Alert, Oriented X 4, No new F.N deficits, Normal affect Regular rate and rhythm, no murmurs, no rubs, no gallops, 1+ pitting edema bilateral lower extremities, stockings in place Normal respiratory effort on on room air +ve B.Sounds, Abd Soft, No tenderness, , No rebound - guarding or rigidity.      I have personally reviewed the following:   Data Reviewed: Basic Metabolic Panel: Recent Labs  Lab 04/28/19 0916 04/29/19 0137 05/01/19 0641 05/02/19 0333 05/03/19 0557 05/04/19 0343  NA 132* 131* 129* 133* 131* 132*  K 3.4* 3.6 3.8 3.9 4.0 3.8  CL 104 104 100 101 99 102  CO2 19* 20* 20* 23 21* 23  GLUCOSE 160* 124* 99 97 96 105*  BUN 16 15 13 13 12 11   CREATININE 0.70 0.64 0.72 0.64 0.69 0.92  CALCIUM 8.0* 7.8* 8.1* 8.3* 8.5* 8.2*  MG 1.5* 2.2 1.8  --   --   --    Liver Function Tests: No results for input(s): AST, ALT, ALKPHOS, BILITOT, PROT, ALBUMIN in the last 168 hours. No results for input(s): LIPASE, AMYLASE in the last 168 hours. Recent Labs  Lab 05/02/19 0430  AMMONIA 56*   CBC: Recent Labs  Lab 04/29/19 0137 04/30/19 0318 05/01/19 0641 05/02/19 0333 05/03/19 0557 05/04/19 0343  WBC 9.9  --  10.8* 11.4* 11.3* 10.4  HGB 7.0* 7.1* 7.3* 7.1* 7.2* 7.1*  HCT 22.2* 22.4* 23.5* 22.6* 23.2* 22.8*  MCV 113.8*  --  113.0* 111.9* 110.0* 111.8*  PLT PLATELET CLUMPS NOTED ON SMEAR, COUNT APPEARS DECREASED  --  93* PLATELET CLUMPS NOTED ON SMEAR, COUNT APPEARS DECREASED PLATELET CLUMPS NOTED ON SMEAR, COUNT APPEARS DECREASED PLATELET CLUMPS NOTED ON SMEAR, UNABLE TO ESTIMATE   Cardiac Enzymes: No results for input(s): CKTOTAL, CKMB, CKMBINDEX, TROPONINI in the last 168 hours. BNP (last 3 results) No results for input(s): BNP in the  last 8760 hours.  ProBNP (last 3 results) No results for input(s): PROBNP in the last 8760 hours.  CBG: No results for input(s): GLUCAP in the last 168 hours.  No results found for this or any previous visit (from the past 240 hour(s)).   Studies: No results found.  Scheduled Meds: . sodium chloride   Intravenous Once  . Chlorhexidine Gluconate Cloth  6 each Topical Daily  . docusate sodium  100 mg Oral BID  . furosemide  20 mg Oral Daily  . gabapentin  200 mg Oral BID  . Gerhardt's butt cream   Topical TID  . hydrocortisone  25 mg Rectal QHS  . lactulose  10 g Oral Daily  . mouth rinse  15 mL Mouth Rinse BID  . metoprolol tartrate  12.5 mg Oral BID  . miconazole nitrate   Topical BID  . multivitamin with minerals  1 tablet Oral Daily  . pantoprazole  40 mg Oral BID  . sodium chloride flush  10-40 mL Intracatheter Q12H  . spironolactone  25 mg Oral Daily   Continuous Infusions: . sodium chloride 250 mL (04/19/19 1224)    Principal Problem:   Acute hyperactive alcohol withdrawal delirium (HCC) Active Problems:   Anxiety state  EtOH dependence ([place])   Hypertensive heart disease without heart failure   S/P drug eluting coronary stent placement   Upper GI bleed   Acute renal failure (HCC)   Alcoholic hepatitis without ascites   Acute hepatic encephalopathy   Encounter for central line placement   Acute hypernatremia   Metabolic acidosis   Thrombocytopenia (HCC)   Left arm swelling   Hypomagnesemia   Chronic diastolic CHF (congestive heart failure) (HCC)   Macrocytic anemia   Unspecified atrial fibrillation (HCC)   Iron deficiency anemia due to chronic blood loss   Pancreatic lesion      Desiree Hane  [facility]

## 2019-05-04 NOTE — Progress Notes (Signed)
Physical Therapy Treatment Patient Details Name: Richard Calhoun MRN: [id number] DOB: 1961-06-09 Today's Date: 05/04/2019    History of Present Illness 58 yo male admitted to ED on 9/10 for R flank pain, ETOH, N/V. Pt requiring intubation 9/13-9/18.Endoscopy reveals nonbleeding erosive gastropathy, duodenal erosions; placed feeding tube. Pt with new diagnosis LLL aspiration pneumonitis, hypoactive delirium. PMH includes anxiety, alcohol abuse, depression, GERD, HTN, HLD, tubular adenoma of colon, CAD with stent placement, PVD.    PT Comments    Pt expressing worry about his outcome and plans after hospital.  "I don't have any insurance".  Pt also getting more frustrated with how "slow" his progress is and the long days spends alone in room.   Pt is progressing with his mobility but it is slow going.  Assisted OOB.  General bed mobility comments: HOB elevated, use of rail and increased time but more able to self perform.  General transfer comment: increased self ability to rise with 25% VC's to push off from bed/recliner vs pull from [employer]. General Gait Details: First amb with EVA walker pt was able to increased distance to 75 feet with second assist to follow with recliner.  Second amb with RW only 12 feet with Min Assist with increased instability, decreased speed and increased effort. Pt asked if he could go home vs Rehab.  Explained he would need 24/7 care and that he has "a long way to go".  Pt asked that I call Richard Calhoun and update her on his Physical Therapy.  Spoke to [place] via phone about pt's slow progress.  Pt will need Rehab prior to safely returning home .   Follow Up Recommendations  CIR would be ideal    Equipment Recommendations  None recommended by PT    Recommendations for Other Services       Precautions / Restrictions Precautions Precautions: Fall Precaution Comments: s/p VDRF + extended LOS in ICU Restrictions Weight Bearing Restrictions: No    Mobility  Bed  Mobility Overal bed mobility: Needs Assistance Bed Mobility: Supine to Sit     Supine to sit: Min guard;Min assist     General bed mobility comments: HOB elevated, use of rail and increased time but more able to self perform  Transfers Overall transfer level: Needs assistance Equipment used: Bilateral platform walker;None Transfers: Sit to/from Stand Sit to Stand: Mod assist;Min assist;+2 safety/equipment         General transfer comment: increased self ability to rise with 25% VC's to push off from bed/recliner vs pull from [employer].  Ambulation/Gait Ambulation/Gait assistance: Min assist;+2 safety/equipment(recliner to follow due to B knee buckle) Gait Distance (Feet): 75 Feet Assistive device: Bilateral platform walker;Rolling walker (2 wheeled) Gait Pattern/deviations: Step-through pattern;Shuffle Gait velocity: decreased   General Gait Details: First amb with EVA walker pt was able to increased distance to 75 feet with second assist to follow with recliner.  Second amb with RW only 12 feet with Min Assist with increased instability, decreased speed and increased effort.   Stairs             Wheelchair Mobility    Modified Rankin (Stroke Patients Only)       Balance                                            Cognition Arousal/Alertness: Awake/alert Behavior During Therapy: WFL for tasks assessed/performed  Overall Cognitive Status: Within Functional Limits for tasks assessed                                 General Comments: AxO x 3 and expressed concern about having no insurance and concerns about "where" he is going from here      Exercises      General Comments        Pertinent Vitals/Pain Pain Assessment: Faces Faces Pain Scale: Hurts a little bit Pain Location: B feet (swelling) and chronic back pain Pain Descriptors / Indicators: Aching    Home Living                      Prior Function             PT Goals (current goals can now be found in the care plan section) Progress towards PT goals: Progressing toward goals    Frequency    Min 2X/week      PT Plan Current plan remains appropriate    Co-evaluation              AM-PAC PT "6 Clicks" Mobility   Outcome Measure  Help needed turning from your back to your side while in a flat bed without using bedrails?: A Little Help needed moving from lying on your back to sitting on the side of a flat bed without using bedrails?: A Little Help needed moving to and from a bed to a chair (including a wheelchair)?: A Little Help needed standing up from a chair using your arms (e.g., wheelchair or bedside chair)?: A Little Help needed to walk in hospital room?: A Lot Help needed climbing 3-5 steps with a railing? : Total 6 Click Score: 15    End of Session Equipment Utilized During Treatment: Gait belt Activity Tolerance: Patient limited by fatigue Patient left: in chair;with call bell/phone within reach;with chair alarm set   PT Visit Diagnosis: Muscle weakness (generalized) (M62.81);Other abnormalities of gait and mobility (R26.89)     Time: [id number] PT Time Calculation (min) (ACUTE ONLY): 26 min  Charges:  $Gait Training: 8-22 mins $Therapeutic Activity: 8-22 mins                     Rica Koyanagi  PTA Acute  Rehabilitation Services Pager      [phone] Office      [phone]

## 2019-05-04 NOTE — Progress Notes (Signed)
Occupational Therapy Treatment Patient Details Name: Richard Calhoun MRN: [id number] DOB: Nov 04, 1960 Today's Date: 05/04/2019    History of present illness 58 yo male admitted to ED on 9/10 for R flank pain, ETOH, N/V. Pt requiring intubation 9/13-9/18.Endoscopy reveals nonbleeding erosive gastropathy, duodenal erosions; placed feeding tube. Pt with new diagnosis LLL aspiration pneumonitis, hypoactive delirium. PMH includes anxiety, alcohol abuse, depression, GERD, HTN, HLD, tubular adenoma of colon, CAD with stent placement, PVD.   OT comments  Pt very motivated; performed lateral scoot back to bed with min A; grooming and UB dressing.  Pt working with level 2 theraband with one cue for each movement  Follow Up Recommendations  CIR if pt meets criteria. He is very motivated.  SNF, if he doesn't qualify   Equipment Recommendations  None recommended by OT    Recommendations for Other Services      Precautions / Restrictions Precautions Precautions: Fall Precaution Comments: s/p VDRF + extended LOS in ICU Restrictions Weight Bearing Restrictions: No       Mobility Bed Mobility Overal bed mobility: Needs Assistance Bed Mobility: Supine to Sit     Supine to sit: Min guard;Min assist     General bed mobility comments: HOB elevated, use of rail and increased time but more able to self perform  Transfers Overall transfer level: Needs assistance Equipment used: Bilateral platform walker;None Transfers: Sit to/from Stand Sit to Stand: Mod assist;Min assist;+2 safety/equipment         General transfer comment: increased self ability to rise with 25% VC's to push off from bed/recliner vs pull from [employer].    Balance                                           ADL either performed or assessed with clinical judgement   ADL       Grooming: Oral care;Set up;Sitting           Upper Body Dressing : Minimal assistance       Toilet Transfer:  Minimal assistance(lateral pivot to bed from drop arm chair)             General ADL Comments: pt needed gown changed after oral care     Vision       Perception     Praxis      Cognition Arousal/Alertness: Awake/alert Behavior During Therapy: WFL for tasks assessed/performed Overall Cognitive Status: Within Functional Limits for tasks assessed                                 General Comments: AxO x 3 and expressed concern about having no insurance and concerns about "where" he is going from here        Exercises Other Exercises Other Exercises: level 2 theraband FF and horizontal ABD x 10 reps    Shoulder Instructions       General Comments      Pertinent Vitals/ Pain       Pain Assessment: Faces Faces Pain Scale: No hurt Pain Location: B feet (swelling) and chronic back pain Pain Descriptors / Indicators: [place]  Prior Functioning/Environment              Frequency           Progress Toward Goals  OT Goals(current goals can now be found in the care plan section)  Progress towards OT goals: Progressing toward goals     Plan      Co-evaluation                 AM-PAC OT "6 Clicks" Daily Activity     Outcome Measure   Help from another person eating meals?: A Little Help from another person taking care of personal grooming?: A Little Help from another person toileting, which includes using toliet, bedpan, or urinal?: A Lot Help from another person bathing (including washing, rinsing, drying)?: A Lot Help from another person to put on and taking off regular upper body clothing?: A Little Help from another person to put on and taking off regular lower body clothing?: Total 6 Click Score: 14    End of Session    OT Visit Diagnosis: Unsteadiness on feet (R26.81);Pain;Cognitive communication deficit (R41.841);Muscle weakness (generalized) (M62.81)    Activity Tolerance     Patient Left     Nurse Communication          Time: [id number] OT Time Calculation (min): 24 min  Charges: OT General Charges $OT Visit: 1 Visit OT Treatments $Self Care/Home Management : 8-22 mins $Therapeutic Exercise: 8-22 mins  Lesle Chris, OTR/L Acute Rehabilitation Services [phone] [facility] pager [phone] office 05/04/2019   [place] 05/04/2019, 3:12 PM

## 2019-05-04 NOTE — Progress Notes (Signed)
Inpatient Rehabilitation-Admissions Coordinator   Consult received. Spoke with pt via phone regarding rehab needs. Pt voiced frustration over being in the hospital and wanting to go home as soon as he can. We discussed concern over safety at [facility] and recommendation for rehab. Pt has concerns over the cost of care and wanted me to discuss it with his significant other [name]. After speaking with [name], she too has concerns regarding potential cost of care at [facility]. I encouraged them to discuss it tonight when she visits him this evening. I will follow up with pt Monday. Will need to assess his cognition and safety awareness as he will not have any help at [facility] as [name] works and cannot take time off to be with him.   If pt demonstrates any cognitive deficits or processing issues, I would recommend SNF as he will need more support over a longer period of time prior to returning home as he will be alone during the day.   Will follow up Monday for further assessment.   Richard Calhoun, OTR/L  Rehab Admissions Coordinator  [phone] 05/04/2019 3:41 PM

## 2019-05-05 LAB — CBC
HCT: 25.5 % — ABNORMAL LOW (ref 39.0–52.0)
Hemoglobin: 8.1 g/dL — ABNORMAL LOW (ref 13.0–17.0)
MCH: 34.2 pg — ABNORMAL HIGH (ref 26.0–34.0)
MCHC: 31.8 g/dL (ref 30.0–36.0)
MCV: 107.6 fL — ABNORMAL HIGH (ref 80.0–100.0)
Platelets: ADEQUATE 10*3/uL (ref 150–400)
RBC: 2.37 MIL/uL — ABNORMAL LOW (ref 4.22–5.81)
RDW: 18.2 % — ABNORMAL HIGH (ref 11.5–15.5)
WBC: 10.3 10*3/uL (ref 4.0–10.5)
nRBC: 0 % (ref 0.0–0.2)

## 2019-05-05 LAB — COMPREHENSIVE METABOLIC PANEL
ALT: 19 U/L (ref 0–44)
AST: 41 U/L (ref 15–41)
Albumin: 1.8 g/dL — ABNORMAL LOW (ref 3.5–5.0)
Alkaline Phosphatase: 215 U/L — ABNORMAL HIGH (ref 38–126)
Anion gap: 10 (ref 5–15)
BUN: 14 mg/dL (ref 6–20)
CO2: 21 mmol/L — ABNORMAL LOW (ref 22–32)
Calcium: 8.3 mg/dL — ABNORMAL LOW (ref 8.9–10.3)
Chloride: 102 mmol/L (ref 98–111)
Creatinine, Ser: 0.65 mg/dL (ref 0.61–1.24)
GFR calc Af Amer: >60 mL/min (ref >60)
GFR calc non Af Amer: >60 mL/min (ref >60)
Glucose, Bld: 97 mg/dL (ref 70–99)
Potassium: 3.9 mmol/L (ref 3.5–5.1)
Sodium: 133 mmol/L — ABNORMAL LOW (ref 135–145)
Total Bilirubin: 2.1 mg/dL — ABNORMAL HIGH (ref 0.3–1.2)
Total Protein: 6.1 g/dL — ABNORMAL LOW (ref 6.5–8.1)

## 2019-05-05 LAB — BPAM RBC
Blood Product Expiration Date: 202011112359
ISSUE DATE / TIME: 202010162116
Unit Type and Rh: 6200

## 2019-05-05 LAB — TYPE AND SCREEN
ABO/RH(D): A POS
Antibody Screen: NEGATIVE
Unit division: 0

## 2019-05-05 NOTE — Progress Notes (Signed)
[facility]  PROGRESS NOTE  Richard Calhoun [id number] DOB: Mar 16, 1961 DOA: 03/30/2019 PCP: Charlott Rakes, MD  Brief History    Richard Calhoun is a 58 y.o. year old male with medical history significant for h/o chronic alcohol abuse, chronic tobacco abuse, CAD status post angioplasty, CHF, dyslipidemia, PVD and paroxysmal tachycardia  who presented on 03/30/2019 with c/ohematemesis and abdominal pain.He had poor oral intake for last 3 days prior to hospitalization. He had worsening left flank pain, and unwitnessed fall, nausea and vomiting for several days.  Patient was admitted with diagnosis of hematemesis due to upper GI bleed medically treated with IV octreotide/IV pantoprazole.  Patient was ultimately intubated due to worsening mentation on 9/13.  Underwent EGD (9/16) found to have esophageal varices with portal hypertensive gastropathy.  Patient was extubated on 9/18.  Patient continued to have encephalopathic symptoms presumed initially to be withdrawal symptoms however this is outside of the time range for alcohol withdrawal presumed to be related to ICU acquired delirium.  Course also complicated by new onset atrial fibrillation with RVR.  He required enteral nutrition with tube feeds starting on 9/21 was able to transition to dysphagia 3 diet after speech therapy evaluation.  Patient currently medically stable but significantly weak and debilitated CSW is assisting with SNF placement  A & P     Acute hypoxic respiratory failure likely secondary left lower lobe aspiration pneumonitis, resolved.  On room air, normal respiratory effort.  Continue dysphagia 3 diet per speech therapy recommendations.   Alcohol hepatitis, cirrhosis.  Supportive care for hepatitis during hospital stay.  Cirrhosis seems compensated now does have 2+ edema lower extremity likely related to position/hypoalbuminemia.  Continue spironolactone, Lasix   Hepatic encephalopathy, resolved.  Alert and oriented  x3 today, continue lactulose therapy, closely monitor BM output and mental status.  No asterixis present.   Atrial fibrillation, normal sinus rhythm, rate controlled.  Continue Lopressor.  Not a candidate for anticoagulation due to falls related to ongoing alcohol use especially in setting of known GI bleeding risks with esophageal varices   Thrombocytopenia, related to alcohol abuse/hepatic steatosis/alcoholic hepatitis, worsening.  Platelets 18, no active bleeding, previously in the 80s 3 to 4 days ago, closely monitor, transfuse if less than 10 or begins bleeding.   Acute on chronic anemia, improved.  Worse in the setting of upper GI bleed related to esophageal varices/portal hypertensive gastropathy on EGD on admission.  Iron consistent with anemia of chronic disease.  Hemoglobin improved from 7-8 after 1 unit transfusion on 10/16, no active signs of bleeding, continue to closely monitor.   Type 2 diabetes.  Within normal limits.  No insulin   Pancreatic lesion will need outpatient MRI/MRCP for repeat imaging given incidental finding on CT abdomen on admission   Alcohol abuse.  Outside window for withdrawal.  Alcohol cessation emphasized.   Hypokalemia/hypomagnesemia, repleted.   Severe debility/deconditioningRelated to ICU stay, prolonged hospitalization.  Acute illness.  CSW working on SNF placement as recommended by PT/OT.  Patient is unsafe for discharge at this time due to he will be alone for multiple hours a day and severe deconditioning.     DVT prophylaxis: SCDs Code Status: Full code Family Communication: [name], significant other updated at bedside Disposition Plan: Continue to closely monitor, CSW working on SNF placement medically stable, currently undergoing inpatient rehab evaluation     [facility] Direct contact: see [email or web address] (further directions at bottom of note if needed) 7PM-7AM contact night coverage as at bottom of note  05/05/2019, 3:55 PM   LOS: 36 days   Consultants   GI, PCCM  Procedures     Antibiotics     Interval History/Subjective  Ate breakfast well No abdominal pain No bleeding  Objective   Vitals:  Vitals:   05/05/19 0429 05/05/19 1303  BP: 111/65 108/60  Pulse: 86 85  Resp: 20 16  Temp: 97.9 F (36.6 C) 98 F (36.7 C)  SpO2: 97% 100%    Exam:  Awake Alert, sitting at bedside chair, oriented X 4, No new F.N deficits, Normal affect Regular rate and rhythm, no murmurs, no rubs, no gallops, 1+ pitting edema bilateral lower extremities (improved from prior) Normal respiratory effort on on room air +ve B.Sounds, Abd Soft, No tenderness, , No rebound - guarding or rigidity.      I have personally reviewed the following:   Data Reviewed: Basic Metabolic Panel: Recent Labs  Lab 04/29/19 0137 05/01/19 0641 05/02/19 0333 05/03/19 0557 05/04/19 0343 05/05/19 0354  NA 131* 129* 133* 131* 132* 133*  K 3.6 3.8 3.9 4.0 3.8 3.9  CL 104 100 101 99 102 102  CO2 20* 20* 23 21* 23 21*  GLUCOSE 124* 99 97 96 105* 97  BUN 15 13 13 12 11 14   CREATININE 0.64 0.72 0.64 0.69 0.92 0.65  CALCIUM 7.8* 8.1* 8.3* 8.5* 8.2* 8.3*  MG 2.2 1.8  --   --   --   --    Liver Function Tests: Recent Labs  Lab 05/05/19 0354  AST 41  ALT 19  ALKPHOS 215*  BILITOT 2.1*  PROT 6.1*  ALBUMIN 1.8*   No results for input(s): LIPASE, AMYLASE in the last 168 hours. Recent Labs  Lab 05/02/19 0430  AMMONIA 56*   CBC: Recent Labs  Lab 05/01/19 0641 05/02/19 0333 05/03/19 0557 05/04/19 0343 05/05/19 0354  WBC 10.8* 11.4* 11.3* 10.4 10.3  HGB 7.3* 7.1* 7.2* 7.1* 8.1*  HCT 23.5* 22.6* 23.2* 22.8* 25.5*  MCV 113.0* 111.9* 110.0* 111.8* 107.6*  PLT 93* PLATELET CLUMPS NOTED ON SMEAR, COUNT APPEARS DECREASED PLATELET CLUMPS NOTED ON SMEAR, COUNT APPEARS DECREASED PLATELET CLUMPS NOTED ON SMEAR, UNABLE TO ESTIMATE PLATELET CLUMPS NOTED ON SMEAR, COUNT APPEARS ADEQUATE   Cardiac Enzymes: No results for  input(s): CKTOTAL, CKMB, CKMBINDEX, TROPONINI in the last 168 hours. BNP (last 3 results) No results for input(s): BNP in the last 8760 hours.  ProBNP (last 3 results) No results for input(s): PROBNP in the last 8760 hours.  CBG: No results for input(s): GLUCAP in the last 168 hours.  No results found for this or any previous visit (from the past 240 hour(s)).   Studies: No results found.  Scheduled Meds:  sodium chloride   Intravenous Once   sodium chloride   Intravenous Once   Chlorhexidine Gluconate Cloth  6 each Topical Daily   docusate sodium  100 mg Oral BID   furosemide  20 mg Oral Daily   gabapentin  200 mg Oral BID   Gerhardt's butt cream   Topical TID   hydrocortisone  25 mg Rectal QHS   lactulose  10 g Oral Daily   mouth rinse  15 mL Mouth Rinse BID   metoprolol tartrate  12.5 mg Oral BID   miconazole nitrate   Topical BID   multivitamin with minerals  1 tablet Oral Daily   pantoprazole  40 mg Oral BID   sodium chloride flush  10-40 mL Intracatheter Q12H   spironolactone  25 mg Oral  Daily   Continuous Infusions:  sodium chloride 250 mL (04/19/19 1224)    Principal Problem:   Acute hyperactive alcohol withdrawal delirium (HCC) Active Problems:   Anxiety state   EtOH dependence (HCC)   Hypertensive heart disease without heart failure   S/P drug eluting coronary stent placement   Upper GI bleed   Acute renal failure (HCC)   Alcoholic hepatitis without ascites   Acute hepatic encephalopathy   Encounter for central line placement   Acute hypernatremia   Metabolic acidosis   Thrombocytopenia (HCC)   Left arm swelling   Hypomagnesemia   Chronic diastolic CHF (congestive heart failure) (HCC)   Macrocytic anemia   Unspecified atrial fibrillation (HCC)   Iron deficiency anemia due to chronic blood loss   Pancreatic lesion      Desiree Hane  [facility]

## 2019-05-05 NOTE — Progress Notes (Signed)
Physical Therapy Treatment Patient Details Name: Richard Calhoun MRN: [id number] DOB: 1960-12-02 Today's Date: 05/05/2019    History of Present Illness 58 yo male admitted to ED on 9/10 for R flank pain, ETOH, N/V. Pt requiring intubation 9/13-9/18. Endoscopy reveals nonbleeding erosive gastropathy, duodenal erosions; placed feeding tube. Pt with new diagnosis LLL aspiration pneumonitis, hypoactive delirium. PMH includes anxiety, alcohol abuse, depression, GERD, HTN, HLD, tubular adenoma of colon, CAD with stent placement, PVD.    PT Comments    Pt bathed just before session and fatigued from bath.  Pt felt unable to tolerate RW due to fatigue so ambulated with EVA (bilateral forearm UE support) walker and tolerated improved distance.  Pt with uncoordinated LEs and reports pain in his feet.  Feet appear edematous compared to lower legs.  Pt educated to elevate LEs and perform ankle pumps throughout the day.    Follow Up Recommendations  SNF;CIR     Equipment Recommendations  None recommended by PT    Recommendations for Other Services       Precautions / Restrictions Precautions Precautions: Fall Precaution Comments: s/p VDRF + extended LOS in ICU    Mobility  Bed Mobility               General bed mobility comments: pt sitting EOB with nurse tech, just finished bathing  Transfers Overall transfer level: Needs assistance Equipment used: Bilateral platform walker Transfers: Sit to/from Stand Sit to Stand: Min assist;Mod assist;+2 safety/equipment         General transfer comment: assist to rise and steady, pt sat down again on bed upon return from ambulating and encouraged to remain up in recliner for lunch, pt required mod assist likely due to fatigue with rise from bed  Ambulation/Gait Ambulation/Gait assistance: Min assist;+2 safety/equipment Gait Distance (Feet): 400 Feet Assistive device: Bilateral platform walker Gait Pattern/deviations: Step-through  pattern;Shuffle;Drifts right/left     General Gait Details: slight drift to right and provided cues for straight path; recliner following for safety, pt required 4 standing rest breaks however declined sitting; pt felt too fatigued from bathing to try RW today however performed good distance with EVA walker   Stairs             Wheelchair Mobility    Modified Rankin (Stroke Patients Only)       Balance                                            Cognition Arousal/Alertness: Awake/alert Behavior During Therapy: WFL for tasks assessed/performed Overall Cognitive Status: Within Functional Limits for tasks assessed                                        Exercises      General Comments        Pertinent Vitals/Pain Pain Assessment: Faces Faces Pain Scale: Hurts little more Pain Location: B feet (swelling) and chronic back pain Pain Descriptors / Indicators: Sore Pain Intervention(s): Repositioned;Monitored during session    Home Living                      Prior Function            PT Goals (current goals can now be found in the care plan  section) Acute Rehab PT Goals PT Goal Formulation: With patient Time For Goal Achievement: 05/19/19 Potential to Achieve Goals: Fair Progress towards PT goals: Progressing toward goals    Frequency    Min 2X/week      PT Plan Current plan remains appropriate    Co-evaluation              AM-PAC PT "6 Clicks" Mobility   Outcome Measure  Help needed turning from your back to your side while in a flat bed without using bedrails?: A Little Help needed moving from lying on your back to sitting on the side of a flat bed without using bedrails?: A Little Help needed moving to and from a bed to a chair (including a wheelchair)?: A Little Help needed standing up from a chair using your arms (e.g., wheelchair or bedside chair)?: A Little Help needed to walk in hospital  room?: A Lot Help needed climbing 3-5 steps with a railing? : Total 6 Click Score: 15    End of Session Equipment Utilized During Treatment: Gait belt Activity Tolerance: Patient tolerated treatment well Patient left: in chair;with call bell/phone within reach;with chair alarm set   PT Visit Diagnosis: Muscle weakness (generalized) (M62.81);Other abnormalities of gait and mobility (R26.89)     Time: [id number] PT Time Calculation (min) (ACUTE ONLY): 26 min  Charges:  $Gait Training: 8-22 mins                    Carmelia Bake, PT, DPT Acute Rehabilitation Services Office: [phone] Pager: [phone]  Trena Platt 05/05/2019, 1:29 PM

## 2019-05-05 NOTE — Plan of Care (Signed)
Patient lying in bed this morning; no complaints of pain at this time. No needs expressed. Will continue to monitor.

## 2019-05-06 LAB — CBC
HCT: 26 % — ABNORMAL LOW (ref 39.0–52.0)
Hemoglobin: 8.2 g/dL — ABNORMAL LOW (ref 13.0–17.0)
MCH: 34.2 pg — ABNORMAL HIGH (ref 26.0–34.0)
MCHC: 31.5 g/dL (ref 30.0–36.0)
MCV: 108.3 fL — ABNORMAL HIGH (ref 80.0–100.0)
RBC: 2.4 MIL/uL — ABNORMAL LOW (ref 4.22–5.81)
RDW: 17.8 % — ABNORMAL HIGH (ref 11.5–15.5)
WBC: 10.3 10*3/uL (ref 4.0–10.5)
nRBC: 0 % (ref 0.0–0.2)

## 2019-05-06 LAB — IMMATURE PLATELET FRACTION: Immature Platelet Fraction: 93 % — ABNORMAL HIGH (ref 1.2–8.6)

## 2019-05-06 MED ORDER — BACITRACIN-NEOMYCIN-POLYMYXIN 400-5-5000 EX OINT
TOPICAL_OINTMENT | Freq: Every day | CUTANEOUS | Status: DC
  Administered 2019-05-06 – 2019-05-25 (×18): via TOPICAL

## 2019-05-06 MED ORDER — BACITRACIN-NEOMYCIN-POLYMYXIN OINTMENT TUBE: TOPICAL_OINTMENT | Freq: Every day | CUTANEOUS | Status: DC

## 2019-05-06 NOTE — Progress Notes (Signed)
[facility]  PROGRESS NOTE  Richard Calhoun [id number] DOB: 09-13-1960 DOA: 03/30/2019 PCP: Charlott Rakes, MD  Brief History    Richard Calhoun is a 58 y.o. year old male with medical history significant for h/o chronic alcohol abuse, chronic tobacco abuse, CAD status post angioplasty, CHF, dyslipidemia, PVD and paroxysmal tachycardia  who presented on 03/30/2019 with c/ohematemesis and abdominal pain.He had poor oral intake for last 3 days prior to hospitalization. He had worsening left flank pain, and unwitnessed fall, nausea and vomiting for several days.  Patient was admitted with diagnosis of hematemesis due to upper GI bleed medically treated with IV octreotide/IV pantoprazole.  Patient was ultimately intubated due to worsening mentation on 9/13.  Underwent EGD (9/16) found to have esophageal varices with portal hypertensive gastropathy.  Patient was extubated on 9/18.  Patient continued to have encephalopathic symptoms presumed initially to be withdrawal symptoms however this is outside of the time range for alcohol withdrawal presumed to be related to ICU acquired delirium.  Course also complicated by new onset atrial fibrillation with RVR.  He required enteral nutrition with tube feeds starting on 9/21 was able to transition to dysphagia 3 diet after speech therapy evaluation.  Patient currently medically stable but significantly weak and debilitated CSW is assisting with SNF placement  A & P     Acute hypoxic respiratory failure likely secondary left lower lobe aspiration pneumonitis, resolved.  On room air, normal respiratory effort.  Continue dysphagia 3 diet per speech therapy recommendations.   Alcohol hepatitis, cirrhosis.  Supportive care for hepatitis during hospital stay.  Cirrhosis seems compensated n bilateral lower extremity peripheral edema has improved with compression stocking/position, likely due to dependent edema and hypoalbuminemia.Continue spironolactone,  Lasix   Hepatic encephalopathy, resolved.  Alert and oriented x3 today, continue lactulose therapy, closely monitor BM output and mental status.  No asterixis present.   Atrial fibrillation, normal sinus rhythm, rate controlled.  Continue Lopressor.  Not a candidate for anticoagulation due to falls related to ongoing alcohol use especially in setting of known GI bleeding risks with esophageal varices   Thrombocytopenia, related to alcohol abuse/hepatic steatosis/alcoholic hepatitis, stable.  Platelet clumping on lab, noted as stable counts, no active bleeding   Acute on chronic anemia, improved.  Acutely worsening on admission in the setting of upper GI bleed related to esophageal varices/portal hypertensive gastropathy on EGD on admission.  Iron panel consistent with anemia of chronic disease.  Hemoglobin improved from 7-8 after 1 unit transfusion on 10/16 and remained stable, no active signs of bleeding, continue to closely monitor.   Type 2 diabetes.  A1c within normal limits.  No insulin   Pancreatic lesion will need outpatient MRI/MRCP for repeat imaging given incidental finding on CT abdomen on admission   Alcohol abuse.  Outside window for withdrawal.  Alcohol cessation emphasized.   Hypokalemia/hypomagnesemia, repleted.   Severe debility/deconditioningRelated to ICU stay, prolonged hospitalization.  Acute illness.  CSW working on SNF placement as recommended by PT/OT.  Patient is unsafe for discharge at this time due to he will be alone for multiple hours a day and severe deconditioning.  Inpatient rehab has been consulted     DVT prophylaxis: SCDs Code Status: Full code Family Communication: [name], significant other updated at bedside Disposition Plan: Continue to closely monitor, CSW working on SNF placement medically stable, currently undergoing inpatient rehab evaluation     [facility] Direct contact: see [email or web address] (further directions at bottom of note if  needed) 7PM-7AM contact  night coverage as at bottom of note 05/06/2019, 1:30 PM  LOS: 37 days   Consultants  . GI, PCCM  Procedures  .   Antibiotics  .   Interval History/Subjective  No events overnight Does report some scrotal erythema with lesion from area of previous condom cath, denies any pain Normal BMs Objective   Vitals:  Vitals:   05/05/19 2105 05/06/19 0552  BP: 100/84 114/71  Pulse: 95 85  Resp: 14 16  Temp: 98.2 F (36.8 C) 97.6 F (36.4 C)  SpO2: 98% 99%    Exam:  Awake Alert, in bed, oriented X 4, No new F.N deficits, Normal affect Regular rate and rhythm, no murmurs, no rubs, no gallops, 1+ pitting edema bilateral lower extremities (improved from prior) Normal respiratory effort on on room air +ve B.Sounds, Abd Soft, No tenderness, , No rebound - guarding or rigidity. Small scab surrounding base of penis and scrotum (at site of previous condom cath), no overt signs of infection      I have personally reviewed the following:   Data Reviewed: Basic Metabolic Panel: Recent Labs  Lab 05/01/19 0641 05/02/19 0333 05/03/19 0557 05/04/19 0343 05/05/19 0354  NA 129* 133* 131* 132* 133*  K 3.8 3.9 4.0 3.8 3.9  CL 100 101 99 102 102  CO2 20* 23 21* 23 21*  GLUCOSE 99 97 96 105* 97  BUN 13 13 12 11 14   CREATININE 0.72 0.64 0.69 0.92 0.65  CALCIUM 8.1* 8.3* 8.5* 8.2* 8.3*  MG 1.8  --   --   --   --    Liver Function Tests: Recent Labs  Lab 05/05/19 0354  AST 41  ALT 19  ALKPHOS 215*  BILITOT 2.1*  PROT 6.1*  ALBUMIN 1.8*   No results for input(s): LIPASE, AMYLASE in the last 168 hours. Recent Labs  Lab 05/02/19 0430  AMMONIA 56*   CBC: Recent Labs  Lab 05/02/19 0333 05/03/19 0557 05/04/19 0343 05/05/19 0354 05/06/19 0421  WBC 11.4* 11.3* 10.4 10.3 10.3  HGB 7.1* 7.2* 7.1* 8.1* 8.2*  HCT 22.6* 23.2* 22.8* 25.5* 26.0*  MCV 111.9* 110.0* 111.8* 107.6* 108.3*  PLT PLATELET CLUMPS NOTED ON SMEAR, COUNT APPEARS DECREASED PLATELET  CLUMPS NOTED ON SMEAR, COUNT APPEARS DECREASED PLATELET CLUMPS NOTED ON SMEAR, UNABLE TO ESTIMATE PLATELET CLUMPS NOTED ON SMEAR, COUNT APPEARS ADEQUATE PLATELET CLUMPING, SUGGEST RECOLLECTION OF SAMPLE IN CITRATE TUBE.   Cardiac Enzymes: No results for input(s): CKTOTAL, CKMB, CKMBINDEX, TROPONINI in the last 168 hours. BNP (last 3 results) No results for input(s): BNP in the last 8760 hours.  ProBNP (last 3 results) No results for input(s): PROBNP in the last 8760 hours.  CBG: No results for input(s): GLUCAP in the last 168 hours.  No results found for this or any previous visit (from the past 240 hour(s)).   Studies: No results found.  Scheduled Meds: . sodium chloride   Intravenous Once  . sodium chloride   Intravenous Once  . Chlorhexidine Gluconate Cloth  6 each Topical Daily  . docusate sodium  100 mg Oral BID  . furosemide  20 mg Oral Daily  . gabapentin  200 mg Oral BID  . Gerhardt's butt cream   Topical TID  . hydrocortisone  25 mg Rectal QHS  . lactulose  10 g Oral Daily  . mouth rinse  15 mL Mouth Rinse BID  . metoprolol tartrate  12.5 mg Oral BID  . miconazole nitrate   Topical BID  . multivitamin  with minerals  1 tablet Oral Daily  . pantoprazole  40 mg Oral BID  . sodium chloride flush  10-40 mL Intracatheter Q12H  . spironolactone  25 mg Oral Daily   Continuous Infusions: . sodium chloride 250 mL (04/19/19 1224)    Principal Problem:   Acute hyperactive alcohol withdrawal delirium (HCC) Active Problems:   Anxiety state   EtOH dependence (HCC)   Hypertensive heart disease without heart failure   S/P drug eluting coronary stent placement   Upper GI bleed   Acute renal failure (HCC)   Alcoholic hepatitis without ascites   Acute hepatic encephalopathy   Encounter for central line placement   Acute hypernatremia   Metabolic acidosis   Thrombocytopenia (HCC)   Left arm swelling   Hypomagnesemia   Chronic diastolic CHF (congestive heart failure) (HCC)    Macrocytic anemia   Unspecified atrial fibrillation (HCC)   Iron deficiency anemia due to chronic blood loss   Pancreatic lesion      Richard Calhoun  [facility]

## 2019-05-07 LAB — CBC
HCT: 25.6 % — ABNORMAL LOW (ref 39.0–52.0)
Hemoglobin: 8.2 g/dL — ABNORMAL LOW (ref 13.0–17.0)
MCH: 34.6 pg — ABNORMAL HIGH (ref 26.0–34.0)
MCHC: 32 g/dL (ref 30.0–36.0)
MCV: 108 fL — ABNORMAL HIGH (ref 80.0–100.0)
Platelets: DECREASED 10*3/uL (ref 150–400)
RBC: 2.37 MIL/uL — ABNORMAL LOW (ref 4.22–5.81)
RDW: 17.2 % — ABNORMAL HIGH (ref 11.5–15.5)
WBC: 10.3 10*3/uL (ref 4.0–10.5)
nRBC: 0 % (ref 0.0–0.2)

## 2019-05-07 MED ORDER — SPIRONOLACTONE 12.5 MG HALF TABLET
12.5000 mg | ORAL_TABLET | Freq: Every day | ORAL | Status: DC
  Administered 2019-05-08 – 2019-05-25 (×18): 12.5 mg via ORAL
  Filled 2019-05-07 (×19): qty 1

## 2019-05-07 NOTE — Progress Notes (Signed)
Inpatient Rehabilitation-Admissions Coordinator   Met with pt and his significant other [name] at the bedside this evening. Reviewed program details and potential cost of care. Both are willing to pursue CIR despite cost. Discussed need for supervision at [place] and [name] states she will ask her boss for time off and will know by tomorrow. If pt can have supervision and a safe discharge environment, we can proceed with CIR in the next few days.  Will follow up tomorrow once I hear back from his caregiver support.   Jhonnie Garner, OTR/L  Rehab Admissions Coordinator  [phone] 05/07/2019 6:19 PM

## 2019-05-07 NOTE — Progress Notes (Signed)
[facility]  PROGRESS NOTE  Richard Calhoun [id number] DOB: [birth year] DOA: 03/30/2019 PCP: Charlott Rakes, MD  Brief History    Richard Calhoun is a 58 y.o. year old male with medical history significant for h/o chronic alcohol abuse, chronic tobacco abuse, CAD status post angioplasty, CHF, dyslipidemia, PVD and paroxysmal tachycardia  who presented on 03/30/2019 with c/ohematemesis and abdominal pain.He had poor oral intake for last 3 days prior to hospitalization. He had worsening left flank pain, and unwitnessed fall, nausea and vomiting for several days.  Patient was admitted with diagnosis of hematemesis due to upper GI bleed medically treated with IV octreotide/IV pantoprazole.  Patient was ultimately intubated due to worsening mentation on 9/13.  Underwent EGD (9/16) found to have esophageal varices with portal hypertensive gastropathy.  Patient was extubated on 9/18.  Patient continued to have encephalopathic symptoms presumed initially to be withdrawal symptoms however this is outside of the time range for alcohol withdrawal presumed to be related to ICU acquired delirium.  Course also complicated by new onset atrial fibrillation with RVR.  He required enteral nutrition with tube feeds starting on 9/21 was able to transition to dysphagia 3 diet after speech therapy evaluation.  Patient currently medically stable but significantly weak and debilitated CSW is assisting with SNF placement  A & P     Acute hypoxic respiratory failure likely secondary left lower lobe aspiration pneumonitis, resolved.  On room air, normal respiratory effort.  Continue dysphagia 3 diet per speech therapy recommendations.   Alcohol hepatitis, cirrhosis.  Supportive care for hepatitis during hospital stay.  Cirrhosis seems compensated n bilateral lower extremity peripheral edema has improved with compression stocking/position, likely due to dependent edema and hypoalbuminemia.Continue spironolactone,  Lasix   Hepatic encephalopathy, resolved.  Alert and oriented x3 today, continue lactulose therapy, closely monitor BM output and mental status.  No asterixis present.   Atrial fibrillation, normal sinus rhythm, rate controlled.  Continue Lopressor.  Not a candidate for anticoagulation due to falls related to ongoing alcohol use especially in setting of known GI bleeding risks with esophageal varices   Thrombocytopenia, related to alcohol abuse/hepatic steatosis/alcoholic hepatitis, stable.  Platelet clumping on lab, noted as stable counts, no active bleeding   Acute on chronic anemia, improved.  Acutely worsening on admission in the setting of upper GI bleed related to esophageal varices/portal hypertensive gastropathy on EGD on admission.  Iron panel consistent with anemia of chronic disease.  Hemoglobin improved from 7-8 after 1 unit transfusion on 10/16 and remained stable, no active signs of bleeding, continue to closely monitor.   Type 2 diabetes.  A1c within normal limits.  No insulin   Pancreatic lesion will need outpatient MRI/MRCP for repeat imaging given incidental finding on CT abdomen on admission   Alcohol abuse.  Outside window for withdrawal.  Alcohol cessation emphasized.   Hypokalemia/hypomagnesemia, repleted.   Severe debility/deconditioningRelated to ICU stay, prolonged hospitalization.  Acute illness.  CSW working on SNF placement as recommended by PT/OT.  Patient is unsafe for discharge at this time due to he will be alone for multiple hours a day and severe deconditioning.  Inpatient rehab has been consulted     DVT prophylaxis: SCDs Code Status: Full code Family Communication: [name], significant other updated at bedside Disposition Plan: medically stable for discharge, CSW working on SNF placement medically stable, currently undergoing inpatient rehab evaluation     [facility] Direct contact: see [email or web address] (further directions at bottom of note  if needed) 7PM-7AM contact  night coverage as at bottom of note 05/07/2019, 5:35 PM  LOS: 38 days   Consultants  . GI, PCCM  Procedures  .   Antibiotics  .   Interval History/Subjective  No events overnight Still has some pain in scrotal area where previous condom cath was Objective   Vitals:  Vitals:   05/06/19 2102 05/07/19 1351  BP: 117/64 (!) 106/59  Pulse: 95 82  Resp: 16 17  Temp: 98.6 F (37 C) 98.3 F (36.8 C)  SpO2: 98% 97%    Exam:  Awake Alert, in bed, oriented X 4, No new F.N deficits, Normal affect Regular rate and rhythm, no murmurs, no rubs, no gallops, 1+ pitting edema bilateral lower extremities (improved from prior) Normal respiratory effort on on room air +ve B.Sounds, Abd Soft, No tenderness, , No rebound - guarding or rigidity. Small scab surrounding base of penis and scrotum (at site of previous condom cath), no overt signs of infection      I have personally reviewed the following:   Data Reviewed: Basic Metabolic Panel: Recent Labs  Lab 05/01/19 0641 05/02/19 0333 05/03/19 0557 05/04/19 0343 05/05/19 0354  NA 129* 133* 131* 132* 133*  K 3.8 3.9 4.0 3.8 3.9  CL 100 101 99 102 102  CO2 20* 23 21* 23 21*  GLUCOSE 99 97 96 105* 97  BUN 13 13 12 11 14   CREATININE 0.72 0.64 0.69 0.92 0.65  CALCIUM 8.1* 8.3* 8.5* 8.2* 8.3*  MG 1.8  --   --   --   --    Liver Function Tests: Recent Labs  Lab 05/05/19 0354  AST 41  ALT 19  ALKPHOS 215*  BILITOT 2.1*  PROT 6.1*  ALBUMIN 1.8*   No results for input(s): LIPASE, AMYLASE in the last 168 hours. Recent Labs  Lab 05/02/19 0430  AMMONIA 56*   CBC: Recent Labs  Lab 05/03/19 0557 05/04/19 0343 05/05/19 0354 05/06/19 0421 05/07/19 0626  WBC 11.3* 10.4 10.3 10.3 10.3  HGB 7.2* 7.1* 8.1* 8.2* 8.2*  HCT 23.2* 22.8* 25.5* 26.0* 25.6*  MCV 110.0* 111.8* 107.6* 108.3* 108.0*  PLT PLATELET CLUMPS NOTED ON SMEAR, COUNT APPEARS DECREASED PLATELET CLUMPS NOTED ON SMEAR, UNABLE TO  ESTIMATE PLATELET CLUMPS NOTED ON SMEAR, COUNT APPEARS ADEQUATE PLATELET CLUMPING, SUGGEST RECOLLECTION OF SAMPLE IN CITRATE TUBE. PLATELET CLUMPS NOTED ON SMEAR, COUNT APPEARS DECREASED   Cardiac Enzymes: No results for input(s): CKTOTAL, CKMB, CKMBINDEX, TROPONINI in the last 168 hours. BNP (last 3 results) No results for input(s): BNP in the last 8760 hours.  ProBNP (last 3 results) No results for input(s): PROBNP in the last 8760 hours.  CBG: No results for input(s): GLUCAP in the last 168 hours.  No results found for this or any previous visit (from the past 240 hour(s)).   Studies: No results found.  Scheduled Meds: . sodium chloride   Intravenous Once  . sodium chloride   Intravenous Once  . Chlorhexidine Gluconate Cloth  6 each Topical Daily  . docusate sodium  100 mg Oral BID  . furosemide  20 mg Oral Daily  . gabapentin  200 mg Oral BID  . Gerhardt's butt cream   Topical TID  . hydrocortisone  25 mg Rectal QHS  . lactulose  10 g Oral Daily  . mouth rinse  15 mL Mouth Rinse BID  . metoprolol tartrate  12.5 mg Oral BID  . miconazole nitrate   Topical BID  . multivitamin with minerals  1 tablet  Oral Daily  . neomycin-bacitracin-polymyxin   Topical Daily  . pantoprazole  40 mg Oral BID  . sodium chloride flush  10-40 mL Intracatheter Q12H  . spironolactone  25 mg Oral Daily   Continuous Infusions: . sodium chloride 250 mL (04/19/19 1224)    Principal Problem:   Acute hyperactive alcohol withdrawal delirium (HCC) Active Problems:   Anxiety state   EtOH dependence (HCC)   Hypertensive heart disease without heart failure   S/P drug eluting coronary stent placement   Upper GI bleed   Acute renal failure (HCC)   Alcoholic hepatitis without ascites   Acute hepatic encephalopathy   Encounter for central line placement   Acute hypernatremia   Metabolic acidosis   Thrombocytopenia (HCC)   Left arm swelling   Hypomagnesemia   Chronic diastolic CHF (congestive  heart failure) (HCC)   Macrocytic anemia   Unspecified atrial fibrillation (HCC)   Iron deficiency anemia due to chronic blood loss   Pancreatic lesion      Desiree Hane  [facility]

## 2019-05-07 NOTE — Progress Notes (Signed)
Physical Therapy Treatment Patient Details Name: Richard Calhoun MRN: [id number] DOB: 1960/09/12 Today's Date: 05/07/2019    History of Present Illness 58 yo male admitted to ED on 9/10 for R flank pain, ETOH, N/V. Pt requiring intubation 9/13-9/18. Endoscopy reveals nonbleeding erosive gastropathy, duodenal erosions; placed feeding tube. Pt with new diagnosis LLL aspiration pneumonitis, hypoactive delirium. PMH includes anxiety, alcohol abuse, depression, GERD, HTN, HLD, tubular adenoma of colon, CAD with stent placement, PVD.    PT Comments    Pt progressing well with his mobility.  General bed mobility comments: HOB elevated and use o77f rail, improving each session to self perform.  General transfer comment: increased ability to self rise and control lower use B UE's.  General Gait Details: First started out with using standard RW pt was able to amb 45 feet + 2 assist for safety due to B LE weakness and c/o B feet "numb" and "edema".  Recliner following for safety if knees buckle.  After 45 feet, switch to B platform [name] to cont gait and increase upright stance time as well as gait distance.  Pt progressing well each session.   Follow Up Recommendations  1.  CIR 2. SNF if CIR is unachievable     Equipment Recommendations  None recommended by PT    Recommendations for Other Services       Precautions / Restrictions Precautions Precautions: Fall Precaution Comments: s/p VDRF + extended LOS in ICU Restrictions Weight Bearing Restrictions: No    Mobility  Bed Mobility Overal bed mobility: Needs Assistance Bed Mobility: Supine to Sit     Supine to sit: Min guard;Min assist     General bed mobility comments: HOB elevated and use o41f rail, improving each session to self perform  Transfers Overall transfer level: Needs assistance Equipment used: Bilateral platform walker;Rolling walker (2 wheeled) Transfers: Sit to/from Stand Sit to Stand: +2 safety/equipment;Min  guard Stand pivot transfers: Min guard;Min assist;+2 safety/equipment       General transfer comment: increased ability to self rise and control lower use B UE's  Ambulation/Gait Ambulation/Gait assistance: Min assist;+2 safety/equipment Gait Distance (Feet): 385 Feet Assistive device: Bilateral platform walker;Rolling walker (2 wheeled) Gait Pattern/deviations: Step-through pattern;Shuffle Gait velocity: decreased   General Gait Details: First started out with using standard RW pt was able to amb 45 feet + 2 assist for safety due to B LE weakness and c/o B feet "numb" and "edema".  Recliner following for safety if knees buckle.  After 45 feet, switch to B platform [name] to cont gait and increase upright stance time as well as gait distance.  Pt progressing well each session.   Stairs             Wheelchair Mobility    Modified Rankin (Stroke Patients Only)       Balance                                            Cognition Arousal/Alertness: Awake/alert Behavior During Therapy: WFL for tasks assessed/performed Overall Cognitive Status: Within Functional Limits for tasks assessed                                 General Comments: AxO x 4  pleasant  motivated      Exercises  General Comments        Pertinent Vitals/Pain Pain Assessment: 0-10 Pain Score: 2  Faces Pain Scale: Hurts a little bit Pain Location: B feet (improving decreased edema) Pain Descriptors / Indicators: Sore;Aching Pain Intervention(s): Monitored during session;Repositioned    Home Living                      Prior Function            PT Goals (current goals can now be found in the care plan section) Progress towards PT goals: Progressing toward goals    Frequency    Min 2X/week      PT Plan Current plan remains appropriate    Co-evaluation              AM-PAC PT "6 Clicks" Mobility   Outcome Measure  Help needed  turning from your back to your side while in a flat bed without using bedrails?: A Little Help needed moving from lying on your back to sitting on the side of a flat bed without using bedrails?: A Little Help needed moving to and from a bed to a chair (including a wheelchair)?: A Little Help needed standing up from a chair using your arms (e.g., wheelchair or bedside chair)?: A Little Help needed to walk in hospital room?: A Lot Help needed climbing 3-5 steps with a railing? : Total 6 Click Score: 15    End of Session Equipment Utilized During Treatment: Gait belt Activity Tolerance: Patient tolerated treatment well Patient left: in chair;with call bell/phone within reach;with chair alarm set   PT Visit Diagnosis: Muscle weakness (generalized) (M62.81);Other abnormalities of gait and mobility (R26.89)     Time: [id number] PT Time Calculation (min) (ACUTE ONLY): 27 min  Charges:  $Gait Training: 8-22 mins $Therapeutic Activity: 8-22 mins                     Rica Koyanagi  PTA Acute  Rehabilitation Services Pager      [phone] Office      [phone]

## 2019-05-08 NOTE — Progress Notes (Signed)
Nutrition Follow-up  INTERVENTION:   -Multivitamin with minerals daily -Magic cupBID with meals, each supplement provides 290 kcal and 9 grams of protein  NUTRITION DIAGNOSIS:   Increased nutrient needs related to acute illness as evidenced by estimated needs.  Ongoing.  GOAL:   Patient will meet greater than or equal to 90% of their needs  Progressing.  MONITOR:   PO intake, Supplement acceptance, Labs, Weight trends  ASSESSMENT:   58 year-old male with medical history of chronic alcohol abuse (half gallon of rum/day), CAD and PCI, unstable angina with stent placement in 2018, CHF, hyperlipidemia, mild peripheral vascular disease, and tachycardia. Patient presented to the ED on 9/11 d/t increasing malaise and increased sleeping duration x5 days, vomiting and poor PO intakes x3 days.   Significant Events: 9/11- admission 9/13- intubation; OGT placement 9/14- initial RD assessment 9/16- OGT removed and replaced 9/18- extubation and OGT removed 9/19- diet advanced from NPO to [place] 9/20- returned to NPO 9/22- NGT placement; TF initiation 9/26- TF stopped; NGT removed 9/28- diet advanced to Dysphagia 3, thin liquids  **RD working remotely**  Patient currently consuming 25-50% of meals.  Per MD note, pt stable for discharge, awaiting SNF placement.  No new weight since 9/28. I/Os: -19L since 10/6 UOP 10/19: 2950 ml  Medications: Lasix tablet daily, Multivitamin with minerals daily, Calcium carbonate PRN,  Labs reviewed: Low Na  Diet Order:   Diet Order            DIET DYS 3 Room service appropriate? No; Fluid consistency: Thin  Diet effective now              EDUCATION NEEDS:   No education needs have been identified at this time  Skin:  Skin Assessment: Reviewed RN Assessment  Last BM:  10/17  Height:   Ht Readings from Last 1 Encounters:  04/14/19 5\' 10"  (1.778 m)    Weight:   Wt Readings from Last 1 Encounters:  04/16/19 85.6 kg     Ideal Body Weight:  75.4 kg  BMI:  Body mass index is 27.08 kg/m.  Estimated Nutritional Needs:   Kcal:  1900-2100 kcal  Protein:  95-105 grams  Fluid:  >/= 2 L/day  Clayton Bibles, MS, RD, LDN Inpatient Clinical Dietitian Pager: [phone] After Hours Pager: [phone]

## 2019-05-08 NOTE — TOC Progression Note (Signed)
Transition of Care [facility]) - Progression Note    Patient Details  Name: MYZEL TILLIS MRN: [id number] Date of Birth: 10-Mar-1961  Transition of [facility]) CM/SW Contact  Leeroy Cha, RN Phone Number: 05/08/2019, 10:39 AM  Clinical Narrative:    Nevin Bloodgood novice contacted to see what transition has taken place with application for medicaid will get back to me.   Expected Discharge Plan: Skilled Nursing Facility Barriers to Discharge: Continued Medical Work up, Inadequate or no insurance  Expected Discharge Plan and Services Expected Discharge Plan: [place] In-house Referral: Clinical Social Work Discharge Planning Services: CM Consult Post Acute Care Choice: [place] Living arrangements for the past 2 months: Single Family Home Expected Discharge Date: (unknown)                                     Social Determinants of Health (SDOH) Interventions    Readmission Risk Interventions Readmission Risk Prevention Plan 04/04/2019  Transportation Screening Complete  PCP or Specialist Appt within 3-5 Days Not Complete  Not Complete comments Not ready for dc  HRI or [place] Not Complete  HRI or Home Care Consult comments NA  Social Work Consult for [place] Planning/Counseling Not Complete  SW consult not completed comments Pt confused  Palliative Care Screening Not Applicable  Medication Review Press photographer) Complete  Some recent data might be hidden

## 2019-05-08 NOTE — Progress Notes (Signed)
[facility]  PROGRESS NOTE  Richard Calhoun [id number] DOB: 10/14/1960 DOA: 03/30/2019 PCP: Charlott Rakes, MD  Brief History    FLINT ETTER is a 58 y.o. year old male with medical history significant for h/o chronic alcohol abuse, chronic tobacco abuse, CAD status post angioplasty, CHF, dyslipidemia, PVD and paroxysmal tachycardia  who presented on 03/30/2019 with c/ohematemesis and abdominal pain.He had poor oral intake for last 3 days prior to hospitalization. He had worsening left flank pain, and unwitnessed fall, nausea and vomiting for several days.  Patient was admitted with diagnosis of hematemesis due to upper GI bleed medically treated with IV octreotide/IV pantoprazole/Rocephin.  Patient was ultimately intubated due to worsening mentation on 9/13.  Underwent EGD (9/16) found to have esophageal varices with portal hypertensive gastropathy.  Patient was extubated on 9/18.  Patient continued to have encephalopathic symptoms presumed initially to be withdrawal symptoms however this is outside of the time range for alcohol withdrawal presumed to be related to ICU acquired delirium.  Course also complicated by new onset atrial fibrillation with RVR.  He required enteral nutrition with tube feeds starting on 9/21 was able to transition to dysphagia 3 diet after speech therapy evaluation.  Patient currently medically stable but significantly weak and debilitated CSW is assisting with SNF placement  A & P     Acute hypoxic respiratory failure likely secondary left lower lobe aspiration pneumonitis, resolved.  On room air, normal respiratory effort.  Continue dysphagia 3 diet per speech therapy recommendations.   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  Supportive care for hepatitis during hospital stay.  Has grade 1 esophageal varices. Compensated on exam.    Hematemesis, resolved. Admitting symptoms. S/p EGD on 9/15 with grade 1  esophageal varices, portal gastropathy, H. pyloryi negative. Completed rocephin course and 72 hours of octreotide on admission   Hepatic encephalopathy, resolved.  Alert and oriented x 3, no asterixis. Has had decrease in sensorium when lactulose discontinued previously, continue daily lactulose, closely monitor stool output.    Atrial fibrillation, normal sinus rhythm, rate controlled.  Continue Lopressor.  Not a candidate for anticoagulation due to falls related to ongoing alcohol use especially in setting of known GI bleeding risks with esophageal varices   Thrombocytopenia, related to alcohol abuse/hepatic steatosis/alcoholic hepatitis/early cirrhosis, stable.  Platelet clumping on lab, noted as stable counts, no active bleeding   Acute on chronic anemia, improved.  Acutely worsening on admission in the setting of upper GI bleed related to esophageal varices/portal hypertensive gastropathy on EGD on admission.  Iron panel consistent with anemia of chronic disease.  Last blood transfusionon 10/16, hgb stable, no active signs of bleeding, continue to closely monitor.   Type 2 diabetes.  A1c within normal limits.  No insulin needed   Pancreatic cystic lesion will need outpatient MRI/MRCP for repeat imaging given incidental finding on CT abdomen on admission and poor imaging on MRCP here as patient terminated exam prematurely   Alcohol abuse.  Outside window for withdrawal.  Alcohol cessation emphasized. He states he can't promise he'll stop when he does go home   Hypokalemia/hypomagnesemia, repleted.   Severe debility/deconditioningRelated to ICU stay, prolonged hospitalization.  Acute illness.  CIR vs SNF placement as recommended by PT/OT.  Patient is unsafe for discharge at this time due to he will be alone for multiple hours a day and severe deconditioning.  Inpatient rehab has been consulted-- may be able to rehab there.      DVT prophylaxis:  SCDs Code Status: Full code Family  Communication: [name], significant other updated at bedside Disposition Plan: medically stable for discharge,  currently undergoing inpatient rehab evaluation     [facility] Direct contact: see [email or web address] (further directions at bottom of note if needed) 7PM-7AM contact night coverage as at bottom of note 05/08/2019, 3:40 PM  LOS: 39 days   Consultants  . GI, PCCM   Interval History/Subjective  No events overnight Hopeful to start inpatient rehab  Objective   Vitals:  Vitals:   05/08/19 0608 05/08/19 1322  BP: (!) 121/58 (!) 96/55  Pulse: 81 83  Resp: 18 17  Temp: 98.1 F (36.7 C) 98.3 F (36.8 C)  SpO2: 97% 99%    Exam:  Awake Alert, in bed, oriented X 4, No new F.N deficits, Normal affect Regular rate and rhythm, no murmurs, no rubs, no gallops, scant pitting edema bilateral lower extremities (much improved from prior) Normal respiratory effort on on room air +ve B.Sounds, Abd Soft, No tenderness, , No rebound - guarding or rigidity. Small scab surrounding base of penis and scrotum (at site of previous condom cath), no overt signs of infection      I have personally reviewed the following:   Data Reviewed: Basic Metabolic Panel: Recent Labs  Lab 05/02/19 0333 05/03/19 0557 05/04/19 0343 05/05/19 0354  NA 133* 131* 132* 133*  K 3.9 4.0 3.8 3.9  CL 101 99 102 102  CO2 23 21* 23 21*  GLUCOSE 97 96 105* 97  BUN 13 12 11 14   CREATININE 0.64 0.69 0.92 0.65  CALCIUM 8.3* 8.5* 8.2* 8.3*   Liver Function Tests: Recent Labs  Lab 05/05/19 0354  AST 41  ALT 19  ALKPHOS 215*  BILITOT 2.1*  PROT 6.1*  ALBUMIN 1.8*   No results for input(s): LIPASE, AMYLASE in the last 168 hours. Recent Labs  Lab 05/02/19 0430  AMMONIA 56*   CBC: Recent Labs  Lab 05/03/19 0557 05/04/19 0343 05/05/19 0354 05/06/19 0421 05/07/19 0626  WBC 11.3* 10.4 10.3 10.3 10.3  HGB 7.2* 7.1* 8.1* 8.2* 8.2*  HCT 23.2* 22.8* 25.5* 26.0* 25.6*  MCV 110.0* 111.8* 107.6*  108.3* 108.0*  PLT PLATELET CLUMPS NOTED ON SMEAR, COUNT APPEARS DECREASED PLATELET CLUMPS NOTED ON SMEAR, UNABLE TO ESTIMATE PLATELET CLUMPS NOTED ON SMEAR, COUNT APPEARS ADEQUATE PLATELET CLUMPING, SUGGEST RECOLLECTION OF SAMPLE IN CITRATE TUBE. PLATELET CLUMPS NOTED ON SMEAR, COUNT APPEARS DECREASED   Cardiac Enzymes: No results for input(s): CKTOTAL, CKMB, CKMBINDEX, TROPONINI in the last 168 hours. BNP (last 3 results) No results for input(s): BNP in the last 8760 hours.  ProBNP (last 3 results) No results for input(s): PROBNP in the last 8760 hours.  CBG: No results for input(s): GLUCAP in the last 168 hours.  No results found for this or any previous visit (from the past 240 hour(s)).   Studies: No results found.  Scheduled Meds: . sodium chloride   Intravenous Once  . sodium chloride   Intravenous Once  . Chlorhexidine Gluconate Cloth  6 each Topical Daily  . docusate sodium  100 mg Oral BID  . furosemide  20 mg Oral Daily  . gabapentin  200 mg Oral BID  . Gerhardt's butt cream   Topical TID  . hydrocortisone  25 mg Rectal QHS  . lactulose  10 g Oral Daily  . mouth rinse  15 mL Mouth Rinse BID  . metoprolol tartrate  12.5 mg Oral BID  . miconazole nitrate   Topical BID  .  multivitamin with minerals  1 tablet Oral Daily  . neomycin-bacitracin-polymyxin   Topical Daily  . pantoprazole  40 mg Oral BID  . sodium chloride flush  10-40 mL Intracatheter Q12H  . spironolactone  12.5 mg Oral Daily   Continuous Infusions: . sodium chloride 250 mL (04/19/19 1224)    Principal Problem:   Acute hyperactive alcohol withdrawal delirium (HCC) Active Problems:   Anxiety state   EtOH dependence (HCC)   Hypertensive heart disease without heart failure   S/P drug eluting coronary stent placement   Upper GI bleed   Acute renal failure (HCC)   Alcoholic hepatitis without ascites   Acute hepatic encephalopathy   Encounter for central line placement   Acute hypernatremia    Metabolic acidosis   Thrombocytopenia (HCC)   Left arm swelling   Hypomagnesemia   Chronic diastolic CHF (congestive heart failure) (HCC)   Macrocytic anemia   Unspecified atrial fibrillation (HCC)   Iron deficiency anemia due to chronic blood loss   Pancreatic lesion      Desiree Hane  [facility]

## 2019-05-08 NOTE — Progress Notes (Signed)
Occupational Therapy Treatment Patient Details Name: Richard Calhoun MRN: [id number] DOB: 25-Aug-1960 Today's Date: 05/08/2019    History of present illness 58 yo male admitted to ED on 9/10 for R flank pain, ETOH, N/V. Pt requiring intubation 9/13-9/18. Endoscopy reveals nonbleeding erosive gastropathy, duodenal erosions; placed feeding tube. Pt with new diagnosis LLL aspiration pneumonitis, hypoactive delirium. PMH includes anxiety, alcohol abuse, depression, GERD, HTN, HLD, tubular adenoma of colon, CAD with stent placement, PVD.   OT comments  Pt progressing towards acute OT goals. Focus of session was toilet transfer and pericare. Pt with continued B feet pain worse in standing. This session pt tried walking with his sneakers on and reported some reduction in foot pain. D/c plan remains appropriate.    Follow Up Recommendations  CIR(vs SNF if CIR not an option)    Equipment Recommendations  None recommended by OT    Recommendations for Other Services      Precautions / Restrictions Precautions Precautions: Fall Precaution Comments: s/p VDRF + extended LOS in ICU Restrictions Weight Bearing Restrictions: No       Mobility Bed Mobility Overal bed mobility: Needs Assistance Bed Mobility: Supine to Sit;Sit to Supine     Supine to sit: Min guard;Min assist Sit to supine: Min guard   General bed mobility comments: HOB elevated, used bed rail and BUE to power hips to EOB position. min guard for safety, light steadying assist.   Transfers Overall transfer level: Needs assistance Equipment used: Rolling walker (2 wheeled) Transfers: Sit to/from Stand Sit to Stand: Min guard;+2 safety/equipment;Min assist         General transfer comment: mostly min guard for safety. some steadying assist during powerup for last sit>stand from EOB. Wide BOS noted. assist to stabilize rw during sit<>stand as pt preferring to pul up on rw despite cues.     Balance Overall balance assessment:  Needs assistance Sitting-balance support: Feet supported Sitting balance-Leahy Scale: Fair     Standing balance support: Bilateral upper extremity supported Standing balance-Leahy Scale: Poor Standing balance comment: rw for support, wide BOS noted                           ADL either performed or assessed with clinical judgement   ADL Overall ADL's : Needs assistance/impaired                         Toilet Transfer: Minimal assistance;+2 for safety/equipment Toilet Transfer Details (indicate cue type and reason): Plan was to walk to bathroom to toilet. Due to B feet pain pt used BSC in room instead. Pericare completed with min A for throughness. Pt walked back to sit EOB a few minutes, reporting pain and "I'm so out of shape."  Pt then trialed standing and walking in place with his sneakers on and reported some reduction in feet pain with shoes on.  Toileting- Clothing Manipulation and Hygiene: Minimal assistance;Sit to/from stand Toileting - Clothing Manipulation Details (indicate cue type and reason): min A to steady while pt completed pericare in standing then min A for throughness with pt using BUE on walker to steady self.      Functional mobility during ADLs: Minimal assistance;+2 for safety/equipment General ADL Comments: Toilet transfer ultimately to [facility] at other side of room (vs bathroom). Pericare then walked back to EOB. Declined sitting up in recliner at this time but discussed getting up to recliner for lunch.  Vision       Perception     Praxis      Cognition Arousal/Alertness: Awake/alert Behavior During Therapy: WFL for tasks assessed/performed Overall Cognitive Status: Within Functional Limits for tasks assessed                                          Exercises Other Exercises Other Exercises: Discussed theraband exercises and finding the "just right" workout for maximum benefit and least risk for overworking  muscles   Shoulder Instructions       General Comments      Pertinent Vitals/ Pain       Pain Assessment: Faces Faces Pain Scale: Hurts even more Pain Location: B feet in standing position Pain Descriptors / Indicators: Sore;Aching Pain Intervention(s): Repositioned;Limited activity within patient's tolerance;Monitored during session(trialed wearing sneakers, pt reporting this helps)  Home Living                                          Prior Functioning/Environment              Frequency  Min 2X/week        Progress Toward Goals  OT Goals(current goals can now be found in the care plan section)  Progress towards OT goals: Progressing toward goals  Acute Rehab OT Goals Patient Stated Goal: to get stronger and get out of here  OT Goal Formulation: With patient Time For Goal Achievement: 05/13/19 Potential to Achieve Goals: Good ADL Goals Pt Will Perform Grooming: with set-up;sitting Pt Will Perform Upper Body Bathing: with set-up;sitting Pt Will Perform Lower Body Bathing: with mod assist;sit to/from stand Pt Will Perform Upper Body Dressing: with set-up;sitting Pt Will Perform Lower Body Dressing: with mod assist;with adaptive equipment;sit to/from stand Pt Will Transfer to Toilet: with mod assist;stand pivot transfer;bedside commode Pt Will Perform Toileting - Clothing Manipulation and hygiene: with mod assist;sit to/from stand Pt/caregiver will Perform Home Exercise Program: Both right and left upper extremity;Increased strength;With theraband;With Supervision;With written HEP provided Additional ADL Goal #1: Pt will be able to selectively attend to ADL tasks x 8 mins with no cues  Plan Discharge plan remains appropriate    Co-evaluation                 AM-PAC OT "6 Clicks" Daily Activity     Outcome Measure   Help from another person eating meals?: None Help from another person taking care of personal grooming?: A Little Help  from another person toileting, which includes using toliet, bedpan, or urinal?: A Little Help from another person bathing (including washing, rinsing, drying)?: A Lot Help from another person to put on and taking off regular upper body clothing?: A Little Help from another person to put on and taking off regular lower body clothing?: A Lot 6 Click Score: 17    End of Session Equipment Utilized During Treatment: Gait belt;Rolling walker  OT Visit Diagnosis: Unsteadiness on feet (R26.81);Pain;Cognitive communication deficit (R41.841);Muscle weakness (generalized) (M62.81) Pain - part of body: Ankle and joints of foot   Activity Tolerance Patient limited by pain   Patient Left in bed;with call bell/phone within reach;with bed alarm set;with SCD's reapplied   Nurse Communication          Time: [id number] OT Time Calculation (min):  33 min  Charges: OT General Charges $OT Visit: 1 Visit OT Treatments $Self Care/Home Management : 23-37 mins  Tyrone Schimke, OT Acute Rehabilitation Services Pager: [phone] Office: [phone]   Hortencia Pilar 05/08/2019, 12:54 PM

## 2019-05-08 NOTE — Progress Notes (Addendum)
Physical Therapy Treatment Patient Details Name: Richard Calhoun MRN: [id number] DOB: [birth year] Today's Date: 05/08/2019    History of Present Illness 58 yo male admitted to ED on 9/10 for R flank pain, ETOH, N/V. Pt requiring intubation 9/13-9/18. Endoscopy reveals nonbleeding erosive gastropathy, duodenal erosions; placed feeding tube. Pt with new diagnosis LLL aspiration pneumonitis, hypoactive delirium. PMH includes anxiety, alcohol abuse, depression, GERD, HTN, HLD, tubular adenoma of colon, CAD with stent placement, PVD.    PT Comments    Pt demonstrated great motivation today. He is waiting on a call about CIR hopefully today. General bed mobility comments: HOB elevated, used bed rail and BUE to power hips to EOB position. min guard for safety, light steadying assist. General transfer comment: Min gaurd for saftey. Bed raised to help to standing. Pt was able to push up from the bed w/o pulling on RW. General Gait Details: Started with RW. Pt able to ambulate 100 ft +2 assist for saftey due to B LE weakness first using standard RW. Remaining with B platform walker.  Pt feet were feeling better today he was wearing shoes. Recliner following for saftey if knees buckle. After 154ft, switch to B platform Eva walker to cont. gait. Better posture with platform.   Follow Up Recommendations  CIR  SNF if not CIR    Equipment Recommendations  None recommended by PT    Recommendations for Other Services       Precautions / Restrictions Precautions Precautions: Fall Precaution Comments: s/p VDRF + extended LOS in ICU Restrictions Weight Bearing Restrictions: No    Mobility  Bed Mobility Overal bed mobility: Needs Assistance Bed Mobility: Supine to Sit;Sit to Supine     Supine to sit: Min guard Sit to supine: Min guard   General bed mobility comments: HOB elevated, used bed rail and BUE to power hips to EOB position. min guard for safety, light steadying assist.   Transfers Overall  transfer level: Needs assistance Equipment used: Rolling walker (2 wheeled) Transfers: Sit to/from Stand Sit to Stand: Min guard;+2 safety/equipment;Min assist         General transfer comment: Min gaurd for saftey. Bed raised to help to standing. Pt was able to push up from the bed w/o pulling on RW  Ambulation/Gait Ambulation/Gait assistance: Min assist;+2 safety/equipment;Min guard Gait Distance (Feet): 400 Feet Assistive device: Bilateral platform walker;Rolling walker (2 wheeled) Gait Pattern/deviations: Step-through pattern;Shuffle     General Gait Details: Started with RW. Pt able to ambulate 100 ft +2 assist for saftey due to B LE weakness. Pt feet were feeling better today he was wearing shoes. Recliner following for saftey if knees buckle. After 136ft, switch to B platform Eva walker to cont. gait. Better posture with platform.   Stairs             Wheelchair Mobility    Modified Rankin (Stroke Patients Only)       Balance                                            Cognition Arousal/Alertness: Awake/alert Behavior During Therapy: WFL for tasks assessed/performed Overall Cognitive Status: Within Functional Limits for tasks assessed                   Orientation Level: Person;Place;Time;Situation Current Attention Level: Focused   Following Commands: Follows multi-step commands consistently  General Comments: AxO x 4  pleasant  motivated      Exercises      General Comments        Pertinent Vitals/Pain Pain Assessment: Faces Faces Pain Scale: Hurts a little bit Pain Location: Pt feet are better today with shoes Pain Descriptors / Indicators: Sore;Aching Pain Intervention(s): Monitored during session;Repositioned    Home Living                      Prior Function            PT Goals (current goals can now be found in the care plan section)      Frequency    Min 2X/week      PT Plan  Current plan remains appropriate    Co-evaluation              AM-PAC PT "6 Clicks" Mobility   Outcome Measure  Help needed turning from your back to your side while in a flat bed without using bedrails?: A Little Help needed moving from lying on your back to sitting on the side of a flat bed without using bedrails?: A Little Help needed moving to and from a bed to a chair (including a wheelchair)?: A Little Help needed standing up from a chair using your arms (e.g., wheelchair or bedside chair)?: A Little Help needed to walk in hospital room?: A Lot Help needed climbing 3-5 steps with a railing? : Total 6 Click Score: 15    End of Session Equipment Utilized During Treatment: Gait belt Activity Tolerance: Patient tolerated treatment well Patient left: in chair;with call bell/phone within reach;with chair alarm set Nurse Communication: Mobility status PT Visit Diagnosis: Muscle weakness (generalized) (M62.81);Other abnormalities of gait and mobility (R26.89)     Time: 1530-1555 PT Time Calculation (min) (ACUTE ONLY): 25 min  Charges:  $Gait Training: 8-22 mins $Therapeutic Activity: 8-22 mins                     Excell Seltzer, [place] [facility]  Reviewed and agree with above  Rica Koyanagi  PTA Acute  Rehabilitation Services Pager      [phone] Office      [phone]

## 2019-05-09 NOTE — Progress Notes (Signed)
PROGRESS NOTE    Richard Calhoun  [id number] DOB: [birth year] DOA: 03/30/2019 PCP: Charlott Rakes, MD    Brief Narrative:   Richard Calhoun is a 58 y.o. year old male with medical history significant for h/o chronic alcohol abuse, chronic tobacco abuse, CAD status post angioplasty, CHF, dyslipidemia, PVD and paroxysmal tachycardia  who presented on 03/30/2019 with c/ohematemesis and abdominal pain.He had poor oral intake for last 3 days prior to hospitalization. He had worsening left flank pain, and unwitnessed fall, nausea and vomiting for several days.  Patient was admitted with diagnosis of hematemesis due to upper GI bleed medically treated with IV octreotide/IV pantoprazole/Rocephin.  Patient was ultimately intubated due to worsening mentation on 9/13.  Underwent EGD (9/16) found to have esophageal varices with portal hypertensive gastropathy. Patient was extubated on 9/18.  Patient continued to have encephalopathic symptoms presumed initially to be withdrawal symptoms however this is outside of the time range for alcohol withdrawal presumed to be related to ICU acquired delirium.  Course also complicated by new onset atrial fibrillation with RVR.  He required enteral nutrition with tube feeds starting on 9/21 was able to transition to dysphagia 3 diet after speech therapy evaluation.  Patient currently medically stable but significantly weak and debilitated CSW is assisting with SNF placement   Assessment & Plan:   Principal Problem:   Acute hyperactive alcohol withdrawal delirium ([place]) Active Problems:   Anxiety state   EtOH dependence ([place])   Hypertensive heart disease without heart failure   S/P drug eluting coronary stent placement   Upper GI bleed   Acute renal failure (HCC)   Alcoholic hepatitis without ascites   Acute hepatic encephalopathy   Encounter for central line placement   Acute hypernatremia   Metabolic acidosis   Thrombocytopenia (HCC)   Left arm swelling  Hypomagnesemia   Chronic diastolic CHF (congestive heart failure) (HCC)   Macrocytic anemia   Unspecified atrial fibrillation (HCC)   Iron deficiency anemia due to chronic blood loss   Pancreatic lesion  Acute hypoxic respiratory failure likely secondary left lower lobe aspiration pneumonitis, resolved.  On room air, normal respiratory effort.  Continue dysphagia 3 diet per speech therapy recommendations.  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  Supportive care for hepatitis during hospital stay.  Has grade 1 esophageal varices. Compensated on exam.   Hematemesis, resolved. Admitting symptoms. S/p EGD on 9/15 with grade 1 esophageal varices, portal gastropathy, H. pyloryi negative. Completed rocephin course and 72 hours of octreotide on admission  Hepatic encephalopathy, resolved.  Alert and oriented x 3, no asterixis. Has had decrease in sensorium when lactulose discontinued previously, continue daily lactulose, closely monitor stool output.   Atrial fibrillation, normal sinus rhythm, rate controlled.  Continue Lopressor.  Not a candidate for anticoagulation due to falls related to ongoing alcohol use especially in setting of known GI bleeding risks with esophageal varices  Thrombocytopenia, related to alcohol abuse/hepatic steatosis/alcoholic hepatitis/early cirrhosis, stable.  Platelet clumping on lab, noted as stable counts, no active bleeding  Acute on chronic anemia, improved.  Acutely worsening on admission in the setting of upper GI bleed related to esophageal varices/portal hypertensive gastropathy on EGD on admission.  Iron panel consistent with anemia of chronic disease.  Last blood transfusionon 10/16, hgb stable, no active signs of bleeding, continue to closely monitor.  Type 2 diabetes.  A1c within normal limits.  No insulin needed  Pancreatic cystic lesion will need outpatient MRI/MRCP for repeat imaging given incidental  finding on  CT abdomen on admission and poor imaging on MRCP here as patient terminated exam prematurely  Alcohol abuse.  Outside window for withdrawal.  Alcohol cessation emphasized. He states he can't promise he'll stop when he does go home  Hypokalemia/hypomagnesemia, repleted.  Severe debility/deconditioningRelated to ICU stay, prolonged hospitalization., Acute illness.  CIR vs SNF placement as recommended by PT/OT. CIR declined admission on 05/09/2019. Patient is unsafe for discharge at this time due to he will be alone for multiple hours a day and severe deconditioning.  Continue inpatient PT/OT efforts with stair training, as he may need to go home unless a Medicaid SNF bed has been found.   DVT prophylaxis: SCDs Code Status: Full code Family Communication: None present at bedside this morning Disposition Plan: Medically stable for discharge, unsafe discharge home as he will be home alone for multiple hours a day and he has significant debility, case management for SNF.   Consultants:   Gastroenterology  PCCM  Procedures:   EGD 04/04/2019  Antimicrobials:   none   Subjective: Patient seen and examined at bedside, continues to complain of slow progress, continued hospitalization, and urinating on himself.  He has been turned down by CIR today.  Still unsafe discharge as he has to navigate stairs at home alone for multiple hours a day.  No other complaints or concerns at this time.  Denies headache, no fever/chills/night sweats, no chest pain, palpitations, no shortness of breath.  No acute events overnight per nursing staff.  Objective: Vitals:   05/08/19 0608 05/08/19 1322 05/08/19 2053 05/09/19 0639  BP: (!) 121/58 (!) 96/55 112/60 104/63  Pulse: 81 83 88 84  Resp: 18 17 20 20   Temp: 98.1 F (36.7 C) 98.3 F (36.8 C) 98.6 F (37 C) 98.2 F (36.8 C)  TempSrc: Oral Oral Oral Oral  SpO2: 97% 99% 97% 98%  Weight:      Height:        Intake/Output Summary (Last 24 hours) at  05/09/2019 1245 Last data filed at 05/09/2019 1050 Gross per 24 hour  Intake 970 ml  Output 2100 ml  Net -1130 ml   Filed Weights   04/14/19 1816 04/15/19 0414 04/16/19 0357  Weight: 85.6 kg 86.7 kg 85.6 kg    Examination:  General exam: Appears calm and comfortable, speech difficult to understand at times due to phonation Respiratory system: Clear to auscultation. Respiratory effort normal. Cardiovascular system: S1 & S2 heard, RRR. No JVD, murmurs, rubs, gallops or clicks. No pedal edema. Gastrointestinal system: Abdomen is nondistended, soft and nontender. No organomegaly or masses felt. Normal bowel sounds heard. Central nervous system: Alert and oriented. No focal neurological deficits. Extremities: Generalized muscle weakness with atrophy, moves all extremities independently Skin: No rashes, lesions or ulcers Psychiatry: Poor judgment/insight, depressed mood and flat affect    Data Reviewed: I have personally reviewed following labs and imaging studies  CBC: Recent Labs  Lab 05/03/19 0557 05/04/19 0343 05/05/19 0354 05/06/19 0421 05/07/19 0626  WBC 11.3* 10.4 10.3 10.3 10.3  HGB 7.2* 7.1* 8.1* 8.2* 8.2*  HCT 23.2* 22.8* 25.5* 26.0* 25.6*  MCV 110.0* 111.8* 107.6* 108.3* 108.0*  PLT PLATELET CLUMPS NOTED ON SMEAR, COUNT APPEARS DECREASED PLATELET CLUMPS NOTED ON SMEAR, UNABLE TO ESTIMATE PLATELET CLUMPS NOTED ON SMEAR, COUNT APPEARS ADEQUATE PLATELET CLUMPING, SUGGEST RECOLLECTION OF SAMPLE IN CITRATE TUBE. PLATELET CLUMPS NOTED ON SMEAR, COUNT APPEARS DECREASED   Basic Metabolic Panel: Recent Labs  Lab 05/03/19 0557 05/04/19 0343 05/05/19 0354  NA 131* 132* 133*  K 4.0 3.8 3.9  CL 99 102 102  CO2 21* 23 21*  GLUCOSE 96 105* 97  BUN 12 11 14   CREATININE 0.69 0.92 0.65  CALCIUM 8.5* 8.2* 8.3*   GFR: Estimated Creatinine Clearance: 101.4 mL/min (by C-G formula based on SCr of 0.65 mg/dL). Liver Function Tests: Recent Labs  Lab 05/05/19 0354  AST 41   ALT 19  ALKPHOS 215*  BILITOT 2.1*  PROT 6.1*  ALBUMIN 1.8*   No results for input(s): LIPASE, AMYLASE in the last 168 hours. No results for input(s): AMMONIA in the last 168 hours. Coagulation Profile: No results for input(s): INR, PROTIME in the last 168 hours. Cardiac Enzymes: No results for input(s): CKTOTAL, CKMB, CKMBINDEX, TROPONINI in the last 168 hours. BNP (last 3 results) No results for input(s): PROBNP in the last 8760 hours. HbA1C: No results for input(s): HGBA1C in the last 72 hours. CBG: No results for input(s): GLUCAP in the last 168 hours. Lipid Profile: No results for input(s): CHOL, HDL, LDLCALC, TRIG, CHOLHDL, LDLDIRECT in the last 72 hours. Thyroid Function Tests: No results for input(s): TSH, T4TOTAL, FREET4, T3FREE, THYROIDAB in the last 72 hours. Anemia Panel: No results for input(s): VITAMINB12, FOLATE, FERRITIN, TIBC, IRON, RETICCTPCT in the last 72 hours. Sepsis Labs: No results for input(s): PROCALCITON, LATICACIDVEN in the last 168 hours.  No results found for this or any previous visit (from the past 240 hour(s)).       Radiology Studies: No results found.      Scheduled Meds: . sodium chloride   Intravenous Once  . sodium chloride   Intravenous Once  . Chlorhexidine Gluconate Cloth  6 each Topical Daily  . docusate sodium  100 mg Oral BID  . furosemide  20 mg Oral Daily  . gabapentin  200 mg Oral BID  . Gerhardt's butt cream   Topical TID  . hydrocortisone  25 mg Rectal QHS  . lactulose  10 g Oral Daily  . mouth rinse  15 mL Mouth Rinse BID  . metoprolol tartrate  12.5 mg Oral BID  . miconazole nitrate   Topical BID  . multivitamin with minerals  1 tablet Oral Daily  . neomycin-bacitracin-polymyxin   Topical Daily  . pantoprazole  40 mg Oral BID  . sodium chloride flush  10-40 mL Intracatheter Q12H  . spironolactone  12.5 mg Oral Daily   Continuous Infusions: . sodium chloride 250 mL (04/19/19 1224)     LOS: 40 days     Time spent: 34 minutes spent on chart review, discussion with nursing staff, consultants, updating family and interview/physical exam; more than 50% of that time was spent in counseling and/or coordination of care.    Bintou Lafata J [place] DO [facility] Pager [phone] If 7PM-7AM, please contact night-coverage [email or web address] Password [email or web address] 05/09/2019, 12:45 PM

## 2019-05-09 NOTE — TOC Progression Note (Signed)
Transition of Care [facility]) - Progression Note    Patient Details  Name: Richard Calhoun MRN: [id number] Date of Birth: [birth year]  Transition of Care [facility]) CM/SW Contact  Leeroy Cha, RN Phone Number: 05/09/2019, 10:36 AM  Clinical Narrative:    Inquiry made to paula novic case worker about status of application.   Expected Discharge Plan: Skilled Nursing Facility Barriers to Discharge: Continued Medical Work up, Inadequate or no insurance  Expected Discharge Plan and Services Expected Discharge Plan: [place] In-house Referral: Clinical Social Work Discharge Planning Services: CM Consult Post Acute Care Choice: [place] Living arrangements for the past 2 months: Single Family Home Expected Discharge Date: (unknown)                                     Social Determinants of Health (SDOH) Interventions    Readmission Risk Interventions Readmission Risk Prevention Plan 04/04/2019  Transportation Screening Complete  PCP or Specialist Appt within 3-5 Days Not Complete  Not Complete comments Not ready for dc  HRI or [place] Not Complete  HRI or Home Care Consult comments NA  Social Work Consult for [place] Planning/Counseling Not Complete  SW consult not completed comments Pt confused  Palliative Care Screening Not Applicable  Medication Review Press photographer) Complete  Some recent data might be hidden

## 2019-05-09 NOTE — Progress Notes (Signed)
Inpatient Rehabilitation-Admissions Coordinator   Discussed pt's case with admitting PM&R MD, Dr. Posey Pronto. While pt has significant debility he does not have an acute medical need to warrant an IP Rehab stay. Unfortunately we are unable to offer this patient a bed at CIR. I have contacted pt's significant other to discuss this. Rhonda with CM and his bedside RN have been informed as well. I anticipate pt being upset with this news as he was very hopeful for CIR. Because of this I have asked for his CM Suanne Marker and his RN to address this with him in person as I cannot be present at [facility] today. Have stated they can call me if he has any questions and we can discuss via conference call.   I have also contacted PT to notify her that this patient will need to continue to address mobility and initiate stair training per his girlfriend prior to potential DC home, if a Medicaid pending bed at SNF cannot be found.   AC will sign off at this time.   Jhonnie Garner, OTR/L  Rehab Admissions Coordinator  [phone] 05/09/2019 9:51 AM

## 2019-05-09 NOTE — Progress Notes (Signed)
Physical Therapy Treatment Patient Details Name: Richard Calhoun MRN: [id number] DOB: 02-12-1961 Today's Date: 05/09/2019    History of Present Illness 58 yo male admitted to ED on 9/10 for R flank pain, ETOH, N/V. Pt requiring intubation 9/13-9/18. Endoscopy reveals nonbleeding erosive gastropathy, duodenal erosions; placed feeding tube. Pt with new diagnosis LLL aspiration pneumonitis, hypoactive delirium. PMH includes anxiety, alcohol abuse, depression, GERD, HTN, HLD, tubular adenoma of colon, CAD with stent placement, PVD.    PT Comments    Pt upset that "they are not accepting me" for CIR.  "I might as well just go home".  Spoke about SNF but pt is declining.  Pt stated he has 2 walkers at home and a 3:1 from when his "significant other" broke her femur.  Pt declines need for a wheelchair.  Pt is worried about the 3 steps he has to enter home.  Assisted OOB to amb a short distance then took him to stairs to practice.  General stair comments: attempted one step forward and one step backward.  Both unsuccessful.  Pt only able to place R LE up onto step but Pt too weak to push body weight up onto step. Spoke about PTAR transport however pt declined due to cost.  "I will get the Firemen to carry me into house".  Pt asked about exercises .  Will give him a handout on general strengthening TE's he can do at.  Will update LPT.   Follow Up Recommendations  1.  CIR (was not approved) 2.  SNF (pt declines) 3.  HH and HEP     Equipment Recommendations    has a walker and 3:1 at home   Recommendations for Other Services       Precautions / Restrictions Precautions Precautions: Fall Precaution Comments: s/p VDRF + extended LOS in ICU Restrictions Weight Bearing Restrictions: No    Mobility  Bed Mobility Overal bed mobility: Needs Assistance Bed Mobility: Supine to Sit     Supine to sit: Supervision;Min guard     General bed mobility comments: HOB elevated, used bed rail and BUE to  power hips to EOB position. min guard for safety.  Increased self ability.  Transfers Overall transfer level: Needs assistance Equipment used: Rolling walker (2 wheeled) Transfers: Sit to/from Stand Sit to Stand: Supervision;Min guard         General transfer comment: 25% VC's on proper hand placement with increased ability to self perform  Ambulation/Gait Ambulation/Gait assistance: Min guard Gait Distance (Feet): 85 Feet Assistive device: Rolling walker (2 wheeled) Gait Pattern/deviations: Step-through pattern;Shuffle Gait velocity: decreased   General Gait Details: No longer using B platform walker, progressed pt to RW so with decreased distance.  25% VC's on proper walker to self distance and upright posture.   Stairs Stairs: Yes Stairs assistance: Max assist;Total assist;+2 physical assistance;+2 safety/equipment Stair Management: No rails;Backwards;Forwards;With walker Number of Stairs: 1 General stair comments: attempted one step forward and one step backward.  Both unsuccessful.  Pt only able to place R LE up onto step but Pt too weak to push body weight up onto step.   Wheelchair Mobility    Modified Rankin (Stroke Patients Only)       Balance                                            Cognition Arousal/Alertness: Awake/alert Behavior During  Therapy: WFL for tasks assessed/performed Overall Cognitive Status: Within Functional Limits for tasks assessed                                 General Comments: AxO x 4  pleasant  motivated      Exercises      General Comments        Pertinent Vitals/Pain Pain Assessment: Faces Faces Pain Scale: Hurts a little bit Pain Location: B feet but "getting better" Pain Descriptors / Indicators: Sore;Aching Pain Intervention(s): Monitored during session;Repositioned    Home Living                      Prior Function            PT Goals (current goals can now be found  in the care plan section) Progress towards PT goals: Progressing toward goals    Frequency    Min 2X/week      PT Plan Current plan remains appropriate    Co-evaluation              AM-PAC PT "6 Clicks" Mobility   Outcome Measure  Help needed turning from your back to your side while in a flat bed without using bedrails?: A Little Help needed moving from lying on your back to sitting on the side of a flat bed without using bedrails?: A Little Help needed moving to and from a bed to a chair (including a wheelchair)?: A Little Help needed standing up from a chair using your arms (e.g., wheelchair or bedside chair)?: A Little Help needed to walk in hospital room?: A Lot Help needed climbing 3-5 steps with a railing? : Total 6 Click Score: 15    End of Session Equipment Utilized During Treatment: Gait belt Activity Tolerance: Patient tolerated treatment well Patient left: in chair;with call bell/phone within reach;with chair alarm set Nurse Communication: Mobility status PT Visit Diagnosis: Muscle weakness (generalized) (M62.81);Other abnormalities of gait and mobility (R26.89)     Time: [id number] PT Time Calculation (min) (ACUTE ONLY): 40 min  Charges:  $Gait Training: 23-37 mins $Therapeutic Activity: 8-22 mins                     Rica Koyanagi  PTA Acute  Rehabilitation Services Pager      [phone] Office      [phone]

## 2019-05-10 LAB — HEMOGLOBIN AND HEMATOCRIT, BLOOD
HCT: 27.5 % — ABNORMAL LOW (ref 39.0–52.0)
Hemoglobin: 8.5 g/dL — ABNORMAL LOW (ref 13.0–17.0)

## 2019-05-10 MED ORDER — TRAMADOL HCL 50 MG PO TABS
50.0000 mg | ORAL_TABLET | Freq: Four times a day (QID) | ORAL | Status: DC | PRN
  Administered 2019-05-10 – 2019-05-12 (×5): 100 mg via ORAL
  Administered 2019-05-14 – 2019-05-15 (×2): 50 mg via ORAL
  Administered 2019-05-16 – 2019-05-17 (×2): 100 mg via ORAL
  Administered 2019-05-19: 50 mg via ORAL
  Administered 2019-05-19 – 2019-05-20 (×2): 100 mg via ORAL
  Administered 2019-05-20: 50 mg via ORAL
  Administered 2019-05-21 – 2019-05-24 (×4): 100 mg via ORAL
  Filled 2019-05-10: qty 2
  Filled 2019-05-10: qty 1
  Filled 2019-05-10 (×8): qty 2
  Filled 2019-05-10: qty 1
  Filled 2019-05-10 (×3): qty 2
  Filled 2019-05-10: qty 1
  Filled 2019-05-10: qty 2
  Filled 2019-05-10 (×2): qty 1

## 2019-05-10 NOTE — Progress Notes (Signed)
Physical Therapy Treatment Patient Details Name: Richard Calhoun MRN: [id number] DOB: February 20, 1961 Today's Date: 05/10/2019    History of Present Illness 58 yo male admitted to ED on 9/10 for R flank pain, ETOH, N/V. Pt requiring intubation 9/13-9/18. Endoscopy reveals nonbleeding erosive gastropathy, duodenal erosions; placed feeding tube. Pt with new diagnosis LLL aspiration pneumonitis, hypoactive delirium. PMH includes anxiety, alcohol abuse, depression, GERD, HTN, HLD, tubular adenoma of colon, CAD with stent placement, PVD.    PT Comments    Pt demonstrated great motivation today. Pt was able to ambulate and perform bed mobility with min assistance.  General bed mobility comments: HOB elevated, used bed rail and BUE to power hips to EOB position. min guard for safety.  Increased self ability. General transfer comment: Pt required minimal VC's for safety and hand placement General Gait Details: Pt able to ambulate 421ft with RW, required a few rest breaks. Pt walking faster than normal. Min VC's for proper posture and distance to walker   Follow Up Recommendations  CIR;SNF     Equipment Recommendations  None recommended by PT    Recommendations for Other Services       Precautions / Restrictions Precautions Precautions: Fall Precaution Comments: s/p VDRF + extended LOS in ICU Restrictions Weight Bearing Restrictions: No    Mobility  Bed Mobility Overal bed mobility: Modified Independent Bed Mobility: Supine to Sit     Supine to sit: Supervision;Min guard Sit to supine: Min guard   General bed mobility comments: HOB elevated, used bed rail and BUE to power hips to EOB position. min guard for safety.  Increased self ability.  Transfers Overall transfer level: Needs assistance Equipment used: Rolling walker (2 wheeled) Transfers: Sit to/from Stand Sit to Stand: Supervision;Min guard         General transfer comment: Pt required minimal VC's for safety and hand  placement  Ambulation/Gait Ambulation/Gait assistance: Min guard Gait Distance (Feet): 400 Feet Assistive device: Rolling walker (2 wheeled) Gait Pattern/deviations: Step-through pattern Gait velocity: increased   General Gait Details: Pt able to ambulate 484ft with RW, required a few rest breaks. Pt walking faster than normal. Min VC's for proper posture and distance to walker   Stairs             Wheelchair Mobility    Modified Rankin (Stroke Patients Only)       Balance                                            Cognition Arousal/Alertness: Awake/alert Behavior During Therapy: WFL for tasks assessed/performed Overall Cognitive Status: Within Functional Limits for tasks assessed                   Orientation Level: Person;Place;Time;Situation Current Attention Level: Focused   Following Commands: Follows multi-step commands consistently   Awareness: Intellectual   General Comments: AxO x 4  pleasant  motivated      Exercises General Exercises - Upper Extremity Shoulder Flexion: AROM;Both;10 reps;Seated Chair Push Up: AROM;Both;10 reps;Seated General Exercises - Lower Extremity Ankle Circles/Pumps: Both;20 reps;Supine Quad Sets: Strengthening;Both;10 reps;Supine Gluteal Sets: Both;AROM;10 reps;Supine Short Arc Quad: AROM;Both;10 reps;Supine Heel Slides: Both;Supine;10 reps;AROM Hip ABduction/ADduction: AROM;Both;Supine;10 reps    General Comments        Pertinent Vitals/Pain Pain Assessment: No/denies pain Pain Location: Pt stated his legs and feet were feeling pretty good today  Pain Intervention(s): Monitored during session;Repositioned    Home Living                      Prior Function            PT Goals (current goals can now be found in the care plan section) Acute Rehab PT Goals Patient Stated Goal: to get stronger and get out of here  Progress towards PT goals: Progressing toward goals     Frequency    Min 2X/week      PT Plan Current plan remains appropriate    Co-evaluation              AM-PAC PT "6 Clicks" Mobility   Outcome Measure  Help needed turning from your back to your side while in a flat bed without using bedrails?: A Little Help needed moving from lying on your back to sitting on the side of a flat bed without using bedrails?: A Little Help needed moving to and from a bed to a chair (including a wheelchair)?: A Little Help needed standing up from a chair using your arms (e.g., wheelchair or bedside chair)?: A Little Help needed to walk in hospital room?: A Little Help needed climbing 3-5 steps with a railing? : Total 6 Click Score: 16    End of Session Equipment Utilized During Treatment: Gait belt Activity Tolerance: Patient tolerated treatment well Patient left: in chair;with call bell/phone within reach;with chair alarm set Nurse Communication: Mobility status PT Visit Diagnosis: Muscle weakness (generalized) (M62.81);Other abnormalities of gait and mobility (R26.89)     Time: [id number] PT Time Calculation (min) (ACUTE ONLY): 26 min  Charges:  $Gait Training: 8-22 mins $Therapeutic Exercise: 8-22 mins                     Excell Seltzer, [place] Acute Rehab

## 2019-05-10 NOTE — Progress Notes (Signed)
PROGRESS NOTE  Richard Calhoun [id number] DOB: May 24, 1961 DOA: 03/30/2019 PCP: Richard Rakes, MD  Brief History   Richard Burly Petersis a 58 y.o.year old malewith medical history significant for h/o chronic alcohol abuse, chronic tobacco abuse, CAD status post angioplasty, CHF, dyslipidemia, PVD and paroxysmal tachycardia who presented on 9/11/2020with c/ohematemesis and abdominal pain.He had poor oral intake for last 3 days prior to hospitalization. He had worsening left flank pain, and unwitnessed fall, nausea and vomiting for several days.  Patient was admitted with diagnosis of hematemesis due to upper GI bleed medically treated with IV octreotide/IV pantoprazole/Rocephin. Patient was ultimately intubated due to worsening mentation on 9/13. Underwent EGD (9/16) found to have esophageal varices with portal hypertensive gastropathy. Patient was extubated on 9/18. Patient continued to have encephalopathic symptoms presumed initially to be withdrawal symptoms however this is outside of the time range for alcohol withdrawal presumed to be related to ICU acquired delirium. Course also complicated by new onset atrial fibrillation with RVR. He required enteral nutrition with tube feeds starting on 9/21 was able to transition to dysphagia 3 diet after speech therapy evaluation. Patient currently medically stable but significantly weak and debilitated CSW is assisting with SNF placement. It appears that the patient may have declined SNF placement yesterday, but he is unable to climb stairs. I have discussed the patient with his significant other, Richard Calhoun. She tells me that they have had a hot water heater leak, and they are having to redo all of their first level floors. For that reason, the patient will not be a safe discharge to home, until this problem is resolved or he goes to SNF for rehab. CIR has stated that he does not meet criteria for placement there.   Consultants  . Nephrology . PCCM .  Gastroenterology  Procedures  . EGD 04/04/2019  Antibiotics   Anti-infectives (From admission, onward)   Start     Dose/Rate Route Frequency Ordered Stop   04/14/19 1800  cefTRIAXone (ROCEPHIN) 2 g in sodium chloride 0.9 % 100 mL IVPB  Status:  Discontinued     2 g 200 mL/hr over 30 Minutes Intravenous Every 24 hours 04/14/19 1735 04/15/19 0936   04/14/19 1800  metroNIDAZOLE (FLAGYL) IVPB 500 mg  Status:  Discontinued     500 mg 100 mL/hr over 60 Minutes Intravenous Every 8 hours 04/14/19 1735 04/15/19 0936   04/05/19 0600  vancomycin (VANCOCIN) IVPB 1000 mg/200 mL premix  Status:  Discontinued     1,000 mg 200 mL/hr over 60 Minutes Intravenous Every 12 hours 04/04/19 1721 04/05/19 0944   04/04/19 1800  cefTRIAXone (ROCEPHIN) 2 g in sodium chloride 0.9 % 100 mL IVPB     2 g 200 mL/hr over 30 Minutes Intravenous Every 24 hours 04/04/19 1710 04/08/19 1736   04/04/19 1800  vancomycin (VANCOCIN) 2,000 mg in sodium chloride 0.9 % 500 mL IVPB     2,000 mg 250 mL/hr over 120 Minutes Intravenous  Once 04/04/19 1721 04/04/19 2000    .  Interval History/Subjective  The patient is resting comfortably. He is very unhappy that he is still here and is frustrated by not being able to go to inpatient rehab. SNF placement is pending and is his only safe discharge until his floor situation is resolved, as he is unable to climb stairs.  Objective   Vitals:  Vitals:   05/10/19 0611 05/10/19 1433  BP: 111/65 105/61  Pulse: 88 82  Resp: 18 16  Temp: 98.2 F (36.8 C)  98 F (36.7 C)  SpO2: 99% 99%    Exam: Exam:  Constitutional:  . The patient is awake, alert, and oriented x 3. No acute distress. Respiratory:  . No increased work of breathing. . No wheezes, rales, or rhonchi . No tactile fremitus Cardiovascular:  . Regular rate and rhythm . No murmurs, ectopy, or gallups. . No lateral PMI. No thrills. Abdomen:  . Abdomen is soft, non-tender, non-distended . No hernias, masses, or  organomegaly . Normoactive bowel sounds.  Musculoskeletal:  . No cyanosis, clubbing, or edema Skin:  . No rashes, lesions, ulcers . palpation of skin: no induration or nodules Neurologic:  . CN 2-12 intact . Sensation all 4 extremities intact Psychiatric:  . Mental status o Mood, affect appropriate o Orientation to person, place, time  . judgment and insight appear intact I have personally reviewed the following:   Today's Data  . Vitals, Hemoglobin/Hematocrit, PT note  Scheduled Meds: . sodium chloride   Intravenous Once  . sodium chloride   Intravenous Once  . Chlorhexidine Gluconate Cloth  6 each Topical Daily  . docusate sodium  100 mg Oral BID  . furosemide  20 mg Oral Daily  . gabapentin  200 mg Oral BID  . Richard Calhoun's butt cream   Topical TID  . hydrocortisone  25 mg Rectal QHS  . lactulose  10 g Oral Daily  . mouth rinse  15 mL Mouth Rinse BID  . metoprolol tartrate  12.5 mg Oral BID  . miconazole nitrate   Topical BID  . multivitamin with minerals  1 tablet Oral Daily  . neomycin-bacitracin-polymyxin   Topical Daily  . pantoprazole  40 mg Oral BID  . sodium chloride flush  10-40 mL Intracatheter Q12H  . spironolactone  12.5 mg Oral Daily   Continuous Infusions: . sodium chloride 250 mL (04/19/19 1224)    Principal Problem:   Acute hyperactive alcohol withdrawal delirium (HCC) Active Problems:   Anxiety state   EtOH dependence (HCC)   Hypertensive heart disease without heart failure   S/P drug eluting coronary stent placement   Upper GI bleed   Acute renal failure (HCC)   Alcoholic hepatitis without ascites   Acute hepatic encephalopathy   Encounter for central line placement   Acute hypernatremia   Metabolic acidosis   Thrombocytopenia (HCC)   Left arm swelling   Hypomagnesemia   Chronic diastolic CHF (congestive heart failure) (HCC)   Macrocytic anemia   Unspecified atrial fibrillation (HCC)   Iron deficiency anemia due to chronic blood loss    Pancreatic lesion   LOS: 41 days   A & P   Acute hypoxic respiratory failure likely secondary left lower lobe aspiration pneumonitis: Resolved. The patient is saturating in the mid-nineties on room air, normal respiratory effort. Continue dysphagia 3 diet per speech therapy recommendations.  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 Has grade 1 esophageal varices. Compensated on exam.   Hematemesis: Resolved. Admitting symptoms. S/p EGD on 9/15 with grade 1 esophageal varices, portal gastropathy, H. pyloryi negative. Completed rocephin course and 72 hours of octreotide on admission  Hepatic encephalopathy: Resolved.Alert and oriented x 3, no asterixis. Has had decrease in sensorium when lactulose discontinued previously, continue daily lactulose, closely monitor stool output.  Atrial fibrillation, normal sinus rhythm, rate controlled:Continue Lopressor. Not a candidate for anticoagulation due to falls related to ongoing alcohol use especially in setting of known GI bleeding risks with esophageal varices  Thrombocytopenia, related  to alcohol abuse/hepatic steatosis/alcoholic hepatitis/early cirrhosis, stable:Platelet clumping on lab, noted as stable counts, no active bleeding  Acute on chronic anemia: Improving Acutely worsening on admission in the setting of upper GI bleed related to esophageal varices/portal hypertensive gastropathy on EGD on admission. Iron panel consistent with anemia of chronic disease. Last blood transfusionon 10/16, hgb stable, no active signs of bleeding, continue to closely monitor.  Type 2 diabetes:A1c within normal limits. No insulin needed  Pancreaticcysticlesion: The patient will need outpatient MRI/MRCP for repeat imaging given incidental finding on CT abdomen on admission and poor imaging on MRCP here as patient terminated exam prematurely.   Alcohol abuse:Outside window for withdrawal. Alcohol cessation emphasized. He states he can't promise he'll stop when he does go home  Hypokalemia/hypomagnesemia:repleted.  Severe debility/deconditioning/Critical illness myopathy: Related to ICU stay, prolonged hospitalization., Acute illness. CIR has declined acceptance of the patient. Unfortunately the patient is unsafe for discharge to anywhere but SNF due to his inability to manage stairs. The upstairs of his home is the only habitable area of his home due to a hot water heater leak. Patient is also unsafe for discharge at this time as he will be alone for multiple hours a day Continue inpatient PT/OT efforts with stair training, as he may need to go home unless a Medicaid SNF bed has been found.  I have seen and examined this patient myself. I have spent 42 minutes in his evaluation and care.  DVT prophylaxis: SCDs Code Status: Full code Family Communication: None present at bedside this morning Disposition Plan: Medically stable for discharge, unsafe discharge home as he will be home alone for multiple hours a day and he has significant debility, case management for SNF.  Carra Brindley, DO [facility] Direct contact: see [email or web address]  7PM-7AM contact night coverage as above 05/10/2019, 4:08 PM  LOS: 41 days

## 2019-05-10 NOTE — Progress Notes (Signed)
  Speech Language Pathology Treatment: Dysphagia  Patient Details Name: Richard Calhoun MRN: [id number] DOB: 1961-01-31 Today's Date: 05/10/2019 Time: [id number] SLP Time Calculation (min) (ACUTE ONLY): 38 min  Assessment / Plan / Recommendation Clinical Impression  No indication of airway compromise with po offered thus will advance diet to regular in hopes of improved intake and given ongoing improvement with swallowing.   Again addressed RMST which Vinn states he has only completed x2 since last week.  When inquired to reasoning for lack of completion, he advised that he "doesn't see it and forgets".  He advised he is completing his theraband arm exercises however and finds them helpful.  SLP clinically judged appropriateness pressure to tax pt for improvement in function but not cause dizziness.  Pt conducted 8 repetitions of Phillips Respironic PEP set at 8 cmH20 pressure.  Pt performs this with excellent execution - did report sensation of "top of his head being fuzzy" after last repetition.  Thus advised him to conduct only 5 TID.  SlP advised pt that if he does not conduct exercises independently, he will not see improvement in function.  Purpose of RMST reviewed with pt including to strengthen core, respiratory muscles to decrease dyspnea with exertion and possibly improve phonation.  Will continue to see pt once a week as pt assured he will follow exercise regimen to help him achieve his goal of getting stronger in hopes to go home.  Pt does report REFLUX that is not well controlled on his current PPI and Tums.    Voice remains dysphonic and given it's been over one month since extubated, ? Source and/or if ENT referral would be helpful to determine function.      HPI HPI: 58 y.o. year old male admitted 03/30/2019 with reports of worsening left flank pain, unwitnessed fall, nausea and vomiting for several days and episode of hematemesis, concern for upper GI bleed causing AKI. PMH: chronic  alcohol abuse, CAD s/p DES (2018), CHFpEF, HLD. Pt intubated 9/13-18/2020.  Pt has been npo with likely secretion aspiration and has had an NG placed.  NG was removed over the weekend at it was coiled in his nose.MD notified SLP of need for pt to have an evaluation to determine if he can consume po intake due to length of npo, hospital stay.Pt now on a dys3/thin diet with good tolerance.  SLP has been addressing voice/respiratory strength and RMST to initiate.      SLP Plan  Continue with current plan of care       Recommendations  Liquids provided via: Straw;Cup Medication Administration: Whole meds with liquid Supervision: Patient able to self feed Compensations: Minimize environmental distractions;Slow rate;Small sips/bites Postural Changes and/or Swallow Maneuvers: Seated upright 90 degrees;Upright 30-60 min after meal                Oral Care Recommendations: Oral care QID;Staff/trained caregiver to provide oral care Follow up Recommendations: Skilled Nursing facility SLP Visit Diagnosis: Dysphagia, unspecified (R13.10) Plan: Continue with current plan of care       GO                Richard Calhoun 05/10/2019, 4:52 PM  Luanna Salk, [place] SLP [place] Pager [phone] Office [phone]

## 2019-05-11 MED ORDER — LANSOPRAZOLE 15 MG PO CPDR
15.0000 mg | DELAYED_RELEASE_CAPSULE | Freq: Every day | ORAL | Status: DC
  Administered 2019-05-11 – 2019-05-25 (×15): 15 mg via ORAL
  Filled 2019-05-11 (×16): qty 1

## 2019-05-11 MED ORDER — INFLUENZA VAC SPLIT QUAD 0.5 ML IM SUSY
0.5000 mL | PREFILLED_SYRINGE | INTRAMUSCULAR | Status: AC
  Administered 2019-05-13: 0.5 mL via INTRAMUSCULAR
  Filled 2019-05-11: qty 0.5

## 2019-05-11 MED ORDER — ALUM & MAG HYDROXIDE-SIMETH 200-200-20 MG/5ML PO SUSP: 30.0000 mL | ORAL | Status: DC | PRN

## 2019-05-11 MED ORDER — NON FORMULARY: 15.0000 mg | Freq: Every day | Status: DC

## 2019-05-11 NOTE — Progress Notes (Signed)
Physical Therapy Treatment Patient Details Name: Richard Calhoun MRN: [id number] DOB: 21-Aug-1960 Today's Date: 05/11/2019    History of Present Illness 58 yo male admitted to ED on 9/10 for R flank pain, ETOH, N/V. Pt requiring intubation 9/13-9/18. Endoscopy reveals nonbleeding erosive gastropathy, duodenal erosions; placed feeding tube. Pt with new diagnosis LLL aspiration pneumonitis, hypoactive delirium. PMH includes anxiety, alcohol abuse, depression, GERD, HTN, HLD, tubular adenoma of colon, CAD with stent placement, PVD.    PT Comments    Pt was motivated to get up and walk today. Pt stated that his long time girlfriend would be coming tomorrow and asked if she could walk him. We left a note with the nurses to let them know it was okay. General bed mobility comments: HOB elevated, used bed rail and BUE to power hips to EOB position. min guard for safety.  Increased self ability. General transfer comment: Pt required minimal VC's for safety and hand placement  General Gait Details: Pt able to ambulate 46ft with RW, required a few rest breaks  Min VC's for proper posture and distance to walker   Follow Up Recommendations  CIR;SNF     Equipment Recommendations  None recommended by PT    Recommendations for Other Services       Precautions / Restrictions Precautions Precautions: Fall Precaution Comments: s/p VDRF + extended LOS in ICU Restrictions Weight Bearing Restrictions: No    Mobility  Bed Mobility Overal bed mobility: Modified Independent Bed Mobility: Supine to Sit     Supine to sit: Supervision;Min guard     General bed mobility comments: HOB elevated, used bed rail and BUE to power hips to EOB position. min guard for safety.  Increased self ability.  Transfers Overall transfer level: Needs assistance Equipment used: Rolling walker (2 wheeled) Transfers: Sit to/from Stand Sit to Stand: Supervision;Min guard         General transfer comment: Pt required  minimal VC's for safety and hand placement  Ambulation/Gait Ambulation/Gait assistance: Min guard Gait Distance (Feet): 400 Feet Assistive device: Rolling walker (2 wheeled) Gait Pattern/deviations: Step-through pattern     General Gait Details: Pt able to ambulate 461ft with RW, required a few rest breaks  Min VC's for proper posture and distance to walker   Stairs             Wheelchair Mobility    Modified Rankin (Stroke Patients Only)       Balance                                            Cognition Arousal/Alertness: Awake/alert Behavior During Therapy: WFL for tasks assessed/performed Overall Cognitive Status: Within Functional Limits for tasks assessed                   Orientation Level: Person;Place;Time;Situation Current Attention Level: Focused   Following Commands: Follows multi-step commands consistently   Awareness: Intellectual   General Comments: AxO x 4  pleasant  motivated      Exercises General Exercises - Upper Extremity Shoulder Flexion: AROM;Both;10 reps;Seated Chair Push Up: AROM;Both;Seated;15 reps General Exercises - Lower Extremity Ankle Circles/Pumps: Both;20 reps;Seated;AROM Quad Sets: Both;10 reps;AROM;Seated Gluteal Sets: Both;AROM;10 reps;Seated Short Arc Quad: AROM;Both;10 reps;Seated Long Arc Quad: Both;10 reps;Seated;AROM Heel Slides: Both;10 reps;AROM;Seated Hip ABduction/ADduction: AROM;Both;10 reps;Seated    General Comments        Pertinent Vitals/Pain  Pain Assessment: 0-10 Pain Score: 3  Pain Location: Pt stated his feet were a little sore today, pt was also c/o back pain after session Pain Descriptors / Indicators: Sore;Aching Pain Intervention(s): Monitored during session;Repositioned    Home Living                      Prior Function            PT Goals (current goals can now be found in the care plan section) Progress towards PT goals: Progressing toward  goals    Frequency    Min 2X/week      PT Plan Current plan remains appropriate    Co-evaluation              AM-PAC PT "6 Clicks" Mobility   Outcome Measure  Help needed turning from your back to your side while in a flat bed without using bedrails?: A Little Help needed moving from lying on your back to sitting on the side of a flat bed without using bedrails?: A Little Help needed moving to and from a bed to a chair (including a wheelchair)?: A Little Help needed standing up from a chair using your arms (e.g., wheelchair or bedside chair)?: A Little Help needed to walk in hospital room?: A Little Help needed climbing 3-5 steps with a railing? : Total 6 Click Score: 16    End of Session Equipment Utilized During Treatment: Gait belt Activity Tolerance: Patient tolerated treatment well Patient left: in chair;with call bell/phone within reach;with chair alarm set Nurse Communication: Mobility status PT Visit Diagnosis: Muscle weakness (generalized) (M62.81);Other abnormalities of gait and mobility (R26.89)     Time: [id number] PT Time Calculation (min) (ACUTE ONLY): 28 min  Charges:  $Gait Training: 8-22 mins $Therapeutic Exercise: 8-22 mins                     Excell Seltzer, [place] [facility]

## 2019-05-11 NOTE — Progress Notes (Signed)
PROGRESS NOTE  Richard Calhoun [id number] DOB: [birth year] DOA: 03/30/2019 PCP: Charlott Rakes, MD  Brief History   Richard Burly Petersis a 58 y.o.year old malewith medical history significant for h/o chronic alcohol abuse, chronic tobacco abuse, CAD status post angioplasty, CHF, dyslipidemia, PVD and paroxysmal tachycardia who presented on 9/11/2020with c/ohematemesis and abdominal pain.He had poor oral intake for last 3 days prior to hospitalization. He had worsening left flank pain, and unwitnessed fall, nausea and vomiting for several days.  Patient was admitted with diagnosis of hematemesis due to upper GI bleed medically treated with IV octreotide/IV pantoprazole/Rocephin. Patient was ultimately intubated due to worsening mentation on 9/13. Underwent EGD (9/16) found to have esophageal varices with portal hypertensive gastropathy. Patient was extubated on 9/18. Patient continued to have encephalopathic symptoms presumed initially to be withdrawal symptoms however this is outside of the time range for alcohol withdrawal presumed to be related to ICU acquired delirium. Course also complicated by new onset atrial fibrillation with RVR. He required enteral nutrition with tube feeds starting on 9/21 was able to transition to dysphagia 3 diet after speech therapy evaluation. Patient currently medically stable but significantly weak and debilitated CSW is assisting with SNF placement. It appears that the patient may have declined SNF placement yesterday, but he is unable to climb stairs. I have discussed the patient with his significant other, Richard Calhoun. She tells me that they have had a hot water heater leak, and they are having to redo all of their first level floors. For that reason, the patient will not be a safe discharge to home, until this problem is resolved or he goes to SNF for rehab. CIR has stated that he does not meet criteria for placement there.   Consultants  . Nephrology . PCCM .  Gastroenterology  Procedures  . EGD 04/04/2019  Antibiotics   Anti-infectives (From admission, onward)   Start     Dose/Rate Route Frequency Ordered Stop   04/14/19 1800  cefTRIAXone (ROCEPHIN) 2 g in sodium chloride 0.9 % 100 mL IVPB  Status:  Discontinued     2 g 200 mL/hr over 30 Minutes Intravenous Every 24 hours 04/14/19 1735 04/15/19 0936   04/14/19 1800  metroNIDAZOLE (FLAGYL) IVPB 500 mg  Status:  Discontinued     500 mg 100 mL/hr over 60 Minutes Intravenous Every 8 hours 04/14/19 1735 04/15/19 0936   04/05/19 0600  vancomycin (VANCOCIN) IVPB 1000 mg/200 mL premix  Status:  Discontinued     1,000 mg 200 mL/hr over 60 Minutes Intravenous Every 12 hours 04/04/19 1721 04/05/19 0944   04/04/19 1800  cefTRIAXone (ROCEPHIN) 2 g in sodium chloride 0.9 % 100 mL IVPB     2 g 200 mL/hr over 30 Minutes Intravenous Every 24 hours 04/04/19 1710 04/08/19 1736   04/04/19 1800  vancomycin (VANCOCIN) 2,000 mg in sodium chloride 0.9 % 500 mL IVPB     2,000 mg 250 mL/hr over 120 Minutes Intravenous  Once 04/04/19 1721 04/04/19 2000     Interval History/Subjective  The patient is resting comfortably. He is complaining of heartburn. He feels that protonix is not helping. He uses prevacid at home.  Objective   Vitals:  Vitals:   05/11/19 0604 05/11/19 1334  BP: 116/64 121/65  Pulse: 81 86  Resp: 18 18  Temp: 98.3 F (36.8 C) 99.1 F (37.3 C)  SpO2: 97% 100%    Exam: Exam:  Constitutional:  . The patient is awake, alert, and oriented x 3. No acute  distress. Respiratory:  . No increased work of breathing. . No wheezes, rales, or rhonchi . No tactile fremitus Cardiovascular:  . Regular rate and rhythm . No murmurs, ectopy, or gallups. . No lateral PMI. No thrills. Abdomen:  . Abdomen is soft, non-tender, non-distended . No hernias, masses, or organomegaly . Normoactive bowel sounds.  Musculoskeletal:  . No cyanosis, clubbing, or edema Skin:  . No rashes, lesions, ulcers  . palpation of skin: no induration or nodules Neurologic:  . CN 2-12 intact . Sensation all 4 extremities intact Psychiatric:  . Mental status o Mood, affect appropriate o Orientation to person, place, time  . judgment and insight appear intact I have personally reviewed the following:   Today's Data  . Vitals, Hemoglobin/Hematocrit, PT note  Scheduled Meds: . sodium chloride   Intravenous Once  . sodium chloride   Intravenous Once  . Chlorhexidine Gluconate Cloth  6 each Topical Daily  . docusate sodium  100 mg Oral BID  . furosemide  20 mg Oral Daily  . gabapentin  200 mg Oral BID  . Gerhardt's butt cream   Topical TID  . hydrocortisone  25 mg Rectal QHS  . [START ON 05/12/2019] influenza vac split quadrivalent PF  0.5 mL Intramuscular Tomorrow-1000  . lactulose  10 g Oral Daily  . lansoprazole  15 mg Oral Q1200  . mouth rinse  15 mL Mouth Rinse BID  . metoprolol tartrate  12.5 mg Oral BID  . miconazole nitrate   Topical BID  . multivitamin with minerals  1 tablet Oral Daily  . neomycin-bacitracin-polymyxin   Topical Daily  . sodium chloride flush  10-40 mL Intracatheter Q12H  . spironolactone  12.5 mg Oral Daily   Continuous Infusions: . sodium chloride 250 mL (04/19/19 1224)    Principal Problem:   Acute hyperactive alcohol withdrawal delirium (HCC) Active Problems:   Anxiety state   EtOH dependence (HCC)   Hypertensive heart disease without heart failure   S/P drug eluting coronary stent placement   Upper GI bleed   Acute renal failure (HCC)   Alcoholic hepatitis without ascites   Acute hepatic encephalopathy   Encounter for central line placement   Acute hypernatremia   Metabolic acidosis   Thrombocytopenia (HCC)   Left arm swelling   Hypomagnesemia   Chronic diastolic CHF (congestive heart failure) (HCC)   Macrocytic anemia   Unspecified atrial fibrillation (HCC)   Iron deficiency anemia due to chronic blood loss   Pancreatic lesion   LOS: 42 days    A & P   Acute hypoxic respiratory failure likely secondary left lower lobe aspiration pneumonitis: Resolved. The patient is saturating in the mid-nineties on room air, normal respiratory effort. Continue dysphagia 3 diet per speech therapy recommendations.  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 Has grade 1 esophageal varices. Compensated on exam.   Hematemesis: Resolved. Admitting symptoms. S/p EGD on 9/15 with grade 1 esophageal varices, portal gastropathy, H. pyloryi negative. Completed rocephin course and 72 hours of octreotide on admission  Hepatic encephalopathy: Resolved.Alert and oriented x 3, no asterixis. Has had decrease in sensorium when lactulose discontinued previously, continue daily lactulose, closely monitor stool output.  Atrial fibrillation, normal sinus rhythm, rate controlled:Continue Lopressor. Not a candidate for anticoagulation due to falls related to ongoing alcohol use especially in setting of known GI bleeding risks with esophageal varices.  GERD/Heartburn: Patient changed from protonix to prevacid which is non-formulary. Mylanta for acute heartburn.  Thrombocytopenia, related to alcohol abuse/hepatic steatosis/alcoholic hepatitis/early cirrhosis, stable:Platelet clumping on lab, noted as stable counts, no active bleeding  Acute on chronic anemia: Improving Acutely worsening on admission in the setting of upper GI bleed related to esophageal varices/portal hypertensive gastropathy on EGD on admission. Iron panel consistent with anemia of chronic disease. Last blood transfusionon 10/16, hgb stable, no active signs of bleeding, continue to closely monitor.  Type 2 diabetes:A1c within normal limits. No insulin needed  Pancreaticcysticlesion: The patient will need outpatient MRI/MRCP for repeat imaging given incidental finding on CT abdomen on  admission and poor imaging on MRCP here as patient terminated exam prematurely.  Alcohol abuse:Outside window for withdrawal. Alcohol cessation emphasized. He states he can't promise he'll stop when he does go home  Hypokalemia/hypomagnesemia:repleted.  Severe debility/deconditioning/Critical illness myopathy: Related to ICU stay, prolonged hospitalization., Acute illness. CIR has declined acceptance of the patient. Unfortunately the patient is unsafe for discharge to anywhere but SNF due to his inability to manage stairs. The upstairs of his home is the only habitable area of his home due to a hot water heater leak. Patient is also unsafe for discharge at this time as he will be alone for multiple hours a day Continue inpatient PT/OT efforts with stair training, as he may need to go home unless a Medicaid SNF bed has been found.  I have seen and examined this patient myself. I have spent 34 minutes in his evaluation and care.  DVT prophylaxis: SCDs Code Status: Full code Family Communication: None present at bedside this morning Disposition Plan: Medically stable for discharge, unsafe discharge home as he will be home alone for multiple hours a day and he has significant debility, case management for SNF.  Richard Machamer, DO [facility] Direct contact: see [email or web address]  7PM-7AM contact night coverage as above 05/11/2019, 4:43 PM  LOS: 41 days

## 2019-05-12 LAB — CBC
HCT: 25.8 % — ABNORMAL LOW (ref 39.0–52.0)
Hemoglobin: 8.1 g/dL — ABNORMAL LOW (ref 13.0–17.0)
MCH: 34.5 pg — ABNORMAL HIGH (ref 26.0–34.0)
MCHC: 31.4 g/dL (ref 30.0–36.0)
MCV: 109.8 fL — ABNORMAL HIGH (ref 80.0–100.0)
RBC: 2.35 MIL/uL — ABNORMAL LOW (ref 4.22–5.81)
RDW: 16.9 % — ABNORMAL HIGH (ref 11.5–15.5)
WBC: 9.8 10*3/uL (ref 4.0–10.5)
nRBC: 0 % (ref 0.0–0.2)

## 2019-05-12 LAB — BASIC METABOLIC PANEL
Anion gap: 8 (ref 5–15)
BUN: 12 mg/dL (ref 6–20)
CO2: 21 mmol/L — ABNORMAL LOW (ref 22–32)
Calcium: 8.1 mg/dL — ABNORMAL LOW (ref 8.9–10.3)
Chloride: 105 mmol/L (ref 98–111)
Creatinine, Ser: 0.64 mg/dL (ref 0.61–1.24)
GFR calc Af Amer: >60 mL/min (ref >60)
GFR calc non Af Amer: >60 mL/min (ref >60)
Glucose, Bld: 106 mg/dL — ABNORMAL HIGH (ref 70–99)
Potassium: 3.7 mmol/L (ref 3.5–5.1)
Sodium: 134 mmol/L — ABNORMAL LOW (ref 135–145)

## 2019-05-12 LAB — MAGNESIUM: Magnesium: 1.4 mg/dL — ABNORMAL LOW (ref 1.7–2.4)

## 2019-05-12 MED ORDER — METHOCARBAMOL 500 MG PO TABS
1000.0000 mg | ORAL_TABLET | Freq: Four times a day (QID) | ORAL | Status: DC | PRN
  Administered 2019-05-15 – 2019-05-16 (×2): 1000 mg via ORAL
  Filled 2019-05-12 (×2): qty 2

## 2019-05-12 MED ORDER — KETOROLAC TROMETHAMINE 30 MG/ML IJ SOLN
15.0000 mg | Freq: Four times a day (QID) | INTRAMUSCULAR | Status: DC
  Administered 2019-05-13: 15 mg via INTRAVENOUS
  Filled 2019-05-12 (×4): qty 1

## 2019-05-12 NOTE — Plan of Care (Signed)
Patient lying in bed this morning; pain controlled at this time. No needs expressed. Wants to walk in hall when his wife gets here. Will continue to monitor.

## 2019-05-12 NOTE — Progress Notes (Signed)
PROGRESS NOTE  Richard Calhoun [id number] DOB: 1960-10-23 DOA: 03/30/2019 PCP: Charlott Rakes, MD  Brief History   Richard Burly Petersis a 58 y.o.year old malewith medical history significant for h/o chronic alcohol abuse, chronic tobacco abuse, CAD status post angioplasty, CHF, dyslipidemia, PVD and paroxysmal tachycardia who presented on 9/11/2020with c/ohematemesis and abdominal pain.He had poor oral intake for last 3 days prior to hospitalization. He had worsening left flank pain, and unwitnessed fall, nausea and vomiting for several days.  Patient was admitted with diagnosis of hematemesis due to upper GI bleed medically treated with IV octreotide/IV pantoprazole/Rocephin. Patient was ultimately intubated due to worsening mentation on 9/13. Underwent EGD (9/16) found to have esophageal varices with portal hypertensive gastropathy. Patient was extubated on 9/18. Patient continued to have encephalopathic symptoms presumed initially to be withdrawal symptoms however this is outside of the time range for alcohol withdrawal presumed to be related to ICU acquired delirium. Course also complicated by new onset atrial fibrillation with RVR. He required enteral nutrition with tube feeds starting on 9/21 was able to transition to dysphagia 3 diet after speech therapy evaluation. Patient currently medically stable but significantly weak and debilitated CSW is assisting with SNF placement. It appears that the patient may have declined SNF placement yesterday, but he is unable to climb stairs. I have discussed the patient with his significant other, Richard Calhoun. She tells me that they have had a hot water heater leak, and they are having to redo all of their first level floors. For that reason, the patient will not be a safe discharge to home, until this problem is resolved or he goes to SNF for rehab. CIR has stated that he does not meet criteria for placement there.   Consultants  . Nephrology . PCCM .  Gastroenterology  Procedures  . EGD 04/04/2019  Antibiotics   Anti-infectives (From admission, onward)   Start     Dose/Rate Route Frequency Ordered Stop   04/14/19 1800  cefTRIAXone (ROCEPHIN) 2 g in sodium chloride 0.9 % 100 mL IVPB  Status:  Discontinued     2 g 200 mL/hr over 30 Minutes Intravenous Every 24 hours 04/14/19 1735 04/15/19 0936   04/14/19 1800  metroNIDAZOLE (FLAGYL) IVPB 500 mg  Status:  Discontinued     500 mg 100 mL/hr over 60 Minutes Intravenous Every 8 hours 04/14/19 1735 04/15/19 0936   04/05/19 0600  vancomycin (VANCOCIN) IVPB 1000 mg/200 mL premix  Status:  Discontinued     1,000 mg 200 mL/hr over 60 Minutes Intravenous Every 12 hours 04/04/19 1721 04/05/19 0944   04/04/19 1800  cefTRIAXone (ROCEPHIN) 2 g in sodium chloride 0.9 % 100 mL IVPB     2 g 200 mL/hr over 30 Minutes Intravenous Every 24 hours 04/04/19 1710 04/08/19 1736   04/04/19 1800  vancomycin (VANCOCIN) 2,000 mg in sodium chloride 0.9 % 500 mL IVPB     2,000 mg 250 mL/hr over 120 Minutes Intravenous  Once 04/04/19 1721 04/04/19 2000     Interval History/Subjective  The patient is complaining of left back pain.   Objective   Vitals:  Vitals:   05/12/19 0559 05/12/19 0829  BP: 122/79   Pulse: 71   Resp: 18   Temp: 98.9 F (37.2 C)   SpO2: 96% 97%    Exam: Exam:  Constitutional:  . The patient is awake, alert, and oriented x 3. Mild distress from left sided back pain. Respiratory:  . No increased work of breathing. . No wheezes,  rales, or rhonchi . No tactile fremitus Cardiovascular:  . Regular rate and rhythm . No murmurs, ectopy, or gallups. . No lateral PMI. No thrills. Abdomen:  . Abdomen is soft, non-tender, non-distended . No hernias, masses, or organomegaly . Normoactive bowel sounds.  Musculoskeletal:  . No cyanosis, clubbing, or edema Skin:  . No rashes, lesions, ulcers . palpation of skin: no induration or nodules Neurologic:  . CN 2-12 intact . Sensation  all 4 extremities intact Psychiatric:  . Mental status o Mood, affect appropriate o Orientation to person, place, time  . judgment and insight appear intact I have personally reviewed the following:   Today's Data  . Vitals, Hemoglobin/Hematocrit, PT note  Scheduled Meds: . sodium chloride   Intravenous Once  . sodium chloride   Intravenous Once  . Chlorhexidine Gluconate Cloth  6 each Topical Daily  . docusate sodium  100 mg Oral BID  . furosemide  20 mg Oral Daily  . gabapentin  200 mg Oral BID  . Gerhardt's butt cream   Topical TID  . hydrocortisone  25 mg Rectal QHS  . influenza vac split quadrivalent PF  0.5 mL Intramuscular Tomorrow-1000  . ketorolac  15 mg Intravenous Q6H  . lactulose  10 g Oral Daily  . lansoprazole  15 mg Oral Q1200  . mouth rinse  15 mL Mouth Rinse BID  . metoprolol tartrate  12.5 mg Oral BID  . miconazole nitrate   Topical BID  . multivitamin with minerals  1 tablet Oral Daily  . neomycin-bacitracin-polymyxin   Topical Daily  . sodium chloride flush  10-40 mL Intracatheter Q12H  . spironolactone  12.5 mg Oral Daily   Continuous Infusions: . sodium chloride 250 mL (04/19/19 1224)    Principal Problem:   Acute hyperactive alcohol withdrawal delirium (HCC) Active Problems:   Anxiety state   EtOH dependence (HCC)   Hypertensive heart disease without heart failure   S/P drug eluting coronary stent placement   Upper GI bleed   Acute renal failure (HCC)   Alcoholic hepatitis without ascites   Acute hepatic encephalopathy   Encounter for central line placement   Acute hypernatremia   Metabolic acidosis   Thrombocytopenia (HCC)   Left arm swelling   Hypomagnesemia   Chronic diastolic CHF (congestive heart failure) (HCC)   Macrocytic anemia   Unspecified atrial fibrillation (HCC)   Iron deficiency anemia due to chronic blood loss   Pancreatic lesion   LOS: 43 days   A & P   Acute hypoxic respiratory failure likely secondary left lower  lobe aspiration pneumonitis: Resolved. The patient is saturating in the mid-nineties on room air, normal respiratory effort. Continue dysphagia 3 diet per speech therapy recommendations.  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 Has grade 1 esophageal varices. Compensated on exam.   Hematemesis: Resolved. Admitting symptoms. S/p EGD on 9/15 with grade 1 esophageal varices, portal gastropathy, H. pyloryi negative. Completed rocephin course and 72 hours of octreotide on admission.  Back pain: Left sided/left flank pain. Toradol added along with Robaxin. Consider renal ultrasound if not improved, although I doubt pyelonephritis as patient is afebrile and without leukocytosis. Monitor creatinine, CBC, and vitals.  Hepatic encephalopathy: Resolved.Alert and oriented x 3, no asterixis. Has had decrease in sensorium when lactulose discontinued previously, continue daily lactulose, closely monitor stool output.  Atrial fibrillation, normal sinus rhythm, rate controlled:Continue Lopressor. Not a candidate for anticoagulation due to falls related to ongoing alcohol use  especially in setting of known GI bleeding risks with esophageal varices.  GERD/Heartburn: Patient changed from protonix to prevacid which is non-formulary. Mylanta for acute heartburn.  Thrombocytopenia, related to alcohol abuse/hepatic steatosis/alcoholic hepatitis/early cirrhosis, stable:Platelet clumping on lab, noted as stable counts, no active bleeding  Acute on chronic anemia: Improving Acutely worsening on admission in the setting of upper GI bleed related to esophageal varices/portal hypertensive gastropathy on EGD on admission. Iron panel consistent with anemia of chronic disease. Last blood transfusionon 10/16, hgb stable, no active signs of bleeding, continue to closely monitor.  Type 2 diabetes:A1c within normal  limits. No insulin needed  Pancreaticcysticlesion: The patient will need outpatient MRI/MRCP for repeat imaging given incidental finding on CT abdomen on admission and poor imaging on MRCP here as patient terminated exam prematurely.  Alcohol abuse:Outside window for withdrawal. Alcohol cessation emphasized. He states he can't promise he'll stop when he does go home  Hypokalemia/hypomagnesemia:repleted.  Severe debility/deconditioning/Critical illness myopathy: Related to ICU stay, prolonged hospitalization., Acute illness. CIR has declined acceptance of the patient. Unfortunately the patient is unsafe for discharge to anywhere but SNF due to his inability to manage stairs. The upstairs of his home is the only habitable area of his home due to a hot water heater leak. Patient is also unsafe for discharge at this time as he will be alone for multiple hours a day Continue inpatient PT/OT efforts with stair training, as he may need to go home unless a Medicaid SNF bed has been found.  I have seen and examined this patient myself. I have spent 36 minutes in his evaluation and care.  DVT prophylaxis: SCDs Code Status: Full code Family Communication: None present at bedside this morning Disposition Plan: Medically stable for discharge, unsafe discharge home as he will be home alone for multiple hours a day and he has significant debility, case management for SNF.  Chiyo Fay, DO [facility] Direct contact: see [email or web address]  7PM-7AM contact night coverage as above 05/12/2019, 2:04 PM  LOS: 41 days

## 2019-05-13 MED ORDER — KETOROLAC TROMETHAMINE 30 MG/ML IJ SOLN
15.0000 mg | Freq: Four times a day (QID) | INTRAMUSCULAR | Status: AC | PRN
  Administered 2019-05-13: 15 mg via INTRAVENOUS
  Filled 2019-05-13: qty 1

## 2019-05-13 MED ORDER — MAGNESIUM SULFATE 2 GM/50ML IV SOLN
2.0000 g | Freq: Once | INTRAVENOUS | Status: AC
  Administered 2019-05-13: 2 g via INTRAVENOUS
  Filled 2019-05-13: qty 50

## 2019-05-13 NOTE — Plan of Care (Signed)
Patient lying in bed this morning; pain controlled. No needs expressed at this time. Will continue to monitor.

## 2019-05-13 NOTE — Progress Notes (Signed)
PROGRESS NOTE  Richard Calhoun [id number] DOB: 08-20-1960 DOA: 03/30/2019 PCP: Charlott Rakes, MD  Brief History   Richard Burly Petersis a 58 y.o.year old malewith medical history significant for h/o chronic alcohol abuse, chronic tobacco abuse, CAD status post angioplasty, CHF, dyslipidemia, PVD and paroxysmal tachycardia who presented on 9/11/2020with c/ohematemesis and abdominal pain.He had poor oral intake for last 3 days prior to hospitalization. He had worsening left flank pain, and unwitnessed fall, nausea and vomiting for several days.  Patient was admitted with diagnosis of hematemesis due to upper GI bleed medically treated with IV octreotide/IV pantoprazole/Rocephin. Patient was ultimately intubated due to worsening mentation on 9/13. Underwent EGD (9/16) found to have esophageal varices with portal hypertensive gastropathy. Patient was extubated on 9/18. Patient continued to have encephalopathic symptoms presumed initially to be withdrawal symptoms however this is outside of the time range for alcohol withdrawal presumed to be related to ICU acquired delirium. Course also complicated by new onset atrial fibrillation with RVR. He required enteral nutrition with tube feeds starting on 9/21 was able to transition to dysphagia 3 diet after speech therapy evaluation. Patient currently medically stable but significantly weak and debilitated CSW is assisting with SNF placement. It appears that the patient may have declined SNF placement yesterday, but he is unable to climb stairs. I have discussed the patient with his significant other, Richard Calhoun. She tells me that they have had a hot water heater leak, and they are having to redo all of their first level floors. For that reason, the patient will not be a safe discharge to home, until this problem is resolved or he goes to SNF for rehab. CIR has stated that he does not meet criteria for placement there.   Consultants  . Nephrology . PCCM .  Gastroenterology  Procedures  . EGD 04/04/2019  Antibiotics   Anti-infectives (From admission, onward)   Start     Dose/Rate Route Frequency Ordered Stop   04/14/19 1800  cefTRIAXone (ROCEPHIN) 2 g in sodium chloride 0.9 % 100 mL IVPB  Status:  Discontinued     2 g 200 mL/hr over 30 Minutes Intravenous Every 24 hours 04/14/19 1735 04/15/19 0936   04/14/19 1800  metroNIDAZOLE (FLAGYL) IVPB 500 mg  Status:  Discontinued     500 mg 100 mL/hr over 60 Minutes Intravenous Every 8 hours 04/14/19 1735 04/15/19 0936   04/05/19 0600  vancomycin (VANCOCIN) IVPB 1000 mg/200 mL premix  Status:  Discontinued     1,000 mg 200 mL/hr over 60 Minutes Intravenous Every 12 hours 04/04/19 1721 04/05/19 0944   04/04/19 1800  cefTRIAXone (ROCEPHIN) 2 g in sodium chloride 0.9 % 100 mL IVPB     2 g 200 mL/hr over 30 Minutes Intravenous Every 24 hours 04/04/19 1710 04/08/19 1736   04/04/19 1800  vancomycin (VANCOCIN) 2,000 mg in sodium chloride 0.9 % 500 mL IVPB     2,000 mg 250 mL/hr over 120 Minutes Intravenous  Once 04/04/19 1721 04/04/19 2000     Interval History/Subjective  The patient states that his back pain is a little better.  Objective   Vitals:  Vitals:   05/13/19 0623 05/13/19 1316  BP: 125/78 (!) 81/59  Pulse: 74 85  Resp: 18 17  Temp: 97.8 F (36.6 C)   SpO2: 100% 99%   Exam:  Constitutional:  . The patient is awake, alert, and oriented x 3. No acute distress. Respiratory:  . No increased work of breathing. . No wheezes, rales, or rhonchi .  No tactile fremitus Cardiovascular:  . Regular rate and rhythm . No murmurs, ectopy, or gallups. . No lateral PMI. No thrills. Abdomen:  . Abdomen is soft, non-tender, non-distended . No hernias, masses, or organomegaly . Normoactive bowel sounds.  Musculoskeletal:  . No cyanosis, clubbing, or edema Skin:  . No rashes, lesions, ulcers . palpation of skin: no induration or nodules Neurologic:  . CN 2-12 intact . Sensation all 4  extremities intact Psychiatric:  . Mental status o Mood, affect appropriate o Orientation to person, place, time  . judgment and insight appear intact I have personally reviewed the following:   Today's Data  . Vitals, Hemoglobin/Hematocrit, PT note  Scheduled Meds: . sodium chloride   Intravenous Once  . sodium chloride   Intravenous Once  . Chlorhexidine Gluconate Cloth  6 each Topical Daily  . docusate sodium  100 mg Oral BID  . furosemide  20 mg Oral Daily  . gabapentin  200 mg Oral BID  . Gerhardt's butt cream   Topical TID  . hydrocortisone  25 mg Rectal QHS  . ketorolac  15 mg Intravenous Q6H  . lactulose  10 g Oral Daily  . lansoprazole  15 mg Oral Q1200  . mouth rinse  15 mL Mouth Rinse BID  . metoprolol tartrate  12.5 mg Oral BID  . miconazole nitrate   Topical BID  . multivitamin with minerals  1 tablet Oral Daily  . neomycin-bacitracin-polymyxin   Topical Daily  . sodium chloride flush  10-40 mL Intracatheter Q12H  . spironolactone  12.5 mg Oral Daily   Continuous Infusions: . sodium chloride 250 mL (04/19/19 1224)    Principal Problem:   Acute hyperactive alcohol withdrawal delirium (HCC) Active Problems:   Anxiety state   EtOH dependence (HCC)   Hypertensive heart disease without heart failure   S/P drug eluting coronary stent placement   Upper GI bleed   Acute renal failure (HCC)   Alcoholic hepatitis without ascites   Acute hepatic encephalopathy   Encounter for central line placement   Acute hypernatremia   Metabolic acidosis   Thrombocytopenia (HCC)   Left arm swelling   Hypomagnesemia   Chronic diastolic CHF (congestive heart failure) (HCC)   Macrocytic anemia   Unspecified atrial fibrillation (HCC)   Iron deficiency anemia due to chronic blood loss   Pancreatic lesion   LOS: 44 days   A & P   Acute hypoxic respiratory failure likely secondary left lower lobe aspiration pneumonitis: Resolved. The patient is saturating in the  mid-nineties on room air, normal respiratory effort. Continue dysphagia 3 diet per speech therapy recommendations.  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 Has grade 1 esophageal varices. Compensated on exam.   Hematemesis: Resolved. Admitting symptoms. S/p EGD on 9/15 with grade 1 esophageal varices, portal gastropathy, H. pyloryi negative. Completed rocephin course and 72 hours of octreotide on admission.  Back pain: Improved. Left sided/left flank pain. As needed Toradol added along with Robaxin. Consider renal ultrasound if not improved, although I doubt pyelonephritis as patient is afebrile and without leukocytosis. Monitor creatinine, CBC, and vitals.  Hepatic encephalopathy: Resolved.Alert and oriented x 3, no asterixis. Has had decrease in sensorium when lactulose discontinued previously, continue daily lactulose, closely monitor stool output.  Atrial fibrillation, normal sinus rhythm, rate controlled:Continue Lopressor. Not a candidate for anticoagulation due to falls related to ongoing alcohol use especially in setting of known GI bleeding risks with esophageal varices.  GERD/Heartburn:  Patient changed from protonix to prevacid which is non-formulary. Mylanta for acute heartburn.  Thrombocytopenia, related to alcohol abuse/hepatic steatosis/alcoholic hepatitis/early cirrhosis, stable:Platelet clumping on lab, noted as stable counts, no active bleeding  Acute on chronic anemia: Improving Acutely worsening on admission in the setting of upper GI bleed related to esophageal varices/portal hypertensive gastropathy on EGD on admission. Iron panel consistent with anemia of chronic disease. Last blood transfusionon 10/16, hgb stable, no active signs of bleeding, continue to closely monitor.  Type 2 diabetes:A1c within normal limits. No insulin needed   Pancreaticcysticlesion: The patient will need outpatient MRI/MRCP for repeat imaging given incidental finding on CT abdomen on admission and poor imaging on MRCP here as patient terminated exam prematurely.  Alcohol abuse:Outside window for withdrawal. Alcohol cessation emphasized. He states he can't promise he'll stop when he does go home  Hypokalemia/hypomagnesemia:repleted.  Severe debility/deconditioning/Critical illness myopathy: Related to ICU stay, prolonged hospitalization., Acute illness. CIR has declined acceptance of the patient. Unfortunately the patient is unsafe for discharge to anywhere but SNF due to his inability to manage stairs. The upstairs of his home is the only habitable area of his home due to a hot water heater leak. Patient is also unsafe for discharge at this time as he will be alone for multiple hours a day Continue inpatient PT/OT efforts with stair training, as he may need to go home unless a Medicaid SNF bed has been found.  I have seen and examined this patient myself. I have spent 32 minutes in his evaluation and care.  DVT prophylaxis: SCDs Code Status: Full code Family Communication: None present at bedside this morning Disposition Plan: Medically stable for discharge, unsafe discharge home as he will be home alone for multiple hours a day and he has significant debility, case management for SNF.  Drayke Grabel, DO [facility] Direct contact: see [email or web address]  7PM-7AM contact night coverage as above 05/13/2019, 2:41 PM  LOS: 41 days

## 2019-05-14 NOTE — Progress Notes (Signed)
Nutrition Follow-up  INTERVENTION:   -Multivitamin with minerals daily -Magic cupBID with meals, each supplement provides 290 kcal and 9 grams of protein  NUTRITION DIAGNOSIS:   Increased nutrient needs related to acute illness as evidenced by estimated needs.  Ongoing.  GOAL:   Patient will meet greater than or equal to 90% of their needs  Progressing.  MONITOR:   PO intake, Supplement acceptance, Labs, Weight trends  ASSESSMENT:   58 year-old male with medical history of chronic alcohol abuse (half gallon of rum/day), CAD and PCI, unstable angina with stent placement in 2018, CHF, hyperlipidemia, mild peripheral vascular disease, and tachycardia. Patient presented to the ED on 9/11 d/t increasing malaise and increased sleeping duration x5 days, vomiting and poor PO intakes x3 days.  Significant Events: 9/11- admission 9/13- intubation; OGT placement 9/14- initial RD assessment 9/16- OGT removed and replaced 9/18- extubation and OGT removed 9/19- diet advanced from NPO to [place] 9/20- returned to NPO 9/22- NGT placement; TF initiation 9/26- TF stopped; NGT removed 9/28- diet advanced to Dysphagia 3, thin liquids  **RD working remotely**  Patient consumed 50% of breakfast this morning and 0-100% of meals yesterday. Per MD note, pt continues to be stable for discharge, awaiting SNF placement.  No new weight since 9/28. I/Os: -19L since 10/6 UOP 10/19: 2950 ml  Medications: Lasix tablet, Multivitamin with minerals daily, calcium carbonate PRN  Labs reviewed: Low Na, Mg  Diet Order:   Diet Order            Diet regular Room service appropriate? Yes; Fluid consistency: Thin  Diet effective now              EDUCATION NEEDS:   No education needs have been identified at this time  Skin:  Skin Assessment: Reviewed RN Assessment  Last BM:  10/25  Height:   Ht Readings from Last 1 Encounters:  04/14/19 5\' 10"  (1.778 m)    Weight:   Wt Readings from  Last 1 Encounters:  04/16/19 85.6 kg    Ideal Body Weight:  75.4 kg  BMI:  Body mass index is 27.08 kg/m.  Estimated Nutritional Needs:   Kcal:  1900-2100 kcal  Protein:  95-105 grams  Fluid:  >/= 2 L/day  Clayton Bibles, MS, RD, LDN Inpatient Clinical Dietitian Pager: [phone] After Hours Pager: [phone]

## 2019-05-14 NOTE — Progress Notes (Signed)
Occupational Therapy Treatment Patient Details Name: Richard Calhoun MRN: [id number] DOB: 1961-05-03 Today's Date: 05/14/2019    History of present illness 58 yo male admitted to ED on 9/10 for R flank pain, ETOH, N/V. Pt requiring intubation 9/13-9/18. Endoscopy reveals nonbleeding erosive gastropathy, duodenal erosions; placed feeding tube. Pt with new diagnosis LLL aspiration pneumonitis, hypoactive delirium. PMH includes anxiety, alcohol abuse, depression, GERD, HTN, HLD, tubular adenoma of colon, CAD with stent placement, PVD.   OT comments  Pt did sit EOB with OT and stand EOB for grooming task as well as BUE exercise with orange theraband.  Follow Up Recommendations  Home health OT;SNF;Supervision/Assistance - 24 hour(vs SNF if CIR not an option)    Equipment Recommendations  3 in 1 bedside commode    Recommendations for Other Services      Precautions / Restrictions Precautions Precautions: Fall Precaution Comments: s/p VDRF + extended LOS in ICU Restrictions Weight Bearing Restrictions: No       Mobility Bed Mobility Overal bed mobility: Modified Independent Bed Mobility: Supine to Sit;Sit to Supine     Supine to sit: Supervision;Min guard Sit to supine: Min assist   General bed mobility comments: HOB elevated, Pt was able to use BUE w/o bed rails to sit on EOB. Pt min guard for safety  Transfers Overall transfer level: Modified independent Equipment used: Rolling walker (2 wheeled) Transfers: Sit to/from [facility] Sit to Stand: Supervision;Min guard Stand pivot transfers: Min guard;Min assist       General transfer comment: Pt required minimal VC's for safety and hand placement to push up from the bed and not pull on the walker    Balance Overall balance assessment: Needs assistance Sitting-balance support: Feet supported Sitting balance-Leahy Scale: Fair     Standing balance support: Bilateral upper extremity supported Standing  balance-Leahy Scale: Poor Standing balance comment: rw for support, wide BOS noted                           ADL either performed or assessed with clinical judgement   ADL Overall ADL's : Needs assistance/impaired     Grooming: Wash/dry hands;Cueing for safety;Standing;Wash/dry face               Lower Body Dressing: Moderate assistance;Sit to/from stand;Cueing for compensatory techniques;Cueing for safety Lower Body Dressing Details (indicate cue type and reason): socks Toilet Transfer: Minimal assistance;Stand-pivot;BSC   Toileting- Clothing Manipulation and Hygiene: Minimal assistance;Sit to/from stand         General ADL Comments: Pt did perform BUE exercise sitting EOB with orange theraband.  Pt states he does tthese on his own.               Cognition Arousal/Alertness: Awake/alert Behavior During Therapy: WFL for tasks assessed/performed Overall Cognitive Status: Within Functional Limits for tasks assessed                   Orientation Level: Person;Place;Situation;Time Current Attention Level: Focused   Following Commands: Follows multi-step commands consistently   Awareness: Intellectual   General Comments: AxO x 4  pleasant  motivated        Exercises General Exercises - Upper Extremity Shoulder Flexion: AROM;Both;10 reps;Seated;Theraband(orange)            Pertinent Vitals/ Pain       Pain Assessment: No/denies pain Faces Pain Scale: Hurts a little bit Pain Location: Pt stated his feet were a little sore today, but  they felt better after walking Pain Descriptors / Indicators: Sore;Aching Pain Intervention(s): Monitored during session;Repositioned  Home Living Family/patient expects to be discharged to:: Skilled nursing facility                                 Additional Comments: Pt reports he lives with wife.        Prior Functioning/Environment Level of Independence: Independent        Comments: Pt  reports he hasn't worked in 8 years, and hasn't has a Oncologist for many years    Frequency  Min 2X/week        Progress Toward Goals  OT Goals(current goals can now be found in the care plan section)  Progress towards OT goals: Progressing toward goals  Acute Rehab OT Goals Patient Stated Goal: to get stronger and get out of here  OT Goal Formulation: With patient Time For Goal Achievement: 05/13/19 Potential to Achieve Goals: Good  Plan Discharge plan remains appropriate       AM-PAC OT "6 Clicks" Daily Activity     Outcome Measure   Help from another person eating meals?: None Help from another person taking care of personal grooming?: A Little Help from another person toileting, which includes using toliet, bedpan, or urinal?: A Little Help from another person bathing (including washing, rinsing, drying)?: A Lot Help from another person to put on and taking off regular upper body clothing?: A Little Help from another person to put on and taking off regular lower body clothing?: A Lot 6 Click Score: 17    End of Session Equipment Utilized During Treatment: Gait belt;Rolling walker  OT Visit Diagnosis: Unsteadiness on feet (R26.81);Muscle weakness (generalized) (M62.81)   Activity Tolerance Patient tolerated treatment well   Patient Left in bed;with call bell/phone within reach;with bed alarm set;with SCD's reapplied   Nurse Communication          Time: [id number] OT Time Calculation (min): 20 min  Charges: OT General Charges $OT Visit: 1 Visit OT Treatments $Self Care/Home Management : 8-22 mins  Kari Baars, [place] [phone] Office- [phone], Edwena Felty D 05/14/2019, 4:33 PM

## 2019-05-14 NOTE — Progress Notes (Signed)
PROGRESS NOTE    RONEY DURAL  [id number] DOB: 07/20/1960 DOA: 03/30/2019 PCP: Charlott Rakes, MD    Brief Narrative:   [name] a 58 y.o.year old malewith medical history significant for h/o chronic alcohol abuse, chronic tobacco abuse, CAD status post angioplasty, CHF, dyslipidemia, PVD and paroxysmal tachycardia who presented on 9/11/2020with c/ohematemesis and abdominal pain.He had poor oral intake for last 3 days prior to hospitalization. He had worsening left flank pain, and unwitnessed fall, nausea and vomiting for several days.  Patient was admitted with diagnosis of hematemesis due to upper GI bleed medically treated with IV octreotide/IV pantoprazole/Rocephin. Patient was ultimately intubated due to worsening mentation on 9/13. Underwent EGD (9/16) found to have esophageal varices with portal hypertensive gastropathy. Patient was extubated on 9/18. Patient continued to have encephalopathic symptoms presumed initially to be withdrawal symptoms however this is outside of the time range for alcohol withdrawal presumed to be related to ICU acquired delirium. Course also complicated by new onset atrial fibrillation with RVR. He required enteral nutrition with tube feeds starting on 9/21 was able to transition to dysphagia 3 diet after speech therapy evaluation.   Patient currently medically stable but significantly weak and debilitated CSW is assisting with SNF placement; but difficult placement given his lack of insurance.  Patient currently unable to climb stairs and recent hot water heater leak at home with damage to all of the floors on the first level.  Given this, he is unable to discharge home at this time; until repairs were made or SNF placement.  CIR evaluated patient he does not meet criteria for placement at this time in terms of inpatient rehab.   Assessment & Plan:   Principal Problem:   Acute hyperactive alcohol withdrawal delirium (HCC) Active  Problems:   Anxiety state   EtOH dependence ([place])   Hypertensive heart disease without heart failure   S/P drug eluting coronary stent placement   Upper GI bleed   Acute renal failure (HCC)   Alcoholic hepatitis without ascites   Acute hepatic encephalopathy   Encounter for central line placement   Acute hypernatremia   Metabolic acidosis   Thrombocytopenia (HCC)   Left arm swelling   Hypomagnesemia   Chronic diastolic CHF (congestive heart failure) (HCC)   Macrocytic anemia   Unspecified atrial fibrillation (HCC)   Iron deficiency anemia due to chronic blood loss   Pancreatic lesion   Acute hypoxic respiratory failure likely secondary left lower lobe aspiration pneumonitis, resolved.On room air, normal respiratory effort. Continue dysphagia 3 diet per speech therapy recommendations.  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 Has grade 1 esophageal varices. Compensated on exam.   Hematemesis, resolved. Admitting symptoms. S/p EGD on 9/15 with grade 1 esophageal varices, portal gastropathy, H. pyloryi negative. Completed rocephin course and 72 hours of octreotide on admission  Hepatic encephalopathy, resolved.Alert and oriented x 3, no asterixis. Has had decrease in sensorium when lactulose discontinued previously, continue daily lactulose, closely monitor stool output.  Atrial fibrillation, normal sinus rhythm, rate controlled. Continue Lopressor. Not a candidate for anticoagulation due to falls related to ongoing alcohol use especially in setting of known GI bleeding risks with esophageal varices  Thrombocytopenia, related to alcohol abuse/hepatic steatosis/alcoholic hepatitis/early cirrhosis, stable.Platelet clumping on lab, noted as stable counts, no active bleeding  Acute on chronic anemia, improved. Acutely worsening on admission in the setting of upper GI bleed  related to esophageal varices/portal hypertensive gastropathy on EGD on admission.  Iron panel consistent with anemia of chronic disease. Last blood transfusionon 10/16, hgb stable, no active signs of bleeding, continue to closely monitor.  Type 2 diabetes. A1c within normal limits. No insulin needed  Pancreaticcysticlesionwill need outpatient MRI/MRCP for repeat imaging given incidental finding on CT abdomen on admission and poor imaging on MRCP here as patient terminated exam prematurely  Alcohol abuse.Outside window for withdrawal. Alcohol cessation emphasized. He states he can't promise he'll stop when he does go home  Hypokalemia/hypomagnesemia,repleted.  Severe debility/deconditioning secondary to ICU stay, prolonged hospitalization., Acute illness.  CIR vsSNF placement as recommended by PT/OT. CIR declined admission on 05/09/2019.Patient is unsafe for discharge at this time due to he will be alone for multiple hours a day and severe deconditioning; inability to manage stairs and his current residence with only habitable area on the second floor due to a water leak.  Continue inpatient PT/OT efforts with stair training, as he may need to go home unless a Medicaid SNF bed has been found.   DVT prophylaxis: SCDs Code Status: Full code Family Communication: None present at bedside this morning Disposition Plan: Medically stable for discharge, unsafe discharge home as he will be home alone for multiple hours a day and he has significant debility, case management for SNF.   Consultants:   Gastroenterology  PCCM  Procedures:   EGD 04/04/2019  Antimicrobials:   none  Subjective: Patient seen and examined bedside, resting comfortably.  Sleeping but easily arousable.  States has walked slightly more over the past week able to get with assistance to bedside chair.  Overall, continues with severe debility and unable to care for himself.  This is first like  complicated by recent water leak in his first-floor residence; and still unable to climb stairs.  Appetite improving.  No other complaints or concerns at this time.  Denies headache, no fever/chills/night sweats, no chest pain, no palpitations, no shortness of breath.  No acute events overnight per nursing staff.  Objective: Vitals:   05/13/19 2141 05/14/19 0524 05/14/19 1030 05/14/19 1332  BP: 108/69 114/61 (!) 110/59 118/62  Pulse: 80 79 83 93  Resp: 18 16  18   Temp: 98.1 F (36.7 C) 98.6 F (37 C)  98.2 F (36.8 C)  TempSrc: Oral Oral  Oral  SpO2:  97%  99%  Weight:      Height:        Intake/Output Summary (Last 24 hours) at 05/14/2019 1557 Last data filed at 05/14/2019 1332 Gross per 24 hour  Intake 598 ml  Output 1925 ml  Net -1327 ml   Filed Weights   04/14/19 1816 04/15/19 0414 04/16/19 0357  Weight: 85.6 kg 86.7 kg 85.6 kg    Examination:  General exam: Appears calm and comfortable, speech difficult to understand at times due to phonation Respiratory system: Clear to auscultation. Respiratory effort normal. Cardiovascular system: S1 & S2 heard, RRR. No JVD, murmurs, rubs, gallops or clicks. No pedal edema. Gastrointestinal system: Abdomen is nondistended, soft and nontender. No organomegaly or masses felt. Normal bowel sounds heard. Central nervous system: Alert and oriented. No focal neurological deficits. Extremities: Generalized muscle weakness with atrophy, moves all extremities independently Skin: No rashes, lesions or ulcers Psychiatry: Poor judgment/insight, depressed mood and flat affect  Data Reviewed: I have personally reviewed following labs and imaging studies  CBC: Recent Labs  Lab 05/10/19 1253 05/12/19 0311  WBC  --  9.8  HGB 8.5* 8.1*  HCT 27.5* 25.8*  MCV  --  109.8*  PLT  --  PLATELET CLUMPING, SUGGEST RECOLLECTION OF SAMPLE IN CITRATE TUBE.   Basic Metabolic Panel: Recent Labs  Lab 05/12/19 0311  NA 134*  K 3.7  CL 105  CO2 21*   GLUCOSE 106*  BUN 12  CREATININE 0.64  CALCIUM 8.1*  MG 1.4*   GFR: Estimated Creatinine Clearance: 101.4 mL/min (by C-G formula based on SCr of 0.64 mg/dL). Liver Function Tests: No results for input(s): AST, ALT, ALKPHOS, BILITOT, PROT, ALBUMIN in the last 168 hours. No results for input(s): LIPASE, AMYLASE in the last 168 hours. No results for input(s): AMMONIA in the last 168 hours. Coagulation Profile: No results for input(s): INR, PROTIME in the last 168 hours. Cardiac Enzymes: No results for input(s): CKTOTAL, CKMB, CKMBINDEX, TROPONINI in the last 168 hours. BNP (last 3 results) No results for input(s): PROBNP in the last 8760 hours. HbA1C: No results for input(s): HGBA1C in the last 72 hours. CBG: No results for input(s): GLUCAP in the last 168 hours. Lipid Profile: No results for input(s): CHOL, HDL, LDLCALC, TRIG, CHOLHDL, LDLDIRECT in the last 72 hours. Thyroid Function Tests: No results for input(s): TSH, T4TOTAL, FREET4, T3FREE, THYROIDAB in the last 72 hours. Anemia Panel: No results for input(s): VITAMINB12, FOLATE, FERRITIN, TIBC, IRON, RETICCTPCT in the last 72 hours. Sepsis Labs: No results for input(s): PROCALCITON, LATICACIDVEN in the last 168 hours.  No results found for this or any previous visit (from the past 240 hour(s)).       Radiology Studies: No results found.      Scheduled Meds: . sodium chloride   Intravenous Once  . sodium chloride   Intravenous Once  . Chlorhexidine Gluconate Cloth  6 each Topical Daily  . docusate sodium  100 mg Oral BID  . furosemide  20 mg Oral Daily  . gabapentin  200 mg Oral BID  . Gerhardt's butt cream   Topical TID  . hydrocortisone  25 mg Rectal QHS  . lactulose  10 g Oral Daily  . lansoprazole  15 mg Oral Q1200  . mouth rinse  15 mL Mouth Rinse BID  . metoprolol tartrate  12.5 mg Oral BID  . miconazole nitrate   Topical BID  . multivitamin with minerals  1 tablet Oral Daily  .  neomycin-bacitracin-polymyxin   Topical Daily  . sodium chloride flush  10-40 mL Intracatheter Q12H  . spironolactone  12.5 mg Oral Daily   Continuous Infusions: . sodium chloride 250 mL (04/19/19 1224)     LOS: 45 days    Time spent: 32 minutes spent on chart review, discussion with nursing staff, consultants, updating family and interview/physical exam; more than 50% of that time was spent in counseling and/or coordination of care.    Eric J [place] DO [facility] 05/14/2019, 3:57 PM

## 2019-05-14 NOTE — Progress Notes (Signed)
Physical Therapy Treatment Patient Details Name: Richard Calhoun MRN: [id number] DOB: [birth year] Today's Date: 05/14/2019    History of Present Illness 58 yo male admitted to ED on 9/10 for R flank pain, ETOH, N/V. Pt requiring intubation 9/13-9/18. Endoscopy reveals nonbleeding erosive gastropathy, duodenal erosions; placed feeding tube. Pt with new diagnosis LLL aspiration pneumonitis, hypoactive delirium. PMH includes anxiety, alcohol abuse, depression, GERD, HTN, HLD, tubular adenoma of colon, CAD with stent placement, PVD.    PT Comments    Pt was motivated during todays treatment to walk and try stairs. General bed mobility comments: HOB elevated, Pt was able to use BUE w/o bed rails to sit on EOB. Pt min guard for safety General transfer comment: Pt required minimal VC's for safety and hand placement to push up from the bed and not pull on the walker General Gait Details: Pt able to ambulate 858ft with RW, required a few rest breaks  Mod VC's for proper posture and distance to walker General stair comments: Pt able to go up 1 stair forward with min assist for balance and safety. Pt knee buckled on the was down and had a near fall. Pt sat down on chair and put feet on floor. Pt was a total assist to come down.   Follow Up Recommendations  CIR;SNF     Equipment Recommendations  None recommended by PT    Recommendations for Other Services       Precautions / Restrictions Precautions Precautions: Fall Precaution Comments: s/p VDRF + extended LOS in ICU Restrictions Weight Bearing Restrictions: No    Mobility  Bed Mobility Overal bed mobility: Modified Independent Bed Mobility: Supine to Sit     Supine to sit: Supervision;Min guard     General bed mobility comments: HOB elevated, Pt was able to use BUE w/o bed rails to sit on EOB. Pt min guard for safety  Transfers Overall transfer level: Modified independent Equipment used: Rolling walker (2 wheeled) Transfers: Sit  to/from Stand Sit to Stand: Supervision;Min guard         General transfer comment: Pt required minimal VC's for safety and hand placement to push up from the bed and not pull on the walker  Ambulation/Gait Ambulation/Gait assistance: Min guard Gait Distance (Feet): 800 Feet Assistive device: Rolling walker (2 wheeled) Gait Pattern/deviations: Step-through pattern     General Gait Details: Pt able to ambulate 850ft with RW, required a few rest breaks  Mod VC's for proper posture and distance to walker   Stairs Stairs: Yes Stairs assistance: Min assist;Total assist Stair Management: No rails;With walker;Forwards Number of Stairs: 1 General stair comments: Pt able to go up 1 stair forward with min assist for balance and safety. Pt knee buckled on the was down and had a near fall. Pt sat down on chair and put feet on floor. Pt was a total assist to come down.   Wheelchair Mobility    Modified Rankin (Stroke Patients Only)       Balance                                            Cognition Arousal/Alertness: Awake/alert Behavior During Therapy: WFL for tasks assessed/performed Overall Cognitive Status: Within Functional Limits for tasks assessed                   Orientation Level: Person;Place;Situation;Time Current Attention Level:  Focused   Following Commands: Follows multi-step commands consistently   Awareness: Intellectual   General Comments: AxO x 4  pleasant  motivated      Exercises General Exercises - Upper Extremity Shoulder Flexion: AROM;Both;10 reps;Seated Chair Push Up: AROM;Both;Seated;5 reps General Exercises - Lower Extremity Ankle Circles/Pumps: Both;Seated;AROM;10 reps Long Arc Quad: Both;10 reps;Seated;AROM Hip Flexion/Marching: AROM;10 reps;Both;Seated    General Comments        Pertinent Vitals/Pain Pain Assessment: Faces Faces Pain Scale: Hurts a little bit Pain Location: Pt stated his feet were a little  sore today, but they felt better after walking Pain Descriptors / Indicators: Sore;Aching Pain Intervention(s): Monitored during session;Repositioned    Home Living                      Prior Function            PT Goals (current goals can now be found in the care plan section) Progress towards PT goals: Progressing toward goals    Frequency    Min 2X/week      PT Plan Current plan remains appropriate    Co-evaluation              AM-PAC PT "6 Clicks" Mobility   Outcome Measure  Help needed turning from your back to your side while in a flat bed without using bedrails?: A Little Help needed moving from lying on your back to sitting on the side of a flat bed without using bedrails?: A Little Help needed moving to and from a bed to a chair (including a wheelchair)?: A Little Help needed standing up from a chair using your arms (e.g., wheelchair or bedside chair)?: A Little Help needed to walk in hospital room?: A Little Help needed climbing 3-5 steps with a railing? : A Lot 6 Click Score: 17    End of Session Equipment Utilized During Treatment: Gait belt Activity Tolerance: Patient tolerated treatment well Patient left: with call bell/phone within reach;Other (comment)(Pt left on bedside commode with call light) Nurse Communication: Mobility status PT Visit Diagnosis: Muscle weakness (generalized) (M62.81);Other abnormalities of gait and mobility (R26.89)     Time: 1530-1555 PT Time Calculation (min) (ACUTE ONLY): 25 min  Charges:  $Gait Training: 8-22 mins $Therapeutic Activity: 8-22 mins                     Excell Seltzer, [place] [facility]

## 2019-05-15 NOTE — Progress Notes (Signed)
Physical Therapy Treatment Patient Details Name: Richard Calhoun MRN: [id number] DOB: 24-May-1961 Today's Date: 05/15/2019    History of Present Illness 58 yo male admitted to ED on 9/10 for R flank pain, ETOH, N/V. Pt requiring intubation 9/13-9/18. Endoscopy reveals nonbleeding erosive gastropathy, duodenal erosions; placed feeding tube. Pt with new diagnosis LLL aspiration pneumonitis, hypoactive delirium. PMH includes anxiety, alcohol abuse, depression, GERD, HTN, HLD, tubular adenoma of colon, CAD with stent placement, PVD.    PT Comments    Pt expressed good motivation today for therapy session. PT was able to attempt stairs, however he remained max assist. General bed mobility comments: HOB elevated, Pt was able to use BUE w/o bed rails to sit on EOB. Pt CGA for safety General transfer comment: Pt required CGA for safety  General Gait Details: Pt able to ambulate 427ft with RW, required a 3 rest breaks  min VC's for proper distance to walker General stair comments: Pt knees and hips were buckling and unable to support full body weight up and down the stairs. Pt used one crutch in L UE and the railing on the R side   Follow Up Recommendations  CIR;SNF     Equipment Recommendations  None recommended by PT    Recommendations for Other Services       Precautions / Restrictions Precautions Precautions: Fall Precaution Comments: s/p VDRF + extended LOS in ICU Restrictions Weight Bearing Restrictions: No    Mobility  Bed Mobility Overal bed mobility: Modified Independent Bed Mobility: Supine to Sit;Sit to Supine     Supine to sit: Supervision;Min guard Sit to supine: Min assist;Min guard   General bed mobility comments: HOB elevated, Pt was able to use BUE w/o bed rails to sit on EOB. Pt CGA for safety  Transfers Overall transfer level: Modified independent Equipment used: Rolling walker (2 wheeled) Transfers: Sit to/from Stand Sit to Stand: Supervision;Min guard          General transfer comment: Pt required CGA for safety  Ambulation/Gait Ambulation/Gait assistance: Min guard Gait Distance (Feet): 400 Feet Assistive device: Rolling walker (2 wheeled) Gait Pattern/deviations: Step-through pattern     General Gait Details: Pt able to ambulate 469ft with RW, required a 3 rest breaks  min VC's for proper distance to walker   Stairs Stairs: Yes Stairs assistance: Max assist Stair Management: One rail Right;Step to pattern;Forwards;With crutches Number of Stairs: 3 General stair comments: Pt knees and hips were buckling and unable to support full body weight up and down the stairs. Pt used one crutch in L UE and the railing on the R side   Wheelchair Mobility    Modified Rankin (Stroke Patients Only)       Balance                                            Cognition Arousal/Alertness: Awake/alert Behavior During Therapy: WFL for tasks assessed/performed Overall Cognitive Status: Within Functional Limits for tasks assessed                                 General Comments: AxO x 4  pleasant  motivated      Exercises General Exercises - Upper Extremity Shoulder Flexion: AROM;Both;10 reps;Seated Chair Push Up: AROM;Both;Seated;5 reps General Exercises - Lower Extremity Ankle Circles/Pumps: Both;Seated;AROM;10 reps Short  Arc Quad: AROM;Both;10 reps;Seated Hip Flexion/Marching: AROM;10 reps;Both;Seated    General Comments        Pertinent Vitals/Pain Pain Assessment: Faces Faces Pain Scale: Hurts a little bit Pain Location: Pt stated his back and neck were hurting from laying in the bed Pain Descriptors / Indicators: Sore;Aching Pain Intervention(s): Monitored during session;Repositioned    Home Living                      Prior Function            PT Goals (current goals can now be found in the care plan section) Progress towards PT goals: Progressing toward goals     Frequency    Min 2X/week      PT Plan Current plan remains appropriate    Co-evaluation              AM-PAC PT "6 Clicks" Mobility   Outcome Measure  Help needed turning from your back to your side while in a flat bed without using bedrails?: A Little Help needed moving from lying on your back to sitting on the side of a flat bed without using bedrails?: A Little Help needed moving to and from a bed to a chair (including a wheelchair)?: A Little Help needed standing up from a chair using your arms (e.g., wheelchair or bedside chair)?: A Little Help needed to walk in hospital room?: A Little Help needed climbing 3-5 steps with a railing? : A Lot 6 Click Score: 17    End of Session Equipment Utilized During Treatment: Gait belt Activity Tolerance: Patient tolerated treatment well Patient left: in bed;with call bell/phone within reach(Nurse notified bed alarm was not working) Nurse Communication: Mobility status;Other (comment)(Nurse notified Bed Alarm was not working) PT Visit Diagnosis: Muscle weakness (generalized) (M62.81);Other abnormalities of gait and mobility (R26.89)     Time: [id number] PT Time Calculation (min) (ACUTE ONLY): 49 min  Charges:  $Gait Training: 23-37 mins $Therapeutic Activity: 8-22 mins                     Excell Seltzer, [place] [facility]
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
## 2019-05-16 NOTE — TOC Progression Note (Signed)
Transition of Care [facility]) - Progression Note    Patient Details  Name: Richard Calhoun MRN: [id number] Date of Birth: 1960-11-01  Transition of Care [facility]) CM/SW Contact  Leeroy Cha, RN Phone Number: 05/16/2019, 10:16 AM  Clinical Narrative:    email sent to Lessie Dings concerning the possible use of the [facility] for patient.   Expected Discharge Plan: Skilled Nursing Facility Barriers to Discharge: Continued Medical Work up, Inadequate or no insurance  Expected Discharge Plan and Services Expected Discharge Plan: [place] In-house Referral: Clinical Social Work Discharge Planning Services: CM Consult Post Acute Care Choice: [place] Living arrangements for the past 2 months: Single Family Home Expected Discharge Date: (unknown)                                     Social Determinants of Health (SDOH) Interventions    Readmission Risk Interventions Readmission Risk Prevention Plan 04/04/2019  Transportation Screening Complete  PCP or Specialist Appt within 3-5 Days Not Complete  Not Complete comments Not ready for dc  HRI or [place] Not Complete  HRI or Home Care Consult comments NA  Social Work Consult for [place] Planning/Counseling Not Complete  SW consult not completed comments Pt confused  Palliative Care Screening Not Applicable  Medication Review [employer]) Complete  Some recent data might be hidden
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
## 2019-05-17 NOTE — Progress Notes (Signed)
Physical Therapy Treatment Patient Details Name: Richard Calhoun MRN: [id number] DOB: 03/05/1961 Today's Date: 05/17/2019    History of Present Illness 58 yo male admitted to ED on 9/10 for R flank pain, ETOH, N/V. Pt requiring intubation 9/13-9/18. Endoscopy reveals nonbleeding erosive gastropathy, duodenal erosions; placed feeding tube. Pt with new diagnosis LLL aspiration pneumonitis, hypoactive delirium. PMH includes anxiety, alcohol abuse, depression, GERD, HTN, HLD, tubular adenoma of colon, CAD with stent placement, PVD.    PT Comments    Pt was motivated to get up and get his legs moving this afternoon. General bed mobility comments: HOB raised General transfer comment: Pt required minimal VC's for proper hand placement and safety General Gait Details: Pt able to ambulate 449ft with RW, required a 2 rest breaks  min VC's for proper distance to walker General LE and UE HEP was given. Pt demonstrated understanding of all exercises.   Follow Up Recommendations  CIR;SNF     Equipment Recommendations  None recommended by PT    Recommendations for Other Services       Precautions / Restrictions Precautions Precautions: Fall Precaution Comments: s/p VDRF + extended LOS in ICU Restrictions Weight Bearing Restrictions: No    Mobility  Bed Mobility Overal bed mobility: Modified Independent Bed Mobility: Supine to Sit;Sit to Supine     Supine to sit: Supervision Sit to supine: Supervision   General bed mobility comments: HOB raised  Transfers Overall transfer level: Modified independent Equipment used: Rolling walker (2 wheeled) Transfers: Sit to/from Stand Sit to Stand: Min guard;Supervision         General transfer comment: Pt required minimal VC's for proper hand placement and safety  Ambulation/Gait Ambulation/Gait assistance: Min guard Gait Distance (Feet): 400 Feet Assistive device: Rolling walker (2 wheeled) Gait Pattern/deviations: Step-through  pattern Gait velocity: increased   General Gait Details: Pt able to ambulate 441ft with RW, required a 2 rest breaks  min VC's for proper distance to walker   Stairs             Wheelchair Mobility    Modified Rankin (Stroke Patients Only)       Balance                                            Cognition Arousal/Alertness: Awake/alert Behavior During Therapy: WFL for tasks assessed/performed Overall Cognitive Status: Within Functional Limits for tasks assessed                                 General Comments: AxO x 4  pleasant  motivated      Exercises General Exercises - Upper Extremity Shoulder Flexion: AROM;Both;10 reps;Seated Chair Push Up: AROM;Both;Seated;10 reps General Exercises - Lower Extremity Ankle Circles/Pumps: Both;AROM;10 reps;Supine Quad Sets: Both;10 reps;AROM;Supine Gluteal Sets: Both;AROM;10 reps;Supine Short Arc Quad: AROM;Both;10 reps;Supine Long Arc Quad: Both;10 reps;Seated;AROM Heel Slides: Both;10 reps;AROM;Supine Hip ABduction/ADduction: AROM;Both;10 reps;Supine    General Comments General comments (skin integrity, edema, etc.): Pt c/o shoulder pain; has been doing theraband on his own. Educated to length band for less resistance vs. just moving arms through ROM until pain decreases      Pertinent Vitals/Pain Pain Assessment: Faces Faces Pain Scale: Hurts a little bit Pain Location: neck and shoulders Pain Descriptors / Indicators: Sore;Aching Pain Intervention(s): Monitored during session;Repositioned  Home Living                      Prior Function            PT Goals (current goals can now be found in the care plan section) Progress towards PT goals: Progressing toward goals    Frequency    Min 2X/week      PT Plan Current plan remains appropriate    Co-evaluation              AM-PAC PT "6 Clicks" Mobility   Outcome Measure  Help needed turning from your  back to your side while in a flat bed without using bedrails?: A Little Help needed moving from lying on your back to sitting on the side of a flat bed without using bedrails?: A Little Help needed moving to and from a bed to a chair (including a wheelchair)?: A Little Help needed standing up from a chair using your arms (e.g., wheelchair or bedside chair)?: A Little Help needed to walk in hospital room?: A Little Help needed climbing 3-5 steps with a railing? : A Lot 6 Click Score: 17    End of Session Equipment Utilized During Treatment: Gait belt Activity Tolerance: Patient tolerated treatment well Patient left: in bed;with call bell/phone within reach;with bed alarm set Nurse Communication: Mobility status PT Visit Diagnosis: Muscle weakness (generalized) (M62.81);Other abnormalities of gait and mobility (R26.89)     Time: 1530-1556 PT Time Calculation (min) (ACUTE ONLY): 26 min  Charges:  $Gait Training: 8-22 mins $Therapeutic Exercise: 8-22 mins                     Excell Seltzer, [place] [facility]

## 2019-05-17 NOTE — Progress Notes (Signed)
Occupational Therapy Treatment and goal update Patient Details Name: Richard Calhoun MRN: [id number] DOB: [birth year] Today's Date: 05/17/2019    History of present illness 58 yo male admitted to ED on 9/10 for R flank pain, ETOH, N/V. Pt requiring intubation 9/13-9/18. Endoscopy reveals nonbleeding erosive gastropathy, duodenal erosions; placed feeding tube. Pt with new diagnosis LLL aspiration pneumonitis, hypoactive delirium. PMH includes anxiety, alcohol abuse, depression, GERD, HTN, HLD, tubular adenoma of colon, CAD with stent placement, PVD.   OT comments  Pt stood at sink to have hair washed.  Fatiques easily.  Pt would benefit from snf, but refuses this.  Follow Up Recommendations  Home health OT;Supervision/Assistance - 24 hour(pt is refusing snf)    Equipment Recommendations  3 in 1 bedside commode    Recommendations for Other Services      Precautions / Restrictions Precautions Precautions: Fall Precaution Comments: s/p VDRF + extended LOS in ICU Restrictions Weight Bearing Restrictions: No       Mobility Bed Mobility         Supine to sit: Supervision Sit to supine: Supervision   General bed mobility comments: HOB raised  Transfers   Equipment used: Rolling walker (2 wheeled)   Sit to Stand: Min guard         General transfer comment: for safety and cues for UE placement    Balance                                           ADL either performed or assessed with clinical judgement   ADL                                         General ADL Comments: Pt stood at sink to have hair washed.  States it hasn't been washed in 50 days.  Pt had neck pain at beginning of session, but felt he could lean over sink from back, which he did. Stood about 4 minutes total with min guard for safety     Vision       Perception     Praxis      Cognition Arousal/Alertness: Awake/alert Behavior During Therapy: WFL for tasks  assessed/performed Overall Cognitive Status: Within Functional Limits for tasks assessed                                          Exercises     Shoulder Instructions       General Comments Pt c/o shoulder pain; has been doing theraband on his own. Educated to length band for less resistance vs. just moving arms through ROM until pain decreases    Pertinent Vitals/ Pain       Pain Assessment: Faces Faces Pain Scale: Hurts little more Pain Location: neck and shoulders Pain Descriptors / Indicators: Sore;Aching Pain Intervention(s): Limited activity within patient's tolerance;Monitored during session;Repositioned  Home Living                                          Prior Functioning/Environment  Frequency           Progress Toward Goals  OT Goals(current goals can now be found in the care plan section)  Progress towards OT goals: Progressing toward goals  Acute Rehab OT Goals Time For Goal Achievement: 05/31/19 ADL Goals Pt Will Perform Grooming: with supervision;standing Pt Will Perform Upper Body Bathing: with set-up;sitting Pt Will Perform Lower Body Bathing: with min assist;with adaptive equipment;sit to/from stand Pt Will Perform Upper Body Dressing: with set-up;sitting Pt Will Perform Lower Body Dressing: with mod assist;with adaptive equipment;sit to/from stand Pt Will Transfer to Toilet: with supervision;bedside commode;ambulating Pt Will Perform Toileting - Clothing Manipulation and hygiene: with min assist;sit to/from stand  Plan      Co-evaluation                 AM-PAC OT "6 Clicks" Daily Activity     Outcome Measure   Help from another person eating meals?: None Help from another person taking care of personal grooming?: A Little Help from another person toileting, which includes using toliet, bedpan, or urinal?: A Little Help from another person bathing (including washing, rinsing,  drying)?: A Lot Help from another person to put on and taking off regular upper body clothing?: A Little Help from another person to put on and taking off regular lower body clothing?: A Lot 6 Click Score: 17    End of Session    OT Visit Diagnosis: Unsteadiness on feet (R26.81);Muscle weakness (generalized) (M62.81)   Activity Tolerance Patient tolerated treatment well   Patient Left in bed;with call bell/phone within reach;with bed alarm set;with SCD's reapplied   Nurse Communication          Time: [id number] OT Time Calculation (min): 17 min  Charges: OT General Charges $OT Visit: 1 Visit OT Treatments $Self Care/Home Management : 8-22 mins  Lesle Chris, OTR/L Acute Rehabilitation Services [phone] [place] pager [phone] office 05/17/2019   [place] 05/17/2019, 1:46 PM
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
## 2019-05-18 NOTE — Progress Notes (Signed)
Physical Therapy Treatment Patient Details Name: Richard Calhoun MRN: [id number] DOB: February 16, 1961 Today's Date: 05/18/2019    History of Present Illness 58 yo male admitted to ED on 9/10 for R flank pain, ETOH, N/V. Pt requiring intubation 9/13-9/18. Endoscopy reveals nonbleeding erosive gastropathy, duodenal erosions; placed feeding tube. Pt with new diagnosis LLL aspiration pneumonitis, hypoactive delirium. PMH includes anxiety, alcohol abuse, depression, GERD, HTN, HLD, tubular adenoma of colon, CAD with stent placement, PVD.    PT Comments    Per chart review and discussion with pt/spouse.  Pt now plans to D/C to home [facility]) which bedrooms are on the second level.  Pt is progressing however very slowly.  Truly needs ST Rehab at SNF however has had no bed offers.   Performed some bed level TE's, assisted to [facility] then amb in hallway.  Assisted back to bed.  Will update LPT and continue working with him to regain strength and mobility.   Follow Up Recommendations  Home health PT     Equipment Recommendations  Rolling walker with 5" wheels;3in1 (PT)    Recommendations for Other Services       Precautions / Restrictions Precautions Precautions: Fall Precaution Comments: s/p VDRF + extended LOS in ICU Restrictions Weight Bearing Restrictions: No    Mobility  Bed Mobility   Bed Mobility: Supine to Sit;Sit to Supine     Supine to sit: Supervision Sit to supine: Supervision   General bed mobility comments: HOB raised and use of rail  Transfers Overall transfer level: Needs assistance Equipment used: Rolling walker (2 wheeled) Transfers: Sit to/from Stand Sit to Stand: Min assist Stand pivot transfers: Min assist       General transfer comment: required increased physical assist due to new onset R UE (elbow) swelling/pain.    Assisted OOB to [facility] then from [facility] to standing for gait.  Ambulation/Gait Ambulation/Gait assistance: Min guard Gait Distance (Feet): 350  Feet Assistive device: Rolling walker (2 wheeled) Gait Pattern/deviations: Step-through pattern Gait velocity: increased   General Gait Details: decreased distance due to increased c/o R UE pain/swelling which increased using walker   Stairs         General stair comments: unable to attempt stairs today due to increased R UE swelling/pain   Wheelchair Mobility    Modified Rankin (Stroke Patients Only)       Balance                                            Cognition Arousal/Alertness: Awake/alert Behavior During Therapy: WFL for tasks assessed/performed Overall Cognitive Status: Within Functional Limits for tasks assessed                                 General Comments: pt getting frustrated with his current situation and extended hospital stay but continue to work with Physical Therapy to progress his mobility      Exercises General Exercises - Lower Extremity Ankle Circles/Pumps: Both;AROM;10 reps;Supine Quad Sets: Both;10 reps;AROM;Supine Long Arc Quad: Both;10 reps;Seated;AROM Heel Slides: Both;10 reps;AROM;Supine Hip ABduction/ADduction: AROM;Both;10 reps;Supine    General Comments        Pertinent Vitals/Pain Pain Assessment: 0-10 Pain Score: 10-Worst pain ever Faces Pain Scale: Hurts whole lot Pain Location: new acute pain R UE (esp elbow) Pain Descriptors / Indicators: Sore;Aching Pain Intervention(s):  Monitored during session;Repositioned;Ice applied;Other (comment)(notified RN)    Home Living                      Prior Function            PT Goals (current goals can now be found in the care plan section) Progress towards PT goals: Progressing toward goals    Frequency    Min 2X/week      PT Plan Current plan remains appropriate    Co-evaluation              AM-PAC PT "6 Clicks" Mobility   Outcome Measure  Help needed turning from your back to your side while in a flat bed without  using bedrails?: A Little Help needed moving from lying on your back to sitting on the side of a flat bed without using bedrails?: A Little Help needed moving to and from a bed to a chair (including a wheelchair)?: A Little Help needed standing up from a chair using your arms (e.g., wheelchair or bedside chair)?: A Little Help needed to walk in hospital room?: A Little Help needed climbing 3-5 steps with a railing? : A Lot 6 Click Score: 17    End of Session Equipment Utilized During Treatment: Gait belt Activity Tolerance: Patient limited by pain Patient left: in bed;with call bell/phone within reach;with bed alarm set Nurse Communication: Mobility status PT Visit Diagnosis: Muscle weakness (generalized) (M62.81);Other abnormalities of gait and mobility (R26.89)     Time: [id number] PT Time Calculation (min) (ACUTE ONLY): 30 min  Charges:  $Gait Training: 8-22 mins $Therapeutic Exercise: 8-22 mins                     Rica Koyanagi  PTA Acute  Rehabilitation Services Pager      [phone] Office      [phone]

## 2019-05-18 NOTE — Progress Notes (Addendum)
Patient RUE and elbow are edematous; Dr [place] paged to make him aware. Moe Brier,RN

## 2019-05-19 ENCOUNTER — Inpatient Hospital Stay (HOSPITAL_COMMUNITY): Payer: Self-pay

## 2019-05-19 DIAGNOSIS — R609 Edema, unspecified: Secondary | ICD-10-CM

## 2019-05-19 LAB — CBC
HCT: 25.1 % — ABNORMAL LOW (ref 39.0–52.0)
Hemoglobin: 8 g/dL — ABNORMAL LOW (ref 13.0–17.0)
MCH: 33.5 pg (ref 26.0–34.0)
MCHC: 31.9 g/dL (ref 30.0–36.0)
MCV: 105 fL — ABNORMAL HIGH (ref 80.0–100.0)
Platelets: ADEQUATE 10*3/uL (ref 150–400)
RBC: 2.39 MIL/uL — ABNORMAL LOW (ref 4.22–5.81)
RDW: 16.6 % — ABNORMAL HIGH (ref 11.5–15.5)
WBC: 8.5 10*3/uL (ref 4.0–10.5)
nRBC: 0 % (ref 0.0–0.2)

## 2019-05-19 LAB — BASIC METABOLIC PANEL
Anion gap: 8 (ref 5–15)
BUN: 10 mg/dL (ref 6–20)
CO2: 21 mmol/L — ABNORMAL LOW (ref 22–32)
Calcium: 8.2 mg/dL — ABNORMAL LOW (ref 8.9–10.3)
Chloride: 104 mmol/L (ref 98–111)
Creatinine, Ser: 0.52 mg/dL — ABNORMAL LOW (ref 0.61–1.24)
GFR calc Af Amer: >60 mL/min (ref >60)
GFR calc non Af Amer: >60 mL/min (ref >60)
Glucose, Bld: 106 mg/dL — ABNORMAL HIGH (ref 70–99)
Potassium: 3.5 mmol/L (ref 3.5–5.1)
Sodium: 133 mmol/L — ABNORMAL LOW (ref 135–145)

## 2019-05-19 LAB — MAGNESIUM: Magnesium: 1.2 mg/dL — ABNORMAL LOW (ref 1.7–2.4)

## 2019-05-19 MED ORDER — MAGNESIUM OXIDE 400 (241.3 MG) MG PO TABS
400.0000 mg | ORAL_TABLET | Freq: Two times a day (BID) | ORAL | Status: DC
  Administered 2019-05-19 – 2019-05-25 (×13): 400 mg via ORAL
  Filled 2019-05-19 (×13): qty 1

## 2019-05-19 MED ORDER — OXYCODONE HCL 5 MG PO TABS
5.0000 mg | ORAL_TABLET | Freq: Three times a day (TID) | ORAL | Status: DC | PRN
  Administered 2019-05-19 – 2019-05-21 (×2): 5 mg via ORAL
  Filled 2019-05-19 (×4): qty 1

## 2019-05-19 NOTE — Progress Notes (Signed)
RUE venous duplex       has been completed. Preliminary results can be found under CV proc through chart review. Lianette Broussard, BS, RDMS, RVT   
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
## 2019-05-20 ENCOUNTER — Inpatient Hospital Stay (HOSPITAL_COMMUNITY): Payer: Self-pay

## 2019-05-20 LAB — SEDIMENTATION RATE: Sed Rate: 139 mm/hr — ABNORMAL HIGH (ref 0–16)

## 2019-05-20 LAB — URIC ACID: Uric Acid, Serum: 6 mg/dL (ref 3.7–8.6)

## 2019-05-20 LAB — C-REACTIVE PROTEIN: CRP: 7.7 mg/dL — ABNORMAL HIGH (ref <1.0)

## 2019-05-20 NOTE — Plan of Care (Signed)
  Problem: Health Behavior/Discharge Planning: Goal: Ability to manage health-related needs will improve Outcome: Progressing   Problem: Clinical Measurements: Goal: Ability to maintain clinical measurements within normal limits will improve Outcome: Progressing Goal: Respiratory complications will improve Outcome: Progressing Goal: Cardiovascular complication will be avoided Outcome: Progressing   Problem: Activity: Goal: Risk for activity intolerance will decrease Outcome: Progressing   Problem: Nutrition: Goal: Adequate nutrition will be maintained Outcome: Progressing   Problem: Elimination: Goal: Will not experience complications related to bowel motility Outcome: Progressing Goal: Will not experience complications related to urinary retention Outcome: Progressing   Problem: Pain Managment: Goal: General experience of comfort will improve Outcome: Progressing   Problem: Safety: Goal: Ability to remain free from injury will improve Outcome: Progressing   Problem: Skin Integrity: Goal: Risk for impaired skin integrity will decrease Outcome: Progressing
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
## 2019-05-21 LAB — PHOSPHORUS: Phosphorus: 3.9 mg/dL (ref 2.5–4.6)

## 2019-05-21 MED ORDER — FUROSEMIDE 10 MG/ML IJ SOLN
40.0000 mg | Freq: Once | INTRAMUSCULAR | Status: AC
  Administered 2019-05-21: 40 mg via INTRAVENOUS
  Filled 2019-05-21: qty 4

## 2019-05-21 NOTE — Progress Notes (Addendum)
Physical Therapy Treatment Patient Details Name: Richard Calhoun MRN: [id number] DOB: 01/05/1961 Today's Date: 05/21/2019    History of Present Illness 58 yo male admitted to ED on 9/10 for R flank pain, ETOH, N/V. Pt requiring intubation 9/13-9/18. Endoscopy reveals nonbleeding erosive gastropathy, duodenal erosions; placed feeding tube. Pt with new diagnosis LLL aspiration pneumonitis, hypoactive delirium. PMH includes anxiety, alcohol abuse, depression, GERD, HTN, HLD, tubular adenoma of colon, CAD with stent placement, PVD.    PT Comments    Pt was having significant pain in R UE. Pt was very upset that he was still in the hospital. General bed mobility comments: HOB raised, pt required mod assit to sit up EOB due to pain in R UE and R LE and the inabilty to WB on R UE General transfer comment: Pt reqired increased time and assist due to pain and inability to WB through R UE. General Gait Details: Pt was only able to go 5 ft with R paltform RW due to pain and swelling in R UE and R LE. After walking pt c/o pain in his neck. Pt required min-mod assit to NWB on R UE and to stedy pt.    Follow Up Recommendations  Home health PT     Equipment Recommendations  Rolling walker with 5" wheels;3in1 (PT)    Recommendations for Other Services       Precautions / Restrictions Precautions Precautions: Fall Precaution Comments: s/p VDRF + extended LOS in ICU Restrictions Weight Bearing Restrictions: No    Mobility  Bed Mobility Overal bed mobility: Needs Assistance Bed Mobility: Supine to Sit;Sit to Supine     Supine to sit: Mod assist Sit to supine: Mod assist   General bed mobility comments: HOB raised, pt required mod assit to sit up EOB due to pain in R UE and R LE and the inabilty to WB on R UE  Transfers Overall transfer level: Needs assistance Equipment used: Rolling walker (2 wheeled) Transfers: Sit to/from Stand Sit to Stand: Mod assist         General transfer comment:  Pt reqired increased time and assist due to pain and inability to WB through R UE.  Ambulation/Gait Ambulation/Gait assistance: Min assist;Mod assist Gait Distance (Feet): 5 Feet Assistive device: R platform rolling walker (2 wheeled) Gait Pattern/deviations: Step-to pattern;Shuffle;Trunk flexed     General Gait Details: Pt was only able to go 5 ft with R platform RW due to pain and swelling in R UE and R LE. After walking pt c/o pain in his neck. Pt required min-mod assit to NWB on R UE and to stedy pt.   Stairs             Wheelchair Mobility    Modified Rankin (Stroke Patients Only)       Balance                                            Cognition Arousal/Alertness: Awake/alert Behavior During Therapy: WFL for tasks assessed/performed Overall Cognitive Status: Within Functional Limits for tasks assessed                                        Exercises General Exercises - Lower Extremity Ankle Circles/Pumps: Both;AROM;10 reps;Supine Quad Sets: Both;10 reps;AROM;Supine  General Comments        Pertinent Vitals/Pain Pain Assessment: 0-10 Pain Score: 7  Pain Location: R UE (hand and elbow) Feet were also really sore today, pt complained of neck pain at the end of the session Pain Descriptors / Indicators: Sore;Aching;Grimacing;Tightness;Shooting;Sharp Pain Intervention(s): Heat applied    Home Living                      Prior Function            PT Goals (current goals can now be found in the care plan section) Progress towards PT goals: Progressing toward goals    Frequency    Min 2X/week      PT Plan Current plan remains appropriate    Co-evaluation              AM-PAC PT "6 Clicks" Mobility   Outcome Measure  Help needed turning from your back to your side while in a flat bed without using bedrails?: A Lot Help needed moving from lying on your back to sitting on the side of a flat bed  without using bedrails?: A Lot Help needed moving to and from a bed to a chair (including a wheelchair)?: A Little Help needed standing up from a chair using your arms (e.g., wheelchair or bedside chair)?: A Lot Help needed to walk in hospital room?: A Little Help needed climbing 3-5 steps with a railing? : A Lot 6 Click Score: 14    End of Session Equipment Utilized During Treatment: Gait belt Activity Tolerance: Patient limited by pain Patient left: in bed;with call bell/phone within reach;with bed alarm set Nurse Communication: Mobility status;Patient requests pain meds PT Visit Diagnosis: Muscle weakness (generalized) (M62.81);Other abnormalities of gait and mobility (R26.89)     Time: [id number] PT Time Calculation (min) (ACUTE ONLY): 38 min  Charges:  $Gait Training: 8-22 mins $Therapeutic Activity: 23-37 mins                     Excell Seltzer, [place] [facility]

## 2019-05-21 NOTE — TOC Progression Note (Signed)
Transition of Care [facility]) - Progression Note    Patient Details  Name: Richard Calhoun MRN: [id number] Date of Birth: Feb 18, 1961  [facility]) CM/SW Contact  Joaquin Courts, RN Phone Number: 05/21/2019, 12:29 PM  Clinical Narrative:   CM spoke with patient's significant other leslie, who was very upset and vocal about the inability to find a rehab bed and the need for patient to return home. [name] states patient can return home but they do have stairs in the home which he cannot navigate and so patient will need to remain on the main floor which is needing repair. [name] reports she is meeting with a Marine scientist at [employer] and will know after that time the timeline for the work to be finished. [name] does state that there are four stairs to get into the town-home.  [facility]. [employer] notified of need for rolling walker and 3-in-1. Please call [employer] day of discharge so equipment can be delivered.        Expected Discharge Plan: [place] Barriers to Discharge: Unsafe home situation, Family Issues  Expected Discharge Plan and Services Expected Discharge Plan: [place] In-house Referral: Clinical Social Work Discharge Planning Services: CM Consult Post Acute Care Choice: [place] arrangements for the past 2 months: Single Family Home Expected Discharge Date: (unknown)               DME Arranged: 3-N-1, Walker rolling DME Agency: [employer] Date DME Agency Contacted: 05/21/19 Time DME Agency Contacted: 1227 Representative spoke with at DME Agency: [place]: PT [place]: [place] ([place]) Date [place]: 05/21/19 Time [place]: 1228 Representative spoke with at [place]: [place] ([place]) Interventions    Readmission Risk Interventions Readmission Risk Prevention Plan 04/04/2019   Transportation Screening Complete  PCP or Specialist Appt within 3-5 Days Not Complete  Not Complete comments Not ready for dc  HRI or [place] Not Complete  HRI or Home Care Consult comments NA  Social Work Consult for [place] Planning/Counseling Not Complete  SW consult not completed comments Pt confused  Palliative Care Screening Not Applicable  Medication Review Press photographer) Complete  Some recent data might be hidden
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
## 2019-05-21 NOTE — Progress Notes (Signed)
OT Cancellation Note  Patient Details Name: Richard Calhoun MRN: [id number] DOB: 1961/06/26   Cancelled Treatment:    Reason Eval/Treat Not Completed: Patient at procedure or test/ unavailable  Pt working with PT at this time, will f/u as able for OT tx.   Sharren Bridge  Pager [phone] 05/21/2019, 12:13 PM

## 2019-05-21 NOTE — Progress Notes (Signed)
Occupational Therapy Treatment Patient Details Name: Richard Calhoun MRN: [id number] DOB: [birth year] Today's Date: 05/21/2019    History of present illness 58 yo male admitted to ED on 9/10 for R flank pain, ETOH, N/V. Pt requiring intubation 9/13-9/18. Endoscopy reveals nonbleeding erosive gastropathy, duodenal erosions; placed feeding tube. Pt with new diagnosis LLL aspiration pneumonitis, hypoactive delirium. PMH includes anxiety, alcohol abuse, depression, GERD, HTN, HLD, tubular adenoma of colon, CAD with stent placement, PVD.   OT comments  Pt seen for OT tx this date to address safety with self care and ADL mobility. Two stand trials attempted with pt unable to come to full stand from EOB with MOD A With RW with platform for R UE (d/t pain/swelling/decreased ROM). Pt agreeable to rolling to replace linens, requires MOD A. OT facilitates education about optimal positioning of R UE to reduce edema with pt verbalizing understanding. SNF would be more beneficial and prudent d/c dispo.    Follow Up Recommendations  SNF;Home health OT;Supervision/Assistance - 24 hour(SNF vs HHOT depending on A at home and resolution of R UE pain/decreased ROM)    Equipment Recommendations  3 in 1 bedside commode    Recommendations for Other Services      Precautions / Restrictions Precautions Precautions: Fall Precaution Comments: s/p VDRF + extended LOS in ICU Restrictions Weight Bearing Restrictions: No       Mobility Bed Mobility Overal bed mobility: Needs Assistance Bed Mobility: Supine to Sit;Sit to Supine;Rolling Rolling: Mod assist(for removal of bed pan and soiled linens/replacement of clean linens, limited d/t R UE pain)   Supine to sit: Mod assist Sit to supine: Mod assist   General bed mobility comments: HOB elevated, MOD A d/t limited use of R UE 2/2 pain  Transfers Overall transfer level: Needs assistance   Transfers: Sit to/from Stand Sit to Stand: (static stand attempted x2  trials with MOD A, but pt unable to come to full stand citing R UE pain and dislike of use of platform walker.)              Balance Overall balance assessment: Needs assistance Sitting-balance support: Feet supported Sitting balance-Leahy Scale: Fair         Standing balance comment: deferred at this time                           ADL either performed or assessed with clinical judgement   ADL                       Lower Body Dressing: Maximal assistance;Bed level Lower Body Dressing Details (indicate cue type and reason): to doff socks after activity-pt's R UE edema prevented more fxl particiaption in this task.             Functional mobility during ADLs: (unable to complete fxl mobiltiy at time of tx, pt with difficulty coming to static stand.)       Vision Patient Visual Report: No change from baseline     Perception     Praxis      Cognition Arousal/Alertness: Awake/alert Behavior During Therapy: WFL for tasks assessed/performed Overall Cognitive Status: Within Functional Limits for tasks assessed                                          Exercises Other Exercises Other  Exercises: OT facilitates education re: importance of OOB activity to motivate pt to participate in skilled therapy as well as importance of elevation of R UE to reduce edema so that pt can self-advocate for optimized positioning.   Shoulder Instructions       General Comments      Pertinent Vitals/ Pain       Pain Assessment: 0-10 Pain Score: 7  Pain Location: R UE (hand and elbow) Feet also sore Pain Descriptors / Indicators: Sore;Aching;Grimacing;Tightness;Shooting;Sharp Pain Intervention(s): Limited activity within patient's tolerance;Monitored during session;Repositioned(elevated R UE)  Home Living                                          Prior Functioning/Environment              Frequency  Min 2X/week         Progress Toward Goals  OT Goals(current goals can now be found in the care plan section)  Progress towards OT goals: Progressing toward goals  Acute Rehab OT Goals Patient Stated Goal: to get stronger and get out of here  OT Goal Formulation: With patient Time For Goal Achievement: 05/31/19 Potential to Achieve Goals: Good  Plan Discharge plan remains appropriate    Co-evaluation                 AM-PAC OT "6 Clicks" Daily Activity     Outcome Measure   Help from another person eating meals?: None Help from another person taking care of personal grooming?: A Little Help from another person toileting, which includes using toliet, bedpan, or urinal?: A Little Help from another person bathing (including washing, rinsing, drying)?: A Lot Help from another person to put on and taking off regular upper body clothing?: A Little Help from another person to put on and taking off regular lower body clothing?: A Lot 6 Click Score: 17    End of Session Equipment Utilized During Treatment: Gait belt;Rolling walker  OT Visit Diagnosis: Unsteadiness on feet (R26.81);Muscle weakness (generalized) (M62.81) Pain - Right/Left: (R UE, L ankles and joints)   Activity Tolerance Patient limited by pain   Patient Left in bed;with call bell/phone within reach;with bed alarm set   Nurse Communication          Time: 1632-1700 OT Time Calculation (min): 28 min  Charges: OT General Charges $OT Visit: 1 Visit OT Treatments $Self Care/Home Management : 8-22 mins $Therapeutic Activity: 8-22 mins  Gerrianne Scale, MS, OTR/L Pager [phone] 05/21/19, 5:43 PM

## 2019-05-22 ENCOUNTER — Inpatient Hospital Stay (HOSPITAL_COMMUNITY): Payer: Self-pay

## 2019-05-22 LAB — CBC
HCT: 25.6 % — ABNORMAL LOW (ref 39.0–52.0)
Hemoglobin: 8.2 g/dL — ABNORMAL LOW (ref 13.0–17.0)
MCH: 34.3 pg — ABNORMAL HIGH (ref 26.0–34.0)
MCHC: 32 g/dL (ref 30.0–36.0)
MCV: 107.1 fL — ABNORMAL HIGH (ref 80.0–100.0)
Platelets: UNDETERMINED 10*3/uL (ref 150–400)
RBC: 2.39 MIL/uL — ABNORMAL LOW (ref 4.22–5.81)
RDW: 16.5 % — ABNORMAL HIGH (ref 11.5–15.5)
WBC: 9.3 10*3/uL (ref 4.0–10.5)
nRBC: 0 % (ref 0.0–0.2)

## 2019-05-22 LAB — BASIC METABOLIC PANEL
Anion gap: 8 (ref 5–15)
BUN: 14 mg/dL (ref 6–20)
CO2: 22 mmol/L (ref 22–32)
Calcium: 8.2 mg/dL — ABNORMAL LOW (ref 8.9–10.3)
Chloride: 101 mmol/L (ref 98–111)
Creatinine, Ser: 0.65 mg/dL (ref 0.61–1.24)
GFR calc Af Amer: >60 mL/min (ref >60)
GFR calc non Af Amer: >60 mL/min (ref >60)
Glucose, Bld: 110 mg/dL — ABNORMAL HIGH (ref 70–99)
Potassium: 3.6 mmol/L (ref 3.5–5.1)
Sodium: 131 mmol/L — ABNORMAL LOW (ref 135–145)

## 2019-05-22 LAB — MAGNESIUM: Magnesium: 1.5 mg/dL — ABNORMAL LOW (ref 1.7–2.4)

## 2019-05-22 MED ORDER — IOHEXOL 300 MG/ML  SOLN
100.0000 mL | Freq: Once | INTRAMUSCULAR | Status: AC | PRN
  Administered 2019-05-22: 100 mL via INTRAVENOUS
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
## 2019-05-23 DIAGNOSIS — M25531 Pain in right wrist: Secondary | ICD-10-CM

## 2019-05-23 LAB — BASIC METABOLIC PANEL
Anion gap: 9 (ref 5–15)
BUN: 14 mg/dL (ref 6–20)
CO2: 22 mmol/L (ref 22–32)
Calcium: 8.1 mg/dL — ABNORMAL LOW (ref 8.9–10.3)
Chloride: 102 mmol/L (ref 98–111)
Creatinine, Ser: 0.61 mg/dL (ref 0.61–1.24)
GFR calc Af Amer: >60 mL/min (ref >60)
GFR calc non Af Amer: >60 mL/min (ref >60)
Glucose, Bld: 136 mg/dL — ABNORMAL HIGH (ref 70–99)
Potassium: 3.4 mmol/L — ABNORMAL LOW (ref 3.5–5.1)
Sodium: 133 mmol/L — ABNORMAL LOW (ref 135–145)

## 2019-05-23 LAB — CBC
HCT: 24.9 % — ABNORMAL LOW (ref 39.0–52.0)
Hemoglobin: 7.8 g/dL — ABNORMAL LOW (ref 13.0–17.0)
MCH: 33.8 pg (ref 26.0–34.0)
MCHC: 31.3 g/dL (ref 30.0–36.0)
MCV: 107.8 fL — ABNORMAL HIGH (ref 80.0–100.0)
RBC: 2.31 MIL/uL — ABNORMAL LOW (ref 4.22–5.81)
RDW: 16.5 % — ABNORMAL HIGH (ref 11.5–15.5)
WBC: 8 10*3/uL (ref 4.0–10.5)
nRBC: 0 % (ref 0.0–0.2)

## 2019-05-23 LAB — MAGNESIUM: Magnesium: 1.5 mg/dL — ABNORMAL LOW (ref 1.7–2.4)

## 2019-05-23 MED ORDER — CEFAZOLIN SODIUM-DEXTROSE 1-4 GM/50ML-% IV SOLN
1.0000 g | Freq: Three times a day (TID) | INTRAVENOUS | Status: DC
  Administered 2019-05-23 – 2019-05-25 (×6): 1 g via INTRAVENOUS
  Filled 2019-05-23 (×8): qty 50

## 2019-05-23 NOTE — TOC Progression Note (Signed)
Transition of Care [facility]) - Progression Note    Patient Details  Name: Richard Calhoun MRN: [id number] Date of Birth: Oct 06, 1960  [facility]) CM/SW Contact  Leeroy Cha, RN Phone Number: 05/23/2019, 10:23 AM  Clinical Narrative:    tct-CHris at the [facility] to see if they acce't the log at this time.  WCB.   Expected Discharge Plan: [place] Barriers to Discharge: Unsafe home situation, Family Issues  Expected Discharge Plan and Services Expected Discharge Plan: [place] In-house Referral: Clinical Social Work Discharge Planning Services: CM Consult Post Acute Care Choice: [place] Living arrangements for the past 2 months: Single Family Home Expected Discharge Date: (unknown)               DME Arranged: 3-N-1, Walker rolling DME Agency: [employer] Date DME Agency Contacted: 05/21/19 Time DME Agency Contacted: 1227 Representative spoke with at DME Agency: [place]: PT [place]: [place] ([place]) Date [place]: 05/21/19 Time [place]: 1228 Representative spoke with at [place]: [place] ([place]) Interventions    Readmission Risk Interventions Readmission Risk Prevention Plan 05/21/2019 04/04/2019  Transportation Screening - Complete  PCP or Specialist Appt within 3-5 Days - Not Complete  Not Complete comments - Not ready for dc  HRI or [place] - Not Complete  HRI or Home Care Consult comments - NA  Social Work Consult for [place] Planning/Counseling - Not Complete  SW consult not completed comments - Pt confused  Palliative Care Screening - Not Applicable  Medication Review Press photographer) - Complete  HRI or Home Care Consult Complete -  SW Recovery Care/Counseling Consult Complete -  Palliative Care Screening Not Applicable -  [place] Not Complete -  SNF Comments no bed offers from  any facilities at this time -  Some recent data might be hidden

## 2019-05-23 NOTE — TOC Progression Note (Signed)
Transition of Care [facility]) - Progression Note    Patient Details  Name: Richard Calhoun MRN: [id number] Date of Birth: 1960-07-26  Transition of Care [facility]) CM/SW Contact  Leeroy Cha, RN Phone Number: 05/23/2019, 2:34 PM  Clinical Narrative:    tct-[place] the s.o. states that she in the middle of getting her floors done and should be finished next week.  Is not willing to stay in a hotel with patient due to floors, work and a cat. Hope program will require her to stay with patient and she is unable to do this.    Expected Discharge Plan: [place] Barriers to Discharge: Unsafe home situation, Family Issues  Expected Discharge Plan and Services Expected Discharge Plan: [place] In-house Referral: Clinical Social Work Discharge Planning Services: CM Consult Post Acute Care Choice: [place] Living arrangements for the past 2 months: Single Family Home Expected Discharge Date: (unknown)               DME Arranged: 3-N-1, Walker rolling DME Agency: [employer] Date DME Agency Contacted: 05/21/19 Time DME Agency Contacted: 1227 Representative spoke with at DME Agency: [place]: PT [place]: [place] ([place]) Date [place]: 05/21/19 Time [place]: 1228 Representative spoke with at [place]: [place] ([place]) Interventions    Readmission Risk Interventions Readmission Risk Prevention Plan 05/21/2019 04/04/2019  Transportation Screening - Complete  PCP or Specialist Appt within 3-5 Days - Not Complete  Not Complete comments - Not ready for dc  HRI or [place] - Not Complete  HRI or Home Care Consult comments - NA  Social Work Consult for [place] Planning/Counseling - Not Complete  SW consult not completed comments - Pt confused  Palliative Care Screening - Not Applicable  Medication Review Press photographer) - Complete  HRI or Home Care  Consult Complete -  SW Recovery Care/Counseling Consult Complete -  Palliative Care Screening Not Applicable -  [place] Not Complete -  SNF Comments no bed offers from any facilities at this time -  Some recent data might be hidden

## 2019-05-23 NOTE — Progress Notes (Signed)
Physical Therapy Treatment Patient Details Name: Richard Calhoun MRN: [id number] DOB: Feb 11, 1961 Today's Date: 05/23/2019    History of Present Illness 58 yo male admitted to ED on 9/10 for R flank pain, ETOH, N/V. Pt requiring intubation 9/13-9/18. Endoscopy reveals nonbleeding erosive gastropathy, duodenal erosions; placed feeding tube. Pt with new diagnosis LLL aspiration pneumonitis, hypoactive delirium. PMH includes anxiety, alcohol abuse, depression, GERD, HTN, HLD, tubular adenoma of colon, CAD with stent placement, PVD. Septic Arthritis R elbow.    PT Comments    Pt has a new Dx of Septic Arthritis which has impaired his mobility progress.  Present with increased c/o pain/swelling R UE with decreased functional use.  Also, increased c/o fatigue/weakness.  Assisted OOB to amb.  General bed mobility comments: HOB elevated and use of rail slightly more able to self perform and use R UE.  Also required increased time.General transfer comment: Pt reqired increased time and assist due to pain and inability to WB through R UE. General Gait Details: MAX c/o fatigue, feeling "tired".  Required + 2 assist for safety such that recliner was following.  Limited distance.  Soft BP  Pt needs aggressive PT and OT to increase his mobility, strength and endurance so he can return home.  Follow Up Recommendations  CIR     Equipment Recommendations  Rolling walker with 5" wheels;3in1 (PT)    Recommendations for Other Services       Precautions / Restrictions Precautions Precautions: Fall Precaution Comments: s/p VDRF + extended LOS in ICU    Mobility  Bed Mobility Overal bed mobility: Needs Assistance Bed Mobility: Supine to Sit     Supine to sit: Min assist     General bed mobility comments: HOB elevated and use of rail slightly more able to self perform and use R UE.  Also required increased time.  Transfers Overall transfer level: Needs assistance Equipment used: Rolling walker (2  wheeled) Transfers: Sit to/from Stand Sit to Stand: Mod assist;Min assist;Max assist(required increased assist from lower recliner level) Stand pivot transfers: Min assist;Mod assist       General transfer comment: Pt reqired increased time and assist due to pain and inability to WB through R UE.  Ambulation/Gait Ambulation/Gait assistance: Min assist;+2 safety/equipment Gait Distance (Feet): 48 Feet(one seated rest break)   Gait Pattern/deviations: Step-to pattern;Shuffle;Trunk flexed Gait velocity: increased   General Gait Details: MAX c/o fatigue, feeling "tired".  Required + 2 assist for safety such that recliner was following.  Limited distance.  Soft BP   Stairs             Wheelchair Mobility    Modified Rankin (Stroke Patients Only)       Balance                                            Cognition Arousal/Alertness: Awake/alert Behavior During Therapy: WFL for tasks assessed/performed Overall Cognitive Status: Within Functional Limits for tasks assessed                                 General Comments: pt getting frustrated with his current situation and extended hospital stay but continue to work with Physical Therapy to progress his mobility      Exercises      General Comments  Pertinent Vitals/Pain Pain Assessment: Faces Faces Pain Scale: Hurts even more Pain Location: R UE Elbow/wrist Pain Descriptors / Indicators: Sore;Aching;Grimacing;Tightness;Shooting;Sharp Pain Intervention(s): Monitored during session;Repositioned    Home Living                      Prior Function            PT Goals (current goals can now be found in the care plan section) Progress towards PT goals: Progressing toward goals    Frequency    Min 2X/week      PT Plan Current plan remains appropriate    Co-evaluation              AM-PAC PT "6 Clicks" Mobility   Outcome Measure  Help needed turning  from your back to your side while in a flat bed without using bedrails?: A Lot Help needed moving from lying on your back to sitting on the side of a flat bed without using bedrails?: A Lot Help needed moving to and from a bed to a chair (including a wheelchair)?: A Lot Help needed standing up from a chair using your arms (e.g., wheelchair or bedside chair)?: A Lot Help needed to walk in hospital room?: A Lot Help needed climbing 3-5 steps with a railing? : Total 6 Click Score: 11    End of Session Equipment Utilized During Treatment: Gait belt Activity Tolerance: Patient limited by fatigue Patient left: in chair;with call bell/phone within reach;with chair alarm set Nurse Communication: Mobility status;Patient requests pain meds PT Visit Diagnosis: Muscle weakness (generalized) (M62.81);Other abnormalities of gait and mobility (R26.89)     Time: 1210-1250 PT Time Calculation (min) (ACUTE ONLY): 40 min  Charges:  $Gait Training: 23-37 mins $Therapeutic Activity: 8-22 mins                     {Sani Madariaga  PTA Acute  Rehabilitation Services Pager      [phone] Office      [phone]

## 2019-05-23 NOTE — Progress Notes (Signed)
Inpatient Rehab Admissions:  Inpatient Rehab Consult received.  Pt previously assessed for CIR appropriateness by Raechel Ache (05/09/2019) and Dr. Posey Pronto and found to not have medical necessity to warrant a hospital based rehab program.   I reviewed patient's chart.  Note workup for L elbow pain negative (per Dr. Petra Kuba note from 11/4, L elbow joint effusion without any signs of septic arthritis) .  Pt does not have medical need to be followed daily by a physician for his rehab and can be managed at a lower level of care.  Will sign off at this time.    Shann Medal, PT, DPT Admissions Coordinator [phone] 05/23/19  2:17 PM

## 2019-05-23 NOTE — Progress Notes (Addendum)
PROGRESS NOTE    Richard Calhoun  [id number] DOB: [birth year] DOA: 03/30/2019 PCP: Charlott Rakes, MD   Brief Narrative:  58 year-old gentleman prior history of chronic alcohol abuse, chronic tobacco abuse, coronary artery disease status post angioplasty, CHF, dyslipidemia, peripheral vascular disease, paroxysmal tachycardia presented to the hospital on 03/30/2019 with complaints of hematemesis and abdominal pain he was admitted for evaluation of hematemesis secondary to upper GI bleed.  He was initially treated with IV octreotide, IV Protonix, IV Rocephin.  He was intubated due to worsening mentation on 04/01/2019 underwent EGD on 9/16 and was found to have esophageal varices with portal hypertensive gastropathy.  Patient was extubated on 04/06/2019 and remained in the ICU's for ICU delirium.  His hospital course was complicated by new onset atrial fibrillation with RVR.  Currently patient complains of right arm pain and swelling and x-rays did not show any osteomyelitis.  CT of the forearm shows cellulitis with associated myositis without any abscess formation.  He was also found to have left elbow joint effusion without signs of septic arthritis.  PT evaluation recommended SNF but difficult placement given his lack of insurance.  Patient is also very debilitated unable to climb stairs and is not safe for discharge home due to house repairs.  PT evaluation today recommending CIR and CIR consult placed.  Assessment & Plan:   Principal Problem:   Acute hyperactive alcohol withdrawal delirium (HCC) Active Problems:   Anxiety state   EtOH dependence ([place])   Hypertensive heart disease without heart failure   S/P drug eluting coronary stent placement   Upper GI bleed   Acute renal failure (HCC)   Alcoholic hepatitis without ascites   Acute hepatic encephalopathy   Encounter for central line placement   Acute hypernatremia   Metabolic acidosis   Thrombocytopenia (HCC)   Left arm swelling  Hypomagnesemia   Chronic diastolic CHF (congestive heart failure) (HCC)   Macrocytic anemia   Unspecified atrial fibrillation (HCC)   Iron deficiency anemia due to chronic blood loss   Pancreatic lesion   Hematemesis S/p EGD on 04/03/2019 was found to have esophageal varices with portal hypertensive gastropathy.  Biopsies negative for H. pylori.  Completed the course of Rocephin and 72 hours of octreotide.  Continue with PPI.    hematemesis resolved.   New onset atrial fibrillation Converted to sinus. Not a candidate for anticoagulation due to recurrent falls and a history of GI bleed with esophageal varices.   History of alcohol abuse leading to hepatic steatosis/alcohol hepatitis/early cirrhosis Thrombocytopenia probably secondary to to the above    Acute respiratory failure secondary to left lower lobe aspiration pneumonia Completed the course of antibiotics.  Patient is currently on room air with normal respiratory effort SLP evaluation done recommendations given.   Alcohol hepatitis/early cirrhosis. Continue with Lasix 20 mg daily and spironolactone 12.5 mg daily   Anemia of chronic disease/anemia of blood loss from GI bleed secondary to esophageal varices. S/p EGD Anemia panel reviewed Transfuse to keep hemoglobin greater than 7.  Currently no bleeding.    Type 2 diabetes mellitus Continue with sliding scale insulin.   Hypokalemia Hypomagnesemia Replaced.    Right upper extremity swelling/right upper extremity pain in the wrist and elbow Patient had a PICC line and it was removed on 05/18/2019.  Venous duplex of the right upper extremity negative for DVT or superficial thrombosis.  X-rays of the wrist do not show any fracture.  CT of the right wrist and elbow with contrast  shows cellulitis and associated myositis without any abscess formation.  Elbow effusion without any signs of septic arthritis. Continue supportive management with pain control, IV antibiotics  and elevate the arm.    Severe debility/deconditioning/prolonged hospital stay PT and OT eval recommending  CIR/SNF. Patient is adamantly refusing SNF and would like to go home but unfortunately is not safe for discharge home due to home repairs going on at this time.  Clinical social worker working on for safe disposition.  Meanwhile CIR consult placed    Pancreatic cystic lesion Will need outpatient MRI MRCP for further evaluation.   Chronic diastolic heart failure He appears compensated.  Last echocardiogram from 2019 showed left ventricular ejection fraction of 60 to 65% with grade 1 diastolic dysfunction.    DVT prophylaxis: SCDs Code Status: Full code Family Communication: None at bedside Disposition Plan: (Medically stable for discharge not safe for discharge home leading up to the floor and currently inhabitable and awaiting repairs.  Consultants:  Gastroenterology PCCM  Procedures: CT of the right arm EGD on 04/04/2019 Antimicrobials:ancef for cellulitis.   Subjective:wants to go home, and does not wnt to stay in the hospital.   Objective: Vitals:   05/22/19 1334 05/22/19 2100 05/23/19 0536 05/23/19 1240  BP: 103/60 120/75 110/60 106/64  Pulse: 83 94 85 85  Resp: 14 14 15    Temp: 98 F (36.7 C) 98.6 F (37 C) 98.1 F (36.7 C)   TempSrc: Oral Oral Oral   SpO2: 96% 97% 96% 99%  Weight:      Height:        Intake/Output Summary (Last 24 hours) at 05/23/2019 1314 Last data filed at 05/23/2019 0934 Gross per 24 hour  Intake 720 ml  Output 1275 ml  Net -555 ml   Filed Weights   04/14/19 1816 04/15/19 0414 04/16/19 0357  Weight: 85.6 kg 86.7 kg 85.6 kg    Examination:  General exam: Appears calm and comfortable  Respiratory system: Clear to auscultation. Respiratory effort normal. Cardiovascular system: S1 & S2 heard, RRR. No JVD, murmurs, Gastrointestinal system: Abdomen is nondistended, soft and nontender.  Normal bowel sounds heard. Central  nervous system: Alert and oriented. No focal neurological deficits. Extremities:  Right upper extremity swelling and tenderness.  Skin: No rashes, lesions or ulcers Psychiatry: Mood & affect appropriate.     Data Reviewed: I have personally reviewed following labs and imaging studies  CBC: Recent Labs  Lab 05/19/19 0334 05/22/19 0337 05/23/19 0309  WBC 8.5 9.3 8.0  HGB 8.0* 8.2* 7.8*  HCT 25.1* 25.6* 24.9*  MCV 105.0* 107.1* 107.8*  PLT PLATELET CLUMPS NOTED ON SMEAR, COUNT APPEARS ADEQUATE PLATELET CLUMPS NOTED ON SMEAR, UNABLE TO ESTIMATE PLATELET CLUMPING, SUGGEST RECOLLECTION OF SAMPLE IN CITRATE TUBE.   Basic Metabolic Panel: Recent Labs  Lab 05/19/19 0334 05/21/19 1524 05/22/19 0337 05/23/19 0309  NA 133*  --  131* 133*  K 3.5  --  3.6 3.4*  CL 104  --  101 102  CO2 21*  --  22 22  GLUCOSE 106*  --  110* 136*  BUN 10  --  14 14  CREATININE 0.52*  --  0.65 0.61  CALCIUM 8.2*  --  8.2* 8.1*  MG 1.2*  --  1.5* 1.5*  PHOS  --  3.9  --   --    GFR: Estimated Creatinine Clearance: 101.4 mL/min (by C-G formula based on SCr of 0.61 mg/dL). Liver Function Tests: No results for input(s): AST, ALT, ALKPHOS, BILITOT,  PROT, ALBUMIN in the last 168 hours. No results for input(s): LIPASE, AMYLASE in the last 168 hours. No results for input(s): AMMONIA in the last 168 hours. Coagulation Profile: No results for input(s): INR, PROTIME in the last 168 hours. Cardiac Enzymes: No results for input(s): CKTOTAL, CKMB, CKMBINDEX, TROPONINI in the last 168 hours. BNP (last 3 results) No results for input(s): PROBNP in the last 8760 hours. HbA1C: No results for input(s): HGBA1C in the last 72 hours. CBG: No results for input(s): GLUCAP in the last 168 hours. Lipid Profile: No results for input(s): CHOL, HDL, LDLCALC, TRIG, CHOLHDL, LDLDIRECT in the last 72 hours. Thyroid Function Tests: No results for input(s): TSH, T4TOTAL, FREET4, T3FREE, THYROIDAB in the last 72 hours.  Anemia Panel: No results for input(s): VITAMINB12, FOLATE, FERRITIN, TIBC, IRON, RETICCTPCT in the last 72 hours. Sepsis Labs: No results for input(s): PROCALCITON, LATICACIDVEN in the last 168 hours.  No results found for this or any previous visit (from the past 240 hour(s)).       Radiology Studies: Ct Forearm Right W Contrast  Result Date: 05/22/2019 CLINICAL DATA:  Wrist and forearm pain and swelling. EXAM: CT OF THE UPPER RIGHT EXTREMITY WITH CONTRAST TECHNIQUE: Multidetector CT imaging of the upper right extremity was performed according to the standard protocol following intravenous contrast administration. COMPARISON:  None. CONTRAST:  159mL OMNIPAQUE IOHEXOL 300 MG/ML  SOLN FINDINGS: Diffuse subcutaneous soft tissue swelling/edema/fluid suggesting cellulitis. No discrete drainable abscess is identified. No obvious changes of myositis or pyomyositis. A small elbow joint effusion is noted. Degenerative changes at the elbow but I do not see any destructive bony changes to suggest septic arthritis. Moderate degenerative changes at the wrist without definite findings for septic arthritis or osteomyelitis. The radius and ulna are intact. Large right pleural effusion and large volume ascites. IMPRESSION: 1. Cellulitis and possible myositis but no definite discrete drainable soft tissue abscess or pyomyositis. 2. Elbow joint effusion without obvious destructive bony changes to suggest septic arthritis. 3. No findings suspicious for osteomyelitis. 4. If symptoms persist or worsen MR is recommended as a much better test for further evaluation. 5. Large right pleural effusion and large volume abdominal ascites. Electronically Signed   By: Marijo Sanes M.D.   On: 05/22/2019 20:10        Scheduled Meds: . sodium chloride   Intravenous Once  . sodium chloride   Intravenous Once  . Chlorhexidine Gluconate Cloth  6 each Topical Daily  . docusate sodium  100 mg Oral BID  . furosemide  20 mg Oral  Daily  . gabapentin  200 mg Oral BID  . Gerhardt's butt cream   Topical TID  . hydrocortisone  25 mg Rectal QHS  . lactulose  10 g Oral Daily  . lansoprazole  15 mg Oral Q1200  . magnesium oxide  400 mg Oral BID  . mouth rinse  15 mL Mouth Rinse BID  . metoprolol tartrate  12.5 mg Oral BID  . miconazole nitrate   Topical BID  . multivitamin with minerals  1 tablet Oral Daily  . neomycin-bacitracin-polymyxin   Topical Daily  . sodium chloride flush  10-40 mL Intracatheter Q12H  . spironolactone  12.5 mg Oral Daily   Continuous Infusions: . sodium chloride 250 mL (04/19/19 1224)  .  ceFAZolin (ANCEF) IV       LOS: 54 days        Hosie Poisson, MD [facility] Pager [phone]  If 7PM-7AM, please contact night-coverage [email or web address]  Password [email or web address] 05/23/2019, 1:14 PM

## 2019-05-24 LAB — BASIC METABOLIC PANEL
Anion gap: 10 (ref 5–15)
BUN: 9 mg/dL (ref 6–20)
CO2: 21 mmol/L — ABNORMAL LOW (ref 22–32)
Calcium: 8 mg/dL — ABNORMAL LOW (ref 8.9–10.3)
Chloride: 101 mmol/L (ref 98–111)
Creatinine, Ser: 0.54 mg/dL — ABNORMAL LOW (ref 0.61–1.24)
GFR calc Af Amer: >60 mL/min (ref >60)
GFR calc non Af Amer: >60 mL/min (ref >60)
Glucose, Bld: 108 mg/dL — ABNORMAL HIGH (ref 70–99)
Potassium: 3.3 mmol/L — ABNORMAL LOW (ref 3.5–5.1)
Sodium: 132 mmol/L — ABNORMAL LOW (ref 135–145)

## 2019-05-24 MED ORDER — MAGNESIUM SULFATE 2 GM/50ML IV SOLN
2.0000 g | Freq: Once | INTRAVENOUS | Status: AC
  Administered 2019-05-24: 2 g via INTRAVENOUS
  Filled 2019-05-24: qty 50

## 2019-05-24 MED ORDER — POTASSIUM CHLORIDE CRYS ER 20 MEQ PO TBCR
40.0000 meq | EXTENDED_RELEASE_TABLET | Freq: Once | ORAL | Status: AC
  Administered 2019-05-24: 40 meq via ORAL
  Filled 2019-05-24: qty 2

## 2019-05-24 NOTE — Progress Notes (Signed)
Physical Therapy Treatment Patient Details Name: Richard Calhoun MRN: [id number] DOB: 09-05-1960 Today's Date: 05/24/2019    History of Present Illness 58 yo male admitted to ED on 9/10 for R flank pain, ETOH, N/V. Pt requiring intubation 9/13-9/18. Endoscopy reveals nonbleeding erosive gastropathy, duodenal erosions; placed feeding tube. Pt with new diagnosis LLL aspiration pneumonitis, hypoactive delirium. PMH includes anxiety, alcohol abuse, depression, GERD, HTN, HLD, tubular adenoma of colon, CAD with stent placement, PVD. R UE Myosistitis.    PT Comments    Pt progressing slolwy.  MAX c/ stating "I have no energy".  Multiple joint pain c/o's.  Now his L hip is hurting.  Stating 9/10 "deep", "sharpe".  Pt able to fully bear weight and amb.  Pt c/o R UE pain "burning".  Assisted OOB to stand and void.  Then assisted with  amb.  General Gait Details: MAX c/o fatigue, feeling "tired".  Required + 2 assist for safety such that recliner was following. Unable to attempt stairs due to MAX c/o weakness and fatigue.    Follow Up Recommendations  Home health PT     Equipment Recommendations  Rolling walker with 5" wheels;3in1 (PT)    Recommendations for Other Services       Precautions / Restrictions Precautions Precautions: Fall Precaution Comments: s/p VDRF + extended LOS Restrictions Weight Bearing Restrictions: No    Mobility  Bed Mobility Overal bed mobility: Needs Assistance       Supine to sit: Min assist     General bed mobility comments: HOB elevated and use of rail slightly more able to self perform and use R UE.  Also required increased time.  Transfers Overall transfer level: Needs assistance Equipment used: Rolling walker (2 wheeled) Transfers: Sit to/from Stand Sit to Stand: Mod assist;Min assist Stand pivot transfers: Min assist;Mod assist       General transfer comment: Pt reqired increased time and assist due to R UE and L hip pain.  Required increased  assist from lower recliner level vs elevated bed.  Ambulation/Gait Ambulation/Gait assistance: Min assist;+2 safety/equipment Gait Distance (Feet): 145 Feet(required 2 seated rest breaks)   Gait Pattern/deviations: Step-to pattern;Shuffle;Trunk flexed Gait velocity: increased   General Gait Details: MAX c/o fatigue, feeling "tired".  Required + 2 assist for safety such that recliner was following.   Stairs             Wheelchair Mobility    Modified Rankin (Stroke Patients Only)       Balance                                            Cognition Arousal/Alertness: Awake/alert Behavior During Therapy: WFL for tasks assessed/performed Overall Cognitive Status: Within Functional Limits for tasks assessed                                 General Comments: pt requires MAX encouragement to participate.      Exercises      General Comments        Pertinent Vitals/Pain Pain Assessment: 0-10 Pain Score: 9  Pain Location: new acute pain L hip "deep", "sharpe" Pain Descriptors / Indicators: Sharp Pain Intervention(s): Monitored during session    Home Living  Prior Function            PT Goals (current goals can now be found in the care plan section) Progress towards PT goals: Progressing toward goals    Frequency    Min 2X/week      PT Plan Current plan remains appropriate    Co-evaluation              AM-PAC PT "6 Clicks" Mobility   Outcome Measure  Help needed turning from your back to your side while in a flat bed without using bedrails?: A Lot Help needed moving from lying on your back to sitting on the side of a flat bed without using bedrails?: A Lot Help needed moving to and from a bed to a chair (including a wheelchair)?: A Lot Help needed standing up from a chair using your arms (e.g., wheelchair or bedside chair)?: A Lot Help needed to walk in hospital room?: A Lot Help  needed climbing 3-5 steps with a railing? : Total 6 Click Score: 11    End of Session Equipment Utilized During Treatment: Gait belt Activity Tolerance: Patient limited by fatigue Patient left: in chair;with call bell/phone within reach;with chair alarm set Nurse Communication: Mobility status;Patient requests pain meds PT Visit Diagnosis: Muscle weakness (generalized) (M62.81);Other abnormalities of gait and mobility (R26.89)     Time: 1515-1550 PT Time Calculation (min) (ACUTE ONLY): 35 min  Charges:  $Gait Training: 8-22 mins $Therapeutic Activity: 8-22 mins                     {Richard Calhoun  PTA Acute  Rehabilitation Services Pager      [phone] Office      [phone]

## 2019-05-24 NOTE — Progress Notes (Signed)
PROGRESS NOTE    Richard Calhoun  [id number] DOB: February 18, 1961 DOA: 03/30/2019 PCP: Charlott Rakes, MD   Brief Narrative:  58 year-old gentleman prior history of chronic alcohol abuse, chronic tobacco abuse, coronary artery disease status post angioplasty, CHF, dyslipidemia, peripheral vascular disease, paroxysmal tachycardia presented to the hospital on 03/30/2019 with complaints of hematemesis and abdominal pain he was admitted for evaluation of hematemesis secondary to upper GI bleed.  He was initially treated with IV octreotide, IV Protonix, IV Rocephin.  He was intubated due to worsening mentation on 04/01/2019 underwent EGD on 9/16 and was found to have esophageal varices with portal hypertensive gastropathy.  Patient was extubated on 04/06/2019 and remained in the ICU's for ICU delirium.  His hospital course was complicated by new onset atrial fibrillation with RVR.  Currently patient complains of right arm pain and swelling and x-rays did not show any osteomyelitis.  CT of the forearm shows cellulitis with associated myositis without any abscess formation.  He was also found to have left elbow joint effusion without signs of septic arthritis.  PT evaluation recommended SNF but difficult placement given his lack of insurance.  Patient is also very debilitated unable to climb stairs and is not safe for discharge home due to house repairs.  PT evaluation today recommending CIR. CIR suggested patient does not have a medical need to be followed daily by physician for rehab and hence can be managed at a lower level of care. When patient does not want to be discharged anywhere but home at this time.  Assessment & Plan:   Principal Problem:   Acute hyperactive alcohol withdrawal delirium (HCC) Active Problems:   Anxiety state   EtOH dependence ([place])   Hypertensive heart disease without heart failure   S/P drug eluting coronary stent placement   Upper GI bleed   Acute renal failure (HCC)  Alcoholic hepatitis without ascites   Acute hepatic encephalopathy   Encounter for central line placement   Acute hypernatremia   Metabolic acidosis   Thrombocytopenia (HCC)   Left arm swelling   Hypomagnesemia   Chronic diastolic CHF (congestive heart failure) (HCC)   Macrocytic anemia   Unspecified atrial fibrillation (HCC)   Iron deficiency anemia due to chronic blood loss   Pancreatic lesion   Hematemesis S/p EGD on 04/03/2019 was found to have esophageal varices with portal hypertensive gastropathy.  Biopsies negative for H. pylori.  Completed the course of Rocephin and 72 hours of octreotide.  Continue with PPI.   Patient denies any hematemesis or black stools at this time.   New onset atrial fibrillation Currently in sinus rhythm at this time. Not a candidate for anticoagulation due to recurrent falls and history of GI bleed and esophageal varices.    History of alcohol abuse leading to hepatic steatosis/alcohol hepatitis/early cirrhosis Thrombocytopenia probably secondary to to the above Platelet counts are clumped this time.    Acute respiratory failure secondary to left lower lobe aspiration pneumonia Completed the course of antibiotics.  Patient is currently on room air with normal respiratory effort SLP evaluation done and recommendations given continue with regular diet and thin liquids..   Alcohol hepatitis/early cirrhosis. No signs of decompensation. Continue with Lasix 20 mg daily and spironolactone 12.5 mg daily   Anemia of chronic disease/anemia of blood loss from GI bleed secondary to esophageal varices. S/p EGD Anemia panel reviewed Transfuse to keep hemoglobin greater than 7.  Currently no bleeding.  Hemoglobin at 7.8.  Repeat CBC in the morning.  Type 2 diabetes mellitus Hemoglobin A1c around 5.3 this admission. Diet-controlled    Hypokalemia Hypomagnesemia Replaced.  Repeat in the morning.    Right upper extremity swelling/right upper  extremity pain in the wrist and elbow Patient had a PICC line and it was removed on 05/18/2019.  Venous duplex of the right upper extremity negative for DVT or superficial thrombosis.  X-rays of the wrist do not show any fracture.  CT of the right wrist and elbow with contrast shows cellulitis and associated myositis without any abscess formation.  Elbow effusion without any signs of septic arthritis. Continue supportive management with pain control, IV antibiotics and elevate the arm.  Continue with IV antibiotics at this time patient reports his right forearm and hand swelling and pain is improving.    Severe debility/deconditioning/prolonged hospital stay PT and OT eval recommending  CIR/SNF. Patient is adamantly refusing SNF and would like to go home but unfortunately is not safe for discharge home due to home repairs going on at this time.  Clinical social Development worker, community working on for safe disposition.      Pancreatic cystic lesion Will need outpatient MRI MRCP for further evaluation. Discussed pancreatic lesion with the patient.   Chronic diastolic heart failure He appears compensated and he is on Lasix at this time.  Last echocardiogram from 2019 showed left ventricular ejection fraction of 60 to 65% with grade 1 diastolic dysfunction.  Hyponatremia probably secondary to mild fluid overload/early cirrhosis//Lasix use. Patient asymptomatic at this time.  Continue to monitor  DVT prophylaxis: SCDs Code Status: Full code Family Communication: None at bedside Disposition Plan: (Medically stable for discharge, not safe for discharge home due to inhabitable and awaiting repairs.  Consultants:  Gastroenterology PCCM  Procedures: CT of the right arm EGD on 04/04/2019 Antimicrobials:ancef for cellulitis.   Subjective: Patient appears frustrated about staying in the hospital.  He denies any new complaints at this time.  Objective: Vitals:   05/23/19 2123 05/24/19 0559  05/24/19 0932 05/24/19 1350  BP: 121/64 118/71 115/68 115/66  Pulse: (!) 101 87 90 87  Resp: 15 16 18 18   Temp: 98.9 F (37.2 C) 98.3 F (36.8 C) 98.7 F (37.1 C) 98.9 F (37.2 C)  TempSrc: Oral Oral Oral Oral  SpO2: 99% 96% 95% 98%  Weight:      Height:        Intake/Output Summary (Last 24 hours) at 05/24/2019 1358 Last data filed at 05/24/2019 1000 Gross per 24 hour  Intake 519.51 ml  Output 750 ml  Net -230.49 ml   Filed Weights   04/14/19 1816 04/15/19 0414 04/16/19 0357  Weight: 85.6 kg 86.7 kg 85.6 kg    Examination:  General exam: Well-developed, not in any kind of distress Respiratory system: Clear to auscultation bilaterally no wheezing or rhonchi Cardiovascular system: S1-S2 heard, regular rate rhythm Gastrointestinal system: Abdomen is soft, nontender, nondistended, normal bowel sounds Central nervous system: Alert and oriented, grossly nonfocal Extremities: Right upper extremity swelling and tenderness improving Skin: No rashes Psychiatry: Restless    Data Reviewed: I have personally reviewed following labs and imaging studies  CBC: Recent Labs  Lab 05/19/19 0334 05/22/19 0337 05/23/19 0309  WBC 8.5 9.3 8.0  HGB 8.0* 8.2* 7.8*  HCT 25.1* 25.6* 24.9*  MCV 105.0* 107.1* 107.8*  PLT PLATELET CLUMPS NOTED ON SMEAR, COUNT APPEARS ADEQUATE PLATELET CLUMPS NOTED ON SMEAR, UNABLE TO ESTIMATE PLATELET CLUMPING, SUGGEST RECOLLECTION OF SAMPLE IN CITRATE TUBE.   Basic Metabolic Panel: Recent Labs  Lab 05/19/19 0334 05/21/19 1524 05/22/19 0337 05/23/19 0309 05/24/19 0839  NA 133*  --  131* 133* 132*  K 3.5  --  3.6 3.4* 3.3*  CL 104  --  101 102 101  CO2 21*  --  22 22 21*  GLUCOSE 106*  --  110* 136* 108*  BUN 10  --  14 14 9   CREATININE 0.52*  --  0.65 0.61 0.54*  CALCIUM 8.2*  --  8.2* 8.1* 8.0*  MG 1.2*  --  1.5* 1.5*  --   PHOS  --  3.9  --   --   --    GFR: Estimated Creatinine Clearance: 101.4 mL/min (A) (by C-G formula based on SCr of  0.54 mg/dL (L)). Liver Function Tests: No results for input(s): AST, ALT, ALKPHOS, BILITOT, PROT, ALBUMIN in the last 168 hours. No results for input(s): LIPASE, AMYLASE in the last 168 hours. No results for input(s): AMMONIA in the last 168 hours. Coagulation Profile: No results for input(s): INR, PROTIME in the last 168 hours. Cardiac Enzymes: No results for input(s): CKTOTAL, CKMB, CKMBINDEX, TROPONINI in the last 168 hours. BNP (last 3 results) No results for input(s): PROBNP in the last 8760 hours. HbA1C: No results for input(s): HGBA1C in the last 72 hours. CBG: No results for input(s): GLUCAP in the last 168 hours. Lipid Profile: No results for input(s): CHOL, HDL, LDLCALC, TRIG, CHOLHDL, LDLDIRECT in the last 72 hours. Thyroid Function Tests: No results for input(s): TSH, T4TOTAL, FREET4, T3FREE, THYROIDAB in the last 72 hours. Anemia Panel: No results for input(s): VITAMINB12, FOLATE, FERRITIN, TIBC, IRON, RETICCTPCT in the last 72 hours. Sepsis Labs: No results for input(s): PROCALCITON, LATICACIDVEN in the last 168 hours.  No results found for this or any previous visit (from the past 240 hour(s)).       Radiology Studies: Ct Forearm Right W Contrast  Result Date: 05/22/2019 CLINICAL DATA:  Wrist and forearm pain and swelling. EXAM: CT OF THE UPPER RIGHT EXTREMITY WITH CONTRAST TECHNIQUE: Multidetector CT imaging of the upper right extremity was performed according to the standard protocol following intravenous contrast administration. COMPARISON:  None. CONTRAST:  151mL OMNIPAQUE IOHEXOL 300 MG/ML  SOLN FINDINGS: Diffuse subcutaneous soft tissue swelling/edema/fluid suggesting cellulitis. No discrete drainable abscess is identified. No obvious changes of myositis or pyomyositis. A small elbow joint effusion is noted. Degenerative changes at the elbow but I do not see any destructive bony changes to suggest septic arthritis. Moderate degenerative changes at the wrist  without definite findings for septic arthritis or osteomyelitis. The radius and ulna are intact. Large right pleural effusion and large volume ascites. IMPRESSION: 1. Cellulitis and possible myositis but no definite discrete drainable soft tissue abscess or pyomyositis. 2. Elbow joint effusion without obvious destructive bony changes to suggest septic arthritis. 3. No findings suspicious for osteomyelitis. 4. If symptoms persist or worsen MR is recommended as a much better test for further evaluation. 5. Large right pleural effusion and large volume abdominal ascites. Electronically Signed   By: Marijo Sanes M.D.   On: 05/22/2019 20:10        Scheduled Meds:  sodium chloride   Intravenous Once   sodium chloride   Intravenous Once   Chlorhexidine Gluconate Cloth  6 each Topical Daily   docusate sodium  100 mg Oral BID   furosemide  20 mg Oral Daily   gabapentin  200 mg Oral BID   Gerhardt's butt cream   Topical TID   hydrocortisone  25 mg Rectal QHS   lactulose  10 g Oral Daily   lansoprazole  15 mg Oral Q1200   magnesium oxide  400 mg Oral BID   mouth rinse  15 mL Mouth Rinse BID   metoprolol tartrate  12.5 mg Oral BID   miconazole nitrate   Topical BID   multivitamin with minerals  1 tablet Oral Daily   neomycin-bacitracin-polymyxin   Topical Daily   sodium chloride flush  10-40 mL Intracatheter Q12H   spironolactone  12.5 mg Oral Daily   Continuous Infusions:  sodium chloride 250 mL (04/19/19 1224)    ceFAZolin (ANCEF) IV 1 g (05/24/19 0556)     LOS: 72 days        Hosie Poisson, MD [facility] Pager [phone]  If 7PM-7AM, please contact night-coverage [email or web address] Password [email or web address] 05/24/2019, 1:58 PM

## 2019-05-24 NOTE — Plan of Care (Signed)

## 2019-05-25 LAB — CBC WITH DIFFERENTIAL/PLATELET
Abs Immature Granulocytes: 0.02 10*3/uL (ref 0.00–0.07)
Basophils Absolute: 0 10*3/uL (ref 0.0–0.1)
Basophils Relative: 1 %
Eosinophils Absolute: 0.1 10*3/uL (ref 0.0–0.5)
Eosinophils Relative: 2 %
HCT: 25.5 % — ABNORMAL LOW (ref 39.0–52.0)
Hemoglobin: 8 g/dL — ABNORMAL LOW (ref 13.0–17.0)
Immature Granulocytes: 0 %
Lymphocytes Relative: 11 %
Lymphs Abs: 0.9 10*3/uL (ref 0.7–4.0)
MCH: 33.9 pg (ref 26.0–34.0)
MCHC: 31.4 g/dL (ref 30.0–36.0)
MCV: 108.1 fL — ABNORMAL HIGH (ref 80.0–100.0)
Monocytes Absolute: 0.6 10*3/uL (ref 0.1–1.0)
Monocytes Relative: 8 %
Neutro Abs: 6.5 10*3/uL (ref 1.7–7.7)
Neutrophils Relative %: 78 %
Platelets: UNDETERMINED 10*3/uL (ref 150–400)
RBC: 2.36 MIL/uL — ABNORMAL LOW (ref 4.22–5.81)
RDW: 16.6 % — ABNORMAL HIGH (ref 11.5–15.5)
WBC: 8.2 10*3/uL (ref 4.0–10.5)
nRBC: 0 % (ref 0.0–0.2)

## 2019-05-25 LAB — BASIC METABOLIC PANEL
Anion gap: 7 (ref 5–15)
BUN: 11 mg/dL (ref 6–20)
CO2: 23 mmol/L (ref 22–32)
Calcium: 8.2 mg/dL — ABNORMAL LOW (ref 8.9–10.3)
Chloride: 102 mmol/L (ref 98–111)
Creatinine, Ser: 0.61 mg/dL (ref 0.61–1.24)
GFR calc Af Amer: >60 mL/min (ref >60)
GFR calc non Af Amer: >60 mL/min (ref >60)
Glucose, Bld: 110 mg/dL — ABNORMAL HIGH (ref 70–99)
Potassium: 3.8 mmol/L (ref 3.5–5.1)
Sodium: 132 mmol/L — ABNORMAL LOW (ref 135–145)

## 2019-05-25 MED ORDER — FUROSEMIDE 20 MG PO TABS: 20.0000 mg | ORAL_TABLET | Freq: Every day | ORAL | 1 refills | Status: AC

## 2019-05-25 MED ORDER — LACTULOSE 10 GM/15ML PO SOLN: 10.0000 g | Freq: Every day | ORAL | 0 refills | Status: AC

## 2019-05-25 MED ORDER — DOCUSATE SODIUM 100 MG PO CAPS: 100.0000 mg | ORAL_CAPSULE | Freq: Two times a day (BID) | ORAL | 0 refills | Status: AC

## 2019-05-25 MED ORDER — SPIRONOLACTONE 25 MG PO TABS: 12.5000 mg | ORAL_TABLET | Freq: Every day | ORAL | 1 refills | Status: AC

## 2019-05-25 MED ORDER — METOPROLOL TARTRATE 25 MG PO TABS: 12.5000 mg | ORAL_TABLET | Freq: Two times a day (BID) | ORAL | 1 refills | Status: AC

## 2019-05-25 MED ORDER — GABAPENTIN 100 MG PO CAPS: 200.0000 mg | ORAL_CAPSULE | Freq: Two times a day (BID) | ORAL | 0 refills | Status: AC

## 2019-05-25 MED ORDER — HYDROCORTISONE ACETATE 25 MG RE SUPP: 25.0000 mg | Freq: Every day | RECTAL | 0 refills | Status: AC

## 2019-05-25 MED ORDER — CEPHALEXIN 500 MG PO CAPS: 500.0000 mg | ORAL_CAPSULE | Freq: Two times a day (BID) | ORAL | 0 refills | Status: AC

## 2019-05-25 MED FILL — FUROSEMIDE 20 MG TABS: 20 | 30 days supply | Qty: 30 | Fill #0

## 2019-05-25 MED FILL — ?CEPHALEXIN 500MG CAPSULE: 500 | 7 days supply | Qty: 14 | Fill #0

## 2019-05-25 MED FILL — GABAPENTIN 100 MG CAP: 100 | 30 days supply | Qty: 60 | Fill #0

## 2019-05-25 MED FILL — LACTULOSE 10 GM/15 ML SOLN: 10 | 16 days supply | Qty: 236 | Fill #0

## 2019-05-25 MED FILL — METOPROLOL TARTRATE 25 MG T: 25 | 30 days supply | Qty: 60 | Fill #0

## 2019-05-25 MED FILL — HYDROCORTISONE AC 25 MG SUP: 25 | 12 days supply | Qty: 12 | Fill #0

## 2019-05-25 MED FILL — SPIRONOLACTONE 25 MG TABLET: 25 | 30 days supply | Qty: 15 | Fill #0

## 2019-05-25 NOTE — Progress Notes (Signed)
Physical Therapy Treatment Patient Details Name: Richard Calhoun MRN: [id number] DOB: May 31, 1961 Today's Date: 05/25/2019    History of Present Illness 58 yo male admitted to ED on 9/10 for R flank pain, ETOH, N/V. Pt requiring intubation 9/13-9/18. Endoscopy reveals nonbleeding erosive gastropathy, duodenal erosions; placed feeding tube. Pt with new diagnosis LLL aspiration pneumonitis, hypoactive delirium. PMH includes anxiety, alcohol abuse, depression, GERD, HTN, HLD, tubular adenoma of colon, CAD with stent placement, PVD. R UE Myosistitis.    PT Comments    Pt stasted he is "being kicked out today" to a Hotel after [name] gets out of work.  Assisted OOB to amb the full unit.  General Gait Details: tolerated an increased distance but required x 4 standing rest breaks and 25% VC's on proper walker to self distance.   Follow Up Recommendations  Home health PT     Equipment Recommendations  Rolling walker with 5" wheels;3in1 (PT)    Recommendations for Other Services       Precautions / Restrictions Precautions Precaution Comments: s/p VDRF + extended LOS Restrictions Weight Bearing Restrictions: No    Mobility  Bed Mobility Overal bed mobility: Needs Assistance Bed Mobility: Supine to Sit     Supine to sit: Supervision;Min guard     General bed mobility comments: HOB elevated and use of rail slightly more able to self perform and use R UE.  Also required increased time.  Transfers Overall transfer level: Needs assistance Equipment used: Rolling walker (2 wheeled) Transfers: Sit to/from Stand Sit to Stand: Supervision;Min guard Stand pivot transfers: Supervision;Min guard       General transfer comment: 25% VC's on safety with turn completion and increased time  Ambulation/Gait Ambulation/Gait assistance: Supervision;Min guard Gait Distance (Feet): 450 Feet Assistive device: Rolling walker (2 wheeled) Gait Pattern/deviations: Step-to pattern;Shuffle;Trunk  flexed Gait velocity: WFL   General Gait Details: tolerated an increased distance but required x 4 standing rest breaks and 25% VC's on proper walker to self distance.   Stairs             Wheelchair Mobility    Modified Rankin (Stroke Patients Only)       Balance                                            Cognition Arousal/Alertness: Awake/alert Behavior During Therapy: WFL for tasks assessed/performed Overall Cognitive Status: Within Functional Limits for tasks assessed                                        Exercises      General Comments        Pertinent Vitals/Pain Pain Assessment: Faces Faces Pain Scale: Hurts a little bit Pain Location: L hip and R arm Pain Descriptors / Indicators: Aching;Grimacing Pain Intervention(s): Monitored during session;Repositioned    Home Living                      Prior Function            PT Goals (current goals can now be found in the care plan section) Progress towards PT goals: Progressing toward goals    Frequency    Min 2X/week      PT Plan Current plan remains appropriate  Co-evaluation              AM-PAC PT "6 Clicks" Mobility   Outcome Measure  Help needed turning from your back to your side while in a flat bed without using bedrails?: A Little Help needed moving from lying on your back to sitting on the side of a flat bed without using bedrails?: A Little Help needed moving to and from a bed to a chair (including a wheelchair)?: A Little Help needed standing up from a chair using your arms (e.g., wheelchair or bedside chair)?: A Little Help needed to walk in hospital room?: A Little Help needed climbing 3-5 steps with a railing? : Total 6 Click Score: 16    End of Session Equipment Utilized During Treatment: Gait belt Activity Tolerance: Patient tolerated treatment well Patient left: in bed;with call bell/phone within reach;with bed alarm  set Nurse Communication: Mobility status;Patient requests pain meds PT Visit Diagnosis: Muscle weakness (generalized) (M62.81);Other abnormalities of gait and mobility (R26.89)     Time: 1455-1520 PT Time Calculation (min) (ACUTE ONLY): 25 min  Charges:  $Gait Training: 8-22 mins $Therapeutic Exercise: 8-22 mins                     Rica Koyanagi  PTA Acute  Rehabilitation Services Pager      [phone] Office      [phone]

## 2019-05-25 NOTE — TOC Progression Note (Addendum)
Transition of Care [facility]) - Progression Note    Patient Details  Name: Richard Calhoun MRN: [id number] Date of Birth: 05/03/1961  [facility]) CM/SW Contact  Leeroy Cha, RN Phone Number: 05/25/2019, 11:38 AM  Clinical Narrative:    Per supervisor Nathaniel Man patient can go to homeless shelter due to being ready for dc without place to go/  tcf-girlfriend [name]-became outraged and said she would pick him up tonight around 530pm or 600 after she gets off work.  Dr. Karleen [place] notified. 1200 spoke with patient and informed of choices for dc.  Will go home with girl friend [name].  Expected Discharge Plan: [place] Barriers to Discharge: Unsafe home situation, Family Issues  Expected Discharge Plan and Services Expected Discharge Plan: [place] In-house Referral: Clinical Social Work Discharge Planning Services: CM Consult Post Acute Care Choice: [place] Living arrangements for the past 2 months: Single Family Home Expected Discharge Date: (unknown)               DME Arranged: 3-N-1, Walker rolling DME Agency: [employer] Date DME Agency Contacted: 05/21/19 Time DME Agency Contacted: 1227 Representative spoke with at DME Agency: [place]: PT [place]: [place] ([place]) Date [place]: 05/21/19 Time [place]: 1228 Representative spoke with at [place]: [place] ([place]) Interventions    Readmission Risk Interventions Readmission Risk Prevention Plan 05/21/2019 04/04/2019  Transportation Screening - Complete  PCP or Specialist Appt within 3-5 Days - Not Complete  Not Complete comments - Not ready for dc  HRI or [place] - Not Complete  HRI or Home Care Consult comments - NA  Social Work Consult for [place] Planning/Counseling - Not Complete  SW consult not completed comments - Pt confused  Palliative Care Screening - Not  Applicable  Medication Review Press photographer) - Complete  HRI or Home Care Consult Complete -  SW Recovery Care/Counseling Consult Complete -  Palliative Care Screening Not Applicable -  [name] Not Complete -  SNF Comments no bed offers from any facilities at this time -  Some recent data might be hidden

## 2019-05-25 NOTE — Progress Notes (Addendum)
Patient dc home with RW. Will follow up with [place] on 3n1 bedside commode. Secure email message sent to rep, Zach. [place], [place] ED TOC CM [phone]

## 2019-05-25 NOTE — Progress Notes (Signed)
Discharge instructions discussed with patient and significant other, [name], verbalized agreement and understanding.

## 2019-05-25 NOTE — Discharge Summary (Signed)
Physician Discharge Summary  Richard Calhoun [id number] DOB: April 20, 1961 DOA: 03/30/2019  PCP: Charlott Rakes, MD  Admit date: 03/30/2019 Discharge date: 05/25/2019  Admitted From: Home.  Disposition:  Home.   Recommendations for Outpatient Follow-up:  1. Follow up with PCP in 1-2 weeks 2. Please obtain BMP/CBC in one week 3.         Please follow up with gastroenterology as recommended.  4.         You will need follow up MRI of the pancreas in 2  weeks for evaluation of the the cystic lesion in the tail of the pancreas, discussed with the patient.   Discharge Condition: Guarded.  CODE STATUS: full code.  Diet recommendation: Heart Healthy  Brief/Interim Summary: 58 year-old gentleman prior history of chronic alcohol abuse, chronic tobacco abuse, coronary artery disease status post angioplasty, CHF, dyslipidemia, peripheral vascular disease, paroxysmal tachycardia presented to the hospital on 03/30/2019 with complaints of hematemesis and abdominal pain he was admitted for evaluation of hematemesis secondary to upper GI bleed.  He was initially treated with IV octreotide, IV Protonix, IV Rocephin.  He was intubated due to worsening mentation on 04/01/2019 underwent EGD on 9/16 and was found to have esophageal varices with portal hypertensive gastropathy.  Patient was extubated on 04/06/2019 and remained in the ICU's for ICU delirium.  His hospital course was complicated by new onset atrial fibrillation with RVR.  Currently patient complains of right arm pain and swelling and x-rays did not show any osteomyelitis.  CT of the forearm shows cellulitis with associated myositis without any abscess formation.  He was also found to have left elbow joint effusion without signs of septic arthritis.  PT evaluation recommended SNF but difficult placement given his lack of insurance.  Patient is also very debilitated unable to climb stairs and is not safe for discharge home due to house repairs.  PT evaluation  today recommending CIR. CIR suggested patient does not have a medical need to be followed daily by physician for rehab and hence can be managed at a lower level of care. Patient is adamant about going home and not to any rehab facility at this time. As per conversations with the patient and CM, his home is ready and habitable on the second floor while the construction is going on the first floor.     Discharge Diagnoses:  Principal Problem:   Acute hyperactive alcohol withdrawal delirium (HCC) Active Problems:   Anxiety state   EtOH dependence ([place])   Hypertensive heart disease without heart failure   S/P drug eluting coronary stent placement   Upper GI bleed   Acute renal failure (HCC)   Alcoholic hepatitis without ascites   Acute hepatic encephalopathy   Encounter for central line placement   Acute hypernatremia   Metabolic acidosis   Thrombocytopenia (HCC)   Left arm swelling   Hypomagnesemia   Chronic diastolic CHF (congestive heart failure) (HCC)   Macrocytic anemia   Unspecified atrial fibrillation (HCC)   Iron deficiency anemia due to chronic blood loss   Pancreatic lesion  Hematemesis S/p EGD on 04/03/2019 was found to have esophageal varices with portal hypertensive gastropathy.  Biopsies negative for H. pylori.  Completed the course of Rocephin and 72 hours of octreotide.  Continue with PPI.  Patient denies any hematemesis or black stools at this time.   New onset atrial fibrillation Currently in sinus rhythm at this time. Not a candidate for anticoagulation due to recurrent falls and history of GI  bleed and esophageal varices.    History of alcohol abuse leading to hepatic steatosis/alcohol hepatitis/early cirrhosis Thrombocytopenia probably secondary to to the above Platelets are clumped this time. No signs of bleeding.     Acute respiratory failure secondary to left lower lobe aspiration pneumonia Completed the course of antibiotics.  Patient is currently  on room air with normal respiratory effort SLP evaluation done and recommendations given continue with regular diet and thin liquids..   Alcohol hepatitis/early cirrhosis. No signs of decompensation. Continue with Lasix 20 mg daily and spironolactone 12.5 mg daily   Anemia of chronic disease/anemia of blood loss from GI bleed secondary to esophageal varices. S/p EGD Anemia panel reviewed Transfuse to keep hemoglobin greater than 7.  Currently no bleeding.  Hemoglobin at 8     Type 2 diabetes mellitus Hemoglobin A1c around 5.3 this admission. Diet-controlled    Hypokalemia Hypomagnesemia Replaced.      Right upper extremity swelling/right upper extremity pain in the wrist and elbow Patient had a PICC line and it was removed on 05/18/2019.  Venous duplex of the right upper extremity negative for DVT or superficial thrombosis.  X-rays of the wrist do not show any fracture.  CT of the right wrist and elbow with contrast shows cellulitis and associated myositis without any abscess formation.  Elbow effusion without any signs of septic arthritis. Continue supportive management with pain control, and elevate the arm. He was started on IV ancef and later on transitioned of oral keflex on discharge.      Severe debility/deconditioning/prolonged hospital stay PT and OT eval recommending  CIR/SNF. Patient is adamantly refusing SNF and would like to go home as it is habitable now. He will stay on the 2 nd floor of the house. Wife is coming to pick him later today.   Pancreatic cystic lesion Will need outpatient repeat  MRI MRCP for further evaluation in 2 weeks. Last MRI was 4 weeks ago.  Discussed pancreatic cystic lesion, and the need for follow up with GI or PCP.    Chronic diastolic heart failure He appears compensated and he is on Lasix at this time.  Last echocardiogram from 2019 showed left ventricular ejection fraction of 60 to 65% with grade 1 diastolic  dysfunction.  Hyponatremia probably secondary to mild fluid overload/early cirrhosis//Lasix use. Patient asymptomatic at this time.     Discharge Instructions   Allergies as of 05/25/2019      Reactions   Penicillins Other (See Comments)   Childhood reaction Has patient had a PCN reaction causing immediate rash, facial/tongue/throat swelling, SOB or lightheadedness with hypotension: Unknown Has patient had a PCN reaction causing severe rash involving mucus membranes or skin necrosis: Unknown Has patient had a PCN reaction that required hospitalization: No Has patient had a PCN reaction occurring within the last 10 years: No If all of the above answers are "NO", then may proceed with Cephalosporin use.      Medication List    STOP taking these medications   clotrimazole-betamethasone cream Commonly known as: LOTRISONE   doxycycline 100 MG tablet Commonly known as: VIBRA-TABS   losartan 100 MG tablet Commonly known as: COZAAR     TAKE these medications   atorvastatin 80 MG tablet Commonly known as: LIPITOR Take 1 tablet (80 mg total) by mouth daily at 6 PM.   Centrum tablet Take 1 tablet by mouth daily.   cephALEXin 500 MG capsule Commonly known as: KEFLEX Take 1 capsule (500 mg total) by mouth 2 (  two) times daily for 7 days.   docusate sodium 100 MG capsule Commonly known as: COLACE Take 1 capsule (100 mg total) by mouth 2 (two) times daily.   furosemide 20 MG tablet Commonly known as: LASIX Take 1 tablet (20 mg total) by mouth daily. Start taking on: May 26, 2019 What changed:   medication strength  how much to take   gabapentin 100 MG capsule Commonly known as: NEURONTIN Take 2 capsules (200 mg total) by mouth 2 (two) times daily.   hydrocortisone 25 MG suppository Commonly known as: ANUSOL-HC Place 1 suppository (25 mg total) rectally at bedtime.   lactulose 10 GM/15ML solution Commonly known as: CHRONULAC Take 15 mLs (10 g total) by mouth  daily. Start taking on: May 26, 2019   metoprolol tartrate 25 MG tablet Commonly known as: LOPRESSOR Take 0.5 tablets (12.5 mg total) by mouth 2 (two) times daily. What changed: how much to take   nitroGLYCERIN 0.4 MG SL tablet Commonly known as: NITROSTAT Place 1 tablet (0.4 mg total) under the tongue every 5 (five) minutes as needed for chest pain.   omeprazole 20 MG tablet Commonly known as: PRILOSEC OTC Take 1 tablet (20 mg total) by mouth daily.   polycarbophil 625 MG tablet Commonly known as: FIBERCON Take 625 mg by mouth daily.   spironolactone 25 MG tablet Commonly known as: ALDACTONE Take 0.5 tablets (12.5 mg total) by mouth daily. Start taking on: May 26, 2019            Durable Medical Equipment  (From admission, onward)         Start     Ordered   05/25/19 1152  For home use only DME Walker  Once    Question:  Patient needs a walker to treat with the following condition  Answer:  Cellulitis of right arm   05/25/19 1152   05/21/19 1214  For home use only DME 3 n 1  Once     05/21/19 1215   05/21/19 1214  For home use only DME Walker rolling  Once    Question:  Patient needs a walker to treat with the following condition  Answer:  Gait disturbance   05/21/19 1215         Follow-up Information    Charlott Rakes, MD. Schedule an appointment as soon as possible for a visit in 1 week(s).   Specialty: Family Medicine Contact information: [place] [address] [phone]        Pixie Casino, MD .   Specialty: Cardiology Contact information: [address] [place] [address] [phone]          Allergies  Allergen Reactions  . Penicillins Other (See Comments)    Childhood reaction Has patient had a PCN reaction causing immediate rash, facial/tongue/throat swelling, SOB or lightheadedness with hypotension: Unknown Has patient had a PCN reaction causing severe rash involving mucus  membranes or skin necrosis: Unknown Has patient had a PCN reaction that required hospitalization: No Has patient had a PCN reaction occurring within the last 10 years: No If all of the above answers are "NO", then may proceed with Cephalosporin use.     Consultations: Gastroenterology PCCM    Procedures/Studies: Dg Wrist Complete Right  Result Date: 05/20/2019 CLINICAL DATA:  Worsening right wrist pain.  No known injury. EXAM: RIGHT WRIST - COMPLETE 3+ VIEW COMPARISON:  None. FINDINGS: No fracture. The first metacarpal as subluxed in a palmar/radial direction in  relation to the trapezium. There is narrowing of the trapezium first metacarpal joint with subchondral sclerosis. Marginal osteophytes and radial sided soft tissue ossification is noted at the base of the first metacarpal and adjacent to the trapezium. Remaining wrist joints are normally spaced and aligned. Soft tissues are otherwise unremarkable. IMPRESSION: 1. No fracture or acute finding. 2. 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 No other degenerative/arthropathic change. Electronically Signed   By: Lajean Manes M.D.   On: 05/20/2019 13:48   Ct Forearm Right W Contrast  Result Date: 05/22/2019 CLINICAL DATA:  Wrist and forearm pain and swelling. EXAM: CT OF THE UPPER RIGHT EXTREMITY WITH CONTRAST TECHNIQUE: Multidetector CT imaging of the upper right extremity was performed according to the standard protocol following intravenous contrast administration. COMPARISON:  None. CONTRAST:  180mL OMNIPAQUE IOHEXOL 300 MG/ML  SOLN FINDINGS: Diffuse subcutaneous soft tissue swelling/edema/fluid suggesting cellulitis. No discrete drainable abscess is identified. No obvious changes of myositis or pyomyositis. A small elbow joint effusion is noted. Degenerative changes at the elbow but I do not see any destructive bony changes  to suggest septic arthritis. Moderate degenerative changes at the wrist without definite findings for septic arthritis or osteomyelitis. The radius and ulna are intact. Large right pleural effusion and large volume ascites. IMPRESSION: 1. Cellulitis and possible myositis but no definite discrete drainable soft tissue abscess or pyomyositis. 2. Elbow joint effusion without obvious destructive bony changes to suggest septic arthritis. 3. No findings suspicious for osteomyelitis. 4. If symptoms persist or worsen MR is recommended as a much better test for further evaluation. 5. Large right pleural effusion and large volume abdominal ascites. Electronically Signed   By: Marijo Sanes M.D.   On: 05/22/2019 20:10   Vas [place] Upper Extremity Venous Duplex  Result Date: 05/19/2019 UPPER VENOUS STUDY  Indications: Edema Limitations: Patient pain level/ keeping arm adducted at side. Comparison Study: 04/19/17 negative Performing Technologist: June Leap RDMS, RVT  Examination Guidelines: A complete evaluation includes B-mode imaging, spectral Doppler, color Doppler, and power Doppler as needed of all accessible portions of each vessel. Bilateral testing is considered an integral part of a complete examination. Limited examinations for reoccurring indications may be performed as noted.  Right Findings: +----------+------------+---------+-----------+----------+---------------------+ RIGHT     CompressiblePhasicitySpontaneousProperties       Summary        +----------+------------+---------+-----------+----------+---------------------+ IJV           Full       Yes       Yes                                    +----------+------------+---------+-----------+----------+---------------------+ Subclavian    Full       Yes       Yes                                    +----------+------------+---------+-----------+----------+---------------------+ Axillary      Full       Yes       Yes                                     +----------+------------+---------+-----------+----------+---------------------+ Brachial  Full       Yes       Yes                not all segments                                                             visualized       +----------+------------+---------+-----------+----------+---------------------+ Radial        Full                                                        +----------+------------+---------+-----------+----------+---------------------+ Ulnar                                                  Not visualized     +----------+------------+---------+-----------+----------+---------------------+ Cephalic      Full                                                        +----------+------------+---------+-----------+----------+---------------------+ Basilic                                                Not visualized     +----------+------------+---------+-----------+----------+---------------------+  Summary:  Right: No evidence of deep vein thrombosis in the upper extremity. No evidence of superficial vein thrombosis in the upper extremity. This was a limited study.  *See table(s) above for measurements and observations.  Diagnosing physician: Deitra Mayo MD Electronically signed by Deitra Mayo MD on 05/19/2019 at 5:59:59 PM.    Final        Subjective: Wants to go home , he reports he is tired of being in the hospital .  No complaints at this time.   Discharge Exam: Vitals:   05/25/19 0600 05/25/19 1303  BP: (!) 98/59 105/68  Pulse: 86 79  Resp: 18   Temp: 98.6 F (37 C) 98.8 F (37.1 C)  SpO2: 98% 98%   Vitals:   05/24/19 1500 05/24/19 2009 05/25/19 0600 05/25/19 1303  BP:  113/63 (!) 98/59 105/68  Pulse:  93 86 79  Resp:  18 18   Temp:  98.4 F (36.9 C) 98.6 F (37 C) 98.8 F (37.1 C)  TempSrc:  Oral Oral Oral  SpO2:  97% 98% 98%  Weight: 82.6 kg     Height:        General: Pt is alert,  awake, not in acute distress Cardiovascular: RRR, S1/S2 +, no rubs, no gallops Respiratory: CTA bilaterally, no wheezing, no rhonchi Abdominal: Soft, NT, ND, bowel sounds + Extremities: right upper extremity swelling and tenderness improving.     The results of significant diagnostics from this hospitalization (including imaging, microbiology, ancillary and laboratory)  are listed below for reference.     Microbiology: No results found for this or any previous visit (from the past 240 hour(s)).   Labs: BNP (last 3 results) No results for input(s): BNP in the last 8760 hours. Basic Metabolic Panel: Recent Labs  Lab 05/19/19 0334 05/21/19 1524 05/22/19 0337 05/23/19 0309 05/24/19 0839 05/25/19 0300  NA 133*  --  131* 133* 132* 132*  K 3.5  --  3.6 3.4* 3.3* 3.8  CL 104  --  101 102 101 102  CO2 21*  --  22 22 21* 23  GLUCOSE 106*  --  110* 136* 108* 110*  BUN 10  --  14 14 9 11   CREATININE 0.52*  --  0.65 0.61 0.54* 0.61  CALCIUM 8.2*  --  8.2* 8.1* 8.0* 8.2*  MG 1.2*  --  1.5* 1.5*  --   --   PHOS  --  3.9  --   --   --   --    Liver Function Tests: No results for input(s): AST, ALT, ALKPHOS, BILITOT, PROT, ALBUMIN in the last 168 hours. No results for input(s): LIPASE, AMYLASE in the last 168 hours. No results for input(s): AMMONIA in the last 168 hours. CBC: Recent Labs  Lab 05/19/19 0334 05/22/19 0337 05/23/19 0309 05/25/19 0916  WBC 8.5 9.3 8.0 8.2  NEUTROABS  --   --   --  6.5  HGB 8.0* 8.2* 7.8* 8.0*  HCT 25.1* 25.6* 24.9* 25.5*  MCV 105.0* 107.1* 107.8* 108.1*  PLT PLATELET CLUMPS NOTED ON SMEAR, COUNT APPEARS ADEQUATE PLATELET CLUMPS NOTED ON SMEAR, UNABLE TO ESTIMATE PLATELET CLUMPING, SUGGEST RECOLLECTION OF SAMPLE IN CITRATE TUBE. PLATELET CLUMPS NOTED ON SMEAR, UNABLE TO ESTIMATE   Cardiac Enzymes: No results for input(s): CKTOTAL, CKMB, CKMBINDEX, TROPONINI in the last 168 hours. BNP: Invalid input(s): POCBNP CBG: No results for input(s): GLUCAP  in the last 168 hours. D-Dimer No results for input(s): DDIMER in the last 72 hours. Hgb A1c No results for input(s): HGBA1C in the last 72 hours. Lipid Profile No results for input(s): CHOL, HDL, LDLCALC, TRIG, CHOLHDL, LDLDIRECT in the last 72 hours. Thyroid function studies No results for input(s): TSH, T4TOTAL, T3FREE, THYROIDAB in the last 72 hours.  Invalid input(s): FREET3 Anemia work up No results for input(s): VITAMINB12, FOLATE, FERRITIN, TIBC, IRON, RETICCTPCT in the last 72 hours. Urinalysis    Component Value Date/Time   COLORURINE AMBER (A) 04/01/2019 1336   APPEARANCEUR CLEAR 04/01/2019 1336   LABSPEC 1.011 04/01/2019 1336   PHURINE 6.0 04/01/2019 1336   GLUCOSEU NEGATIVE 04/01/2019 1336   GLUCOSEU NEGATIVE 08/29/2012 1520   HGBUR NEGATIVE 04/01/2019 [place] 04/01/2019 1336   KETONESUR 20 (A) 04/01/2019 1336   PROTEINUR NEGATIVE 04/01/2019 1336   UROBILINOGEN 0.2 08/29/2012 1520   NITRITE NEGATIVE 04/01/2019 1336   LEUKOCYTESUR NEGATIVE 04/01/2019 1336   Sepsis Labs Invalid input(s): PROCALCITONIN,  WBC,  LACTICIDVEN Microbiology No results found for this or any previous visit (from the past 240 hour(s)).   Time coordinating discharge: 36 minutes  SIGNED:   Hosie Poisson, MD  [facility] 05/25/2019, 2:33 PM Pager   If 7PM-7AM, please contact night-coverage [email or web address] Password [email or web address]

## 2019-06-07 ENCOUNTER — Emergency Department (HOSPITAL_COMMUNITY)
Admission: EM | Admit: 2019-06-07 | Discharge: 2019-06-19 | Disposition: E | Payer: Medicaid Other | Attending: Emergency Medicine | Admitting: Emergency Medicine

## 2019-06-07 DIAGNOSIS — I469 Cardiac arrest, cause unspecified: Secondary | ICD-10-CM | POA: Insufficient documentation

## 2019-06-07 DIAGNOSIS — Z20828 Contact with and (suspected) exposure to other viral communicable diseases: Secondary | ICD-10-CM | POA: Insufficient documentation

## 2019-06-07 LAB — CBC
HCT: 45 % (ref 39.0–52.0)
Hemoglobin: 13.2 g/dL (ref 13.0–17.0)
MCH: 34.2 pg — ABNORMAL HIGH (ref 26.0–34.0)
MCHC: 29.3 g/dL — ABNORMAL LOW (ref 30.0–36.0)
MCV: 116.6 fL — ABNORMAL HIGH (ref 80.0–100.0)
Platelets: UNDETERMINED 10*3/uL (ref 150–400)
RBC: 3.86 MIL/uL — ABNORMAL LOW (ref 4.22–5.81)
RDW: 16.4 % — ABNORMAL HIGH (ref 11.5–15.5)
WBC: 15.9 10*3/uL — ABNORMAL HIGH (ref 4.0–10.5)
nRBC: 0.4 % — ABNORMAL HIGH (ref 0.0–0.2)

## 2019-06-07 LAB — BASIC METABOLIC PANEL
Anion gap: 22 — ABNORMAL HIGH (ref 5–15)
BUN: 42 mg/dL — ABNORMAL HIGH (ref 6–20)
CO2: 7 mmol/L — ABNORMAL LOW (ref 22–32)
Calcium: 7.2 mg/dL — ABNORMAL LOW (ref 8.9–10.3)
Chloride: 103 mmol/L (ref 98–111)
Creatinine, Ser: 6.33 mg/dL — ABNORMAL HIGH (ref 0.61–1.24)
GFR calc Af Amer: 10 mL/min — ABNORMAL LOW (ref >60)
GFR calc non Af Amer: 9 mL/min — ABNORMAL LOW (ref >60)
Glucose, Bld: 115 mg/dL — ABNORMAL HIGH (ref 70–99)
Potassium: 5.9 mmol/L — ABNORMAL HIGH (ref 3.5–5.1)
Sodium: 132 mmol/L — ABNORMAL LOW (ref 135–145)

## 2019-06-07 LAB — LACTIC ACID, PLASMA: Lactic Acid, Venous: >11 mmol/L (ref 0.5–1.9)

## 2019-06-07 LAB — POC SARS CORONAVIRUS 2 AG -  ED: SARS Coronavirus 2 Ag: NEGATIVE

## 2019-06-07 LAB — TROPONIN I (HIGH SENSITIVITY): Troponin I (High Sensitivity): 102 ng/L (ref <18)

## 2019-06-07 LAB — POC SARS CORONAVIRUS 2 AG
SARS Coronavirus 2 Ag: NEGATIVE
SARS Coronavirus 2 Ag: NEGATIVE

## 2019-06-07 LAB — MAGNESIUM: Magnesium: 2.3 mg/dL (ref 1.7–2.4)

## 2019-06-07 LAB — CBG MONITORING, ED: Glucose-Capillary: 121 mg/dL — ABNORMAL HIGH (ref 70–99)

## 2019-06-07 MED ORDER — ROCURONIUM BROMIDE 50 MG/5ML IV SOLN
INTRAVENOUS | Status: AC | PRN
  Administered 2019-06-07: 100 mg via INTRAVENOUS

## 2019-06-07 MED ORDER — NOREPINEPHRINE BITARTRATE 1 MG/ML IV SOLN
INTRAVENOUS | Status: AC | PRN
  Administered 2019-06-07: 4 mg via INTRAVENOUS

## 2019-06-07 MED ORDER — EPINEPHRINE 1 MG/10ML IJ SOSY
PREFILLED_SYRINGE | INTRAMUSCULAR | Status: AC | PRN
  Administered 2019-06-07: 1 mg via INTRAVENOUS

## 2019-06-07 MED ORDER — SODIUM BICARBONATE 8.4 % IV SOLN
INTRAVENOUS | Status: AC | PRN
  Administered 2019-06-07: 50 meq via INTRAVENOUS

## 2019-06-07 MED FILL — Medication: Qty: 1 | Status: AC

## 2019-06-19 NOTE — Progress Notes (Signed)
Responded to ED page . Spoke with patient nurse and was informed that Mr. Al had passed and that family needed to be notified.  I first called sister  who informed me that she was out of state and that I needed to call his signifcant other.  I spoke with girlfriend and asked her to come to Hospital.  She said she was leaving right away.  This information was shared with attending nurse.  Chaplain available as needed.  Jaclynn Major, [place], [facility], Pager [phone]

## 2019-06-19 NOTE — Code Documentation (Addendum)
Patient brought in by EMS. Code witnessed by EMS. Pt was sitting on the couch diaphoretic and pale. EMS worked the code for 45 minutes prior to arrival. Humeral IO in right shoulder with pressure bag, Tibial IO on right leg, 1 liter of fluids. 10 epis given pta

## 2019-06-19 NOTE — ED Provider Notes (Addendum)
[facility] Emergency Department Provider Note MRN:  [id number]  Arrival date & time: Jun 22, 2019     Chief Complaint   Cardiac arrest History of Present Illness   Richard Calhoun is a 58 y.o. year-old male with a history of CAD presenting to the ED with chief complaint of cardiac arrest.  EMS reports 1 or 2 days of diarrheal illness, called to the home where patient was in distress, mottled, severely ill-appearing, and then shortly after their arrival he became unresponsive and pulseless.  Review of Systems  Positive for cardiac arrest, diarrhea.  Patient's Health History    Past Medical History:  Diagnosis Date  . Acute renal failure ([place])   . Alcoholic hepatitis without ascites   . ALLERGIC RHINITIS   . Anemia   . Anginal pain ([place])   . ANXIETY   . BICEPS TENDON RUPTURE, RIGHT   . CAD S/P percutaneous coronary angioplasty 03/2017   Cath: mRCA 99-100% CTO (calcific w/ L-R collaterals), p-mLAD calcified 60-80% (FFR 0.74 after D2), 50% mCx. --> Staged Atherectomy-DES PCI of p-mLAD (Synergy DES 3.0 x 24 -- 3.3 mm)  . Chronic diastolic CHF (congestive heart failure) ([place]) 04/07/2019  . DEPENDENCE, ALCOHOL NEC/NOS, UNSPECIFIED   . DEPRESSION   . GERD   . Headache   . HYPERLIPIDEMIA   . HYPERTENSION   . Impaired glucose tolerance   . OSTEOARTHRITIS   . Tubular adenoma of colon 06/2006    Past Surgical History:  Procedure Laterality Date  . COLONOSCOPY W/ POLYPECTOMY  06/2006   Dr Fuller Plan.  For hematochezia. 5 mm, sessile polyp (tubular adenoma, no HGD).  injected saline to internal hemorrhoids.  Did not respond to fup 2012, 2014 colonoscopy.    . CORONARY ANGIOPLASTY    . CORONARY ATHERECTOMY N/A 04/20/2017   Wellington Hampshire, MD;  p-mLAD (Orbital)  . CORONARY STENT INTERVENTION N/A 04/20/2017   Wellington Hampshire, MD;  Location: [facility].  Staged Orbital Atherectomy-DES PCI of p-mLAD (Synergy DES 3.0 x 24 -- 3.3 mm)  . ESOPHAGOGASTRODUODENOSCOPY  2000   Dr  Laurence Spates.  Hiatal hernia.  Marland Kitchen ESOPHAGOGASTRODUODENOSCOPY (EGD) WITH PROPOFOL N/A 04/04/2019   Procedure: ESOPHAGOGASTRODUODENOSCOPY (EGD) WITH PROPOFOL;  Surgeon: Rush Landmark Telford Nab., MD;  Location: [facility];  Service: Gastroenterology;  Laterality: N/A;  bedside-on vent  . FOOT SURGERY    . INTRAVASCULAR PRESSURE WIRE/FFR STUDY N/A 04/15/2017   Procedure: INTRAVASCULAR PRESSURE WIRE/FFR STUDY; Belva Crome, MD; LAD = 0.74 after D2  . LEFT HEART CATH AND CORONARY ANGIOGRAPHY N/A 04/15/2017  . MANDIBLE FRACTURE SURGERY    . TRANSTHORACIC ECHOCARDIOGRAM  01/2017   Moderate LVH.  EF 60 to 65%.  No R WMA.  GR 1 DD. Mild AI.     Family History  Problem Relation Age of Onset  . Kidney disease Sister   . Stroke Mother   . Stroke Father     Social History   Socioeconomic History  . Marital status: Single    Spouse name: Not on file  . Number of children: Not on file  . Years of education: Not on file  . Highest education level: Not on file  Occupational History  . Occupation: unemployed  Social Needs  . Financial resource strain: Not on file  . Food insecurity    Worry: Not on file    Inability: Not on file  . Transportation needs    Medical: Not on file    Non-medical: Not on file  Tobacco Use  .  Smoking status: Former Smoker    Types: E-cigarettes    Quit date: 2017    Years since quitting: 3.8  . Smokeless tobacco: Never Used  . Tobacco comment: states he is smoking 1 pack 1/2 now that he is unemployed   Substance and Sexual Activity  . Alcohol use: Yes    Alcohol/week: 0.0 standard drinks  . Drug use: No  . Sexual activity: Not on file  Lifestyle  . Physical activity    Days per week: Not on file    Minutes per session: Not on file  . Stress: Not on file  Relationships  . Social Herbalist on phone: Not on file    Gets together: Not on file    Attends religious service: Not on file    Active member of club or organization: Not on file     Attends meetings of clubs or organizations: Not on file    Relationship status: Not on file  . Intimate partner violence    Fear of current or ex partner: Not on file    Emotionally abused: Not on file    Physically abused: Not on file    Forced sexual activity: Not on file  Other Topics Concern  . 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   Alcoholic   Smoker   No drugs     Physical Exam  Vital Signs and Nursing Notes reviewed Vitals:   06/27/2019 0838  BP: (!) 146/93  Pulse: 60    CONSTITUTIONAL:  ill-appearing NEURO:  Unresponsive with intermittent movements without purpose EYES:  eyes equal and reactive ENT/NECK:  no LAD, no JVD CARDIO:  Mottled, poorly perfused PULM:  CTAB no wheezing or rhonchi GI/GU:  normal bowel sounds, non-distended, non-tender MSK/SPINE:  No gross deformities, no edema SKIN:  no rash, atraumatic PSYCH:  Unable to assess  Diagnostic and Interventional Summary    EKG Interpretation  Date/Time:  2019/06/27 08:30:47 EST Ventricular Rate:  57 PR Interval:    QRS Duration: 91 QT Interval:  445 QTC Calculation: 434 R Axis:   105 Text Interpretation: Sinus rhythm Lateral infarct, old Probable anteroseptal infarct, old Confirmed by Gerlene Fee [phone]) on 06/27/19 8:52:44 AM      Labs Reviewed  CBG MONITORING, ED - Abnormal; Notable for the following components:      Result Value   Glucose-Capillary 121 (*)    All other components within normal limits  BASIC METABOLIC PANEL  CBC  LACTIC ACID, PLASMA  MAGNESIUM  TROPONIN I (HIGH SENSITIVITY)    No orders to display    Medications  sodium bicarbonate injection (50 mEq Intravenous Given 2019-06-27 0827)  norepinephrine (LEVOPHED) injection (4 mg Intravenous Given Jun 27, 2019 0834)  EPINEPHrine (ADRENALIN) 1 MG/10ML  injection (1 mg Intravenous Given 06-27-2019 0840)     Procedures  /  Critical Care .Critical Care Performed by: Maudie Flakes, MD Authorized by: Maudie Flakes, MD   Critical care provider statement:    Critical care time (minutes):  33   Critical care time was exclusive of:  Separately billable procedures and treating other patients   Critical care was necessary to treat or prevent imminent or life-threatening deterioration of the following conditions:  Circulatory failure (Cardiac arrest)   Critical care was time spent personally  by me on the following activities:  Discussions with consultants, evaluation of patient's response to treatment, examination of patient, ordering and performing treatments and interventions, pulse oximetry, re-evaluation of patient's condition, obtaining history from patient or surrogate and review of old charts Procedure Name: Intubation Date/Time: 06-28-2019 9:01 AM Performed by: Maudie Flakes, MD Pre-anesthesia Checklist: Patient identified, Patient being monitored, Emergency Drugs available, Timeout performed and Suction available Oxygen Delivery Method: Non-rebreather mask Preoxygenation: Pre-oxygenation with 100% oxygen Induction Type: Rapid sequence Ventilation: Mask ventilation without difficulty Laryngoscope Size: Glidescope and 4 Grade View: Grade I Tube size: 7.5 mm Number of attempts: 1 Airway Equipment and Method: Rigid stylet Placement Confirmation: ETT inserted through vocal cords under direct vision,  CO2 detector and Breath sounds checked- equal and bilateral Secured at: 23 cm    Ultrasound ED Echo  Date/Time: 28-Jun-2019 8:25 AM Performed by: Maudie Flakes, MD Authorized by: Maudie Flakes, MD   Procedure details:    Indications: cardiac arrest     Views: subxiphoid and apical 4 chamber view     Limitations:  Body habitus Findings:    Pericardium: small pericardial effusion     Cardiac Activity: normal cardiac activity and  agonal     RV Diameter: dilated   Impression:    Impression: abnormal cardiac activity   Ultrasound ED Echo  Date/Time: 06-28-2019 8:44 AM Performed by: Maudie Flakes, MD Authorized by: Maudie Flakes, MD   Procedure details:    Indications: cardiac arrest     Views: subxiphoid and apical 4 chamber view     Limitations:  Body habitus Findings:    Pericardium: small pericardial effusion     Cardiac Activity: no cardiac activity     RV Diameter: dilated   Impression:    Impression comment:  Complete cardiac standstill Ultrasound ED Soft Tissue  Date/Time: 2019/06/28 8:33 AM Performed by: Maudie Flakes, MD Authorized by: Maudie Flakes, MD   Procedure details:    Indications comment:  Evaluation for presence of pulse   Transverse view:  Visualized Location:    Location comment:  Right femoral artery Comments:     Pulsatile activity noted.    ED Course and Medical Decision Making  I have reviewed the triage vital signs and the nursing notes.  Pertinent labs & imaging results that were available during my care of the patient were reviewed by me and considered in my medical decision making (see below for details).     Cardiac arrest after diarrheal illness, multiple comorbidities, question of arrhythmia due to electrolyte imbalance.  Poor prognosticating factors prior to arrival such as CPR for 45 minutes, PEA arrest the entire time, 10 rounds of epinephrine.  On arrival it is clear that CPR with [facility] device has been effective as he has some nonpurposeful movements while on the [place], which diminished during pulse/rhythm check.  Patient is very ill-appearing, mottled, very cold to the touch.  He remained without pulse and CPR was continued with Linton Rump device.  Bedside ultrasound revealed little to no cardiac activity.  Given the continued movements, ultrasound of the femoral artery was performed during the next pulse/rhythm check, which demonstrated pulsatile activity.   Could not be palpated but could be visualized with ultrasound.  This was thought to represent return of spontaneous circulation.  He had a blood pressure of [id number] systolic, heart rate [id number], EKG without any evidence of ischemia, nothing to warrant Cath Lab especially given the history of diarrheal and possibly infectious illness.  Definitive airway obtained as described above.  Quick recheck of the femoral artery with ultrasound revealed that this pulsatile activity had stopped.  CPR was restarted.  Given additional milligram epinephrine, amp of bicarb.  After this 2-minute round, bedside cardiac ultrasound was again performed, this time revealing complete cardiac standstill.  Time of death called at 8:44 AM.    Barth Kirks. Sedonia Small, [place] [email or web address]  Final Clinical Impressions(s) / ED Diagnoses     ICD-10-CM   1. Cardiac arrest [facility])  I46.9     ED Discharge Orders    None       Discharge Instructions Discussed with and Provided to Patient:   Discharge Instructions   None       Maudie Flakes, MD 11-Jun-2019 [id number]    Maudie Flakes, MD 2019/06/11 [phone]

## 2019-06-19 NOTE — Code Documentation (Signed)
Pulse check: no pulse 

## 2019-06-19 NOTE — Code Documentation (Addendum)
Time of death verified via ultrasound by Dr. Sedonia Small

## 2019-06-19 NOTE — Code Documentation (Signed)
Patient time of death occurred at 18.

## 2019-06-19 DEATH — deceased

## 2019-09-05 LAB — BLOOD GAS, ARTERIAL
Delivery systems: POSITIVE
Drawn by: 560031
Expiratory PAP: 5
FIO2: 40
Inspiratory PAP: 10
Mode: POSITIVE
O2 Saturation: 99 %
Patient temperature: 98.6
RATE: 8 resp/min
pH, Arterial: 7.351 (ref 7.350–7.450)
pO2, Arterial: 199 mmHg — ABNORMAL HIGH (ref 83.0–108.0)

## 2020-05-04 IMAGING — RF DG INTRO LONG GI TUBE
1 series · 1 of 1 positions shown · IV contrast (omnipaque)
Comparison: None.

CLINICAL DATA: 58-year-old male with history of hepatic
encephalopathy in need of enteral access for feeding.

EXAM:
INTRO LONG GI TUBE
CONTRAST:  50 mL of Omnipaque 300.
FLUOROSCOPY TIME:  Fluoroscopy Time:  3 minutes and 12 seconds
Radiation Exposure Index (if provided by the fluoroscopic device):
23.9 mGy
Number of Acquired Spot Images: 0

[Series 2: cp_standard · 0.26mm/px · 1 of 1 slices shown]
[im 1/1]
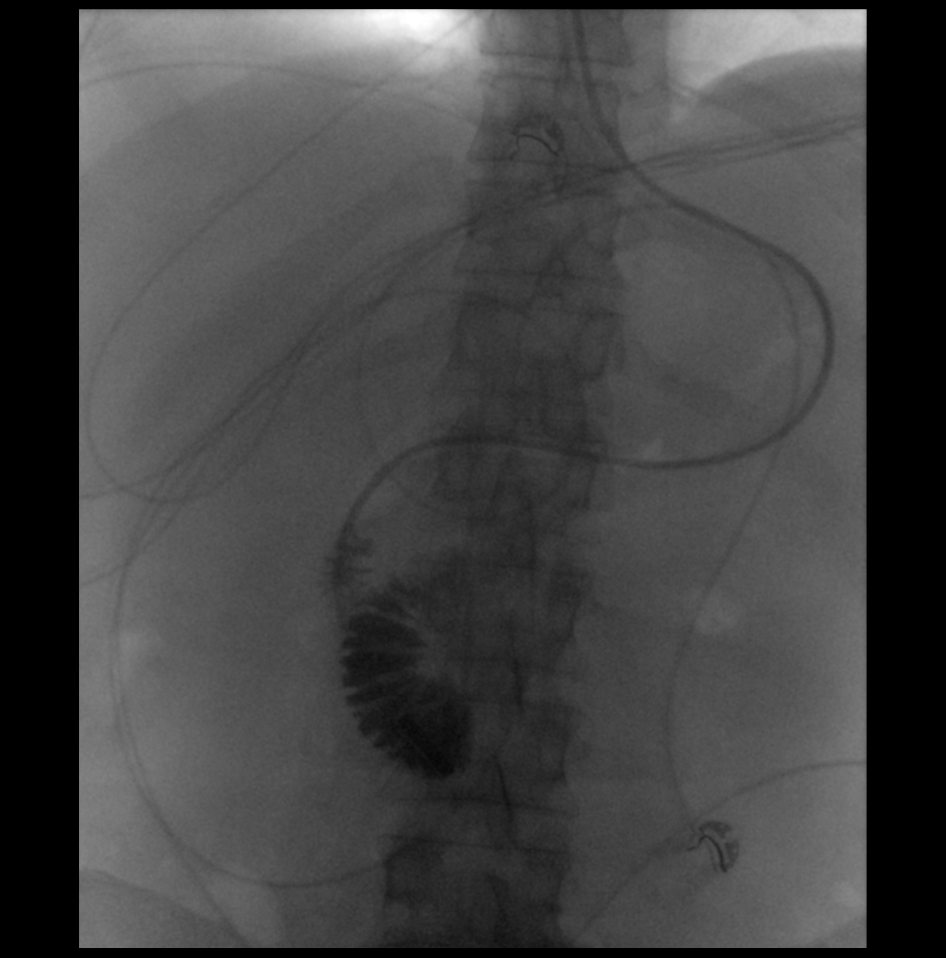

[1 of 1 positions shown; findings below may reference images not displayed]

FINDINGS: Fluoroscopy was utilized to pass a small bore feeding tube into the
third portion of the duodenum. Injection of Omnipaque via the tube
confirm proper placement of the tip of the tube in the proximal
aspect of the third portion of the duodenum.
IMPRESSION: 1. Successful fluoroscopic guided placement of feeding tube with tip
in the third portion of the duodenum.

## 2020-05-05 IMAGING — DX DG CHEST 1V PORT
1 series · 1 of 1 positions shown · non-contrast
Comparison: Radiographs April 09, 2019.

CLINICAL DATA: Dyspnea.

EXAM:
PORTABLE CHEST 1 VIEW

[chest ap]
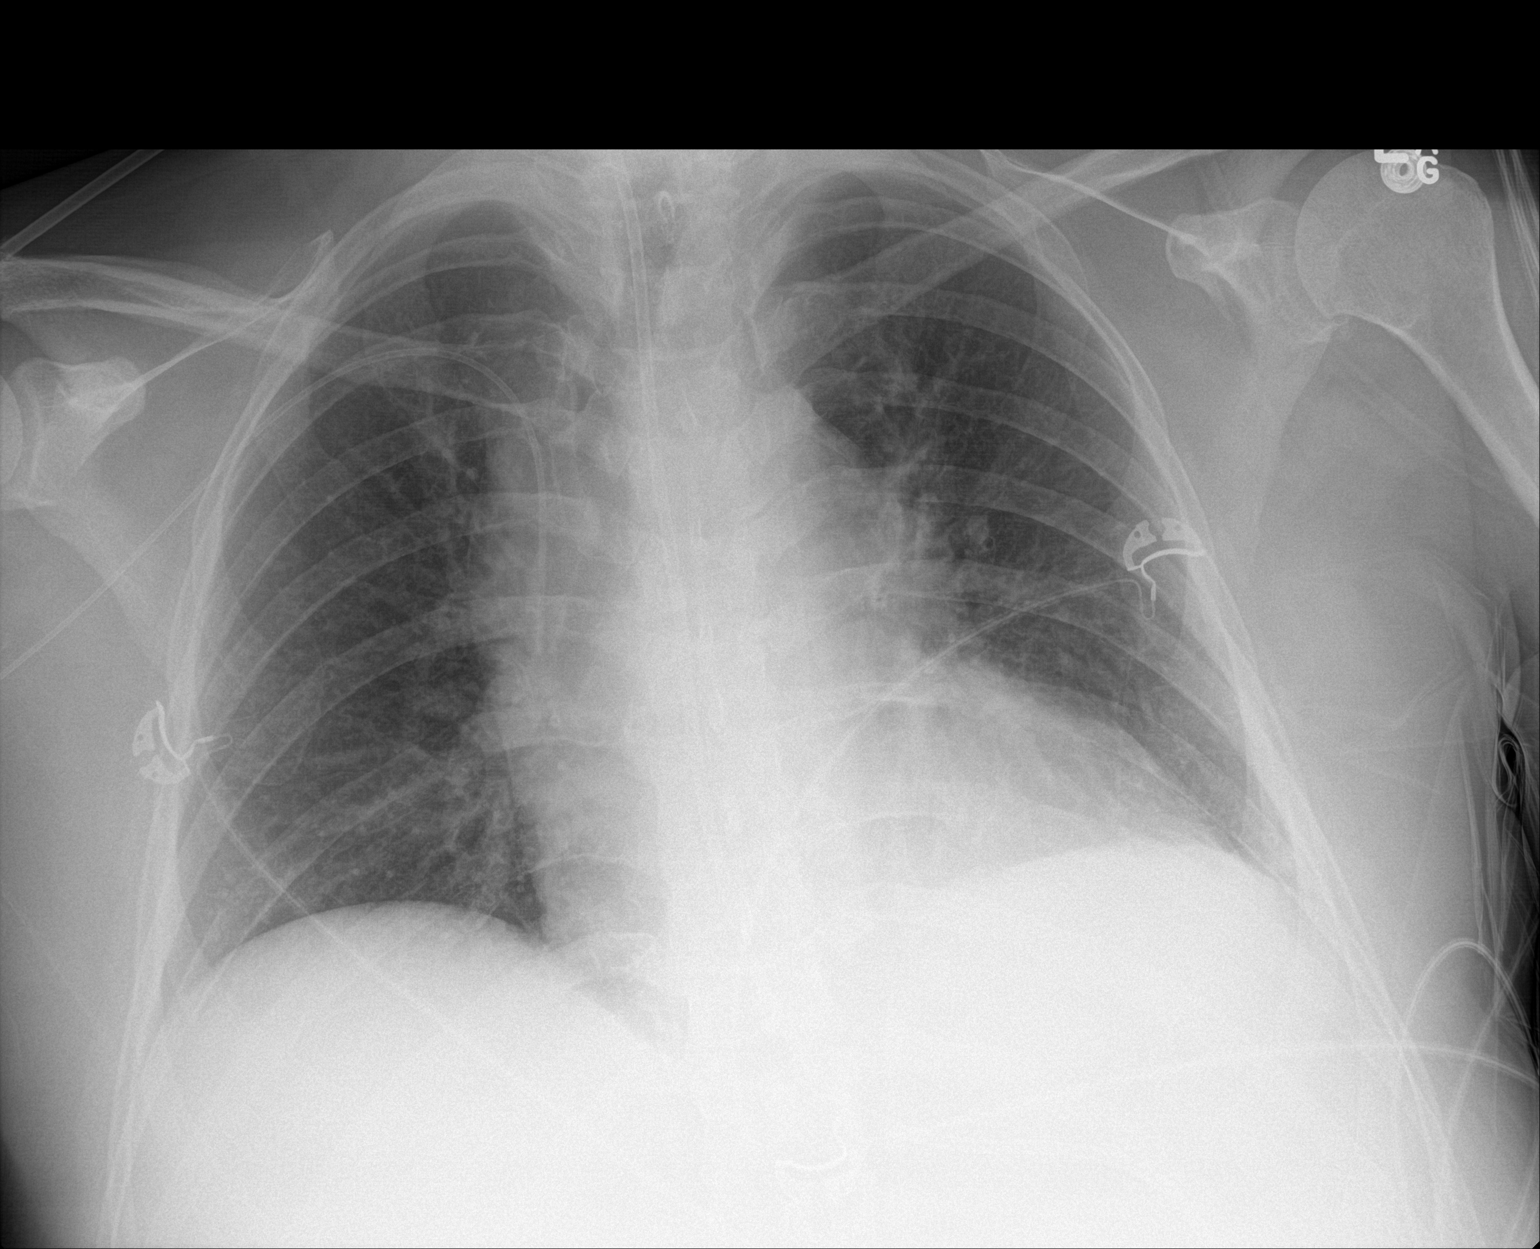

[1 of 1 positions shown; findings below may reference images not displayed]

FINDINGS: Stable cardiomediastinal silhouette. Feeding tube is seen entering
the stomach. Right-sided PICC line is unchanged in position. No
pneumothorax or pleural effusion is noted. No acute pulmonary
disease is noted. Bony thorax is unremarkable.
IMPRESSION: No active disease.

## 2020-05-05 IMAGING — DX DG ABDOMEN 1V
1 series · 1 of 1 positions shown · non-contrast
Comparison: Earlier today

CLINICAL DATA: Feeding tube

EXAM:
ABDOMEN - 1 VIEW

[abdomen kub]
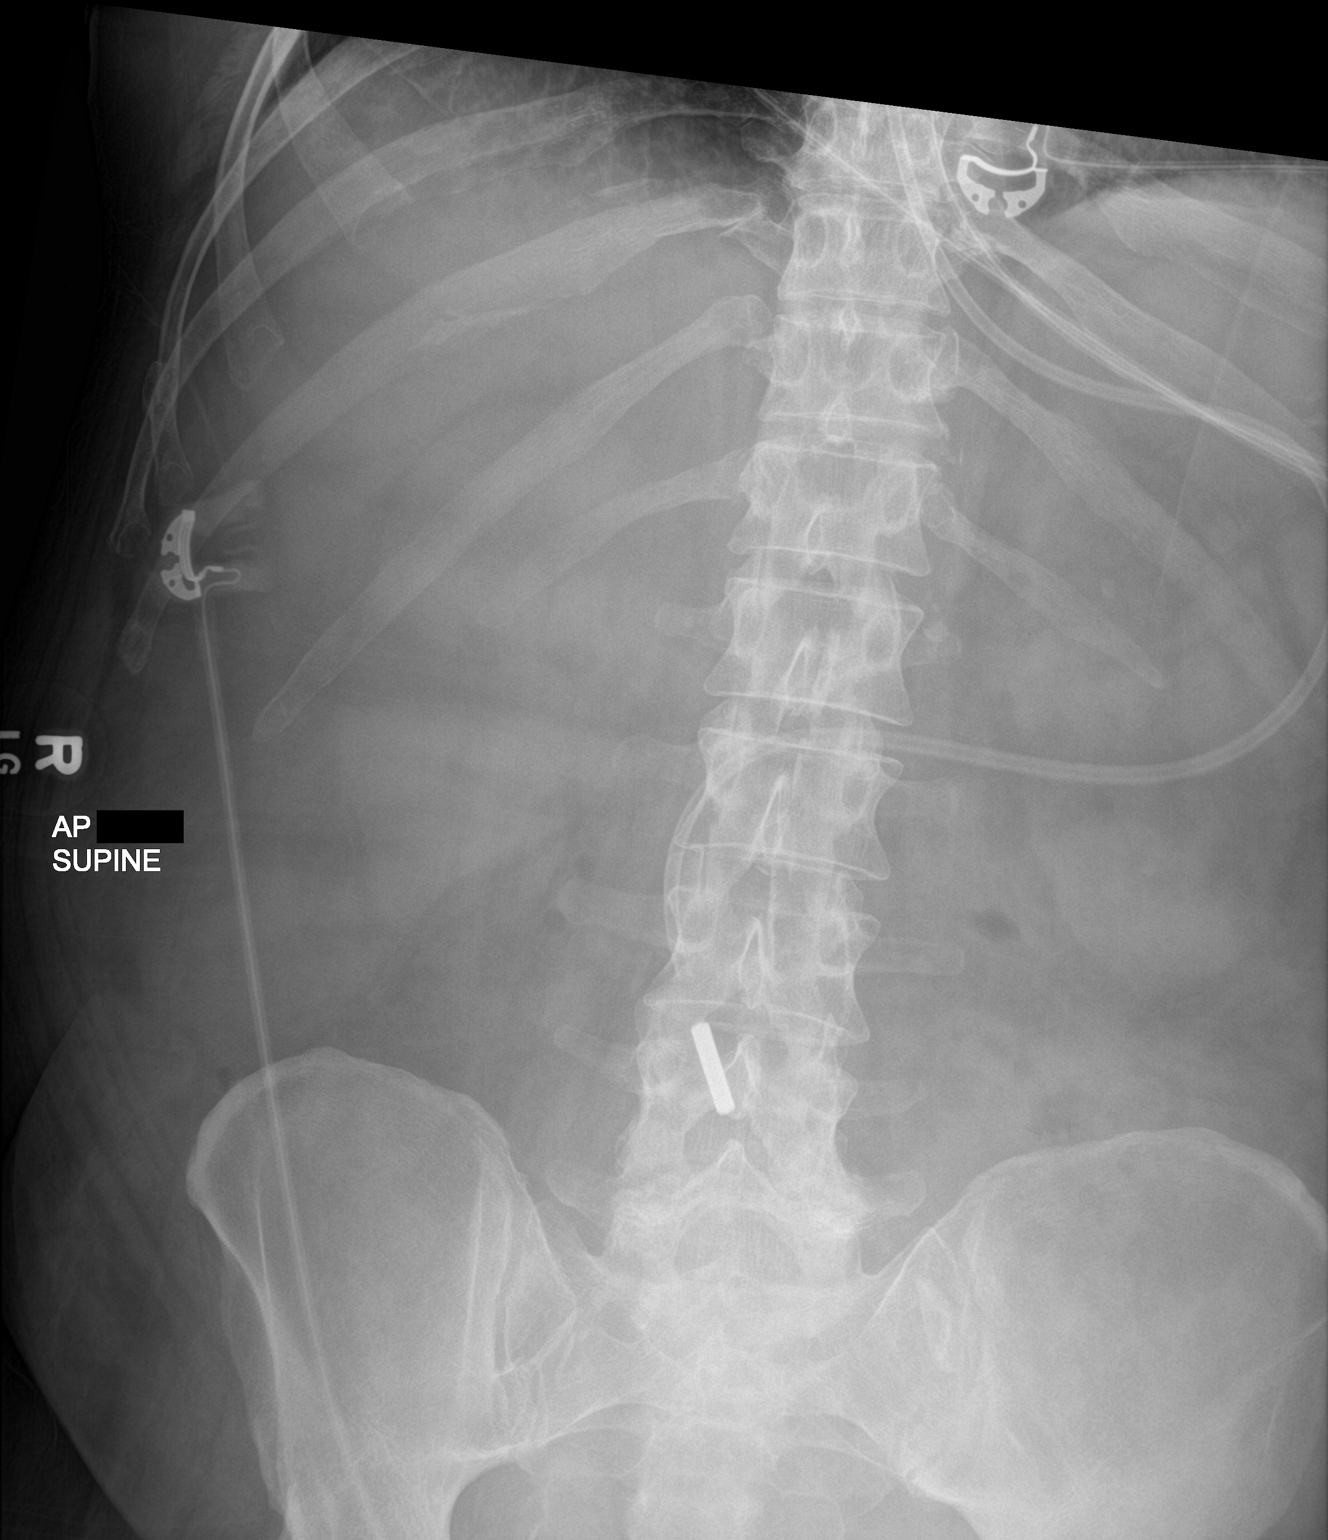

[1 of 1 positions shown; findings below may reference images not displayed]

FINDINGS: Feeding tube tip is in the mid duodenum with slight advancement from
earlier. Normal bowel gas pattern with relative paucity of gas.
Stone like appearance of the upper pole left kidney is pancreatic by
recent abdominal CT.
IMPRESSION: Post pyloric feeding tube tip.

## 2020-05-05 IMAGING — DX DG ABDOMEN 1V
1 series · 1 of 1 positions shown · non-contrast
Comparison: Abdominal radiograph 04/09/2019

CLINICAL DATA: Feeding tube noticed by RN when performing oral
care, to be partially coiled in the back of the throat near pt's
uvula. Pt has thick secretions and has been coughing consistently.
Wanting to verify placement of Core Trek before continuing on with
ordered continuous tube feedings.

EXAM:
ABDOMEN - 1 VIEW

[abdomen kub]
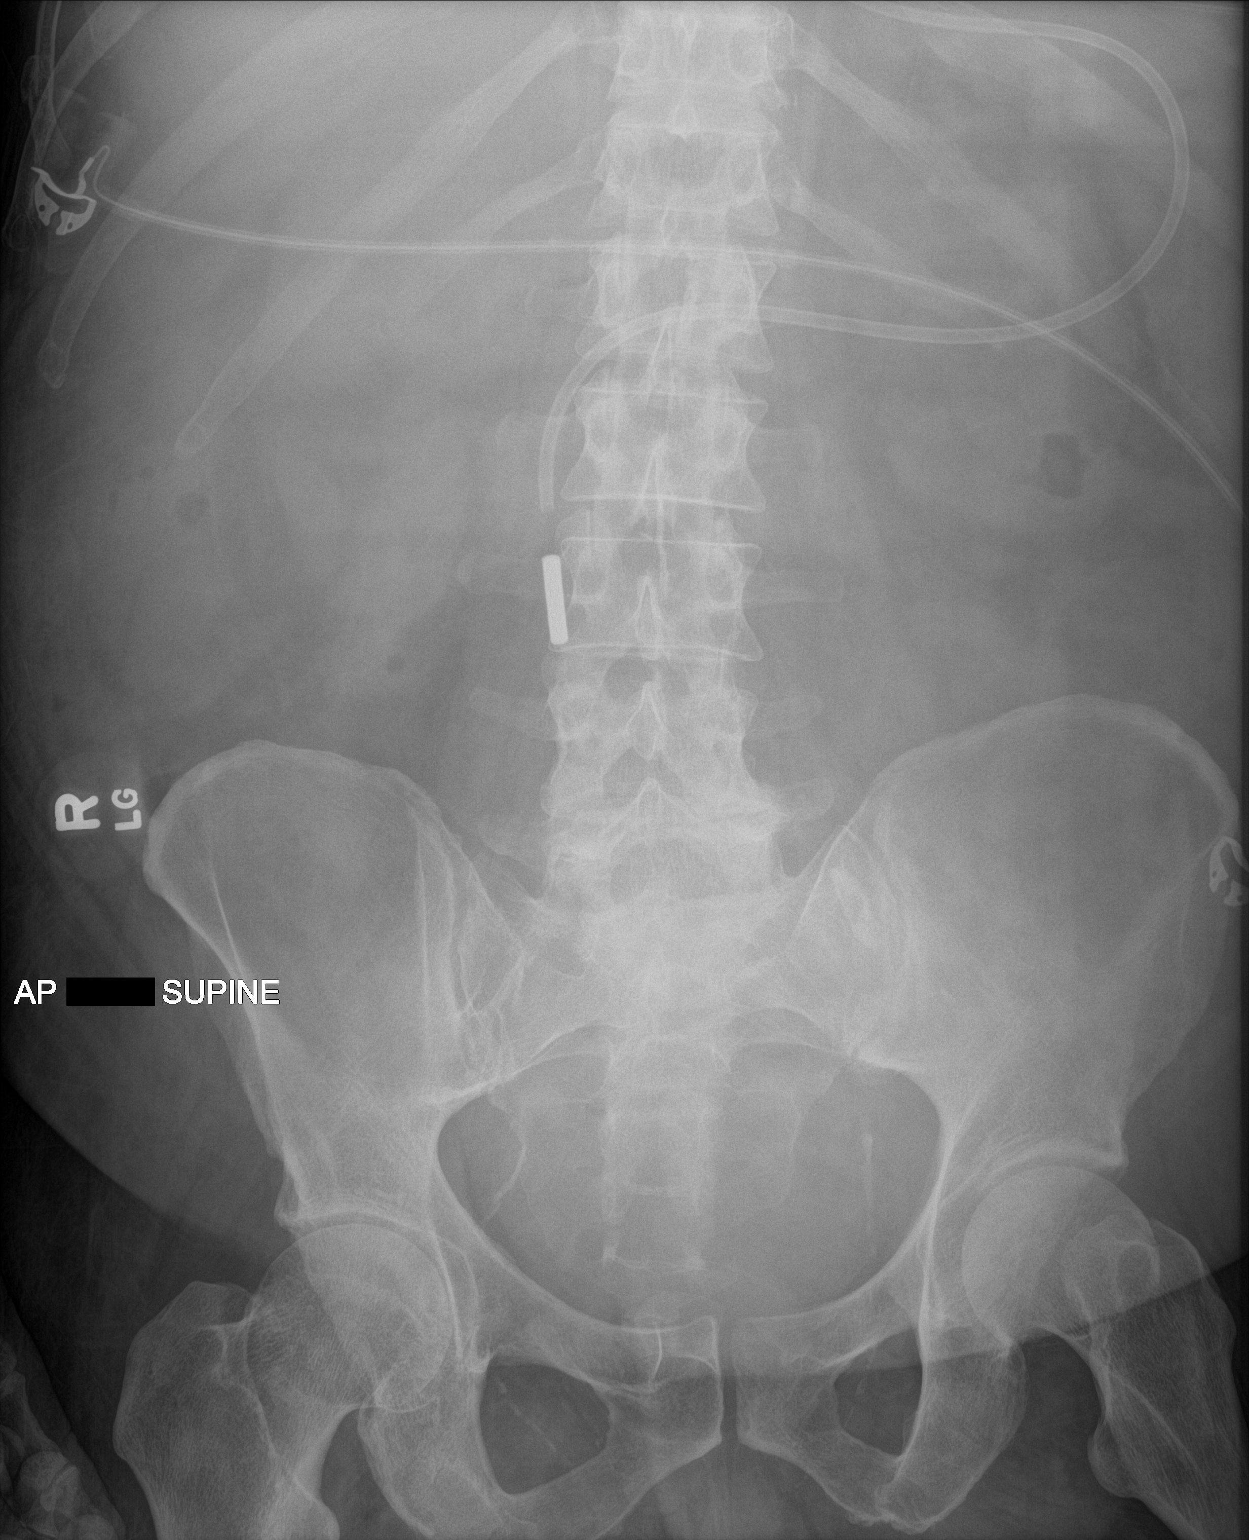

[1 of 1 positions shown; findings below may reference images not displayed]

FINDINGS: Tip of the weighted enteric tube in the [DATE] portion of the duodenum.
No evidence of free air. Generalized paucity of bowel gas without
gaseous bowel distension. Calcifications in the pelvis are likely
vascular.
IMPRESSION: Tip of the weighted enteric tube in the 2nd/3rd portion of the
duodenum.

## 2020-05-09 IMAGING — DX DG CHEST 1V PORT
1 series · 1 of 1 positions shown · non-contrast
Comparison: 04/14/2019

CLINICAL DATA: Wheezing.

EXAM:
PORTABLE CHEST 1 VIEW

[chest ap]
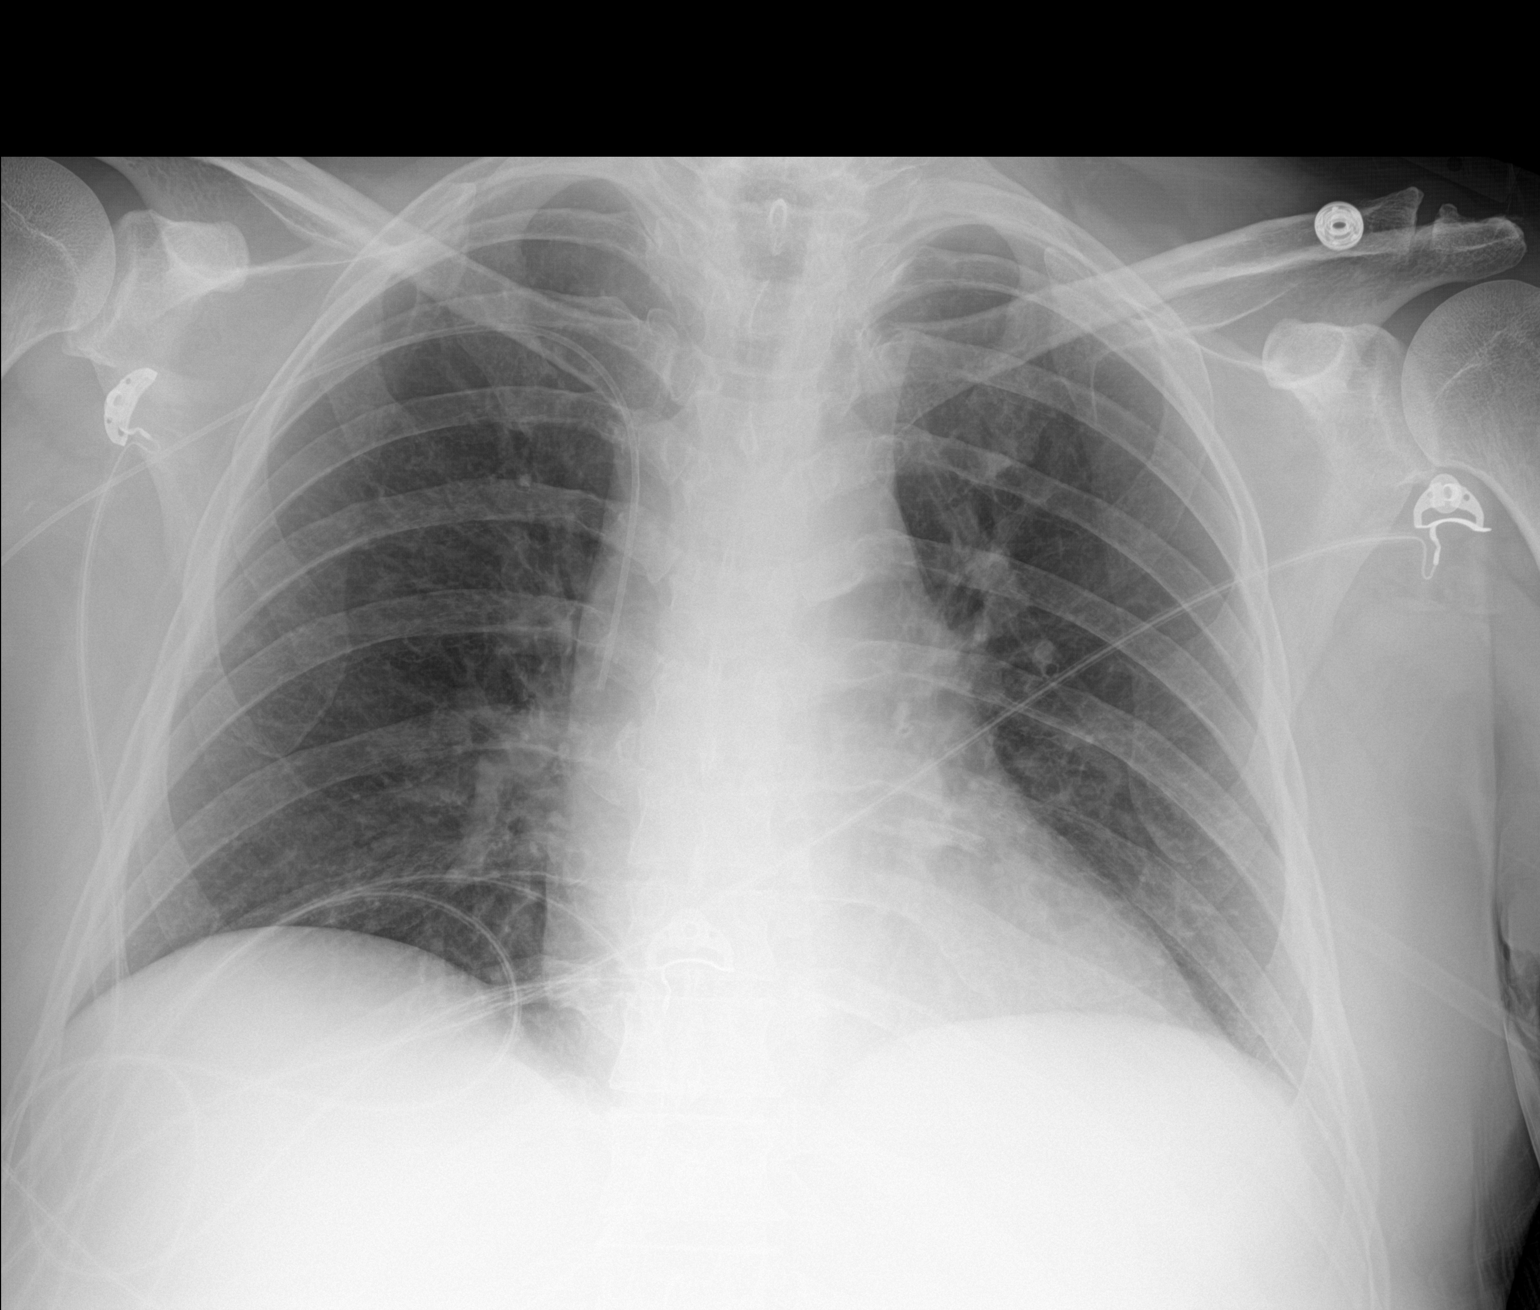

[1 of 1 positions shown; findings below may reference images not displayed]

FINDINGS: 2337 hours. The lungs are clear without focal pneumonia, edema,
pneumothorax or pleural effusion. The cardiopericardial silhouette
is within normal limits for size. Right PICC line tip overlies the
mid to distal SVC. The visualized bony structures of the thorax are
intact. Telemetry leads overlie the chest.
IMPRESSION: Stable.  No acute findings.
